# Patient Record
Sex: Male | Born: 1960 | Race: Black or African American | Hispanic: No | Marital: Married | Smoking: Never smoker
Health system: Southern US, Community
[De-identification: ages and names within clinical notes are randomized; demographics above are authoritative.]

## PROBLEM LIST (undated history)

## (undated) DIAGNOSIS — I10 Essential (primary) hypertension: Secondary | ICD-10-CM

## (undated) DIAGNOSIS — G4730 Sleep apnea, unspecified: Secondary | ICD-10-CM

## (undated) DIAGNOSIS — F32A Depression, unspecified: Secondary | ICD-10-CM

## (undated) DIAGNOSIS — K625 Hemorrhage of anus and rectum: Secondary | ICD-10-CM

## (undated) DIAGNOSIS — I639 Cerebral infarction, unspecified: Secondary | ICD-10-CM

## (undated) DIAGNOSIS — K219 Gastro-esophageal reflux disease without esophagitis: Secondary | ICD-10-CM

## (undated) DIAGNOSIS — F419 Anxiety disorder, unspecified: Secondary | ICD-10-CM

## (undated) DIAGNOSIS — C801 Malignant (primary) neoplasm, unspecified: Secondary | ICD-10-CM

## (undated) DIAGNOSIS — K626 Ulcer of anus and rectum: Secondary | ICD-10-CM

## (undated) DIAGNOSIS — F329 Major depressive disorder, single episode, unspecified: Secondary | ICD-10-CM

## (undated) HISTORY — PX: HERNIA REPAIR: SHX51

---

## 2011-08-27 ENCOUNTER — Other Ambulatory Visit: Payer: Self-pay | Admitting: Medical

## 2013-06-09 ENCOUNTER — Ambulatory Visit (HOSPITAL_COMMUNITY)
Admission: RE | Admit: 2013-06-09 | Discharge: 2013-06-09 | Disposition: A | Discharge status: Home / Self Care | Payer: BC Managed Care – PPO | Source: Ambulatory Visit | Attending: Family Medicine | Admitting: Family Medicine

## 2013-06-09 ENCOUNTER — Other Ambulatory Visit (HOSPITAL_COMMUNITY): Payer: Self-pay | Admitting: Family Medicine

## 2013-06-09 DIAGNOSIS — M545 Low back pain: Secondary | ICD-10-CM

## 2013-06-09 DIAGNOSIS — M5137 Other intervertebral disc degeneration, lumbosacral region: Secondary | ICD-10-CM | POA: Diagnosis not present

## 2013-06-09 DIAGNOSIS — M51379 Other intervertebral disc degeneration, lumbosacral region without mention of lumbar back pain or lower extremity pain: Secondary | ICD-10-CM | POA: Insufficient documentation

## 2013-06-09 DIAGNOSIS — M47817 Spondylosis without myelopathy or radiculopathy, lumbosacral region: Secondary | ICD-10-CM | POA: Diagnosis not present

## 2014-08-11 DIAGNOSIS — C801 Malignant (primary) neoplasm, unspecified: Secondary | ICD-10-CM

## 2014-08-11 HISTORY — DX: Malignant (primary) neoplasm, unspecified: C80.1

## 2016-05-07 ENCOUNTER — Other Ambulatory Visit: Payer: Self-pay | Admitting: Urology

## 2016-05-07 ENCOUNTER — Ambulatory Visit (INDEPENDENT_AMBULATORY_CARE_PROVIDER_SITE_OTHER): Payer: No Typology Code available for payment source | Admitting: Urology

## 2016-05-07 DIAGNOSIS — R972 Elevated prostate specific antigen [PSA]: Secondary | ICD-10-CM

## 2016-05-07 DIAGNOSIS — R350 Frequency of micturition: Secondary | ICD-10-CM

## 2016-05-28 ENCOUNTER — Encounter (HOSPITAL_COMMUNITY): Payer: Self-pay

## 2016-05-28 ENCOUNTER — Ambulatory Visit (HOSPITAL_COMMUNITY)
Admission: RE | Admit: 2016-05-28 | Discharge: 2016-05-28 | Disposition: A | Discharge status: Home / Self Care | Payer: No Typology Code available for payment source | Source: Ambulatory Visit | Attending: Urology | Admitting: Urology

## 2016-05-28 DIAGNOSIS — R972 Elevated prostate specific antigen [PSA]: Secondary | ICD-10-CM | POA: Insufficient documentation

## 2016-05-28 DIAGNOSIS — C61 Malignant neoplasm of prostate: Secondary | ICD-10-CM | POA: Diagnosis not present

## 2016-05-28 HISTORY — DX: Depression, unspecified: F32.A

## 2016-05-28 HISTORY — DX: Anxiety disorder, unspecified: F41.9

## 2016-05-28 HISTORY — DX: Major depressive disorder, single episode, unspecified: F32.9

## 2016-05-28 HISTORY — DX: Essential (primary) hypertension: I10

## 2016-05-28 MED ORDER — LIDOCAINE HCL (PF) 2 % IJ SOLN
10.0000 mL | Freq: Once | INTRAMUSCULAR | Status: AC
  Administered 2016-05-28: 10 mL

## 2016-05-28 MED ORDER — LIDOCAINE HCL (PF) 2 % IJ SOLN
INTRAMUSCULAR | Status: AC
  Administered 2016-05-28: 10 mL
  Filled 2016-05-28: qty 10

## 2016-05-28 MED ORDER — GENTAMICIN SULFATE 40 MG/ML IJ SOLN
80.0000 mg | Freq: Once | INTRAMUSCULAR | Status: AC
Start: 2016-05-28 — End: 2016-05-28
  Administered 2016-05-28: 80 mg via INTRAMUSCULAR

## 2016-05-28 MED ORDER — GENTAMICIN SULFATE 40 MG/ML IJ SOLN
INTRAMUSCULAR | Status: AC
  Administered 2016-05-28: 80 mg via INTRAMUSCULAR
  Filled 2016-05-28: qty 2

## 2016-05-28 NOTE — Discharge Instructions (Signed)
Transrectal Ultrasound-Guided Biopsy °A transrectal ultrasound-guided biopsy is a procedure to take samples of tissue from your prostate. Ultrasound images are used to guide the procedure. It is usually done to check the prostate gland for cancer. °BEFORE THE PROCEDURE °· Do not eat or drink after midnight on the night before your procedure. °· Take medicines as your doctor tells you. °· Your doctor may have you stop taking some medicines 5-7 days before the procedure. °· You will be given an enema before your procedure. During an enema, a liquid is put into your butt (rectum) to clear out waste. °· You may have lab tests the day of your procedure. °· Make plans to have someone drive you home. °PROCEDURE °· You will be given medicine to help you relax before the procedure. An IV tube will be put into one of your veins. It will be used to give fluids and medicine. °· You will be given medicine to reduce the risk of infection (antibiotic). °· You will be placed on your side. °· A probe with gel will be put in your butt. This is used to take pictures of your prostate and the area around it. °· A medicine to numb the area is put into your prostate. °· A biopsy needle is then inserted and guided to your prostate. °· Samples of prostate tissue are taken. The needle is removed. °· The samples are sent to a lab to be checked. Results are usually back in 2-3 days. °AFTER THE PROCEDURE °· You will be taken to a room where you will be watched until you are doing okay. °· You may have some pain in the area around your butt. You will be given medicines for this. °· You may be able to go home the same day. Sometimes, an overnight stay in the hospital is needed. °  °This information is not intended to replace advice given to you by your health care provider. Make sure you discuss any questions you have with your health care provider. °  °Document Released: 07/16/2009 Document Revised: 08/02/2013 Document Reviewed:  03/16/2013 °Elsevier Interactive Patient Education ©2016 [employer] ° °

## 2016-06-18 ENCOUNTER — Ambulatory Visit (INDEPENDENT_AMBULATORY_CARE_PROVIDER_SITE_OTHER): Payer: No Typology Code available for payment source | Admitting: Urology

## 2016-06-18 DIAGNOSIS — C61 Malignant neoplasm of prostate: Secondary | ICD-10-CM

## 2016-07-10 ENCOUNTER — Encounter: Payer: Self-pay | Admitting: Radiation Oncology

## 2016-07-16 ENCOUNTER — Ambulatory Visit: Payer: Disability Insurance

## 2016-07-16 ENCOUNTER — Ambulatory Visit: Payer: Disability Insurance | Admitting: Radiation Oncology

## 2016-07-16 ENCOUNTER — Ambulatory Visit: Payer: No Typology Code available for payment source | Admitting: Urology

## 2016-08-19 NOTE — H&P (Signed)
[facility] Surgical History and Physical  Subjective: 56 year old male returns for follow-up and to discuss repair of Right inguinal hernia. Patient reports that the hernia has not been causing much more pain since he first noticed it ~6 months ago, but he's noticed he is more frequently having to reduce his often larger hernia. He has also met with a radiation oncologist since his last visit who advised patient to have his large Right inguinal hernia repaired prior to starting radiation to minimize unecessary radiation to any small intestine contained in his hernia. Patient denies ever not being able to reduce the hernia and denies any significant Left groin pain or bulge. He also denies any N/V or decreased bowel function attributable to his Right inguinal hernia, constipation, straining with BM or urination (despite recent diagnosis of prostate cancer), and denies much heavy lifting. *  Review of Symptoms:  Constitutional:No fevers, chills, or unexplained weight loss Head:Atraumatic; no masses; no abnormalities Eyes:No visual changes or eye pain Cardiovascular: No chest pain or palpitations.  Respiratory:No cough, shortness of breath or wheezing  GastrointestinNo diarrhea, constipation, blood in stools, abdominal pain, vomiting or heartburn Genitourinary:No urinary frequency, hematuria, incontinence, or dysuria Musculoskeletal:No arthalgias, myalgias or joint swelling  Past Medical History:ObtainedReviewed  Past Medical History  Medical Problems:  HTN, HLD, GERD, chronic back and B/L foot pain Psychiatric History:  PTSD ([employer] veteran) Allergies:   NKDA Medications:   aspirin 81 mg daily, lisinopril, amlodipine, atorvastatin, omeprazole, lorasidone, viagra prn, clonazepam prn, prazosin prn   Social History:ObtainedReviewed  Social History  Preferred Language: English Race:  Black or African American Ethnicity: Not Hispanic / Latino Age: 56 year Marital Status:  S Alcohol:   occassional  Smoking Status: Never smoker reviewed on 06/04/2016  Functional Status ------------------------------------------------ Bathing: Normal Cooking: Normal Dressing: Normal Driving: Normal Eating: Normal Managing Meds: Normal Oral Care: Normal Shopping: Normal Toileting: Normal Transferring: Normal Walking: Normal  Cognitive Status ------------------------------------------------ Attention: Normal Decision Making: Normal Language: Normal Memory: Normal Motor: Normal Perception: Normal Problem Solving: Normal Visual and Spatial: Normal   Family History:ObtainedReviewed  Family Health History      Unknown  Vital Signs as of: 11/12/345:  Systolic 425: Diastolic 92: Heart Rate 94: Temp 99.68F (Temporal) Height 55f 0in: Weight 214Lbs 0 Ounces: BMI 29.02 kg/m2  Physical Exam: General:Well appearing, well nourished in no distress. Head:Atraumatic; no masses; no abnormalities Eyes:conjunctiva clear, EOM intact, PERRL Heart:RRR, no murmur Lungs:CTA bilaterally, no wheezes, rhonchi, rales.  Breathing unlabored. Abdomen:Soft, NT/ND, no HSM, no masses, R >> L easiliy reducible inguinal hernias NT Extremities:No deformities, clubbing, cyanosis, or edema  Assessment: [age]year old Male with increasingly symptomatic easily reducible Right >> left inguinal hernias, complicated by recently diagnosed prostate cancer requiring near-future radiotherapy, generalized anxiety disorder with PTSD for whom he sees a psychiatrist, HTN, HLD, and GERD.  Plan:      - all risks, benefits, and alternatives to Right inguinal hernia repair with mesh discussed with patient, who expresses he would like to proceed, and informed consent was obtained      - will plan for open repair of Right inguinal hernia with mesh next week as per OR schedule      - agree with radiotherapy to follow ~4 weeks after hernia repair, but will defer to oncologist(s)      - post-surgical follow-up 2  weeks after planned procedure      - patient advised to call if any questions/concerns  -- JCorene CorneaE. DRosana Hoes MD, [place]  [facility] General Surgery [facility] Office #: [phone]

## 2016-08-19 NOTE — Patient Instructions (Addendum)
TEL CATTON  08/19/2016     @PREFPERIOPPHARMACY @   Your procedure is scheduled on   08/28/2016   Report to [facility] at  730  A.M.  Call this number if you have problems the morning of surgery:  [phone]   Remember:  Do not eat food or drink liquids after midnight.  Take these medicines the morning of surgery with A SIP OF WATER  : Amlodipine, Lisinopril, Metoprolol, Omeprazole and Cymbalta  Do not wear jewelry, make-up or nail polish.  Do not wear lotions, powders, or perfumes, or deoderant.  Do not shave 48 hours prior to surgery.  Men may shave face and neck.  Do not bring valuables to the hospital.  [facility] is not responsible for any belongings or valuables.  Contacts, dentures or bridgework may not be worn into surgery.  Leave your suitcase in the car.  After surgery it may be brought to your room.  For patients admitted to the hospital, discharge time will be determined by your treatment team.  Patients discharged the day of surgery will not be allowed to drive home.   Name and phone number of your driver:   family Special instructions:  none  Please read over the following fact sheets that you were given. Anesthesia Post-op Instructions and Care and Recovery After Surgery       Open Hernia Repair, Adult Open hernia repair is a surgical procedure to fix a hernia. A hernia occurs when an internal organ or tissue pushes out through a weak spot in the abdominal wall muscles. Hernias commonly occur in the groin and around the navel. Most hernias tend to get worse over time. Often, surgery is done to prevent the hernia from becoming bigger, uncomfortable, or an emergency. Emergency surgery may be needed if abdominal contents get stuck in the opening (incarcerated hernia) or the blood supply gets cut off (strangulated hernia). In an open repair, an incision is made in the abdomen to perform the surgery. Tell a health care provider about:  Any  allergies you have.  All medicines you are taking, including vitamins, herbs, eye drops, creams, and over-the-counter medicines.  Any problems you or family members have had with anesthetic medicines.  Any blood or bone disorders you have.  Any surgeries you have had.  Any medical conditions you have, including any recent cold or flu symptoms.  Whether you are pregnant or may be pregnant. What are the risks? Generally, this is a safe procedure. However, problems may occur, including:  Long-lasting (chronic) pain.  Bleeding.  Infection.  Damage to the testicle. This can cause shrinking or swelling.  Damage to the bladder, blood vessels, intestine, or nerves near the hernia.  Trouble passing urine.  Allergic reactions to medicines.  Return of the hernia. What happens before the procedure? Staying hydrated  Follow instructions from your health care provider about hydration, which may include:  Up to 2 hours before the procedure - you may continue to drink clear liquids, such as water, clear fruit juice, black coffee, and plain tea. Eating and drinking restrictions  Follow instructions from your health care provider about eating and drinking, which may include:  8 hours before the procedure - stop eating heavy meals or foods such as meat, fried foods, or fatty foods.  6 hours before the procedure - stop eating light meals or foods, such as toast or cereal.  6 hours before the procedure - stop drinking milk  or drinks that contain milk.  2 hours before the procedure - stop drinking clear liquids. Medicines  Ask your health care provider about:  Changing or stopping your regular medicines. This is especially important if you are taking diabetes medicines or blood thinners.  Taking medicines such as aspirin and ibuprofen. These medicines can thin your blood. Do not take these medicines before your procedure if your health care provider instructs you not to.  You may be  given antibiotic medicine to help prevent infection. General instructions  You may have blood tests or imaging studies.  Ask your health care provider how your surgical site will be marked or identified.  If you smoke, do not smoke for at least 2 weeks before your procedure or for as long as told by your health care provider.  Let your health care provider know if you develop a cold or any infection before your surgery.  Plan to have someone take you home from the hospital or clinic.  If you will be going home right after the procedure, plan to have someone with you for 24 hours. What happens during the procedure?  To reduce your risk of infection:  Your health care team will wash or sanitize their hands.  Your skin will be washed with soap.  Hair may be removed from the surgical area.  An IV tube will be inserted into one of your veins.  You will be given one or more of the following:  A medicine to help you relax (sedative).  A medicine to numb the area (local anesthetic).  A medicine to make you fall asleep (general anesthetic).  Your surgeon will make an incision over the hernia.  The tissues of the hernia will be moved back into place.  The edges of the hernia may be stitched together.  The opening in the abdominal muscles will be closed with stitches (sutures). Or, your surgeon will place a mesh patch made of manmade (synthetic) material over the opening.  The incision will be closed.  A bandage (dressing) may be placed over the incision. The procedure may vary among health care providers and hospitals. What happens after the procedure?  Your blood pressure, heart rate, breathing rate, and blood oxygen level will be monitored until the medicines you were given have worn off.  You may be given medicine for pain.  Do not drive for 24 hours if you received a sedative. This information is not intended to replace advice given to you by your health care provider.  Make sure you discuss any questions you have with your health care provider. Document Released: 01/21/2001 Document Revised: 02/15/2016 Document Reviewed: 01/09/2016 Elsevier Interactive Patient Education  2017 [employer] Repair, Adult, Care After These instructions give you information about caring for yourself after your procedure. Your doctor may also give you more specific instructions. If you have problems or questions, contact your doctor. Follow these instructions at home: Surgical cut (incision) care   Follow instructions from your doctor about how to take care of your surgical cut area. Make sure you:  Wash your hands with soap and water before you change your bandage (dressing). If you cannot use soap and water, use hand sanitizer.  Change your bandage as told by your doctor.  Leave stitches (sutures), skin glue, or skin tape (adhesive) strips in place. They may need to stay in place for 2 weeks or longer. If tape strips get loose and curl up, you may trim the loose  edges. Do not remove tape strips completely unless your doctor says it is okay.  Check your surgical cut every day for signs of infection. Check for:  More redness, swelling, or pain.  More fluid or blood.  Warmth.  Pus or a bad smell. Activity  Do not drive or use heavy machinery while taking prescription pain medicine. Do not drive until your doctor says it is okay.  Until your doctor says it is okay:  Do not lift anything that is heavier than 10 lb (4.5 kg).  Do not play contact sports.  Return to your normal activities as told by your doctor. Ask your doctor what activities are safe. General instructions  To prevent or treat having a hard time pooping (constipation) while you are taking prescription pain medicine, your doctor may recommend that you:  Drink enough fluid to keep your pee (urine) clear or pale yellow.  Take over-the-counter or prescription medicines.  Eat foods that  are high in fiber, such as fresh fruits and vegetables, whole grains, and beans.  Limit foods that are high in fat and processed sugars, such as fried and sweet foods.  Take over-the-counter and prescription medicines only as told by your doctor.  Do not take baths, swim, or use a hot tub until your doctor says it is okay.  Keep all follow-up visits as told by your doctor. This is important. Contact a doctor if:  You develop a rash.  You have more redness, swelling, or pain around your surgical cut.  You have more fluid or blood coming from your surgical cut.  Your surgical cut feels warm to the touch.  You have pus or a bad smell coming from your surgical cut.  You have a fever or chills.  You have blood in your poop (stool).  You have not pooped in 2-3 days.  Medicine does not help your pain. Get help right away if:  You have chest pain or you are short of breath.  You feel light-headed.  You feel weak and dizzy (feel faint).  You have very bad pain.  You throw up (vomit) and your pain is worse. This information is not intended to replace advice given to you by your health care provider. Make sure you discuss any questions you have with your health care provider. Document Released: 08/18/2014 Document Revised: 02/15/2016 Document Reviewed: 01/09/2016 Elsevier Interactive Patient Education  2017 [employer] PATIENT INSTRUCTIONS POST-ANESTHESIA  IMMEDIATELY FOLLOWING SURGERY:  Do not drive or operate machinery for the first twenty four hours after surgery.  Do not make any important decisions for twenty four hours after surgery or while taking narcotic pain medications or sedatives.  If you develop intractable nausea and vomiting or a severe headache please notify your doctor immediately.  FOLLOW-UP:  Please make an appointment with your surgeon as instructed. You do not need to follow up with anesthesia unless specifically instructed to do so.  WOUND CARE  INSTRUCTIONS (if applicable):  Keep a dry clean dressing on the anesthesia/puncture wound site if there is drainage.  Once the wound has quit draining you may leave it open to air.  Generally you should leave the bandage intact for twenty four hours unless there is drainage.  If the epidural site drains for more than 36-48 hours please call the anesthesia department.  QUESTIONS?:  Please feel free to call your physician or the hospital operator if you have any questions, and they will be happy to assist you.

## 2016-08-22 ENCOUNTER — Encounter (HOSPITAL_COMMUNITY): Payer: Self-pay

## 2016-08-22 ENCOUNTER — Encounter (HOSPITAL_COMMUNITY)
Admission: RE | Admit: 2016-08-22 | Discharge: 2016-08-22 | Disposition: A | Discharge status: Home / Self Care | Payer: No Typology Code available for payment source | Source: Ambulatory Visit | Attending: Surgery | Admitting: Surgery

## 2016-08-22 DIAGNOSIS — Z8673 Personal history of transient ischemic attack (TIA), and cerebral infarction without residual deficits: Secondary | ICD-10-CM | POA: Diagnosis not present

## 2016-08-22 DIAGNOSIS — F419 Anxiety disorder, unspecified: Secondary | ICD-10-CM | POA: Diagnosis not present

## 2016-08-22 DIAGNOSIS — Z01810 Encounter for preprocedural cardiovascular examination: Secondary | ICD-10-CM | POA: Diagnosis not present

## 2016-08-22 DIAGNOSIS — Z01812 Encounter for preprocedural laboratory examination: Secondary | ICD-10-CM | POA: Diagnosis not present

## 2016-08-22 DIAGNOSIS — G4730 Sleep apnea, unspecified: Secondary | ICD-10-CM | POA: Diagnosis not present

## 2016-08-22 DIAGNOSIS — F329 Major depressive disorder, single episode, unspecified: Secondary | ICD-10-CM | POA: Diagnosis not present

## 2016-08-22 DIAGNOSIS — I10 Essential (primary) hypertension: Secondary | ICD-10-CM | POA: Insufficient documentation

## 2016-08-22 HISTORY — DX: Cerebral infarction, unspecified: I63.9

## 2016-08-22 HISTORY — DX: Sleep apnea, unspecified: G47.30

## 2016-08-22 LAB — BASIC METABOLIC PANEL
Anion gap: 9 (ref 5–15)
BUN: 13 mg/dL (ref 6–20)
CALCIUM: 9.4 mg/dL (ref 8.9–10.3)
CO2: 22 mmol/L (ref 22–32)
CREATININE: 1.21 mg/dL (ref 0.61–1.24)
Chloride: 106 mmol/L (ref 101–111)
GFR calc Af Amer: >60 mL/min (ref >60)
GLUCOSE: 103 mg/dL — AB (ref 65–99)
Potassium: 3.5 mmol/L (ref 3.5–5.1)
Sodium: 137 mmol/L (ref 135–145)

## 2016-08-22 LAB — CBC WITH DIFFERENTIAL/PLATELET
BASOS PCT: 0 %
Basophils Absolute: 0 10*3/uL (ref 0.0–0.1)
EOS ABS: 0.2 10*3/uL (ref 0.0–0.7)
EOS PCT: 2 %
HEMATOCRIT: 42.7 % (ref 39.0–52.0)
Hemoglobin: 14.8 g/dL (ref 13.0–17.0)
Lymphocytes Relative: 28 %
Lymphs Abs: 1.9 10*3/uL (ref 0.7–4.0)
MCH: 34.8 pg — ABNORMAL HIGH (ref 26.0–34.0)
MCHC: 34.7 g/dL (ref 30.0–36.0)
MCV: 100.5 fL — ABNORMAL HIGH (ref 78.0–100.0)
MONO ABS: 0.7 10*3/uL (ref 0.1–1.0)
MONOS PCT: 10 %
Neutro Abs: 3.9 10*3/uL (ref 1.7–7.7)
Neutrophils Relative %: 60 %
PLATELETS: 186 10*3/uL (ref 150–400)
RBC: 4.25 MIL/uL (ref 4.22–5.81)
RDW: 12.6 % (ref 11.5–15.5)
WBC: 6.6 10*3/uL (ref 4.0–10.5)

## 2016-08-25 ENCOUNTER — Encounter (HOSPITAL_COMMUNITY): Payer: Self-pay

## 2016-08-28 ENCOUNTER — Ambulatory Visit (HOSPITAL_COMMUNITY)
Admission: RE | Admit: 2016-08-28 | Payer: No Typology Code available for payment source | Source: Ambulatory Visit | Admitting: Surgery

## 2016-08-28 ENCOUNTER — Encounter (HOSPITAL_COMMUNITY): Admission: RE | Payer: Self-pay | Source: Ambulatory Visit

## 2016-08-28 SURGERY — REPAIR, HERNIA, INGUINAL, ADULT
Anesthesia: General | Laterality: Right

## 2016-11-19 ENCOUNTER — Other Ambulatory Visit: Payer: Self-pay | Admitting: Urology

## 2016-11-19 DIAGNOSIS — C61 Malignant neoplasm of prostate: Secondary | ICD-10-CM

## 2016-12-24 ENCOUNTER — Ambulatory Visit (HOSPITAL_COMMUNITY)
Admission: RE | Admit: 2016-12-24 | Discharge: 2016-12-24 | Disposition: A | Discharge status: Home / Self Care | Payer: No Typology Code available for payment source | Source: Ambulatory Visit | Attending: Urology | Admitting: Urology

## 2016-12-24 DIAGNOSIS — C61 Malignant neoplasm of prostate: Secondary | ICD-10-CM | POA: Diagnosis not present

## 2016-12-24 MED ORDER — LIDOCAINE HCL (PF) 2 % IJ SOLN
10.0000 mL | Freq: Once | INTRAMUSCULAR | Status: AC
  Administered 2016-12-24: 10 mL

## 2016-12-24 MED ORDER — GENTAMICIN SULFATE 40 MG/ML IJ SOLN
INTRAMUSCULAR | Status: AC
  Administered 2016-12-24: 80 mg via INTRAMUSCULAR
  Filled 2016-12-24: qty 2

## 2016-12-24 MED ORDER — GENTAMICIN SULFATE 40 MG/ML IJ SOLN
80.0000 mg | Freq: Once | INTRAMUSCULAR | Status: AC
Start: 2016-12-24 — End: 2016-12-24
  Administered 2016-12-24: 80 mg via INTRAMUSCULAR

## 2016-12-24 MED ORDER — LIDOCAINE HCL (PF) 2 % IJ SOLN
INTRAMUSCULAR | Status: AC
  Administered 2016-12-24: 10 mL
  Filled 2016-12-24: qty 10

## 2017-06-17 ENCOUNTER — Ambulatory Visit (INDEPENDENT_AMBULATORY_CARE_PROVIDER_SITE_OTHER): Payer: Non-veteran care | Admitting: Urology

## 2017-06-17 DIAGNOSIS — R351 Nocturia: Secondary | ICD-10-CM | POA: Diagnosis not present

## 2017-06-17 DIAGNOSIS — C61 Malignant neoplasm of prostate: Secondary | ICD-10-CM | POA: Diagnosis not present

## 2017-09-23 ENCOUNTER — Ambulatory Visit: Payer: Non-veteran care | Admitting: Urology

## 2018-07-07 ENCOUNTER — Encounter (HOSPITAL_COMMUNITY): Payer: Self-pay | Admitting: General Practice

## 2018-07-07 ENCOUNTER — Inpatient Hospital Stay (HOSPITAL_COMMUNITY)
Admission: AD | Admit: 2018-07-07 | Discharge: 2018-07-11 | DRG: 378 | Disposition: A | Discharge status: Home / Self Care | Payer: No Typology Code available for payment source | Source: Other Acute Inpatient Hospital | Attending: Internal Medicine | Admitting: Internal Medicine

## 2018-07-07 ENCOUNTER — Observation Stay (HOSPITAL_COMMUNITY): Payer: No Typology Code available for payment source

## 2018-07-07 ENCOUNTER — Other Ambulatory Visit: Payer: Self-pay

## 2018-07-07 DIAGNOSIS — D500 Iron deficiency anemia secondary to blood loss (chronic): Secondary | ICD-10-CM | POA: Diagnosis not present

## 2018-07-07 DIAGNOSIS — K627 Radiation proctitis: Secondary | ICD-10-CM | POA: Diagnosis not present

## 2018-07-07 DIAGNOSIS — F419 Anxiety disorder, unspecified: Secondary | ICD-10-CM | POA: Diagnosis present

## 2018-07-07 DIAGNOSIS — Z923 Personal history of irradiation: Secondary | ICD-10-CM

## 2018-07-07 DIAGNOSIS — K625 Hemorrhage of anus and rectum: Principal | ICD-10-CM | POA: Diagnosis present

## 2018-07-07 DIAGNOSIS — Z8546 Personal history of malignant neoplasm of prostate: Secondary | ICD-10-CM | POA: Diagnosis not present

## 2018-07-07 DIAGNOSIS — R000 Tachycardia, unspecified: Secondary | ICD-10-CM | POA: Diagnosis not present

## 2018-07-07 DIAGNOSIS — Z7982 Long term (current) use of aspirin: Secondary | ICD-10-CM

## 2018-07-07 DIAGNOSIS — R066 Hiccough: Secondary | ICD-10-CM | POA: Diagnosis present

## 2018-07-07 DIAGNOSIS — R509 Fever, unspecified: Secondary | ICD-10-CM | POA: Diagnosis present

## 2018-07-07 DIAGNOSIS — E876 Hypokalemia: Secondary | ICD-10-CM | POA: Diagnosis present

## 2018-07-07 DIAGNOSIS — I10 Essential (primary) hypertension: Secondary | ICD-10-CM | POA: Diagnosis present

## 2018-07-07 DIAGNOSIS — Z8673 Personal history of transient ischemic attack (TIA), and cerebral infarction without residual deficits: Secondary | ICD-10-CM

## 2018-07-07 DIAGNOSIS — C61 Malignant neoplasm of prostate: Secondary | ICD-10-CM | POA: Diagnosis not present

## 2018-07-07 DIAGNOSIS — G4730 Sleep apnea, unspecified: Secondary | ICD-10-CM | POA: Diagnosis present

## 2018-07-07 DIAGNOSIS — K922 Gastrointestinal hemorrhage, unspecified: Secondary | ICD-10-CM | POA: Diagnosis not present

## 2018-07-07 DIAGNOSIS — Z79899 Other long term (current) drug therapy: Secondary | ICD-10-CM

## 2018-07-07 DIAGNOSIS — K921 Melena: Secondary | ICD-10-CM | POA: Diagnosis present

## 2018-07-07 DIAGNOSIS — K626 Ulcer of anus and rectum: Secondary | ICD-10-CM | POA: Diagnosis not present

## 2018-07-07 DIAGNOSIS — F329 Major depressive disorder, single episode, unspecified: Secondary | ICD-10-CM | POA: Diagnosis not present

## 2018-07-07 DIAGNOSIS — K219 Gastro-esophageal reflux disease without esophagitis: Secondary | ICD-10-CM | POA: Diagnosis present

## 2018-07-07 DIAGNOSIS — E785 Hyperlipidemia, unspecified: Secondary | ICD-10-CM | POA: Diagnosis not present

## 2018-07-07 DIAGNOSIS — K6289 Other specified diseases of anus and rectum: Secondary | ICD-10-CM

## 2018-07-07 HISTORY — DX: Hemorrhage of anus and rectum: K62.5

## 2018-07-07 HISTORY — DX: Gastro-esophageal reflux disease without esophagitis: K21.9

## 2018-07-07 LAB — COMPREHENSIVE METABOLIC PANEL
ALT: 15 U/L (ref 0–44)
ANION GAP: 7 (ref 5–15)
AST: 21 U/L (ref 15–41)
Albumin: 2.8 g/dL — ABNORMAL LOW (ref 3.5–5.0)
Alkaline Phosphatase: 89 U/L (ref 38–126)
BILIRUBIN TOTAL: 0.9 mg/dL (ref 0.3–1.2)
BUN: 5 mg/dL — ABNORMAL LOW (ref 6–20)
CHLORIDE: 109 mmol/L (ref 98–111)
CO2: 20 mmol/L — ABNORMAL LOW (ref 22–32)
Calcium: 8.7 mg/dL — ABNORMAL LOW (ref 8.9–10.3)
Creatinine, Ser: 1.04 mg/dL (ref 0.61–1.24)
Glucose, Bld: 116 mg/dL — ABNORMAL HIGH (ref 70–99)
POTASSIUM: 3.6 mmol/L (ref 3.5–5.1)
Sodium: 136 mmol/L (ref 135–145)
TOTAL PROTEIN: 6.7 g/dL (ref 6.5–8.1)

## 2018-07-07 LAB — CBC
HEMATOCRIT: 33.7 % — AB (ref 39.0–52.0)
Hemoglobin: 11 g/dL — ABNORMAL LOW (ref 13.0–17.0)
MCH: 35.6 pg — AB (ref 26.0–34.0)
MCHC: 32.6 g/dL (ref 30.0–36.0)
MCV: 109.1 fL — AB (ref 80.0–100.0)
NRBC: 0 % (ref 0.0–0.2)
Platelets: 231 10*3/uL (ref 150–400)
RBC: 3.09 MIL/uL — ABNORMAL LOW (ref 4.22–5.81)
RDW: 12.1 % (ref 11.5–15.5)
WBC: 14.1 10*3/uL — ABNORMAL HIGH (ref 4.0–10.5)

## 2018-07-07 MED ORDER — OXYCODONE HCL 5 MG PO TABS
5.0000 mg | ORAL_TABLET | ORAL | Status: DC | PRN
  Administered 2018-07-07 – 2018-07-11 (×9): 5 mg via ORAL
  Filled 2018-07-07 (×10): qty 1

## 2018-07-07 MED ORDER — CIPROFLOXACIN IN D5W 400 MG/200ML IV SOLN
400.0000 mg | Freq: Two times a day (BID) | INTRAVENOUS | Status: DC
  Administered 2018-07-07 – 2018-07-11 (×8): 400 mg via INTRAVENOUS
  Filled 2018-07-07 (×8): qty 200

## 2018-07-07 MED ORDER — ENSURE ENLIVE PO LIQD
237.0000 mL | Freq: Two times a day (BID) | ORAL | Status: DC
  Administered 2018-07-07 – 2018-07-08 (×2): 237 mL via ORAL

## 2018-07-07 MED ORDER — ACETAMINOPHEN 325 MG PO TABS: 650.0000 mg | ORAL_TABLET | Freq: Four times a day (QID) | ORAL | Status: DC | PRN

## 2018-07-07 MED ORDER — METRONIDAZOLE IN NACL 5-0.79 MG/ML-% IV SOLN
500.0000 mg | Freq: Three times a day (TID) | INTRAVENOUS | Status: DC
  Administered 2018-07-07 – 2018-07-11 (×11): 500 mg via INTRAVENOUS
  Filled 2018-07-07 (×12): qty 100

## 2018-07-07 NOTE — Progress Notes (Signed)
Pt admitted to the the [place] room 9. Pt  oriented to the room, pt in bed at this time call bell place within reach. skin assessment completed.

## 2018-07-07 NOTE — H&P (Addendum)
History and Physical    Phillip Blackwell XVQ:[id number] DOB: 1960-08-28 DOA: 07/07/2018  I have briefly reviewed the patient's prior medical records in [place]  PCP: System, Pcp Not In  Patient coming from: [facility]   Chief Complaint: blood in stool  HPI: Phillip Blackwell is a 57 y.o. male with medical history significant of prostate cancer status post radiation therapy completed in August 2018, hypertension, hyperlipidemia, who presents to the hospital with chief complaint of bright red blood per rectum.  Patient tells me that he has had a history of seeing blood in his stool since February 2019.  He discussed this with his oncologist and he was told that he may have radiation injury and eventually will subside.  It did not subside and in fact it progressed throughout 2019, and for the past couple of days he has experienced large amounts of blood every time he goes to the bathroom.  Most recent bleeding episode yesterday was accompanied by significant lightheadedness and dizziness and that made him seek emergency care.  He initially went to [facility], and he was transferred here for gastroenterology consultation.  He underwent a CT scan of the abdomen and pelvis which did not show any significant lymphadenopathy, it did show mild prostatic enlargement but no significant other abnormalities.  Currently patient has no abdominal pain, no nausea or vomiting, he denies any chest pain or shortness of breath.  Denies any fever or chills.  Denies any lightheadedness or dizziness.  He also tells me that he has had slight weight loss but he is not sure how much over the last couple of months.  Flexible sigmoidoscopy performed last week, findings: "Rectal exam revealed for masslike tissue on palpation.  Upon entry, the into the rectum was found to be necrotic, deep and ulcerated extending from the dentate line to 7 cm from the insertion point.  Query radiation changes versus recurrence of  prostate cancer versus rectal primary.  This was not biopsied given the extensive ulceration and concern for possible perforation.  Conclusion of the report was that patient has a large necrotic ulcerated lesion in the distal rectum concern for malignancy versus radiation proctitis.  Review of Systems: As per HPI otherwise 10 point review of systems negative.   Past Medical History:  Diagnosis Date  . Anxiety   . Depression   . GERD (gastroesophageal reflux disease)   . Hypertension   . Rectal bleeding 07/07/2018  . Sleep apnea   . Stroke [facility])    2014    Past Surgical History:  Procedure Laterality Date  . HERNIA REPAIR       reports that he has never smoked. He has never used smokeless tobacco. He reports that he does not drink alcohol or use drugs.  No Known Allergies  History reviewed. No pertinent family history.  Prior to Admission medications   Medication Sig Start Date End Date Taking? Authorizing Provider  amLODipine (NORVASC) 10 MG tablet Take 10 mg by mouth daily.    [provider]  aspirin 81 MG chewable tablet Chew 81 mg by mouth daily.    [provider]  atorvastatin (LIPITOR) 40 MG tablet Take 40 mg by mouth daily.    [provider]  Cholecalciferol (VITAMIN D3) 2000 units TABS Take 1 tablet by mouth daily.    [provider]  clonazePAM (KLONOPIN) 1 MG tablet Take 1 mg by mouth every 8 (eight) hours as needed for anxiety.    [provider]  DULoxetine (CYMBALTA) 60 MG capsule Take 60 mg by mouth 2 (two) times daily.    [provider]  lisinopril (PRINIVIL,ZESTRIL) 40 MG tablet Take 40 mg by mouth daily.    [provider]  lurasidone (LATUDA) 40 MG TABS tablet Take 40 mg by mouth daily with breakfast.    [provider]  metoprolol (LOPRESSOR) 50 MG tablet Take 50 mg by mouth 2 (two) times daily.    [provider]  Multiple Vitamins-Minerals (MULTIVITAMIN WITH MINERALS) tablet  Take 1 tablet by mouth daily.    [provider]  omeprazole (PRILOSEC) 20 MG capsule Take 20 mg by mouth daily.    [provider]  prazosin (MINIPRESS) 1 MG capsule Take 3 mg by mouth at bedtime.    [provider]  sildenafil (VIAGRA) 100 MG tablet Take 100 mg by mouth daily as needed for erectile dysfunction.    [provider]    Physical Exam: Vitals:   07/07/18 1719  BP: (!) 149/100  Pulse: (!) 118  Resp: 20  Temp: (!) 101.6 F (38.7 C)  TempSrc: Oral  SpO2: 97%    Constitutional: no apparent disctress Eyes: PERRL, lids and conjunctivae normal ENMT: Mucous membranes are moist. Posterior pharynx clear of any exudate or lesions. Neck: normal, supple Respiratory: clear to auscultation bilaterally, no wheezing, no crackles. Normal respiratory effort. No accessory muscle use.  Cardiovascular: Regular rate and rhythm, no murmurs / rubs / gallops. No extremity edema. 2+ pedal pulses. Tachycardic  Abdomen: no tenderness, no masses palpated. Bowel sounds positive.  Musculoskeletal: no clubbing / cyanosis Skin: no rashes, lesions, ulcers. No induration Neurologic: CN 2-12 grossly intact. Strength 5/5 in all 4.  Psychiatric: Normal judgment and insight. Alert and oriented x 3. Normal mood.   Labs on Admission: I have personally reviewed following labs and imaging studies  CBC: No results for input(s): WBC, NEUTROABS, HGB, HCT, MCV, PLT in the last 168 hours. Basic Metabolic Panel: No results for input(s): NA, K, CL, CO2, GLUCOSE, BUN, CREATININE, CALCIUM, MG, PHOS in the last 168 hours. GFR: CrCl cannot be calculated (Patient's most recent lab result is older than the maximum 21 days allowed.). Liver Function Tests: No results for input(s): AST, ALT, ALKPHOS, BILITOT, PROT, ALBUMIN in the last 168 hours. No results for input(s): LIPASE, AMYLASE in the last 168 hours. No results for input(s): AMMONIA in the last 168 hours. Coagulation  Profile: No results for input(s): INR, PROTIME in the last 168 hours. Cardiac Enzymes: No results for input(s): CKTOTAL, CKMB, CKMBINDEX, TROPONINI in the last 168 hours. BNP (last 3 results) No results for input(s): PROBNP in the last 8760 hours. HbA1C: No results for input(s): HGBA1C in the last 72 hours. CBG: No results for input(s): GLUCAP in the last 168 hours. Lipid Profile: No results for input(s): CHOL, HDL, LDLCALC, TRIG, CHOLHDL, LDLDIRECT in the last 72 hours. Thyroid Function Tests: No results for input(s): TSH, T4TOTAL, FREET4, T3FREE, THYROIDAB in the last 72 hours. Anemia Panel: No results for input(s): VITAMINB12, FOLATE, FERRITIN, TIBC, IRON, RETICCTPCT in the last 72 hours. Urine analysis: No results found for: COLORURINE, APPEARANCEUR, LABSPEC, PHURINE, GLUCOSEU, HGBUR, BILIRUBINUR, KETONESUR, PROTEINUR, UROBILINOGEN, NITRITE, LEUKOCYTESUR   Radiological Exams on Admission: No results found.   Assessment/Plan Active Problems:   Lower GI bleed   HTN (hypertension)   HLD (hyperlipidemia)   Prostate cancer (HCC)   Lower GI bleed -Hemoglobin prior to transfer around 11 from a baseline of apparently ~  14, will repeat CBC this evening and repeat again tomorrow morning.  Given lightheadedness after his most recent bleed I will hold his antihypertensives -GI consulted, discussed with Dr. Cristina Gong, may need a repeat flexible sigmoidoscopy probably on Friday.   - Flex sig last week, as above, with findings of a large necrotic ulcerated lesion in the distal rectum concern for malignancy versus radiation proctitis.  Fever  -concern for proctitis, obtain blood cultures and start empiric antibiotics  Hypertension -hold home antihypertensives for tonight given GI bleed, fever and tachycardia   Hyperlipidemia -continue home statin  Prostate cancer -s/p XRT last year, outpatient follow up     DVT prophylaxis: SCDs  Code Status: Full code  Family Communication:  wife at bedside Disposition Plan: home when ready  Consults called: GI, Dr. Celso Sickle, MD, PhD [facility] Pager [phone]  If 7PM-7AM, please contact night-coverage [email or web address] Password [facility]  07/07/2018, 5:44 PM

## 2018-07-07 NOTE — Consult Note (Signed)
Referring Provider:  Dr. Wardell Heath Primary Care Physician:  System, Pcp Not In Primary Gastroenterologist:  [facility]  Reason for Consultation:  Rectal bleeding   HPI: Phillip Blackwell is a 58 y.o. male admitted through the emergency room this evening, in transfer from [facility] in [place], [place], because of accelerated rectal bleeding.  The patient completed external beam radiation therapy for prostate cancer in [place], [place] in August 2018.  He did fine for the first 6 months or so, but beginning in February of this year, he began to see periodic blood with his bowel movements, initially just streaking of blood but over time, increasing amounts of blood.  Both the frequency and the amount of blood have progressively increased, especially over the past several weeks.  He is now passing some dark red clots, and is sometimes passing blood even in the absence of a bowel movement.  He estimates he is passing blood about 6 or 8 times a day.  Along with this, he has had symptoms of rectal pain, and a sensation of pressure, and irregularity of bowel habit with alternating constipation and diarrhea, sometimes requiring a laxative.  6 days ago, he had sigmoidoscopy performed at the [facility] in [place], [place], which showed an anterior mass-effect in the distal rectum on digital exam, and a deeply ulcerated area with prominent margins.  This was not biopsied for fear of causing complications.  The exam was otherwise normal up to 30 cm.  I have examined the endoscopic photographs which the patient had at the bedside, and this looks like a very necrotic ulceration, corresponding in location to what would be expected for radiation proctitis.  The patient had a CT scan at [facility], which showed a "slightly prominent" prostate gland, no pelvic lymphadenopathy or bladder issues.  Hemoglobin was 10.9.   Past Medical History:  Diagnosis Date  . Anxiety   .  Depression   . GERD (gastroesophageal reflux disease)   . Hypertension   . Rectal bleeding 07/07/2018  . Sleep apnea   . Stroke [facility])    2014    Past Surgical History:  Procedure Laterality Date  . HERNIA REPAIR      Prior to Admission medications   Medication Sig Start Date End Date Taking? Authorizing Provider  amLODipine (NORVASC) 10 MG tablet Take 10 mg by mouth daily.    [provider]  aspirin 81 MG chewable tablet Chew 81 mg by mouth daily.    [provider]  atorvastatin (LIPITOR) 40 MG tablet Take 40 mg by mouth daily.    [provider]  Cholecalciferol (VITAMIN D3) 2000 units TABS Take 1 tablet by mouth daily.    [provider]  clonazePAM (KLONOPIN) 1 MG tablet Take 1 mg by mouth every 8 (eight) hours as needed for anxiety.    [provider]  DULoxetine (CYMBALTA) 60 MG capsule Take 60 mg by mouth 2 (two) times daily.    [provider]  lisinopril (PRINIVIL,ZESTRIL) 40 MG tablet Take 40 mg by mouth daily.    [provider]  lurasidone (LATUDA) 40 MG TABS tablet Take 40 mg by mouth daily with breakfast.    [provider]  metoprolol (LOPRESSOR) 50 MG tablet Take 50 mg by mouth 2 (two) times daily.    [provider]  Multiple Vitamins-Minerals (MULTIVITAMIN WITH MINERALS) tablet Take 1 tablet by mouth daily.    [provider]  omeprazole (PRILOSEC) 20 MG  capsule Take 20 mg by mouth daily.    [provider]  prazosin (MINIPRESS) 1 MG capsule Take 3 mg by mouth at bedtime.    [provider]  sildenafil (VIAGRA) 100 MG tablet Take 100 mg by mouth daily as needed for erectile dysfunction.    [provider]    Current Facility-Administered Medications  Medication Dose Route Frequency Provider Last Rate Last Dose  . acetaminophen (TYLENOL) tablet 650 mg  650 mg Oral Q6H PRN Caren Griffins, MD      . ciprofloxacin (CIPRO) IVPB 400 mg  400 mg  Intravenous Q12H Caren Griffins, MD 200 mL/hr at 07/07/18 1823 400 mg at 07/07/18 1823  . feeding supplement (ENSURE ENLIVE) (ENSURE ENLIVE) liquid 237 mL  237 mL Oral BID BM Caren Griffins, MD   237 mL at 07/07/18 1742  . metroNIDAZOLE (FLAGYL) IVPB 500 mg  500 mg Intravenous Q8H Caren Griffins, MD 100 mL/hr at 07/07/18 2037 500 mg at 07/07/18 2037  . oxyCODONE (Oxy IR/ROXICODONE) immediate release tablet 5 mg  5 mg Oral Q4H PRN Caren Griffins, MD   5 mg at 07/07/18 1815    Allergies as of 07/07/2018  . (No Known Allergies)    History reviewed. No pertinent family history.  Social History   Socioeconomic History  . Marital status: Married    Spouse name: Not on file  . Number of children: Not on file  . Years of education: Not on file  . Highest education level: Not on file  Occupational History  . Not on file  Social Needs  . Financial resource strain: Not on file  . Food insecurity:    Worry: Not on file    Inability: Not on file  . Transportation needs:    Medical: Not on file    Non-medical: Not on file  Tobacco Use  . Smoking status: Never Smoker  . Smokeless tobacco: Never Used  Substance and Sexual Activity  . Alcohol use: No  . Drug use: No  . Sexual activity: Yes  Lifestyle  . Physical activity:    Days per week: Not on file    Minutes per session: Not on file  . Stress: Not on file  Relationships  . Social connections:    Talks on phone: Not on file    Gets together: Not on file    Attends religious service: Not on file    Active member of club or organization: Not on file    Attends meetings of clubs or organizations: Not on file    Relationship status: Not on file  . Intimate partner violence:    Fear of current or ex partner: Not on file    Emotionally abused: Not on file    Physically abused: Not on file    Forced sexual activity: Not on file  Other Topics Concern  . Not on file  Social History Narrative  . Not on file    Review  of Systems: See history of present illness  Physical Exam: Vital signs in last 24 hours: Temp:  [101.6 F (38.7 C)] 101.6 F (38.7 C) (11/27 1719) Pulse Rate:  [118] 118 (11/27 1719) Resp:  [20] 20 (11/27 1719) BP: (149)/(100) 149/100 (11/27 1719) SpO2:  [97 %] 97 % (11/27 1719) Last BM Date: 07/06/18  This is a very pleasant, well-nourished, healthy-appearing African-American male in absolutely no distress.  Wife and daughter are at the bedside.  He is anicteric and without  significant conjunctival pallor.  Chest clear, heart normal, abdomen slightly adipose but without any mass or tenderness.  No scleral icterus, fully ambulatory, cognitively intact, no evident focal neurologic deficits.  Rectal exam shows no evident perianal disease, normal sphincter tone, and a firm nodular irregular mass-like fullness in the region of the prostate gland.  No stool is present.  There is no fecal impaction.  The examining finger has a scant amount of burgundy serosanguineous fluid on it, but no frank blood.  Intake/Output from previous day: No intake/output data recorded. Intake/Output this shift: No intake/output data recorded.  Lab Results: Recent Labs    07/07/18 1912  WBC 14.1*  HGB 11.0*  HCT 33.7*  PLT 231   BMET Recent Labs    07/07/18 1912  NA 136  K 3.6  CL 109  CO2 20*  GLUCOSE 116*  BUN 5*  CREATININE 1.04  CALCIUM 8.7*   LFT Recent Labs    07/07/18 1912  PROT 6.7  ALBUMIN 2.8*  AST 21  ALT 15  ALKPHOS 89  BILITOT 0.9   PT/INR No results for input(s): LABPROT, INR in the last 72 hours.  Studies/Results: Dg Chest Port 1 View  Result Date: 07/07/2018 CLINICAL DATA:  57 y/o  M; fever. EXAM: PORTABLE CHEST 1 VIEW COMPARISON:  None. FINDINGS: The heart size and mediastinal contours are within normal limits. Both lungs are clear. The visualized skeletal structures are unremarkable. IMPRESSION: No active disease. Electronically Signed   By: Kristine Garbe  M.D.   On: 07/07/2018 19:19    Impression: This is clearly an ulcerated, necrotic lesion accounting for the patient's rectal bleeding.  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  Plan: 1.  I will check an updated PSA level.  If it is sky high, that might support the idea of this being a necrotic extension of prostate cancer. 2.  I would recommend general surgical consultation for definitive management of this problem 3.  Diagnostically, I do not feel that there is any likely benefit to repeating the patient's sigmoidoscopic exam, since he had a recent sigmoidoscopy and he has the report in hand, with clear description and excellent endoscopic photographs. 4.  Therapeutically, I do not think that any endoscopic intervention would be of likely benefit, given the very necrotic nature of the patient's lesion.  I suppose if he had profuse emergent bleeding, we could use hemo-spray to temporarily stop the bleeding. 5.  I will sign off, because I do not think there is any further role for [place] with this patient, but feel free to call us back if further input from [place] would be helpful.   LOS: 1 day   Phillip Blackwell  07/07/2018, 9:41 PM   Pager [phone] If no answer or after 5 PM call [phone]

## 2018-07-07 NOTE — H&P (View-Only) (Signed)
Referring Provider:  Dr. Wardell Heath Primary Care Physician:  System, Pcp Not In Primary Gastroenterologist:  [facility]  Reason for Consultation:  Rectal bleeding   HPI: Phillip Blackwell is a 57 y.o. male admitted through the emergency room this evening, in transfer from [facility] in [place], [place], because of accelerated rectal bleeding.  The patient completed external beam radiation therapy for prostate cancer in [place], [place] in August 2018.  He did fine for the first 6 months or so, but beginning in February of this year, he began to see periodic blood with his bowel movements, initially just streaking of blood but over time, increasing amounts of blood.  Both the frequency and the amount of blood have progressively increased, especially over the past several weeks.  He is now passing some dark red clots, and is sometimes passing blood even in the absence of a bowel movement.  He estimates he is passing blood about 6 or 8 times a day.  Along with this, he has had symptoms of rectal pain, and a sensation of pressure, and irregularity of bowel habit with alternating constipation and diarrhea, sometimes requiring a laxative.  6 days ago, he had sigmoidoscopy performed at the [facility] in [place], [place], which showed an anterior mass-effect in the distal rectum on digital exam, and a deeply ulcerated area with prominent margins.  This was not biopsied for fear of causing complications.  The exam was otherwise normal up to 30 cm.  I have examined the endoscopic photographs which the patient had at the bedside, and this looks like a very necrotic ulceration, corresponding in location to what would be expected for radiation proctitis.  The patient had a CT scan at [facility], which showed a "slightly prominent" prostate gland, no pelvic lymphadenopathy or bladder issues.  Hemoglobin was 10.9.   Past Medical History:  Diagnosis Date  . Anxiety   .  Depression   . GERD (gastroesophageal reflux disease)   . Hypertension   . Rectal bleeding 07/07/2018  . Sleep apnea   . Stroke [facility])    2014    Past Surgical History:  Procedure Laterality Date  . HERNIA REPAIR      Prior to Admission medications   Medication Sig Start Date End Date Taking? Authorizing Provider  amLODipine (NORVASC) 10 MG tablet Take 10 mg by mouth daily.    [provider]  aspirin 81 MG chewable tablet Chew 81 mg by mouth daily.    [provider]  atorvastatin (LIPITOR) 40 MG tablet Take 40 mg by mouth daily.    [provider]  Cholecalciferol (VITAMIN D3) 2000 units TABS Take 1 tablet by mouth daily.    [provider]  clonazePAM (KLONOPIN) 1 MG tablet Take 1 mg by mouth every 8 (eight) hours as needed for anxiety.    [provider]  DULoxetine (CYMBALTA) 60 MG capsule Take 60 mg by mouth 2 (two) times daily.    [provider]  lisinopril (PRINIVIL,ZESTRIL) 40 MG tablet Take 40 mg by mouth daily.    [provider]  lurasidone (LATUDA) 40 MG TABS tablet Take 40 mg by mouth daily with breakfast.    [provider]  metoprolol (LOPRESSOR) 50 MG tablet Take 50 mg by mouth 2 (two) times daily.    [provider]  Multiple Vitamins-Minerals (MULTIVITAMIN WITH MINERALS) tablet Take 1 tablet by mouth daily.    [provider]  omeprazole (PRILOSEC) 20 MG  capsule Take 20 mg by mouth daily.    [provider]  prazosin (MINIPRESS) 1 MG capsule Take 3 mg by mouth at bedtime.    [provider]  sildenafil (VIAGRA) 100 MG tablet Take 100 mg by mouth daily as needed for erectile dysfunction.    [provider]    Current Facility-Administered Medications  Medication Dose Route Frequency Provider Last Rate Last Dose  . acetaminophen (TYLENOL) tablet 650 mg  650 mg Oral Q6H PRN Caren Griffins, MD      . ciprofloxacin (CIPRO) IVPB 400 mg  400 mg  Intravenous Q12H Caren Griffins, MD 200 mL/hr at 07/07/18 1823 400 mg at 07/07/18 1823  . feeding supplement (ENSURE ENLIVE) (ENSURE ENLIVE) liquid 237 mL  237 mL Oral BID BM Caren Griffins, MD   237 mL at 07/07/18 1742  . metroNIDAZOLE (FLAGYL) IVPB 500 mg  500 mg Intravenous Q8H Caren Griffins, MD 100 mL/hr at 07/07/18 2037 500 mg at 07/07/18 2037  . oxyCODONE (Oxy IR/ROXICODONE) immediate release tablet 5 mg  5 mg Oral Q4H PRN Caren Griffins, MD   5 mg at 07/07/18 1815    Allergies as of 07/07/2018  . (No Known Allergies)    History reviewed. No pertinent family history.  Social History   Socioeconomic History  . Marital status: Married    Spouse name: Not on file  . Number of children: Not on file  . Years of education: Not on file  . Highest education level: Not on file  Occupational History  . Not on file  Social Needs  . Financial resource strain: Not on file  . Food insecurity:    Worry: Not on file    Inability: Not on file  . Transportation needs:    Medical: Not on file    Non-medical: Not on file  Tobacco Use  . Smoking status: Never Smoker  . Smokeless tobacco: Never Used  Substance and Sexual Activity  . Alcohol use: No  . Drug use: No  . Sexual activity: Yes  Lifestyle  . Physical activity:    Days per week: Not on file    Minutes per session: Not on file  . Stress: Not on file  Relationships  . Social connections:    Talks on phone: Not on file    Gets together: Not on file    Attends religious service: Not on file    Active member of club or organization: Not on file    Attends meetings of clubs or organizations: Not on file    Relationship status: Not on file  . Intimate partner violence:    Fear of current or ex partner: Not on file    Emotionally abused: Not on file    Physically abused: Not on file    Forced sexual activity: Not on file  Other Topics Concern  . Not on file  Social History Narrative  . Not on file    Review  of Systems: See history of present illness  Physical Exam: Vital signs in last 24 hours: Temp:  [101.6 F (38.7 C)] 101.6 F (38.7 C) (11/27 1719) Pulse Rate:  [118] 118 (11/27 1719) Resp:  [20] 20 (11/27 1719) BP: (149)/(100) 149/100 (11/27 1719) SpO2:  [97 %] 97 % (11/27 1719) Last BM Date: 07/06/18  This is a very pleasant, well-nourished, healthy-appearing African-American male in absolutely no distress.  Wife and daughter are at the bedside.  He is anicteric and without  significant conjunctival pallor.  Chest clear, heart normal, abdomen slightly adipose but without any mass or tenderness.  No scleral icterus, fully ambulatory, cognitively intact, no evident focal neurologic deficits.  Rectal exam shows no evident perianal disease, normal sphincter tone, and a firm nodular irregular mass-like fullness in the region of the prostate gland.  No stool is present.  There is no fecal impaction.  The examining finger has a scant amount of burgundy serosanguineous fluid on it, but no frank blood.  Intake/Output from previous day: No intake/output data recorded. Intake/Output this shift: No intake/output data recorded.  Lab Results: Recent Labs    07/07/18 1912  WBC 14.1*  HGB 11.0*  HCT 33.7*  PLT 231   BMET Recent Labs    07/07/18 1912  NA 136  K 3.6  CL 109  CO2 20*  GLUCOSE 116*  BUN 5*  CREATININE 1.04  CALCIUM 8.7*   LFT Recent Labs    07/07/18 1912  PROT 6.7  ALBUMIN 2.8*  AST 21  ALT 15  ALKPHOS 89  BILITOT 0.9   PT/INR No results for input(s): LABPROT, INR in the last 72 hours.  Studies/Results: Dg Chest Port 1 View  Result Date: 07/07/2018 CLINICAL DATA:  57 y/o  M; fever. EXAM: PORTABLE CHEST 1 VIEW COMPARISON:  None. FINDINGS: The heart size and mediastinal contours are within normal limits. Both lungs are clear. The visualized skeletal structures are unremarkable. IMPRESSION: No active disease. Electronically Signed   By: Kristine Garbe  M.D.   On: 07/07/2018 19:19    Impression: This is clearly an ulcerated, necrotic lesion accounting for the patient's rectal bleeding.  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  Plan: 1.  I will check an updated PSA level.  If it is sky high, that might support the idea of this being a necrotic extension of prostate cancer. 2.  I would recommend general surgical consultation for definitive management of this problem 3.  Diagnostically, I do not feel that there is any likely benefit to repeating the patient's sigmoidoscopic exam, since he had a recent sigmoidoscopy and he has the report in hand, with clear description and excellent endoscopic photographs. 4.  Therapeutically, I do not think that any endoscopic intervention would be of likely benefit, given the very necrotic nature of the patient's lesion.  I suppose if he had profuse emergent bleeding, we could use hemo-spray to temporarily stop the bleeding. 5.  I will sign off, because I do not think there is any further role for [place] with this patient, but feel free to call us back if further input from [place] would be helpful.   LOS: 1 day   Youlanda Mighty Kaedyn Belardo  07/07/2018, 9:41 PM   Pager [phone] If no answer or after 5 PM call [phone]

## 2018-07-08 DIAGNOSIS — Z79899 Other long term (current) drug therapy: Secondary | ICD-10-CM | POA: Diagnosis not present

## 2018-07-08 DIAGNOSIS — E785 Hyperlipidemia, unspecified: Secondary | ICD-10-CM | POA: Diagnosis present

## 2018-07-08 DIAGNOSIS — K921 Melena: Secondary | ICD-10-CM | POA: Diagnosis present

## 2018-07-08 DIAGNOSIS — K922 Gastrointestinal hemorrhage, unspecified: Secondary | ICD-10-CM | POA: Diagnosis not present

## 2018-07-08 DIAGNOSIS — K626 Ulcer of anus and rectum: Secondary | ICD-10-CM | POA: Diagnosis present

## 2018-07-08 DIAGNOSIS — R066 Hiccough: Secondary | ICD-10-CM | POA: Diagnosis present

## 2018-07-08 DIAGNOSIS — F419 Anxiety disorder, unspecified: Secondary | ICD-10-CM | POA: Diagnosis present

## 2018-07-08 DIAGNOSIS — K625 Hemorrhage of anus and rectum: Secondary | ICD-10-CM | POA: Diagnosis present

## 2018-07-08 DIAGNOSIS — G4730 Sleep apnea, unspecified: Secondary | ICD-10-CM | POA: Diagnosis present

## 2018-07-08 DIAGNOSIS — Z8546 Personal history of malignant neoplasm of prostate: Secondary | ICD-10-CM | POA: Diagnosis not present

## 2018-07-08 DIAGNOSIS — K219 Gastro-esophageal reflux disease without esophagitis: Secondary | ICD-10-CM | POA: Diagnosis present

## 2018-07-08 DIAGNOSIS — D500 Iron deficiency anemia secondary to blood loss (chronic): Secondary | ICD-10-CM | POA: Diagnosis present

## 2018-07-08 DIAGNOSIS — Z7982 Long term (current) use of aspirin: Secondary | ICD-10-CM | POA: Diagnosis not present

## 2018-07-08 DIAGNOSIS — F329 Major depressive disorder, single episode, unspecified: Secondary | ICD-10-CM | POA: Diagnosis present

## 2018-07-08 DIAGNOSIS — E876 Hypokalemia: Secondary | ICD-10-CM | POA: Diagnosis present

## 2018-07-08 DIAGNOSIS — Z923 Personal history of irradiation: Secondary | ICD-10-CM | POA: Diagnosis not present

## 2018-07-08 DIAGNOSIS — K627 Radiation proctitis: Secondary | ICD-10-CM | POA: Diagnosis present

## 2018-07-08 DIAGNOSIS — I10 Essential (primary) hypertension: Secondary | ICD-10-CM | POA: Diagnosis present

## 2018-07-08 DIAGNOSIS — Z8673 Personal history of transient ischemic attack (TIA), and cerebral infarction without residual deficits: Secondary | ICD-10-CM | POA: Diagnosis not present

## 2018-07-08 DIAGNOSIS — C61 Malignant neoplasm of prostate: Secondary | ICD-10-CM | POA: Diagnosis not present

## 2018-07-08 DIAGNOSIS — R000 Tachycardia, unspecified: Secondary | ICD-10-CM | POA: Diagnosis present

## 2018-07-08 DIAGNOSIS — R509 Fever, unspecified: Secondary | ICD-10-CM | POA: Diagnosis present

## 2018-07-08 LAB — COMPREHENSIVE METABOLIC PANEL
ALK PHOS: 76 U/L (ref 38–126)
ALT: 14 U/L (ref 0–44)
ANION GAP: 6 (ref 5–15)
AST: 20 U/L (ref 15–41)
Albumin: 2.6 g/dL — ABNORMAL LOW (ref 3.5–5.0)
BILIRUBIN TOTAL: 1 mg/dL (ref 0.3–1.2)
BUN: 7 mg/dL (ref 6–20)
CALCIUM: 8.6 mg/dL — AB (ref 8.9–10.3)
CO2: 25 mmol/L (ref 22–32)
Chloride: 107 mmol/L (ref 98–111)
Creatinine, Ser: 1.32 mg/dL — ABNORMAL HIGH (ref 0.61–1.24)
GFR calc non Af Amer: 59 mL/min — ABNORMAL LOW (ref >60)
Glucose, Bld: 109 mg/dL — ABNORMAL HIGH (ref 70–99)
Potassium: 4 mmol/L (ref 3.5–5.1)
Sodium: 138 mmol/L (ref 135–145)
TOTAL PROTEIN: 6.3 g/dL — AB (ref 6.5–8.1)

## 2018-07-08 LAB — URINALYSIS, ROUTINE W REFLEX MICROSCOPIC
BILIRUBIN URINE: NEGATIVE
Bacteria, UA: NONE SEEN
Glucose, UA: NEGATIVE mg/dL
Ketones, ur: NEGATIVE mg/dL
Nitrite: NEGATIVE
PH: 6 (ref 5.0–8.0)
Protein, ur: NEGATIVE mg/dL
SPECIFIC GRAVITY, URINE: 1.004 — AB (ref 1.005–1.030)

## 2018-07-08 LAB — CBC
HCT: 31.7 % — ABNORMAL LOW (ref 39.0–52.0)
Hemoglobin: 9.9 g/dL — ABNORMAL LOW (ref 13.0–17.0)
MCH: 34.6 pg — AB (ref 26.0–34.0)
MCHC: 31.2 g/dL (ref 30.0–36.0)
MCV: 110.8 fL — ABNORMAL HIGH (ref 80.0–100.0)
NRBC: 0 % (ref 0.0–0.2)
PLATELETS: 220 10*3/uL (ref 150–400)
RBC: 2.86 MIL/uL — ABNORMAL LOW (ref 4.22–5.81)
RDW: 11.9 % (ref 11.5–15.5)
WBC: 13 10*3/uL — ABNORMAL HIGH (ref 4.0–10.5)

## 2018-07-08 LAB — TYPE AND SCREEN
ABO/RH(D): O POS
Antibody Screen: NEGATIVE

## 2018-07-08 LAB — PROTIME-INR
INR: 1.14
Prothrombin Time: 14.5 seconds (ref 11.4–15.2)

## 2018-07-08 LAB — PSA: Prostatic Specific Antigen: 2.63 ng/mL (ref 0.00–4.00)

## 2018-07-08 LAB — ABO/RH: ABO/RH(D): O POS

## 2018-07-08 MED ORDER — CLONAZEPAM 1 MG PO TABS
1.0000 mg | ORAL_TABLET | Freq: Every day | ORAL | Status: DC
  Administered 2018-07-08 – 2018-07-10 (×3): 1 mg via ORAL
  Filled 2018-07-08 (×2): qty 2
  Filled 2018-07-08: qty 1

## 2018-07-08 MED ORDER — TAMSULOSIN HCL 0.4 MG PO CAPS
0.4000 mg | ORAL_CAPSULE | Freq: Every day | ORAL | Status: DC
  Administered 2018-07-08 – 2018-07-10 (×3): 0.4 mg via ORAL
  Filled 2018-07-08 (×2): qty 1

## 2018-07-08 MED ORDER — SODIUM CHLORIDE 0.9 % IV SOLN
25.0000 mg | Freq: Four times a day (QID) | INTRAVENOUS | Status: DC | PRN
  Administered 2018-07-08 – 2018-07-10 (×2): 25 mg via INTRAVENOUS
  Filled 2018-07-08 (×3): qty 1

## 2018-07-08 MED ORDER — LACTATED RINGERS IV SOLN
INTRAVENOUS | Status: DC
  Administered 2018-07-08 – 2018-07-09 (×3): via INTRAVENOUS

## 2018-07-08 MED ORDER — ATORVASTATIN CALCIUM 40 MG PO TABS
40.0000 mg | ORAL_TABLET | Freq: Every day | ORAL | Status: DC
  Administered 2018-07-08 – 2018-07-11 (×3): 40 mg via ORAL
  Filled 2018-07-08 (×3): qty 1

## 2018-07-08 MED ORDER — LURASIDONE HCL 40 MG PO TABS: 40.0000 mg | ORAL_TABLET | Freq: Every day | ORAL | Status: DC

## 2018-07-08 MED ORDER — PRAZOSIN HCL 2 MG PO CAPS
2.0000 mg | ORAL_CAPSULE | Freq: Every day | ORAL | Status: DC
  Administered 2018-07-08 – 2018-07-10 (×3): 2 mg via ORAL
  Filled 2018-07-08 (×4): qty 1

## 2018-07-08 MED ORDER — DULOXETINE HCL 60 MG PO CPEP
60.0000 mg | ORAL_CAPSULE | Freq: Every evening | ORAL | Status: DC
  Administered 2018-07-08 – 2018-07-10 (×3): 60 mg via ORAL
  Filled 2018-07-08 (×3): qty 1

## 2018-07-08 NOTE — Progress Notes (Signed)
PROGRESS NOTE                                                                                                                                                                                                             Patient Demographics:    Phillip Blackwell, is a 57 y.o. male, DOB - Apr 21, 1961, XNT:[id number]  Admit date - 07/07/2018   Admitting Physician Ivor Costa, MD  Outpatient Primary MD for the patient is System, Pcp Not In  LOS - 1  CC - BPR     Brief Narrative  Phillip Blackwell is a 57 y.o. male with medical history significant of prostate cancer status post radiation therapy completed in August 2018, hypertension, hyperlipidemia, who presents to the hospital with chief complaint of bright red blood per rectum since Feb 2019. He had a Flexible sigmoidoscopy performed last week at a [facility], findings: "Rectal exam revealed for masslike tissue on palpation.  Upon entry, the into the rectum was found to be necrotic, deep and ulcerated extending from the dentate line to 7 cm from the insertion point.  Query radiation changes versus recurrence of prostate cancer versus rectal primary.   Subjective:    Phillip Blackwell today has, No headache, No chest pain, No abdominal pain - No Nausea, No new weakness tingling or numbness, No Cough - SOB.     Assessment  & Plan :     1.  Red blood punctum ongoing for several months causing subacute lower GI bleeding related anemia.  This most likely is due to rectal erosion either by prostate cancer or by radiation proctitis.  Case discussed with GI physician Dr. Cristina Gong who saw the patient on 07/07/2018, for now conservative management with clear liquid diet, monitoring H&H, type and screen.  He recommends that we involve general surgery as patient eventually might require surgical intervention.  Surgery consulted.  We will continue to monitor.  2.  Prostate cancer.  Status post radiation treatments, PSA stable at 2.6.  Outpatient follow-up  with urology if needed.  3.  Dyslipidemia.  On statin.  4.  Intermittent history of hiccups.  PRN Thorazine.   We will get baseline EKG.    Family Communication  :  Daughter sleeping bedside  Code Status :  Full  Disposition Plan  :  TBD  Consults  :  CCS and GI  Procedures  :  None  DVT Prophylaxis  :  SCDs    Lab Results  Component Value Date  PLT 220 07/08/2018    Diet :  Diet Order            Diet full liquid Room service appropriate? Yes; Fluid consistency: Thin  Diet effective now               Inpatient Medications Scheduled Meds: . atorvastatin  40 mg Oral Daily  . DULoxetine  60 mg Oral BID  . feeding supplement (ENSURE ENLIVE)  237 mL Oral BID BM  . lurasidone  40 mg Oral Q breakfast  . prazosin  3 mg Oral QHS   Continuous Infusions: . chlorproMAZINE (THORAZINE) IV 25 mg (07/08/18 0952)  . ciprofloxacin 400 mg (07/08/18 0539)  . lactated ringers    . metronidazole 500 mg (07/08/18 0234)   PRN Meds:.acetaminophen, chlorproMAZINE (THORAZINE) IV, clonazePAM, oxyCODONE  Antibiotics  :   Anti-infectives (From admission, onward)   Start     Dose/Rate Route Frequency Ordered Stop   07/07/18 1900  metroNIDAZOLE (FLAGYL) IVPB 500 mg     500 mg 100 mL/hr over 60 Minutes Intravenous Every 8 hours 07/07/18 1739     07/07/18 1830  ciprofloxacin (CIPRO) IVPB 400 mg     400 mg 200 mL/hr over 60 Minutes Intravenous Every 12 hours 07/07/18 1739         Objective:   Vitals:   07/07/18 1719 07/07/18 2141 07/08/18 0600  BP: (!) 149/100 (!) 160/79 127/89  Pulse: (!) 118 (!) 120 (!) 104  Resp: 20 20 16   Temp: (!) 101.6 F (38.7 C) 99.6 F (37.6 C) 98.3 F (36.8 C)  TempSrc: Oral Oral Oral  SpO2: 97% 99% 97%    Wt Readings from Last 3 Encounters:  08/22/16 97.1 kg     Intake/Output Summary (Last 24 hours) at 07/08/2018 0955 Last data filed at 07/08/2018 0542 Gross per 24 hour  Intake 396.16 ml  Output 550 ml  Net -153.84 ml      Physical Exam  Awake Alert, Oriented X 3, No new F.N deficits, Normal affect [place].AT,PERRAL Supple Neck,No JVD, No cervical lymphadenopathy appriciated.  Symmetrical Chest wall movement, Good air movement bilaterally, CTAB RRR,No Gallops,Rubs or new Murmurs, No Parasternal Heave +ve B.Sounds, Abd Soft, No tenderness, No organomegaly appriciated, No rebound - guarding or rigidity. No Cyanosis, Clubbing or edema, No new Rash or bruise       Data Review:    CBC Recent Labs  Lab 07/07/18 1912 07/08/18 0528  WBC 14.1* 13.0*  HGB 11.0* 9.9*  HCT 33.7* 31.7*  PLT 231 220  MCV 109.1* 110.8*  MCH 35.6* 34.6*  MCHC 32.6 31.2  RDW 12.1 11.9    Chemistries  Recent Labs  Lab 07/07/18 1912 07/08/18 0528  NA 136 138  K 3.6 4.0  CL 109 107  CO2 20* 25  GLUCOSE 116* 109*  BUN 5* 7  CREATININE 1.04 1.32*  CALCIUM 8.7* 8.6*  AST 21 20  ALT 15 14  ALKPHOS 89 76  BILITOT 0.9 1.0   ------------------------------------------------------------------------------------------------------------------ No results for input(s): CHOL, HDL, LDLCALC, TRIG, CHOLHDL, LDLDIRECT in the last 72 hours.  No results found for: HGBA1C ------------------------------------------------------------------------------------------------------------------ No results for input(s): TSH, T4TOTAL, T3FREE, THYROIDAB in the last 72 hours.  Invalid input(s): FREET3 ------------------------------------------------------------------------------------------------------------------ No results for input(s): VITAMINB12, FOLATE, FERRITIN, TIBC, IRON, RETICCTPCT in the last 72 hours.  Coagulation profile Recent Labs  Lab 07/08/18 0528  INR 1.14    No results for input(s): DDIMER in the last 72 hours.  Cardiac Enzymes No results  for input(s): CKMB, TROPONINI, MYOGLOBIN in the last 168 hours.  Invalid input(s):  CK ------------------------------------------------------------------------------------------------------------------ No results found for: BNP  Micro Results No results found for this or any previous visit (from the past 240 hour(s)).  Radiology Reports Dg Chest Port 1 View  Result Date: 07/07/2018 CLINICAL DATA:  57 y/o  M; fever. EXAM: PORTABLE CHEST 1 VIEW COMPARISON:  None. FINDINGS: The heart size and mediastinal contours are within normal limits. Both lungs are clear. The visualized skeletal structures are unremarkable. IMPRESSION: No active disease. Electronically Signed   By: Kristine Garbe M.D.   On: 07/07/2018 19:19    Time Spent in minutes  30   Lala Lund M.D on 07/08/2018 at 9:55 AM  To page go to [email or web address] - password [email or web address]

## 2018-07-08 NOTE — Consult Note (Signed)
[facility] Surgery Consult/Admission Note  Phillip Blackwell 11/26/1960  [id number].    Requesting MD: Dr. Candiss Norse Chief Complaint/Reason for Consult: rectal bleeding  HPI:  Pt is a 57 y.o.malewith a hx of ofprostate cancer S/P radiation therapy completed in August 2018, HTN, HLD, who presented to the hospital with chief complaint of bright red blood per rectum since Feb 2019. Blood per rectum has progressively worsened since February. Pt has constant mild, achy, non radiating rectal pain that is worse with bowel movements. Pt tries to prevent constipation because this makes the pain and bleeding worse. At times the pt feels the need to have a BM and he has flatus and passes blood clots but no stool. Pt denies abdominal pain, nausea, vomiting, blood in urine. Pt was febrile overnight. Pt has a hx of R inguinal hernia repair. He is not on anticoagulation.  Flexible sigmoidoscopy performed last week at a [place] hospital,findings:"Rectal exam revealed for masslike tissue on palpation. Upon entry, the into the rectum was found to be necrotic, deep and ulcerated extending from the dentate line to 7 cm from the insertion point. Query radiation changes versus recurrence of prostate cancer versus rectal primary  Labs: Hgb 9.9, WBC 13, Creatinine 1.31  ROS:  Review of Systems  Constitutional: Positive for fever. Negative for chills and diaphoresis.  HENT: Negative for sore throat.   Respiratory: Negative for cough and shortness of breath.   Cardiovascular: Negative for chest pain.  Gastrointestinal: Positive for blood in stool. Negative for abdominal pain, constipation, diarrhea, nausea and vomiting.  Genitourinary: Negative for dysuria and hematuria.  Skin: Negative for rash.  Neurological: Negative for dizziness and loss of consciousness.  All other systems reviewed and are negative.    History reviewed. No pertinent family history.  Past Medical History:  Diagnosis Date  . Anxiety    . Depression   . GERD (gastroesophageal reflux disease)   . Hypertension   . Rectal bleeding 07/07/2018  . Sleep apnea   . Stroke [facility])    2014    Past Surgical History:  Procedure Laterality Date  . HERNIA REPAIR      Social History:  reports that he has never smoked. He has never used smokeless tobacco. He reports that he does not drink alcohol or use drugs.  Allergies: No Known Allergies  Medications Prior to Admission  Medication Sig Dispense Refill  . amLODipine (NORVASC) 10 MG tablet Take 10 mg by mouth daily.    Marland Kitchen aspirin 81 MG chewable tablet Chew 81 mg by mouth daily.    Marland Kitchen atorvastatin (LIPITOR) 40 MG tablet Take 40 mg by mouth daily.    . Cholecalciferol (VITAMIN D3) 2000 units TABS Take 1 tablet by mouth daily.    . clonazePAM (KLONOPIN) 1 MG tablet Take 1 mg by mouth every 8 (eight) hours as needed for anxiety.    . DULoxetine (CYMBALTA) 60 MG capsule Take 60 mg by mouth 2 (two) times daily.    Marland Kitchen lisinopril (PRINIVIL,ZESTRIL) 40 MG tablet Take 40 mg by mouth daily.    Marland Kitchen lurasidone (LATUDA) 40 MG TABS tablet Take 40 mg by mouth daily with breakfast.    . metoprolol (LOPRESSOR) 50 MG tablet Take 50 mg by mouth 2 (two) times daily.    . Multiple Vitamins-Minerals (MULTIVITAMIN WITH MINERALS) tablet Take 1 tablet by mouth daily.    Marland Kitchen omeprazole (PRILOSEC) 20 MG capsule Take 20 mg by mouth daily.    . prazosin (MINIPRESS) 1 MG capsule Take  3 mg by mouth at bedtime.      Blood pressure 127/89, pulse (!) 104, temperature 98.3 F (36.8 C), temperature source Oral, resp. rate 16, SpO2 97 %.  Physical Exam  Constitutional: He is oriented to person, place, and time. He appears well-developed and well-nourished. No distress.  HENT:  Head: Normocephalic and atraumatic.  Nose: Nose normal.  Mouth/Throat: Oropharynx is clear and moist and mucous membranes are normal. No oropharyngeal exudate.  Eyes: Pupils are equal, round, and reactive to light. Conjunctivae are normal.  Right eye exhibits no discharge. Left eye exhibits no discharge. No scleral icterus.  Neck: Normal range of motion. Neck supple. No thyromegaly present.  Cardiovascular: Normal rate, regular rhythm, normal heart sounds and intact distal pulses.  No murmur heard. Pulses:      Radial pulses are 2+ on the right side, and 2+ on the left side.       Dorsalis pedis pulses are 2+ on the right side, and 2+ on the left side.  Pulmonary/Chest: Effort normal and breath sounds normal. No respiratory distress. He has no wheezes. He has no rhonchi. He has no rales.  Abdominal: Soft. Normal appearance and bowel sounds are normal. He exhibits no distension. There is no hepatosplenomegaly. There is no tenderness. There is no rigidity and no guarding.  Musculoskeletal: Normal range of motion. He exhibits no edema, tenderness or deformity.  Lymphadenopathy:    He has no cervical adenopathy.  Neurological: He is alert and oriented to person, place, and time.  Skin: Skin is warm and dry. No rash noted. He is not diaphoretic.  Psychiatric: He has a normal mood and affect.  Nursing note and vitals reviewed.   Results for orders placed or performed during the hospital encounter of 07/07/18 (from the past 48 hour(s))  CBC     Status: Abnormal   Collection Time: 07/07/18  7:12 PM  Result Value Ref Range   WBC 14.1 (H) 4.0 - 10.5 K/uL   RBC 3.09 (L) 4.22 - 5.81 MIL/uL   Hemoglobin 11.0 (L) 13.0 - 17.0 g/dL   HCT 33.7 (L) 39.0 - 52.0 %   MCV 109.1 (H) 80.0 - 100.0 fL   MCH 35.6 (H) 26.0 - 34.0 pg   MCHC 32.6 30.0 - 36.0 g/dL   RDW 12.1 11.5 - 15.5 %   Platelets 231 150 - 400 K/uL   nRBC 0.0 0.0 - 0.2 %    Comment: Performed at [facility], [place] [address] [place], [place] [address]  Comprehensive metabolic panel     Status: Abnormal   Collection Time: 07/07/18  7:12 PM  Result Value Ref Range   Sodium 136 135 - 145 mmol/L   Potassium 3.6 3.5 - 5.1 mmol/L   Chloride 109 98 - 111 mmol/L   CO2 20 (L)  22 - 32 mmol/L   Glucose, Bld 116 (H) 70 - 99 mg/dL   BUN 5 (L) 6 - 20 mg/dL   Creatinine, Ser 1.04 0.61 - 1.24 mg/dL   Calcium 8.7 (L) 8.9 - 10.3 mg/dL   Total Protein 6.7 6.5 - 8.1 g/dL   Albumin 2.8 (L) 3.5 - 5.0 g/dL   AST 21 15 - 41 U/L   ALT 15 0 - 44 U/L   Alkaline Phosphatase 89 38 - 126 U/L   Total Bilirubin 0.9 0.3 - 1.2 mg/dL   GFR calc non Af Amer >60 >60 mL/min   GFR calc Af Amer >60 >60 mL/min   Anion  gap 7 5 - 15    Comment: Performed at [facility], [place] [address] [place], [place] [address]  Culture, blood (Routine X 2) w Reflex to ID Panel     Status: None (Preliminary result)   Collection Time: 07/07/18  7:12 PM  Result Value Ref Range   Specimen Description BLOOD LEFT ANTECUBITAL    Special Requests      BOTTLES DRAWN AEROBIC AND ANAEROBIC Blood Culture adequate volume   Culture      NO GROWTH < 24 HOURS Performed at [facility], [place] [address] [place], [place] [address]    Report Status PENDING   Culture, blood (Routine X 2) w Reflex to ID Panel     Status: None (Preliminary result)   Collection Time: 07/07/18  7:12 PM  Result Value Ref Range   Specimen Description BLOOD RIGHT ANTECUBITAL    Special Requests      BOTTLES DRAWN AEROBIC AND ANAEROBIC Blood Culture adequate volume   Culture      NO GROWTH < 24 HOURS Performed at [facility], [place] [address], [place], [place] [address]    Report Status PENDING   Protime-INR     Status: None   Collection Time: 07/08/18  5:28 AM  Result Value Ref Range   Prothrombin Time 14.5 11.4 - 15.2 seconds   INR 1.14     Comment: Performed at [place] [address] [place], [place] [address]  Comprehensive metabolic panel     Status: Abnormal   Collection Time: 07/08/18  5:28 AM  Result Value Ref Range   Sodium 138 135 - 145 mmol/L   Potassium 4.0 3.5 - 5.1 mmol/L   Chloride 107 98 - 111 mmol/L   CO2 25 22 - 32 mmol/L   Glucose, Bld 109 (H) 70 - 99 mg/dL   BUN 7 6 - 20 mg/dL    Creatinine, Ser 1.32 (H) 0.61 - 1.24 mg/dL   Calcium 8.6 (L) 8.9 - 10.3 mg/dL   Total Protein 6.3 (L) 6.5 - 8.1 g/dL   Albumin 2.6 (L) 3.5 - 5.0 g/dL   AST 20 15 - 41 U/L   ALT 14 0 - 44 U/L   Alkaline Phosphatase 76 38 - 126 U/L   Total Bilirubin 1.0 0.3 - 1.2 mg/dL   GFR calc non Af Amer 59 (L) >60 mL/min   GFR calc Af Amer >60 >60 mL/min   Anion gap 6 5 - 15    Comment: Performed at [facility], [place] [address], [place],  [address]  CBC     Status: Abnormal   Collection Time: 07/08/18  5:28 AM  Result Value Ref Range   WBC 13.0 (H) 4.0 - 10.5 K/uL   RBC 2.86 (L) 4.22 - 5.81 MIL/uL   Hemoglobin 9.9 (L) 13.0 - 17.0 g/dL   HCT 31.7 (L) 39.0 - 52.0 %   MCV 110.8 (H) 80.0 - 100.0 fL   MCH 34.6 (H) 26.0 - 34.0 pg   MCHC 31.2 30.0 - 36.0 g/dL   RDW 11.9 11.5 - 15.5 %   Platelets 220 150 - 400 K/uL   nRBC 0.0 0.0 - 0.2 %    Comment: Performed at [facility], [place] [address] [place],  [address]  PSA     Status: None   Collection Time: 07/08/18  5:28 AM  Result Value Ref Range   Prostatic Specific Antigen 2.63 0.00 -  4.00 ng/mL    Comment: (NOTE) While PSA levels of <=4.0 ng/ml are reported as reference range, some men with levels below 4.0 ng/ml can have prostate cancer and many men with PSA above 4.0 ng/ml do not have prostate cancer.  Other tests such as free PSA, age specific reference ranges, PSA velocity and PSA doubling time may be helpful especially in men less than 80 years old. Performed at [facility], [place] [address] [place], [place] [address]    Dg Chest Port 1 View  Result Date: 07/07/2018 CLINICAL DATA:  57 y/o  M; fever. EXAM: PORTABLE CHEST 1 VIEW COMPARISON:  None. FINDINGS: The heart size and mediastinal contours are within normal limits. Both lungs are clear. The visualized skeletal structures are unremarkable. IMPRESSION: No active disease. Electronically Signed   By: Kristine Garbe M.D.   On: 07/07/2018 19:19       Assessment/Plan Active Problems:   Lower GI bleed   HTN (hypertension)   HLD (hyperlipidemia)   Prostate cancer (HCC)  Rectal bleeding - large ulcerated, bleeding lesion seen on sigmoidoscopic exam at outside hospital, no biopsy taken - GI recommends checking PSA and has signed off - Hgb is stable  FEN: FLD VTE: SCD's ID: Flagyl 11/27>> Foley: none Follow up: TBD  Plan: recommend bowel regiment to keep bowels soft. Regular diet.  No emergent surgical indication at this time if H&H remain stable. Etiology could be from radiation vs malignancy. Recommend pt follow up with one of our colorectal specialist to discuss outpt management.  Thank you for the consult.    Kalman Drape, [facility] Surgery 07/08/2018, 10:48 AM Pager: [phone] Consults: [phone] Mon-Fri 7:00 am-4:30 pm Sat-Sun 7:00 am-11:30 am

## 2018-07-09 LAB — CBC
HCT: 30 % — ABNORMAL LOW (ref 39.0–52.0)
Hemoglobin: 9.6 g/dL — ABNORMAL LOW (ref 13.0–17.0)
MCH: 34.7 pg — AB (ref 26.0–34.0)
MCHC: 32 g/dL (ref 30.0–36.0)
MCV: 108.3 fL — ABNORMAL HIGH (ref 80.0–100.0)
Platelets: 203 10*3/uL (ref 150–400)
RBC: 2.77 MIL/uL — ABNORMAL LOW (ref 4.22–5.81)
RDW: 11.7 % (ref 11.5–15.5)
WBC: 11.8 10*3/uL — AB (ref 4.0–10.5)
nRBC: 0 % (ref 0.0–0.2)

## 2018-07-09 LAB — BASIC METABOLIC PANEL
Anion gap: 6 (ref 5–15)
BUN: 5 mg/dL — AB (ref 6–20)
CO2: 22 mmol/L (ref 22–32)
CREATININE: 1.18 mg/dL (ref 0.61–1.24)
Calcium: 8.4 mg/dL — ABNORMAL LOW (ref 8.9–10.3)
Chloride: 107 mmol/L (ref 98–111)
Glucose, Bld: 120 mg/dL — ABNORMAL HIGH (ref 70–99)
Potassium: 3.3 mmol/L — ABNORMAL LOW (ref 3.5–5.1)
SODIUM: 135 mmol/L (ref 135–145)

## 2018-07-09 LAB — IRON AND TIBC
Iron: 19 ug/dL — ABNORMAL LOW (ref 45–182)
SATURATION RATIOS: 6 % — AB (ref 17.9–39.5)
TIBC: 340 ug/dL (ref 250–450)
UIBC: 321 ug/dL

## 2018-07-09 LAB — RETICULOCYTES
Immature Retic Fract: 23.7 % — ABNORMAL HIGH (ref 2.3–15.9)
RBC.: 3.05 MIL/uL — AB (ref 4.22–5.81)
Retic Count, Absolute: 120.8 10*3/uL (ref 19.0–186.0)
Retic Ct Pct: 4 % — ABNORMAL HIGH (ref 0.4–3.1)

## 2018-07-09 LAB — MAGNESIUM: MAGNESIUM: 1.9 mg/dL (ref 1.7–2.4)

## 2018-07-09 LAB — FERRITIN: Ferritin: 155 ng/mL (ref 24–336)

## 2018-07-09 LAB — HIV ANTIBODY (ROUTINE TESTING W REFLEX): HIV Screen 4th Generation wRfx: NONREACTIVE

## 2018-07-09 LAB — FOLATE: Folate: 16 ng/mL (ref >5.9)

## 2018-07-09 LAB — VITAMIN B12: Vitamin B-12: 752 pg/mL (ref 180–914)

## 2018-07-09 MED ORDER — ADULT MULTIVITAMIN W/MINERALS CH
1.0000 | ORAL_TABLET | Freq: Every day | ORAL | Status: DC
  Administered 2018-07-09 – 2018-07-11 (×2): 1 via ORAL
  Filled 2018-07-09 (×2): qty 1

## 2018-07-09 MED ORDER — BOOST / RESOURCE BREEZE PO LIQD CUSTOM
1.0000 | ORAL | Status: DC
  Administered 2018-07-09 – 2018-07-10 (×2): 1 via ORAL

## 2018-07-09 MED ORDER — POLYETHYLENE GLYCOL 3350 17 G PO PACK
17.0000 g | PACK | Freq: Every day | ORAL | Status: DC
  Administered 2018-07-09: 17 g via ORAL
  Filled 2018-07-09 (×2): qty 1

## 2018-07-09 MED ORDER — LORAZEPAM 2 MG/ML IJ SOLN: 1.0000 mg | Freq: Once | INTRAMUSCULAR | Status: DC | PRN

## 2018-07-09 MED ORDER — ENSURE ENLIVE PO LIQD
237.0000 mL | Freq: Two times a day (BID) | ORAL | Status: DC
  Administered 2018-07-10: 237 mL via ORAL

## 2018-07-09 MED ORDER — POTASSIUM CHLORIDE CRYS ER 20 MEQ PO TBCR
40.0000 meq | EXTENDED_RELEASE_TABLET | Freq: Once | ORAL | Status: AC
  Administered 2018-07-09: 40 meq via ORAL
  Filled 2018-07-09: qty 2

## 2018-07-09 NOTE — Progress Notes (Signed)
Per d/w Dr. Annye English of surgery, we will do flex sig w/ bx's tomorrow.  Will plan to use small scope, sedation on standby, LIMITED bx's from the healthy-appearing tissue on the margin of the ulcer rather than from the ulcer itself.  Discussed procedure w/ pt and wife.  He understands rationale and slight risk of perf or bleeding.  Note that PSA is nl.  Would also consider pelvic MRI and Urol Consult as suggested by Dr. Dema Severin.  Phillip Blackwell, M.D. Pager [phone] If no answer or after 5 PM call [phone]

## 2018-07-09 NOTE — Consult Note (Signed)
Urology Consult  Consulting MD: Lala Lund, MD  CC: History of prostate cancer with persistent hematochezia  HPI: This is a Phillip Blackwell with a history of adenocarcinoma the prostate.  This was diagnosed by Dr. Alyson Ingles at our [place] office in November, 2017.  At that time, PSA was 5.5.  5/12 cores taken were positive for adenocarcinoma with GS 3+3 pattern.  He was subsequently referred for external beam radiotherapy which was completed in [place], [place].  7920 cGy in 44 fractions from 01/13/17 - 03/17/17.  His last PSA in our office in November, 2018 was 1.1.  He states that his PSA when last checked at the [place] in [place] was approximately 2.  The patient has had intermittent gross blood per rectum since February, 2019.  This has become worse more recently, and he eventually had a flexible sigmoidoscopy at the [place] in [place].  This showed a deeply ulcerated area with raised margins.  No biopsy was performed.  He was admitted here, having been transferred from [place] for further GI evaluation.  He has been seen by Dr. Nadeen Landau from general surgery as well as Dr. Ronald Lobo from gastroenterology.  Flexible sigmoidoscopy and possible biopsy is scheduled for tomorrow.  It was recommended that the patient have an MRI as well.  He has not had significant issues with voiding.  He has not had blood in his urine.  He is on tamsulosin. PMH: Past Medical History:  Diagnosis Date  . Anxiety   . Depression   . GERD (gastroesophageal reflux disease)   . Hypertension   . Rectal bleeding 07/07/2018  . Sleep apnea   . Stroke [facility])    2014    PSH: Past Surgical History:  Procedure Laterality Date  . HERNIA REPAIR      Allergies: No Known Allergies  Medications: Medications Prior to Admission  Medication Sig Dispense Refill Last Dose  . amLODipine (NORVASC) 10 MG tablet Take 10 mg by mouth daily.   07/06/2018 at am  . aspirin 81 MG chewable tablet  Chew 81 mg by mouth daily.   07/06/2018 at am  . atorvastatin (LIPITOR) 40 MG tablet Take 40 mg by mouth daily with supper.    07/05/2018 at pm  . chlorproMAZINE (THORAZINE) 25 MG tablet Take 25 mg by mouth at bedtime.   07/05/2018 at pm  . Cholecalciferol (VITAMIN D3) 2000 units TABS Take 2,000 Units by mouth daily.    07/06/2018 at am  . clonazePAM (KLONOPIN) 1 MG tablet Take 1 mg by mouth at bedtime.    07/05/2018 at pm  . diclofenac (VOLTAREN) 75 MG EC tablet Take 75 mg by mouth daily as needed (back pain).   month ago  . DULoxetine (CYMBALTA) 60 MG capsule Take 60 mg by mouth daily with supper.    07/05/2018 at pm  . LACTOBACILLUS PO Take 1 tablet by mouth daily.   07/06/2018 at am  . lisinopril (PRINIVIL,ZESTRIL) 40 MG tablet Take 40 mg by mouth daily.   07/06/2018 at am  . metoprolol tartrate (LOPRESSOR) 100 MG tablet Take 100 mg by mouth 2 (two) times daily.    07/06/2018 at 800  . Multiple Vitamins-Minerals (MULTIVITAMIN WITH MINERALS) tablet Take 1 tablet by mouth daily.   07/06/2018 at am  . omeprazole (PRILOSEC) 40 MG capsule Take 40 mg by mouth daily.    07/06/2018 at am  . prazosin (MINIPRESS) 1 MG capsule Take 2 mg by mouth at bedtime.    07/05/2018  at pm  . tamsulosin (FLOMAX) 0.4 MG CAPS capsule Take 0.4 mg by mouth daily as needed (frequent urination).   week ago     Social History: Social History   Socioeconomic History  . Marital status: Married    Spouse name: Not on file  . Number of children: Not on file  . Years of education: Not on file  . Highest education level: Not on file  Occupational History  . Not on file  Social Needs  . Financial resource strain: Not on file  . Food insecurity:    Worry: Not on file    Inability: Not on file  . Transportation needs:    Medical: Not on file    Non-medical: Not on file  Tobacco Use  . Smoking status: Never Smoker  . Smokeless tobacco: Never Used  Substance and Sexual Activity  . Alcohol use: No  . Drug use: No   . Sexual activity: Yes  Lifestyle  . Physical activity:    Days per week: Not on file    Minutes per session: Not on file  . Stress: Not on file  Relationships  . Social connections:    Talks on phone: Not on file    Gets together: Not on file    Attends religious service: Not on file    Active member of club or organization: Not on file    Attends meetings of clubs or organizations: Not on file    Relationship status: Not on file  . Intimate partner violence:    Fear of current or ex partner: Not on file    Emotionally abused: Not on file    Physically abused: Not on file    Forced sexual activity: Not on file  Other Topics Concern  . Not on file  Social History Narrative  . Not on file    Family History: History reviewed. No pertinent family history.  Review of Systems: Positive: Rectal bleeding, dizziness, lightheaded feeling. Negative:   A further 10 point review of systems was negative except what is listed in the HPI.  Physical Exam: @VITALS2 @ General: No acute distress.  Awake. Head:  Normocephalic.  Atraumatic. ENT:  EOMI.  Mucous membranes moist Neck:  Supple.  No lymphadenopathy. CV:  Regular rate. Pulmonary: Equal effort bilaterally.   Abdomen: Soft.  Non-tender to palpation. Skin:  Normal turgor.  No visible rash. Extremity: No gross deformity of upper extremities.  No gross deformity of lower extremities. Neurologic: Alert. Appropriate mood.  Rectal:            Normal anal sphincter tone.  Anterior wall of rectum with a raised rim in a circular nature.  No discrete prostate firmness.  Studies:  Recent Labs    07/08/18 0528 07/09/18 0407  HGB 9.9* 9.6*  WBC 13.0* 11.8*  PLT 220 203    Recent Labs    07/08/18 0528 07/09/18 0407  NA 138 135  K 4.0 3.3*  CL 107 107  CO2 25 22  BUN 7 5*  CREATININE 1.32* 1.18  CALCIUM 8.6* 8.4*  GFRNONAA 59* >60  GFRAA >60 >60     Recent Labs    07/08/18 0528  INR 1.14     Invalid input(s):  ABG  I reviewed the patient's sigmoidoscopy results.  Assessment:   Adenocarcinoma the prostate, diagnosed in late 2017, completing treatment, 44 fractions of external beam radiotherapy in August, 2018.  He had a low risk prostate cancer, with his nadir PSA from what  I can see being 1.1 (initially 5.5) in November, 2018.  Currently, it is 2.8, obviously a jump.  He may well have recurrent prostate cancer.  However, with a PSA of only 2.8, it is unlikely that he has large volume recurrence, however.  This elevation of the PSA may be directly related to recurrence of his cancer it also can be related to adjacent inflammatory process not of prostate etiology.  However, he does have an inflammatory process palpable on the anterior rectal wall which is also producing his hematochezia.  Plan: I agree with sigmoidoscopy and careful biopsy, realizing that this does somewhat increase the risk of rectourethral fistula.  Biopsying the margins of this would lessen the risk of that.  I also agree with MRI of the pelvis.  If the above 2 studies do not help elucidate the underlying process, transrectal ultrasound and biopsy of the prostate would be an option.  I have discussed this process with the patient and his wife at length today (45 minutes spent  with the patient and his wife, more than 50% of this time in face-to-face consultation).  We will follow with you during the remainder of his hospitalization and provide outpatient follow-up as well.     Pager:[phone]

## 2018-07-09 NOTE — Progress Notes (Addendum)
[facility]/Trauma Progress Note      Assessment/Plan Active Problems:   Lower GI bleed   HTN (hypertension)   HLD (hyperlipidemia)   Prostate cancer (HCC)  Rectal bleeding - large ulcerated, bleeding lesion seen on sigmoidoscopic exam at outside hospital, no biopsy taken - GI recommends checking PSA and has signed off - Hgb is stable  FEN: FLD VTE: SCD's ID: Flagyl 11/27>> Foley: none Follow up: TBD  Plan: recommend bowel regiment to keep bowels soft. Regular diet.  No emergent surgical indication at this time if H&H remain stable. Recommend GI consider APC if bleeding continues. Dr. Dema Severin to see.    LOS: 2 days    Subjective: CC: rectal pain  Pt states pain and bleeding has improved and he wants to go home. No issues overnight.   Objective: Vital signs in last 24 hours: Temp:  [99.3 F (37.4 C)-99.6 F (37.6 C)] 99.3 F (37.4 C) (11/29 0507) Pulse Rate:  [76-111] 76 (11/29 0507) Resp:  [16-18] 18 (11/29 0507) BP: (117-143)/(61-86) 143/71 (11/29 0507) SpO2:  [100 %] 100 % (11/29 0507) Last BM Date: 07/06/18  Intake/Output from previous day: 11/28 0701 - 11/29 0700 In: -  Out: 250 [Urine:250] Intake/Output this shift: No intake/output data recorded.  PE: Gen:  Alert, NAD, pleasant, cooperative Pulm:  Rate and effort normal Skin: no rashes noted, warm and dry   Anti-infectives: Anti-infectives (From admission, onward)   Start     Dose/Rate Route Frequency Ordered Stop   07/07/18 1900  metroNIDAZOLE (FLAGYL) IVPB 500 mg     500 mg 100 mL/hr over 60 Minutes Intravenous Every 8 hours 07/07/18 1739     07/07/18 1830  ciprofloxacin (CIPRO) IVPB 400 mg     400 mg 200 mL/hr over 60 Minutes Intravenous Every 12 hours 07/07/18 1739        Lab Results:  Recent Labs    07/08/18 0528 07/09/18 0407  WBC 13.0* 11.8*  HGB 9.9* 9.6*  HCT 31.7* 30.0*  PLT 220 203   BMET Recent Labs    07/08/18 0528 07/09/18 0407  NA 138 135  K 4.0  3.3*  CL 107 107  CO2 25 22  GLUCOSE 109* 120*  BUN 7 5*  CREATININE 1.32* 1.18  CALCIUM 8.6* 8.4*   PT/INR Recent Labs    07/08/18 0528  LABPROT 14.5  INR 1.14   CMP     Component Value Date/Time   NA 135 07/09/2018 0407   K 3.3 (L) 07/09/2018 0407   CL 107 07/09/2018 0407   CO2 22 07/09/2018 0407   GLUCOSE 120 (H) 07/09/2018 0407   BUN 5 (L) 07/09/2018 0407   CREATININE 1.18 07/09/2018 0407   CALCIUM 8.4 (L) 07/09/2018 0407   PROT 6.3 (L) 07/08/2018 0528   ALBUMIN 2.6 (L) 07/08/2018 0528   AST 20 07/08/2018 0528   ALT 14 07/08/2018 0528   ALKPHOS 76 07/08/2018 0528   BILITOT 1.0 07/08/2018 0528   GFRNONAA >60 07/09/2018 0407   GFRAA >60 07/09/2018 0407   Lipase  No results found for: LIPASE  Studies/Results: Dg Chest Port 1 View  Result Date: 07/07/2018 CLINICAL DATA:  57 y/o  M; fever. EXAM: PORTABLE CHEST 1 VIEW COMPARISON:  None. FINDINGS: The heart size and mediastinal contours are within normal limits. Both lungs are clear. The visualized skeletal structures are unremarkable. IMPRESSION: No active disease. Electronically Signed   By: Kristine Garbe M.D.   On: 07/07/2018 19:19  Kalman Drape , [facility] Surgery 07/09/2018, 8:09 AM  Pager: [phone] Mon-Wed, Friday 7:00am-4:30pm Thurs 7am-11:30am  Consults: [phone]

## 2018-07-09 NOTE — Progress Notes (Signed)
PROGRESS NOTE                                                                                                                                                                                                             Patient Demographics:    Phillip Blackwell, is a 57 y.o. male, DOB - 10-Sep-1960, [id number]  Admit date - 07/07/2018   Admitting Physician Ivor Costa, MD  Outpatient Primary MD for the patient is System, Pcp Not In  LOS - 2  CC - BPR     Brief Narrative  Phillip Blackwell is a 57 y.o. male with medical history significant of prostate cancer status post radiation therapy completed in August 2018, hypertension, hyperlipidemia, who presents to the hospital with chief complaint of bright red blood per rectum since Feb 2019. He had a Flexible sigmoidoscopy performed last week at a [facility], findings: "Rectal exam revealed for masslike tissue on palpation.  Upon entry, the into the rectum was found to be necrotic, deep and ulcerated extending from the dentate line to 7 cm from the insertion point.  Query radiation changes versus recurrence of prostate cancer versus rectal primary.   Subjective:   Patient in bed, appears comfortable, denies any headache, no fever, no chest pain or pressure, no shortness of breath , no abdominal pain. No focal weakness.   Assessment  & Plan :     1.  Red blood punctum ongoing for several months causing subacute lower GI bleeding related anemia.  This most likely is due to rectal erosion either by prostate cancer or by radiation proctitis.  Case discussed with GI physician Dr. Cristina Gong who saw the patient on 07/07/2018, he will be placed on soft diet, stool softeners, continue monitoring H&H, type screen has been done, hemoglobin is slightly down trending suggesting minute amounts of ongoing bleeding, check anemia panel, colorectal surgeon to evaluate.  2.  Prostate cancer.  Status post radiation treatments, PSA stable at 2.6.  Outpatient  follow-up with urology if needed.  3.  Dyslipidemia.  On statin.  4.  Intermittent history of hiccups.  PRN Thorazine.    Family Communication  :  Daughter sleeping bedside  Code Status :  Full  Disposition Plan  :  TBD  Consults  :  CCS and GI  Procedures  :  None  DVT Prophylaxis  :  SCDs    Lab Results  Component Value Date   PLT 203 07/09/2018    Diet :  Diet  Order            Diet full liquid Room service appropriate? Yes; Fluid consistency: Thin  Diet effective now               Inpatient Medications Scheduled Meds: . atorvastatin  40 mg Oral Daily  . clonazePAM  1 mg Oral QHS  . DULoxetine  60 mg Oral QPM  . feeding supplement (ENSURE ENLIVE)  237 mL Oral BID BM  . prazosin  2 mg Oral QHS  . tamsulosin  0.4 mg Oral Daily   Continuous Infusions: . chlorproMAZINE (THORAZINE) IV 25 mg (07/08/18 0952)  . ciprofloxacin 400 mg (07/09/18 0559)  . lactated ringers 75 mL/hr at 07/09/18 0558  . metronidazole 500 mg (07/09/18 0331)   PRN Meds:.acetaminophen, chlorproMAZINE (THORAZINE) IV, oxyCODONE  Antibiotics  :   Anti-infectives (From admission, onward)   Start     Dose/Rate Route Frequency Ordered Stop   07/07/18 1900  metroNIDAZOLE (FLAGYL) IVPB 500 mg     500 mg 100 mL/hr over 60 Minutes Intravenous Every 8 hours 07/07/18 1739     07/07/18 1830  ciprofloxacin (CIPRO) IVPB 400 mg     400 mg 200 mL/hr over 60 Minutes Intravenous Every 12 hours 07/07/18 1739         Objective:   Vitals:   07/08/18 0600 07/08/18 1411 07/08/18 2134 07/09/18 0507  BP: 127/89 117/61 132/86 (!) 143/71  Pulse: (!) 104 92 (!) 111 76  Resp: 16 16 18 18   Temp: 98.3 F (36.8 C) 99.6 F (37.6 C) 99.5 F (37.5 C) 99.3 F (37.4 C)  TempSrc: Oral Oral Oral Oral  SpO2: 97% 100% 100% 100%    Wt Readings from Last 3 Encounters:  08/22/16 97.1 kg     Intake/Output Summary (Last 24 hours) at 07/09/2018 0958 Last data filed at 07/09/2018 0900 Gross per 24 hour  Intake  520 ml  Output 250 ml  Net 270 ml     Physical Exam  Awake Alert, Oriented X 3, No new F.N deficits, Normal affect [place].AT,PERRAL Supple Neck,No JVD, No cervical lymphadenopathy appriciated.  Symmetrical Chest wall movement, Good air movement bilaterally, CTAB RRR,No Gallops, Rubs or new Murmurs, No Parasternal Heave +ve B.Sounds, Abd Soft, No tenderness, No organomegaly appriciated, No rebound - guarding or rigidity. No Cyanosis, Clubbing or edema, No new Rash or bruise     Data Review:    CBC Recent Labs  Lab 07/07/18 1912 07/08/18 0528 07/09/18 0407  WBC 14.1* 13.0* 11.8*  HGB 11.0* 9.9* 9.6*  HCT 33.7* 31.7* 30.0*  PLT 231 220 203  MCV 109.1* 110.8* 108.3*  MCH 35.6* 34.6* 34.7*  MCHC 32.6 31.2 32.0  RDW 12.1 11.9 11.7    Chemistries  Recent Labs  Lab 07/07/18 1912 07/08/18 0528 07/09/18 0407  NA 136 138 135  K 3.6 4.0 3.3*  CL 109 107 107  CO2 20* 25 22  GLUCOSE 116* 109* 120*  BUN 5* 7 5*  CREATININE 1.04 1.32* 1.18  CALCIUM 8.7* 8.6* 8.4*  MG  --   --  1.9  AST 21 20  --   ALT 15 14  --   ALKPHOS 89 76  --   BILITOT 0.9 1.0  --    ------------------------------------------------------------------------------------------------------------------ No results for input(s): CHOL, HDL, LDLCALC, TRIG, CHOLHDL, LDLDIRECT in the last 72 hours.  No results found for: HGBA1C ------------------------------------------------------------------------------------------------------------------ No results for input(s): TSH, T4TOTAL, T3FREE, THYROIDAB in the last 72 hours.  Invalid input(s):  FREET3 ------------------------------------------------------------------------------------------------------------------ No results for input(s): VITAMINB12, FOLATE, FERRITIN, TIBC, IRON, RETICCTPCT in the last 72 hours.  Coagulation profile Recent Labs  Lab 07/08/18 0528  INR 1.14    No results for input(s): DDIMER in the last 72 hours.  Cardiac Enzymes No results  for input(s): CKMB, TROPONINI, MYOGLOBIN in the last 168 hours.  Invalid input(s): CK ------------------------------------------------------------------------------------------------------------------ No results found for: BNP  Micro Results Recent Results (from the past 240 hour(s))  Culture, blood (Routine X 2) w Reflex to ID Panel     Status: None (Preliminary result)   Collection Time: 07/07/18  7:12 PM  Result Value Ref Range Status   Specimen Description BLOOD LEFT ANTECUBITAL  Final   Special Requests   Final    BOTTLES DRAWN AEROBIC AND ANAEROBIC Blood Culture adequate volume   Culture   Final    NO GROWTH 2 DAYS Performed at [facility], [address] [place], [place] [address]    Report Status PENDING  Incomplete  Culture, blood (Routine X 2) w Reflex to ID Panel     Status: None (Preliminary result)   Collection Time: 07/07/18  7:12 PM  Result Value Ref Range Status   Specimen Description BLOOD RIGHT ANTECUBITAL  Final   Special Requests   Final    BOTTLES DRAWN AEROBIC AND ANAEROBIC Blood Culture adequate volume   Culture   Final    NO GROWTH 2 DAYS Performed at [facility], [place] [address], [place], [place] [address]    Report Status PENDING  Incomplete    Radiology Reports Dg Chest Port 1 View  Result Date: 07/07/2018 CLINICAL DATA:  57 y/o  M; fever. EXAM: PORTABLE CHEST 1 VIEW COMPARISON:  None. FINDINGS: The heart size and mediastinal contours are within normal limits. Both lungs are clear. The visualized skeletal structures are unremarkable. IMPRESSION: No active disease. Electronically Signed   By: Kristine Garbe M.D.   On: 07/07/2018 19:19    Time Spent in minutes  30   Lala Lund M.D on 07/09/2018 at 9:58 AM  To page go to [email or web address] - password [email or web address]

## 2018-07-09 NOTE — Progress Notes (Signed)
Initial Nutrition Assessment  DOCUMENTATION CODES:   Non-severe (moderate) malnutrition in context of chronic illness  INTERVENTION:  - Will advance to Regular diet, per discussion with Dr. Candiss Norse. - Continue Ensure Enlive BID, each supplement provides 350 kcal and 20 grams of protein. - Will trial Boost Breeze once/day, this supplement provides 250 kcal and 9 grams of protein. - Will order daily multivitamin with minerals. - Continue to encourage PO intakes.   NUTRITION DIAGNOSIS:   Moderate Malnutrition related to chronic illness(chronic BRBPR, hx prostate cancer s/p radiation) as evidenced by mild fat depletion, mild muscle depletion.  GOAL:   Patient will meet greater than or equal to 90% of their needs  MONITOR:   PO intake, Diet advancement, Supplement acceptance, Weight trends, Labs, I & O's  REASON FOR ASSESSMENT:   Malnutrition Screening Tool  ASSESSMENT:   57 y.o. male with medical history significant of prostate cancer s/p radiation completed in 8/ 2018, HTN, hyperlipidemia. He presented to the ED with complaints of BRBPR since 09/2017. He had flexible sigmoidoscopy performed last week at a [facility], findings: "Rectal exam revealed for mass-like tissue on palpation. Upon entry, the into the rectum was found to be necrotic, deep and ulcerated. Query radiation changes versus recurrence of prostate cancer versus rectal primary."  Patient on FLD with no intakes documented. Spoke with Surgery PA and Dr. Candiss Norse; Dr. Candiss Norse states okay to advance to Regular diet. Patient had received lunch during RD visit and was asking about when he would be able to have solid food. Order for Ensure Jeanne Ivan was placed on 11/27 and patient accepted 2 of 4 bottles offered since that time.   Patient denies any abdominal pain or nausea now or PTA. He states that bleeding has slowed/decreased. He reports that PO intakes have decreased in the past few months and does admit that this is d/t bleeding  and having fear/being nervous about eating leading to further issues with bleeding or with other issues with BMs.   He states that weight a few weeks ago was 195 lb (88.64 kg). He indicates that each time he goes to the doctor's office his weight has dropped 2-3 lb, but he is unable to recall a starting weight or when he began to notice this trend.    Medications reviewed; 40 mEq K-Dur x1 dose today.  Labs reviewed; K: 3.3 mmol/L, BUN: 5 mg/dL, Ca: 8.4 mg/dL.     NUTRITION - FOCUSED PHYSICAL EXAM:    Most Recent Value  Orbital Region  No depletion  Upper Arm Region  Mild depletion  Thoracic and Lumbar Region  Mild depletion  Buccal Region  Mild depletion  Temple Region  No depletion  Clavicle Bone Region  Mild depletion  Clavicle and Acromion Bone Region  Mild depletion  Scapular Bone Region  No depletion  Dorsal Hand  No depletion  Patellar Region  No depletion  Anterior Thigh Region  Unable to assess  Posterior Calf Region  Mild depletion  Edema (RD Assessment)  None  Hair  Reviewed  Eyes  Reviewed  Mouth  Reviewed  Skin  Reviewed  Nails  Reviewed       Diet Order:   Diet Order            Diet full liquid Room service appropriate? Yes; Fluid consistency: Thin  Diet effective now              EDUCATION NEEDS:   No education needs have been identified at this time  Skin:  Skin Assessment: Reviewed RN Assessment  Last BM:  11/26 (the day PTA)  Height:   Ht Readings from Last 1 Encounters:  08/22/16 6' (1.829 m)    Weight:   Wt Readings from Last 1 Encounters:  08/22/16 97.1 kg    Ideal Body Weight:  80.91 kg  BMI:  There is no height or weight on file to calculate BMI.  Estimated Nutritional Needs:   Kcal:  2040-2215 kcal  Protein:  90-105 grams  Fluid:  >/= 2 L/day     Jarome Matin, MS, RD, LDN, [facility] Inpatient Clinical Dietitian Pager # [phone] After hours/weekend pager # [phone]

## 2018-07-10 ENCOUNTER — Inpatient Hospital Stay (HOSPITAL_COMMUNITY): Payer: No Typology Code available for payment source

## 2018-07-10 ENCOUNTER — Encounter (HOSPITAL_COMMUNITY)
Admission: AD | Disposition: A | Discharge status: Home / Self Care | Payer: Self-pay | Source: Other Acute Inpatient Hospital | Attending: Internal Medicine

## 2018-07-10 ENCOUNTER — Encounter (HOSPITAL_COMMUNITY): Payer: Self-pay | Admitting: *Deleted

## 2018-07-10 HISTORY — PX: BIOPSY: SHX5522

## 2018-07-10 HISTORY — PX: FLEXIBLE SIGMOIDOSCOPY: SHX5431

## 2018-07-10 LAB — BASIC METABOLIC PANEL
Anion gap: 11 (ref 5–15)
BUN: 5 mg/dL — ABNORMAL LOW (ref 6–20)
CO2: 22 mmol/L (ref 22–32)
Calcium: 8.8 mg/dL — ABNORMAL LOW (ref 8.9–10.3)
Chloride: 102 mmol/L (ref 98–111)
Creatinine, Ser: 1.31 mg/dL — ABNORMAL HIGH (ref 0.61–1.24)
GFR, EST NON AFRICAN AMERICAN: 59 mL/min — AB (ref >60)
Glucose, Bld: 100 mg/dL — ABNORMAL HIGH (ref 70–99)
POTASSIUM: 3.6 mmol/L (ref 3.5–5.1)
Sodium: 135 mmol/L (ref 135–145)

## 2018-07-10 LAB — CBC
HCT: 32.2 % — ABNORMAL LOW (ref 39.0–52.0)
HEMOGLOBIN: 10 g/dL — AB (ref 13.0–17.0)
MCH: 34 pg (ref 26.0–34.0)
MCHC: 31.1 g/dL (ref 30.0–36.0)
MCV: 109.5 fL — ABNORMAL HIGH (ref 80.0–100.0)
Platelets: 247 10*3/uL (ref 150–400)
RBC: 2.94 MIL/uL — ABNORMAL LOW (ref 4.22–5.81)
RDW: 11.7 % (ref 11.5–15.5)
WBC: 10.7 10*3/uL — ABNORMAL HIGH (ref 4.0–10.5)
nRBC: 0 % (ref 0.0–0.2)

## 2018-07-10 LAB — URINE CULTURE: CULTURE: NO GROWTH

## 2018-07-10 SURGERY — SIGMOIDOSCOPY, FLEXIBLE
Anesthesia: Moderate Sedation

## 2018-07-10 MED ORDER — TAMSULOSIN HCL 0.4 MG PO CAPS
0.4000 mg | ORAL_CAPSULE | Freq: Every day | ORAL | Status: DC
  Filled 2018-07-10: qty 1

## 2018-07-10 MED ORDER — FERROUS SULFATE 325 (65 FE) MG PO TABS
325.0000 mg | ORAL_TABLET | Freq: Two times a day (BID) | ORAL | Status: DC
  Administered 2018-07-10 – 2018-07-11 (×2): 325 mg via ORAL
  Filled 2018-07-10 (×2): qty 1

## 2018-07-10 MED ORDER — HYDRALAZINE HCL 20 MG/ML IJ SOLN
5.0000 mg | Freq: Once | INTRAMUSCULAR | Status: AC
  Administered 2018-07-10: 5 mg via INTRAVENOUS
  Filled 2018-07-10: qty 1

## 2018-07-10 MED ORDER — GADOBUTROL 1 MMOL/ML IV SOLN
9.0000 mL | Freq: Once | INTRAVENOUS | Status: AC | PRN
  Administered 2018-07-10: 9 mL via INTRAVENOUS

## 2018-07-10 MED ORDER — SODIUM CHLORIDE 0.9 % IV SOLN: INTRAVENOUS | Status: DC

## 2018-07-10 NOTE — Op Note (Addendum)
[facility] Patient Name: Phillip Blackwell Procedure Date : 07/10/2018 MRN: [id number] Attending MD: Otis Brace , MD Date of Birth: 18-Jan-1961 CSN: [id number] Age: 57 Admit Type: Inpatient Procedure:                Flexible Sigmoidoscopy Indications:              Rectal hemorrhage, Rectal Ulcer Providers:                Otis Brace, MD, Elmer Ramp. Tilden Dome, RN, Laverda Sorenson, Technician Referring MD:             Dr. Dema Severin Medicines:                None Complications:            No immediate complications. Estimated Blood Loss:     Estimated blood loss was minimal. Procedure:                Pre-Anesthesia Assessment:                           - N/A                           After obtaining informed consent, the scope was                            passed under direct vision. The GIF-H190 ([id number])                            Olympus adult EGD was introduced through the anus                            and advanced to the 30 cm from the anal verge. The                            flexible sigmoidoscopy was accomplished without                            difficulty. The patient tolerated the procedure                            well. The quality of the bowel preparation was poor. Scope In: Scope Out: Findings:      The perianal exam findings include indurated area at distal rectum.      A single (solitary) deep ulcer was found in the distal rectum. Stigmata       of recent bleeding were present. Biopsies with peds biopsy forcep were       taken with a cold forceps for histology.      - Evidence of recent bleeding was noted. Exam was otherwise normal up to       30 cm except for poor prep Impression:               - Preparation of the colon was poor.                           -  Indurated area at distal rectum found on perianal                            exam.                           - A single (solitary) ulcer in the distal rectum.                       Biopsied. Recommendation:           - Return patient to hospital ward for ongoing care.                           - Resume previous diet.                           - Await pathology results. Procedure Code(s):        --- Professional ---                           [phone], Sigmoidoscopy, flexible; with biopsy, single                            or multiple Diagnosis Code(s):        --- Professional ---                           K62.6, Ulcer of anus and rectum                           K62.5, Hemorrhage of anus and rectum CPT copyright 2018 American Medical Association. All rights reserved. The codes documented in this report are preliminary and upon coder review may  be revised to meet current compliance requirements. Otis Brace, MD Otis Brace, MD 07/10/2018 9:54:13 AM Number of Addenda: 0

## 2018-07-10 NOTE — Progress Notes (Signed)
Pt. abdomen taut and complained of gas after flex sigmoidoscopy and pelvic MRI. Physician notified. Physician indicated patient should walk around to relieve gas. Pt. Informed of need to walk around.

## 2018-07-10 NOTE — Progress Notes (Signed)
PROGRESS NOTE                                                                                                                                                                                                             Patient Demographics:    Phillip Blackwell, is a 57 y.o. male, DOB - 07-28-1961, ZOX:[id number]  Admit date - 07/07/2018   Admitting Physician Ivor Costa, MD  Outpatient Primary MD for the patient is System, Pcp Not In  LOS - 3  CC - BPR     Brief Narrative  Phillip Blackwell is a 57 y.o. male with medical history significant of prostate cancer status post radiation therapy completed in August 2018, hypertension, hyperlipidemia, who presents to the hospital with chief complaint of bright red blood per rectum since Feb 2019. He had a Flexible sigmoidoscopy performed last week at a [facility], findings: "Rectal exam revealed for masslike tissue on palpation.  Upon entry, the into the rectum was found to be necrotic, deep and ulcerated extending from the dentate line to 7 cm from the insertion point.  Query radiation changes versus recurrence of prostate cancer versus rectal primary.   Subjective:   Patient in bed, appears comfortable, denies any headache, no fever, no chest pain or pressure, no shortness of breath , no abdominal pain. No focal weakness.    Assessment  & Plan :     1.  Red blood punctum ongoing for several months causing subacute lower GI bleeding related anemia.  This most likely is due to rectal erosion either by prostate cancer or by radiation proctitis.  Currently he is being followed by GI, urology and CCS.  He underwent flexible sigmoidoscopy on 07/10/2018 showing a large rectal, biopsies were obtained and results are pending.  For now his H&H is stable, placed on oral iron, anemia panel rather satisfactory except for saturation ratios being low, MRI pelvis pending as suggested by urology and GI.  Continue to monitor.  Defer further care to urology  and general surgery along with GI.   2.  Prostate cancer.  Status post radiation treatments, PSA stable at 2.6.  Urology following.  3.  Dyslipidemia.  On statin.  4.  Intermittent history of hiccups.  PRN Thorazine.    Family Communication  :  Daughter sleeping bedside  Code Status :  Full  Disposition Plan  :  TBD  Consults  :  Urology, CCS and GI  Procedures  :    MRI Pelvis -  Flex sigmoidoscopy done on 07/10/2018.  Poor prep limiting visualization.  Scope was advanced to 30 cm. Large deep rectal ulcer was noted in the distal rectum.  Biopsies with pediatric biopsy forceps  taken from ulcer edge.   DVT Prophylaxis  :  SCDs    Lab Results  Component Value Date   PLT 247 07/10/2018    Diet :  Diet Order            Diet NPO time specified  Diet effective now               Inpatient Medications Scheduled Meds: . atorvastatin  40 mg Oral Daily  . clonazePAM  1 mg Oral QHS  . DULoxetine  60 mg Oral QPM  . feeding supplement  1 Container Oral Q24H  . feeding supplement (ENSURE ENLIVE)  237 mL Oral BID BM  . multivitamin with minerals  1 tablet Oral Daily  . polyethylene glycol  17 g Oral Daily  . prazosin  2 mg Oral QHS  . tamsulosin  0.4 mg Oral Daily   Continuous Infusions: . chlorproMAZINE (THORAZINE) IV Stopped (07/08/18 1022)  . ciprofloxacin 400 mg (07/10/18 0513)  . metronidazole Stopped (07/10/18 0340)   PRN Meds:.acetaminophen, chlorproMAZINE (THORAZINE) IV, LORazepam, oxyCODONE  Antibiotics  :   Anti-infectives (From admission, onward)   Start     Dose/Rate Route Frequency Ordered Stop   07/07/18 1900  metroNIDAZOLE (FLAGYL) IVPB 500 mg     500 mg 100 mL/hr over 60 Minutes Intravenous Every 8 hours 07/07/18 1739     07/07/18 1830  ciprofloxacin (CIPRO) IVPB 400 mg     400 mg 200 mL/hr over 60 Minutes Intravenous Every 12 hours 07/07/18 1739         Objective:   Vitals:   07/10/18 0930 07/10/18 0935 07/10/18 0940 07/10/18 0945  BP:   (!) 148/69 (!) 165/74 (!) 171/64  Pulse: 76 85 83 75  Resp: 17 16 (!) 25 (!) 28  Temp:      TempSrc:      SpO2: 99% 97% 99% 99%  Weight:      Height:        Wt Readings from Last 3 Encounters:  07/10/18 97 kg  08/22/16 97.1 kg     Intake/Output Summary (Last 24 hours) at 07/10/2018 1110 Last data filed at 07/10/2018 0854 Gross per 24 hour  Intake 3841.33 ml  Output -  Net 3841.33 ml     Physical Exam  Awake Alert, Oriented X 3, No new F.N deficits, Normal affect [place].AT,PERRAL Supple Neck,No JVD, No cervical lymphadenopathy appriciated.  Symmetrical Chest wall movement, Good air movement bilaterally, CTAB RRR,No Gallops, Rubs or new Murmurs, No Parasternal Heave +ve B.Sounds, Abd Soft, No tenderness, No organomegaly appriciated, No rebound - guarding or rigidity. No Cyanosis, Clubbing or edema, No new Rash or bruise    Data Review:    CBC Recent Labs  Lab 07/07/18 1912 07/08/18 0528 07/09/18 0407 07/10/18 0355  WBC 14.1* 13.0* 11.8* 10.7*  HGB 11.0* 9.9* 9.6* 10.0*  HCT 33.7* 31.7* 30.0* 32.2*  PLT 231 220 203 247  MCV 109.1* 110.8* 108.3* 109.5*  MCH 35.6* 34.6* 34.7* 34.0  MCHC 32.6 31.2 32.0 31.1  RDW 12.1 11.9 11.7 11.7    Chemistries  Recent Labs  Lab 07/07/18 1912 07/08/18 0528 07/09/18 0407 07/10/18 0355  NA 136 138 135 135  K 3.6 4.0 3.3* 3.6  CL 109 107 107 102  CO2 20* 25 22  22  GLUCOSE 116* 109* 120* 100*  BUN 5* 7 5* 5*  CREATININE 1.04 1.32* 1.18 1.31*  CALCIUM 8.7* 8.6* 8.4* 8.8*  MG  --   --  1.9  --   AST 21 20  --   --   ALT 15 14  --   --   ALKPHOS 89 76  --   --   BILITOT 0.9 1.0  --   --    ------------------------------------------------------------------------------------------------------------------ No results for input(s): CHOL, HDL, LDLCALC, TRIG, CHOLHDL, LDLDIRECT in the last 72 hours.  No results found for:  HGBA1C ------------------------------------------------------------------------------------------------------------------ No results for input(s): TSH, T4TOTAL, T3FREE, THYROIDAB in the last 72 hours.  Invalid input(s): FREET3 ------------------------------------------------------------------------------------------------------------------ Recent Labs    07/09/18 1019  VITAMINB12 752  FOLATE 16.0  FERRITIN 155  TIBC 340  IRON 19*  RETICCTPCT 4.0*    Coagulation profile Recent Labs  Lab 07/08/18 0528  INR 1.14    No results for input(s): DDIMER in the last 72 hours.  Cardiac Enzymes No results for input(s): CKMB, TROPONINI, MYOGLOBIN in the last 168 hours.  Invalid input(s): CK ------------------------------------------------------------------------------------------------------------------ No results found for: BNP  Micro Results Recent Results (from the past 240 hour(s))  Culture, blood (Routine X 2) w Reflex to ID Panel     Status: None (Preliminary result)   Collection Time: 07/07/18  7:12 PM  Result Value Ref Range Status   Specimen Description BLOOD LEFT ANTECUBITAL  Final   Special Requests   Final    BOTTLES DRAWN AEROBIC AND ANAEROBIC Blood Culture adequate volume   Culture   Final    NO GROWTH 3 DAYS Performed at [facility], [address], [place], [place] [address]    Report Status PENDING  Incomplete  Culture, blood (Routine X 2) w Reflex to ID Panel     Status: None (Preliminary result)   Collection Time: 07/07/18  7:12 PM  Result Value Ref Range Status   Specimen Description BLOOD RIGHT ANTECUBITAL  Final   Special Requests   Final    BOTTLES DRAWN AEROBIC AND ANAEROBIC Blood Culture adequate volume   Culture   Final    NO GROWTH 3 DAYS Performed at [facility], [place] [address] [place], [place] [address]    Report Status PENDING  Incomplete  Culture, Urine     Status: None   Collection Time: 07/08/18 10:40 PM  Result Value Ref  Range Status   Specimen Description URINE, RANDOM  Final   Special Requests NONE  Final   Culture   Final    NO GROWTH Performed at [facility], [place] [address], [place], [place] [address]    Report Status 07/10/2018 FINAL  Final    Radiology Reports Dg Chest Port 1 View  Result Date: 07/07/2018 CLINICAL DATA:  57 y/o  M; fever. EXAM: PORTABLE CHEST 1 VIEW COMPARISON:  None. FINDINGS: The heart size and mediastinal contours are within normal limits. Both lungs are clear. The visualized skeletal structures are unremarkable. IMPRESSION: No active disease. Electronically Signed   By: Kristine Garbe M.D.   On: 07/07/2018 19:19    Time Spent in minutes  30   Lala Lund M.D on 07/10/2018 at 11:10 AM  To page go to [email or web address] - password [email or web address]

## 2018-07-10 NOTE — Interval H&P Note (Signed)
History and Physical Interval Note:  07/10/2018 9:32 AM  [name]  has presented today for surgery, with the diagnosis of rectal bleeding  The various methods of treatment have been discussed with the patient and family. After consideration of risks, benefits and other options for treatment, the patient has consented to  Procedure(s) with comments: FLEXIBLE SIGMOIDOSCOPY (N/A) - Adult EGD Scope. Peds biopsy forceps. as a surgical intervention .  The patient's history has been reviewed, patient examined, no change in status, stable for surgery.  I have reviewed the patient's chart and labs.  Questions were answered to the patient's satisfaction.    Patient seen and examined in the endoscopy unit.  Procedure discussed in detail.  Risk of biopsies such as bleeding as well as future risk of fistula formation also discussed.  Patient verbalized understanding.  Risks (bleeding, infection, bowel perforation that could require surgery, sedation-related changes in cardiopulmonary systems), benefits (identification and possible treatment of source of symptoms, exclusion of certain causes of symptoms), and alternatives (watchful waiting, radiographic imaging studies, empiric medical treatment)  were explained to patient in detail and patient wishes to proceed. Jerl Munyan

## 2018-07-10 NOTE — Brief Op Note (Signed)
07/07/2018 - 07/10/2018  9:54 AM  PATIENT:  [name]  57 y.o. male  PRE-OPERATIVE DIAGNOSIS:  rectal bleeding  POST-OPERATIVE DIAGNOSIS:  rectal ulcer biopsy  PROCEDURE:  Procedure(s) with comments: FLEXIBLE SIGMOIDOSCOPY (N/A) - Adult EGD Scope. Peds biopsy forceps.  SURGEON:  Surgeon(s) and Role:    * Lidia Clavijo, MD - Primary  Findings ---------- -Poor prep limiting visualization.  Scope was advanced to 30 cm. -Large deep rectal ulcer was noted in the distal rectum.  Biopsies with pediatric biopsy forceps  taken from ulcer edge.   Recommendations ------------------------- -Advance diet from GI standpoint   May need to hold diet for MRI -Follow biopsy results. -GI will sign off.  Follow-up with primary GI after discharge.  Otis Brace MD, [place] 07/10/2018, 9:56 AM  Contact #  [phone]

## 2018-07-11 ENCOUNTER — Encounter (HOSPITAL_COMMUNITY): Payer: Self-pay | Admitting: Gastroenterology

## 2018-07-11 LAB — CBC
HCT: 30.4 % — ABNORMAL LOW (ref 39.0–52.0)
Hemoglobin: 9.8 g/dL — ABNORMAL LOW (ref 13.0–17.0)
MCH: 34.8 pg — ABNORMAL HIGH (ref 26.0–34.0)
MCHC: 32.2 g/dL (ref 30.0–36.0)
MCV: 107.8 fL — ABNORMAL HIGH (ref 80.0–100.0)
PLATELETS: 267 10*3/uL (ref 150–400)
RBC: 2.82 MIL/uL — ABNORMAL LOW (ref 4.22–5.81)
RDW: 11.8 % (ref 11.5–15.5)
WBC: 8.8 10*3/uL (ref 4.0–10.5)
nRBC: 0 % (ref 0.0–0.2)

## 2018-07-11 LAB — BASIC METABOLIC PANEL
Anion gap: 13 (ref 5–15)
BUN: 6 mg/dL (ref 6–20)
CO2: 23 mmol/L (ref 22–32)
Calcium: 8.6 mg/dL — ABNORMAL LOW (ref 8.9–10.3)
Chloride: 102 mmol/L (ref 98–111)
Creatinine, Ser: 1.05 mg/dL (ref 0.61–1.24)
GFR calc Af Amer: >60 mL/min (ref >60)
Glucose, Bld: 99 mg/dL (ref 70–99)
Potassium: 3.2 mmol/L — ABNORMAL LOW (ref 3.5–5.1)
Sodium: 138 mmol/L (ref 135–145)

## 2018-07-11 MED ORDER — DOCUSATE SODIUM 100 MG PO CAPS: 100.0000 mg | ORAL_CAPSULE | Freq: Every day | ORAL | 0 refills | Status: AC

## 2018-07-11 MED ORDER — FERROUS SULFATE 325 (65 FE) MG PO TABS: 325.0000 mg | ORAL_TABLET | Freq: Every day | ORAL | 0 refills | Status: DC

## 2018-07-11 NOTE — Progress Notes (Signed)
[facility] Progress Note  1 Day Post-Op  Subjective: CC:  No complaints, reports some confusion about surgical plan. tolerating PO. Having bowel movement, last BM was non-bloody   Objective: Vital signs in last 24 hours: Temp:  [99.2 F (37.3 C)-99.8 F (37.7 C)] 99.2 F (37.3 C) (12/01 0609) Pulse Rate:  [82-104] 82 (12/01 0609) Resp:  [16-22] 16 (12/01 0609) BP: (118-155)/(56-92) 118/56 (12/01 0609) SpO2:  [98 %-99 %] 98 % (12/01 0609) Last BM Date: 07/07/18  Intake/Output from previous day: No intake/output data recorded. Intake/Output this shift: No intake/output data recorded.  PE: Gen:  Alert, NAD, pleasant Card:  Regular rate and rhythm, pedal pulses 2+ BL Pulm:  Normal effort, clear to auscultation bilaterally Abd: Soft, non-tender, non-distended, bowel sounds present  Skin: warm and dry, no rashes  Psych: A&Ox3   Lab Results:  Recent Labs    07/10/18 0355 07/11/18 0508  WBC 10.7* 8.8  HGB 10.0* 9.8*  HCT 32.2* 30.4*  PLT 247 267   BMET Recent Labs    07/10/18 0355 07/11/18 0508  NA 135 138  K 3.6 3.2*  CL 102 102  CO2 22 23  GLUCOSE 100* 99  BUN 5* 6  CREATININE 1.31* 1.05  CALCIUM 8.8* 8.6*   PT/INR No results for input(s): LABPROT, INR in the last 72 hours. CMP     Component Value Date/Time   NA 138 07/11/2018 0508   K 3.2 (L) 07/11/2018 0508   CL 102 07/11/2018 0508   CO2 23 07/11/2018 0508   GLUCOSE 99 07/11/2018 0508   BUN 6 07/11/2018 0508   CREATININE 1.05 07/11/2018 0508   CALCIUM 8.6 (L) 07/11/2018 0508   PROT 6.3 (L) 07/08/2018 0528   ALBUMIN 2.6 (L) 07/08/2018 0528   AST 20 07/08/2018 0528   ALT 14 07/08/2018 0528   ALKPHOS 76 07/08/2018 0528   BILITOT 1.0 07/08/2018 0528   GFRNONAA >60 07/11/2018 0508   GFRAA >60 07/11/2018 0508   Lipase  No results found for: LIPASE     Studies/Results: Mr Pelvis W Wo Contrast  Result Date: 07/10/2018 CLINICAL DATA:  History of prostate cancer.  Rectal  bleeding. EXAM: MRI PELVIS WITHOUT AND WITH CONTRAST TECHNIQUE: Multiplanar multisequence MR imaging of the pelvis was performed both before and after administration of intravenous contrast. CONTRAST:  9 cc of Gadavist. COMPARISON:  None. FINDINGS: Urinary Tract:  No abnormality visualized. Bowel: No abnormal dilatation of the distal rectum. Along the anterior wall of the rectum posterior to the prostate gland there is large ulceration measuring 2.5 cm with associated ulcer cavity. The ulcer cavity measures 4.3 cm transverse, 3.5 cm cranial caudal and 1.4 cm AP. Marked surrounding inflammation with increased T2 signal and enhancement which extends ventrally to involve the prostate gland and seminal vesicles and laterally to involve the obturator internus and externus muscles bilaterally compatible with myositis. Sinus tracts extend bilaterally to involve bilateral operator internists muscles, image 19/9. Vascular/Lymphatic: No pathologically enlarged lymph nodes. No significant vascular abnormality seen. Reproductive: There is diffuse increased T1 signal and enhancement throughout the prostate gland and seminal vesicles compatible with inflammation. Small bilateral hydroceles are identified. Other: No free fluid identified within the pelvis. There is a left inguinal hernia which contains fat only, image 30/2. Small Musculoskeletal: No suspicious bone lesions identified. IMPRESSION: 1. There is a large defect involving the ventral wall of the distal rectum posterior to the prostate gland. A large ulcer cavity containing gas, fluid and debris is identified between  the ventral wall of the rectum and posterior wall of the prostate gland. Extensive surrounding inflammatory change is identified with myositis of bilateral obturator internus and externus muscles bilaterally. Small sinus tracks extend laterally from this fluid collection to involve bilateral pelvic floor musculature including the operator and peer foremost  muscles. There is associated inflammation of the prostate gland and bilateral seminal vesicles. Electronically Signed   By: Kerby Moors M.D.   On: 07/10/2018 14:22    Anti-infectives: Anti-infectives (From admission, onward)   Start     Dose/Rate Route Frequency Ordered Stop   07/07/18 1900  metroNIDAZOLE (FLAGYL) IVPB 500 mg     500 mg 100 mL/hr over 60 Minutes Intravenous Every 8 hours 07/07/18 1739     07/07/18 1830  ciprofloxacin (CIPRO) IVPB 400 mg     400 mg 200 mL/hr over 60 Minutes Intravenous Every 12 hours 07/07/18 1739       Assessment/Plan   Lower GI bleed   HTN (hypertension)   HLD (hyperlipidemia)   Prostate cancer (HCC) - s/p radiation   Rectal bleeding from a rectal ulcer - flex sig negative for active bleeding. Biopsies taken. Denies rectal bleeding past 24h.  - follow biopsy results  - Patient will likely benefit from non-urgent permanent diverting colostomy.  - Will discuss surgical plan and necessary outpatient follow up with Dr. Dema Severin    LOS: 4 days    Obie Dredge, [facility] Pager: [phone]

## 2018-07-11 NOTE — Discharge Summary (Signed)
Phillip Blackwell KXF:[id number] DOB: 1960-11-04 DOA: 07/07/2018  PCP: System, Pcp Not In  Admit date: 07/07/2018  Discharge date: 07/11/2018  Admitted From: Home   Disposition:  Home   Recommendations for Outpatient Follow-up:   Follow up with PCP in 1-2 weeks  PCP Please obtain BMP/CBC, 2 view CXR in 1week,  (see Discharge instructions)   PCP Please follow up on the following pending results:    Home Health: None   Equipment/Devices: None  Consultations: CCS, GI, Urology Discharge Condition: Fair   CODE STATUS: Full   Diet Recommendation: Heart Healthy   CC -  BRPR  Brief history of present illness from the day of admission and additional interim summary     Phillip Blackwell a 57 y.o.malewith medical history significant ofprostate cancer status post radiation therapy completed in August 2018, hypertension, hyperlipidemia, who presents to the hospital with chief complaint of bright red blood per rectum since Feb 2019. He had a Flexible sigmoidoscopy performed last week at a [place] hospital,findings:"Rectal exam revealed for masslike tissue on palpation. Upon entry, the into the rectum was found to be necrotic, deep and ulcerated extending from the dentate line to 7 cm from the insertion point. Query radiation changes versus recurrence of prostate cancer versus rectal primary.                                                                 Hospital Course     1.  Red blood punctum ongoing for several months causing subacute lower GI bleeding related anemia.  This most likely is due to rectal erosion either by prostate cancer or by radiation proctitis.  Currently he is being followed by GI, urology and CCS.  He underwent flexible sigmoidoscopy on 07/10/2018 showing a large rectal, biopsies were obtained and  results are pending.  For now his H&H is stable, placed on oral iron, anemia panel rather satisfactory except for saturation ratios being low, MRI pelvis is also reviewed and discussed by me with general surgeon Dr. Dema Severin and urologist Dr. Jeffie Pollock, per Dr. Dema Severin no surgery needed for now, patient to follow-up with him in 1 to [redacted] weeks along with GI.  Request PCP to monitor H&H and electively transfuse outpatient if needed.   2.  Prostate cancer.  Status post radiation treatments, PSA stable at 2.6.  Urology saw the patient here no intervention per urology.  3.  Dyslipidemia.  On statin.  4.  Intermittent history of hiccups.  PRN Thorazine to be continued per home regimen.  5.  Hypokalemia.  Replaced.    Discharge diagnosis     Active Problems:   Lower GI bleed   HTN (hypertension)   HLD (hyperlipidemia)   Prostate cancer [facility])    Discharge instructions    Discharge Instructions  Diet - low sodium heart healthy   Complete by:  As directed    Discharge instructions   Complete by:  As directed    Follow with Primary MD System, Pcp Not In in 7 days   Get CBC, CMP, 2 view Chest X ray -  checked  by Primary MD  in 5-7 days    Activity: As tolerated with Full fall precautions use walker/cane & assistance as needed  Disposition Home   Diet: Heart Healthy    Special Instructions: If you have smoked or chewed Tobacco  in the last 2 yrs please stop smoking, stop any regular Alcohol  and or any Recreational drug use.  On your next visit with your primary care physician please Get Medicines reviewed and adjusted.  Please request your Prim.MD to go over all Hospital Tests and Procedure/Radiological results at the follow up, please get all Hospital records sent to your Prim MD by signing hospital release before you go home.  If you experience worsening of your admission symptoms, develop shortness of breath, life threatening emergency, suicidal or homicidal thoughts you must seek  medical attention immediately by calling 911 or calling your MD immediately  if symptoms less severe.  You Must read complete instructions/literature along with all the possible adverse reactions/side effects for all the Medicines you take and that have been prescribed to you. Take any new Medicines after you have completely understood and accpet all the possible adverse reactions/side effects.   Increase activity slowly   Complete by:  As directed       Discharge Medications   Allergies as of 07/11/2018   No Known Allergies     Medication List    STOP taking these medications   diclofenac 75 MG EC tablet Commonly known as:  VOLTAREN     TAKE these medications   amLODipine 10 MG tablet Commonly known as:  NORVASC Take 10 mg by mouth daily.   aspirin 81 MG chewable tablet Chew 81 mg by mouth daily.   atorvastatin 40 MG tablet Commonly known as:  LIPITOR Take 40 mg by mouth daily with supper.   chlorproMAZINE 25 MG tablet Commonly known as:  THORAZINE Take 25 mg by mouth at bedtime.   clonazePAM 1 MG tablet Commonly known as:  KLONOPIN Take 1 mg by mouth at bedtime.   docusate sodium 100 MG capsule Commonly known as:  COLACE Take 1 capsule (100 mg total) by mouth daily.   DULoxetine 60 MG capsule Commonly known as:  CYMBALTA Take 60 mg by mouth daily with supper.   ferrous sulfate 325 (65 FE) MG tablet Take 1 tablet (325 mg total) by mouth daily with breakfast.   LACTOBACILLUS PO Take 1 tablet by mouth daily.   lisinopril 40 MG tablet Commonly known as:  PRINIVIL,ZESTRIL Take 40 mg by mouth daily.   metoprolol tartrate 100 MG tablet Commonly known as:  LOPRESSOR Take 100 mg by mouth 2 (two) times daily.   multivitamin with minerals tablet Take 1 tablet by mouth daily.   omeprazole 40 MG capsule Commonly known as:  PRILOSEC Take 40 mg by mouth daily.   prazosin 1 MG capsule Commonly known as:  MINIPRESS Take 2 mg by mouth at bedtime.   tamsulosin  0.4 MG Caps capsule Commonly known as:  FLOMAX Take 0.4 mg by mouth daily as needed (frequent urination).   Vitamin D3 50 MCG (2000 UT) Tabs Take 2,000 Units by mouth daily.       Follow-up  [facility], [place]. Schedule an appointment as soon as possible for a visit in 1 week(s).   Specialty:  General Surgery Why:  Rectal Ulcer - Dr Genice Rouge information: [address] [place] [phone]       Your PCP. Schedule an appointment as soon as possible for a visit in 1 week(s).        Otis Brace, MD. Schedule an appointment as soon as possible for a visit in 1 week(s).   Specialty:  Gastroenterology Contact information: [place] [address] [phone]           Major procedures and Radiology Reports - PLEASE review detailed and final reports thoroughly  -        Mr Pelvis W Wo Contrast  Result Date: 07/10/2018 CLINICAL DATA:  History of prostate cancer.  Rectal bleeding. EXAM: MRI PELVIS WITHOUT AND WITH CONTRAST TECHNIQUE: Multiplanar multisequence MR imaging of the pelvis was performed both before and after administration of intravenous contrast. CONTRAST:  9 cc of Gadavist. COMPARISON:  None. FINDINGS: Urinary Tract:  No abnormality visualized. Bowel: No abnormal dilatation of the distal rectum. Along the anterior wall of the rectum posterior to the prostate gland there is large ulceration measuring 2.5 cm with associated ulcer cavity. The ulcer cavity measures 4.3 cm transverse, 3.5 cm cranial caudal and 1.4 cm AP. Marked surrounding inflammation with increased T2 signal and enhancement which extends ventrally to involve the prostate gland and seminal vesicles and laterally to involve the obturator internus and externus muscles bilaterally compatible with myositis. Sinus tracts extend bilaterally to involve bilateral operator internists muscles, image 19/9.  Vascular/Lymphatic: No pathologically enlarged lymph nodes. No significant vascular abnormality seen. Reproductive: There is diffuse increased T1 signal and enhancement throughout the prostate gland and seminal vesicles compatible with inflammation. Small bilateral hydroceles are identified. Other: No free fluid identified within the pelvis. There is a left inguinal hernia which contains fat only, image 30/2. Small Musculoskeletal: No suspicious bone lesions identified. IMPRESSION: 1. There is a large defect involving the ventral wall of the distal rectum posterior to the prostate gland. A large ulcer cavity containing gas, fluid and debris is identified between the ventral wall of the rectum and posterior wall of the prostate gland. Extensive surrounding inflammatory change is identified with myositis of bilateral obturator internus and externus muscles bilaterally. Small sinus tracks extend laterally from this fluid collection to involve bilateral pelvic floor musculature including the operator and peer foremost muscles. There is associated inflammation of the prostate gland and bilateral seminal vesicles. Electronically Signed   By: Kerby Moors M.D.   On: 07/10/2018 14:22   Dg Chest Port 1 View  Result Date: 07/07/2018 CLINICAL DATA:  57 y/o  M; fever. EXAM: PORTABLE CHEST 1 VIEW COMPARISON:  None. FINDINGS: The heart size and mediastinal contours are within normal limits. Both lungs are clear. The visualized skeletal structures are unremarkable. IMPRESSION: No active disease. Electronically Signed   By: Kristine Garbe M.D.   On: 07/07/2018 19:19    Micro Results    Recent Results (from the past 240 hour(s))  Culture, blood (Routine X 2) w Reflex to ID Panel     Status: None (Preliminary result)   Collection Time: 07/07/18  7:12 PM  Result Value Ref Range Status   Specimen Description BLOOD LEFT ANTECUBITAL  Final   Special Requests   Final    BOTTLES DRAWN AEROBIC  AND ANAEROBIC  Blood Culture adequate volume   Culture   Final    NO GROWTH 4 DAYS Performed at [facility] [address], [place], [place] [address]    Report Status PENDING  Incomplete  Culture, blood (Routine X 2) w Reflex to ID Panel     Status: None (Preliminary result)   Collection Time: 07/07/18  7:12 PM  Result Value Ref Range Status   Specimen Description BLOOD RIGHT ANTECUBITAL  Final   Special Requests   Final    BOTTLES DRAWN AEROBIC AND ANAEROBIC Blood Culture adequate volume   Culture   Final    NO GROWTH 4 DAYS Performed at [facility], [place] [address] [place], [place] [address]    Report Status PENDING  Incomplete  Culture, Urine     Status: None   Collection Time: 07/08/18 10:40 PM  Result Value Ref Range Status   Specimen Description URINE, RANDOM  Final   Special Requests NONE  Final   Culture   Final    NO GROWTH Performed at [facility], [place] [address] [place], [place] [address]    Report Status 07/10/2018 FINAL  Final    Today   Subjective    Phillip Blackwell today has no headache,no chest abdominal pain,no new weakness tingling or numbness, feels much better wants to go home today.    Objective   Blood pressure (!) 118/56, pulse 82, temperature 99.2 F (37.3 C), resp. rate 16, height 6' (1.829 m), weight 97 kg, SpO2 98 %.  No intake or output data in the 24 hours ending 07/11/18 1139  Exam Awake Alert, Oriented x 3, No new F.N deficits, Normal affect [place].AT,PERRAL Supple Neck,No JVD, No cervical lymphadenopathy appriciated.  Symmetrical Chest wall movement, Good air movement bilaterally, CTAB RRR,No Gallops,Rubs or new Murmurs, No Parasternal Heave +ve B.Sounds, Abd Soft, Non tender, No organomegaly appriciated, No rebound -guarding or rigidity. No Cyanosis, Clubbing or edema, No new Rash or bruise   Data Review   CBC w Diff:  Lab Results  Component Value Date   WBC 8.8 07/11/2018   HGB 9.8 (L) 07/11/2018   HCT 30.4 (L) 07/11/2018    PLT 267 07/11/2018   LYMPHOPCT 28 08/22/2016   MONOPCT 10 08/22/2016   EOSPCT 2 08/22/2016   BASOPCT 0 08/22/2016    CMP:  Lab Results  Component Value Date   NA 138 07/11/2018   K 3.2 (L) 07/11/2018   CL 102 07/11/2018   CO2 23 07/11/2018   BUN 6 07/11/2018   CREATININE 1.05 07/11/2018   PROT 6.3 (L) 07/08/2018   ALBUMIN 2.6 (L) 07/08/2018   BILITOT 1.0 07/08/2018   ALKPHOS 76 07/08/2018   AST 20 07/08/2018   ALT 14 07/08/2018  .   Total Time in preparing paper work, data evaluation and todays exam - 61 minutes  Lala Lund M.D on 07/11/2018 at 11:39 AM  [facility]   Office  [phone]

## 2018-07-11 NOTE — Progress Notes (Signed)
[name] to be D/C'd Home per MD order.  Discussed with the patient and all questions fully answered.  VSS, Skin clean, dry and intact without evidence of skin break down, no evidence of skin tears noted. IV catheter discontinued intact. Site without signs and symptoms of complications. Dressing and pressure applied.  An After Visit Summary was printed and given to the patient. Patient received prescription.  D/c education completed with patient/family including follow up instructions, medication list, d/c activities limitations if indicated, with other d/c instructions as indicated by MD - patient able to verbalize understanding, all questions fully answered.   Patient instructed to return to ED, call 911, or call MD for any changes in condition.   Patient escorted by staff and D/C home via private auto with wife.  Howard Pouch 07/11/2018 2:35 PM

## 2018-07-11 NOTE — Progress Notes (Signed)
1 Day Post-Op  Subjective: Mr. Fessel was found to have a large rectal ulcer on sigmoidoscopy and the biopsies are pending.   MRI of the pelvis showed a large ulcer cavity over the posterior prostate with inflammation of the prostate and SV's as well as the pelvic floor musculature.   He is remarkably without significant complaints.     ROS:  Review of Systems  Genitourinary:       He has  Minimal LUTS on tamsulosin.   He has no perineal or penile pain.   All other systems reviewed and are negative.   Anti-infectives: Anti-infectives (From admission, onward)   Start     Dose/Rate Route Frequency Ordered Stop   07/07/18 1900  metroNIDAZOLE (FLAGYL) IVPB 500 mg     500 mg 100 mL/hr over 60 Minutes Intravenous Every 8 hours 07/07/18 1739     07/07/18 1830  ciprofloxacin (CIPRO) IVPB 400 mg     400 mg 200 mL/hr over 60 Minutes Intravenous Every 12 hours 07/07/18 1739        Current Facility-Administered Medications  Medication Dose Route Frequency Provider Last Rate Last Dose  . acetaminophen (TYLENOL) tablet 650 mg  650 mg Oral Q6H PRN Caren Griffins, MD      . atorvastatin (LIPITOR) tablet 40 mg  40 mg Oral Daily Caren Griffins, MD   40 mg at 07/09/18 0851  . chlorproMAZINE (THORAZINE) 25 mg in sodium chloride 0.9 % 25 mL IVPB  25 mg Intravenous Q6H PRN Thurnell Lose, MD 50 mL/hr at 07/10/18 2250 25 mg at 07/10/18 2250  . ciprofloxacin (CIPRO) IVPB 400 mg  400 mg Intravenous Q12H Caren Griffins, MD 200 mL/hr at 07/11/18 0551 400 mg at 07/11/18 0551  . clonazePAM (KLONOPIN) tablet 1 mg  1 mg Oral QHS Caren Griffins, MD   1 mg at 07/10/18 2119  . DULoxetine (CYMBALTA) DR capsule 60 mg  60 mg Oral QPM Caren Griffins, MD   60 mg at 07/10/18 1848  . feeding supplement (BOOST / RESOURCE BREEZE) liquid 1 Container  1 Container Oral Q24H Thurnell Lose, MD   1 Container at 07/10/18 1516  . feeding supplement (ENSURE ENLIVE) (ENSURE ENLIVE) liquid 237 mL  237 mL Oral BID BM  Thurnell Lose, MD   237 mL at 07/10/18 2251  . ferrous sulfate tablet 325 mg  325 mg Oral BID WC Thurnell Lose, MD   325 mg at 07/10/18 1848  . LORazepam (ATIVAN) injection 1 mg  1 mg Intravenous Once PRN Thurnell Lose, MD      . metroNIDAZOLE (FLAGYL) IVPB 500 mg  500 mg Intravenous Q8H Caren Griffins, MD 100 mL/hr at 07/11/18 0355 500 mg at 07/11/18 0355  . multivitamin with minerals tablet 1 tablet  1 tablet Oral Daily Thurnell Lose, MD   1 tablet at 07/09/18 1847  . oxyCODONE (Oxy IR/ROXICODONE) immediate release tablet 5 mg  5 mg Oral Q4H PRN Caren Griffins, MD   5 mg at 07/10/18 1517  . polyethylene glycol (MIRALAX / GLYCOLAX) packet 17 g  17 g Oral Daily Thurnell Lose, MD   17 g at 07/09/18 1847  . prazosin (MINIPRESS) capsule 2 mg  2 mg Oral QHS Caren Griffins, MD   2 mg at 07/10/18 2120  . tamsulosin (FLOMAX) capsule 0.4 mg  0.4 mg Oral QHS Thurnell Lose, MD         Objective: Vital  signs in last 24 hours: Temp:  [99.2 F (37.3 C)-99.8 F (37.7 C)] 99.2 F (37.3 C) (12/01 0609) Pulse Rate:  [82-104] 82 (12/01 0609) Resp:  [16-22] 16 (12/01 0609) BP: (118-155)/(56-92) 118/56 (12/01 0609) SpO2:  [98 %-99 %] 98 % (12/01 0609)  Intake/Output from previous day: No intake/output data recorded. Intake/Output this shift: No intake/output data recorded.   Physical Exam  Constitutional: He appears well-developed and well-nourished.  Vitals reviewed.   Lab Results:  Recent Labs    07/10/18 0355 07/11/18 0508  WBC 10.7* 8.8  HGB 10.0* 9.8*  HCT 32.2* 30.4*  PLT 247 267   BMET Recent Labs    07/10/18 0355 07/11/18 0508  NA 135 138  K 3.6 3.2*  CL 102 102  CO2 22 23  GLUCOSE 100* 99  BUN 5* 6  CREATININE 1.31* 1.05  CALCIUM 8.8* 8.6*   PT/INR No results for input(s): LABPROT, INR in the last 72 hours. ABG No results for input(s): PHART, HCO3 in the last 72 hours.  Invalid input(s): PCO2, PO2  Studies/Results: Mr Pelvis W Wo  Contrast  Result Date: 07/10/2018 CLINICAL DATA:  History of prostate cancer.  Rectal bleeding. EXAM: MRI PELVIS WITHOUT AND WITH CONTRAST TECHNIQUE: Multiplanar multisequence MR imaging of the pelvis was performed both before and after administration of intravenous contrast. CONTRAST:  9 cc of Gadavist. COMPARISON:  None. FINDINGS: Urinary Tract:  No abnormality visualized. Bowel: No abnormal dilatation of the distal rectum. Along the anterior wall of the rectum posterior to the prostate gland there is large ulceration measuring 2.5 cm with associated ulcer cavity. The ulcer cavity measures 4.3 cm transverse, 3.5 cm cranial caudal and 1.4 cm AP. Marked surrounding inflammation with increased T2 signal and enhancement which extends ventrally to involve the prostate gland and seminal vesicles and laterally to involve the obturator internus and externus muscles bilaterally compatible with myositis. Sinus tracts extend bilaterally to involve bilateral operator internists muscles, image 19/9. Vascular/Lymphatic: No pathologically enlarged lymph nodes. No significant vascular abnormality seen. Reproductive: There is diffuse increased T1 signal and enhancement throughout the prostate gland and seminal vesicles compatible with inflammation. Small bilateral hydroceles are identified. Other: No free fluid identified within the pelvis. There is a left inguinal hernia which contains fat only, image 30/2. Small Musculoskeletal: No suspicious bone lesions identified. IMPRESSION: 1. There is a large defect involving the ventral wall of the distal rectum posterior to the prostate gland. A large ulcer cavity containing gas, fluid and debris is identified between the ventral wall of the rectum and posterior wall of the prostate gland. Extensive surrounding inflammatory change is identified with myositis of bilateral obturator internus and externus muscles bilaterally. Small sinus tracks extend laterally from this fluid collection  to involve bilateral pelvic floor musculature including the operator and peer foremost muscles. There is associated inflammation of the prostate gland and bilateral seminal vesicles. Electronically Signed   By: Kerby Moors M.D.   On: 07/10/2018 14:22  MRI films and report reviewed.   Labs reviewed.  Images from sigmoidoscopy reviewed.  Path is pending.    Assessment and Plan: Rectal ulcer with associated inflammatory changes of the prostate and seminal vesicles as well as the pelvic floor musculature.   The biopsy is pending but the most likely cause of the ulcer is radiation and not neoplasm.  At this point he doesn't appear to have a rectourethral fistula but with involvement of the prostate in the inflammatory process I would be very concerned that  he could progress to a fistula.  I my experience, this process is unlikely to heal without surgical  intervention.  Awaiting Dr. Orest Dikes recommendation.       LOS: 4 days    Irine Seal 07/11/2018 [phone] ID: [name], male   DOB: 1961/04/16, 57 y.o.   MRN: [id number]

## 2018-07-11 NOTE — Discharge Instructions (Signed)
Follow with Primary MD System, Pcp Not In in 7 days   Get CBC, CMP, 2 view Chest X ray -  checked  by Primary MD  in 5-7 days    Activity: As tolerated with Full fall precautions use walker/cane & assistance as needed  Disposition Home   Diet: Heart Healthy    Special Instructions: If you have smoked or chewed Tobacco  in the last 2 yrs please stop smoking, stop any regular Alcohol  and or any Recreational drug use.  On your next visit with your primary care physician please Get Medicines reviewed and adjusted.  Please request your Prim.MD to go over all Hospital Tests and Procedure/Radiological results at the follow up, please get all Hospital records sent to your Prim MD by signing hospital release before you go home.  If you experience worsening of your admission symptoms, develop shortness of breath, life threatening emergency, suicidal or homicidal thoughts you must seek medical attention immediately by calling 911 or calling your MD immediately  if symptoms less severe.  You Must read complete instructions/literature along with all the possible adverse reactions/side effects for all the Medicines you take and that have been prescribed to you. Take any new Medicines after you have completely understood and accpet all the possible adverse reactions/side effects.

## 2018-07-12 LAB — CULTURE, BLOOD (ROUTINE X 2)
Culture: NO GROWTH
Culture: NO GROWTH
SPECIAL REQUESTS: ADEQUATE
Special Requests: ADEQUATE

## 2018-08-13 ENCOUNTER — Encounter (HOSPITAL_COMMUNITY): Payer: Self-pay | Admitting: Emergency Medicine

## 2018-08-13 ENCOUNTER — Emergency Department (HOSPITAL_COMMUNITY): Payer: No Typology Code available for payment source

## 2018-08-13 ENCOUNTER — Inpatient Hospital Stay (HOSPITAL_COMMUNITY)
Admission: EM | Admit: 2018-08-13 | Discharge: 2018-09-03 | DRG: 853 | Disposition: A | Discharge status: Home Health | Payer: No Typology Code available for payment source | Attending: Internal Medicine | Admitting: Internal Medicine

## 2018-08-13 ENCOUNTER — Other Ambulatory Visit: Payer: Self-pay

## 2018-08-13 DIAGNOSIS — F419 Anxiety disorder, unspecified: Secondary | ICD-10-CM | POA: Diagnosis present

## 2018-08-13 DIAGNOSIS — A419 Sepsis, unspecified organism: Secondary | ICD-10-CM | POA: Diagnosis not present

## 2018-08-13 DIAGNOSIS — R17 Unspecified jaundice: Secondary | ICD-10-CM

## 2018-08-13 DIAGNOSIS — G4733 Obstructive sleep apnea (adult) (pediatric): Secondary | ICD-10-CM | POA: Diagnosis present

## 2018-08-13 DIAGNOSIS — D62 Acute posthemorrhagic anemia: Secondary | ICD-10-CM | POA: Diagnosis present

## 2018-08-13 DIAGNOSIS — Z7982 Long term (current) use of aspirin: Secondary | ICD-10-CM

## 2018-08-13 DIAGNOSIS — F329 Major depressive disorder, single episode, unspecified: Secondary | ICD-10-CM | POA: Diagnosis present

## 2018-08-13 DIAGNOSIS — K529 Noninfective gastroenteritis and colitis, unspecified: Secondary | ICD-10-CM | POA: Diagnosis present

## 2018-08-13 DIAGNOSIS — K626 Ulcer of anus and rectum: Secondary | ICD-10-CM | POA: Diagnosis present

## 2018-08-13 DIAGNOSIS — E785 Hyperlipidemia, unspecified: Secondary | ICD-10-CM | POA: Diagnosis present

## 2018-08-13 DIAGNOSIS — Z8673 Personal history of transient ischemic attack (TIA), and cerebral infarction without residual deficits: Secondary | ICD-10-CM

## 2018-08-13 DIAGNOSIS — E871 Hypo-osmolality and hyponatremia: Secondary | ICD-10-CM | POA: Diagnosis present

## 2018-08-13 DIAGNOSIS — K921 Melena: Secondary | ICD-10-CM | POA: Diagnosis present

## 2018-08-13 DIAGNOSIS — R5381 Other malaise: Secondary | ICD-10-CM | POA: Diagnosis present

## 2018-08-13 DIAGNOSIS — R6520 Severe sepsis without septic shock: Secondary | ICD-10-CM

## 2018-08-13 DIAGNOSIS — K219 Gastro-esophageal reflux disease without esophagitis: Secondary | ICD-10-CM | POA: Diagnosis present

## 2018-08-13 DIAGNOSIS — N179 Acute kidney failure, unspecified: Secondary | ICD-10-CM

## 2018-08-13 DIAGNOSIS — N419 Inflammatory disease of prostate, unspecified: Secondary | ICD-10-CM | POA: Diagnosis present

## 2018-08-13 DIAGNOSIS — E440 Moderate protein-calorie malnutrition: Secondary | ICD-10-CM | POA: Diagnosis present

## 2018-08-13 DIAGNOSIS — N451 Epididymitis: Secondary | ICD-10-CM

## 2018-08-13 DIAGNOSIS — C61 Malignant neoplasm of prostate: Secondary | ICD-10-CM | POA: Diagnosis present

## 2018-08-13 DIAGNOSIS — E860 Dehydration: Secondary | ICD-10-CM | POA: Diagnosis present

## 2018-08-13 DIAGNOSIS — Z6825 Body mass index (BMI) 25.0-25.9, adult: Secondary | ICD-10-CM

## 2018-08-13 DIAGNOSIS — N5089 Other specified disorders of the male genital organs: Secondary | ICD-10-CM | POA: Diagnosis present

## 2018-08-13 DIAGNOSIS — K631 Perforation of intestine (nontraumatic): Secondary | ICD-10-CM | POA: Diagnosis present

## 2018-08-13 DIAGNOSIS — R6510 Systemic inflammatory response syndrome (SIRS) of non-infectious origin without acute organ dysfunction: Secondary | ICD-10-CM | POA: Diagnosis present

## 2018-08-13 DIAGNOSIS — D638 Anemia in other chronic diseases classified elsewhere: Secondary | ICD-10-CM | POA: Diagnosis present

## 2018-08-13 DIAGNOSIS — N170 Acute kidney failure with tubular necrosis: Secondary | ICD-10-CM | POA: Diagnosis present

## 2018-08-13 DIAGNOSIS — I10 Essential (primary) hypertension: Secondary | ICD-10-CM | POA: Diagnosis present

## 2018-08-13 DIAGNOSIS — E872 Acidosis, unspecified: Secondary | ICD-10-CM

## 2018-08-13 DIAGNOSIS — R066 Hiccough: Secondary | ICD-10-CM | POA: Diagnosis present

## 2018-08-13 DIAGNOSIS — Z79899 Other long term (current) drug therapy: Secondary | ICD-10-CM

## 2018-08-13 DIAGNOSIS — Z923 Personal history of irradiation: Secondary | ICD-10-CM

## 2018-08-13 DIAGNOSIS — R1030 Lower abdominal pain, unspecified: Secondary | ICD-10-CM | POA: Diagnosis not present

## 2018-08-13 DIAGNOSIS — N400 Enlarged prostate without lower urinary tract symptoms: Secondary | ICD-10-CM | POA: Diagnosis present

## 2018-08-13 DIAGNOSIS — R6521 Severe sepsis with septic shock: Secondary | ICD-10-CM | POA: Diagnosis present

## 2018-08-13 LAB — CBC WITH DIFFERENTIAL/PLATELET
Abs Immature Granulocytes: 0.9 10*3/uL — ABNORMAL HIGH (ref 0.00–0.07)
Basophils Absolute: 0.1 10*3/uL (ref 0.0–0.1)
Basophils Relative: 0 %
Eosinophils Absolute: 0.1 10*3/uL (ref 0.0–0.5)
Eosinophils Relative: 0 %
HCT: 26.9 % — ABNORMAL LOW (ref 39.0–52.0)
Hemoglobin: 8.4 g/dL — ABNORMAL LOW (ref 13.0–17.0)
Immature Granulocytes: 2 %
Lymphocytes Relative: 3 %
Lymphs Abs: 1.2 10*3/uL (ref 0.7–4.0)
MCH: 29.2 pg (ref 26.0–34.0)
MCHC: 31.2 g/dL (ref 30.0–36.0)
MCV: 93.4 fL (ref 80.0–100.0)
MONO ABS: 2.1 10*3/uL — AB (ref 0.1–1.0)
Monocytes Relative: 5 %
Neutro Abs: 37.4 10*3/uL — ABNORMAL HIGH (ref 1.7–7.7)
Neutrophils Relative %: 90 %
Platelets: 284 10*3/uL (ref 150–400)
RBC: 2.88 MIL/uL — AB (ref 4.22–5.81)
RDW: 16 % — AB (ref 11.5–15.5)
WBC: 41.7 10*3/uL — ABNORMAL HIGH (ref 4.0–10.5)
nRBC: 0 % (ref 0.0–0.2)

## 2018-08-13 LAB — COMPREHENSIVE METABOLIC PANEL
ALK PHOS: 118 U/L (ref 38–126)
ALT: 41 U/L (ref 0–44)
ANION GAP: 15 (ref 5–15)
AST: 50 U/L — ABNORMAL HIGH (ref 15–41)
Albumin: 2 g/dL — ABNORMAL LOW (ref 3.5–5.0)
BUN: 92 mg/dL — ABNORMAL HIGH (ref 6–20)
CALCIUM: 9 mg/dL (ref 8.9–10.3)
CO2: 17 mmol/L — ABNORMAL LOW (ref 22–32)
Chloride: 98 mmol/L (ref 98–111)
Creatinine, Ser: 7.23 mg/dL — ABNORMAL HIGH (ref 0.61–1.24)
GFR calc Af Amer: 9 mL/min — ABNORMAL LOW (ref >60)
GFR calc non Af Amer: 8 mL/min — ABNORMAL LOW (ref >60)
Glucose, Bld: 157 mg/dL — ABNORMAL HIGH (ref 70–99)
Potassium: 5 mmol/L (ref 3.5–5.1)
Sodium: 130 mmol/L — ABNORMAL LOW (ref 135–145)
Total Bilirubin: 6.4 mg/dL — ABNORMAL HIGH (ref 0.3–1.2)
Total Protein: 7.8 g/dL (ref 6.5–8.1)

## 2018-08-13 LAB — POC OCCULT BLOOD, ED: Fecal Occult Bld: POSITIVE — AB

## 2018-08-13 LAB — URINALYSIS, ROUTINE W REFLEX MICROSCOPIC
Bilirubin Urine: NEGATIVE
Glucose, UA: NEGATIVE mg/dL
Hgb urine dipstick: NEGATIVE
Ketones, ur: NEGATIVE mg/dL
Leukocytes, UA: NEGATIVE
Nitrite: NEGATIVE
Protein, ur: 30 mg/dL — AB
Specific Gravity, Urine: 1.014 (ref 1.005–1.030)
pH: 5 (ref 5.0–8.0)

## 2018-08-13 LAB — I-STAT CG4 LACTIC ACID, ED
Lactic Acid, Venous: 3.8 mmol/L (ref 0.5–1.9)
Lactic Acid, Venous: 1.95 mmol/L — ABNORMAL HIGH (ref 0.5–1.9)

## 2018-08-13 LAB — CBG MONITORING, ED: Glucose-Capillary: 156 mg/dL — ABNORMAL HIGH (ref 70–99)

## 2018-08-13 LAB — PROTIME-INR
INR: 1.18
Prothrombin Time: 14.9 seconds (ref 11.4–15.2)

## 2018-08-13 LAB — LIPASE, BLOOD: Lipase: 33 U/L (ref 11–51)

## 2018-08-13 MED ORDER — SODIUM CHLORIDE 0.9 % IV BOLUS
1000.0000 mL | Freq: Once | INTRAVENOUS | Status: AC
  Administered 2018-08-13: 1000 mL via INTRAVENOUS

## 2018-08-13 MED ORDER — SODIUM CHLORIDE 0.9 % IV BOLUS (SEPSIS)
1000.0000 mL | Freq: Once | INTRAVENOUS | Status: AC
  Administered 2018-08-14: 1000 mL via INTRAVENOUS

## 2018-08-13 MED ORDER — METRONIDAZOLE IN NACL 5-0.79 MG/ML-% IV SOLN
500.0000 mg | Freq: Three times a day (TID) | INTRAVENOUS | Status: DC
  Administered 2018-08-13 – 2018-08-17 (×11): 500 mg via INTRAVENOUS
  Filled 2018-08-13 (×12): qty 100

## 2018-08-13 MED ORDER — SODIUM CHLORIDE 0.9 % IV BOLUS (SEPSIS)
1000.0000 mL | Freq: Once | INTRAVENOUS | Status: AC
  Administered 2018-08-13: 1000 mL via INTRAVENOUS

## 2018-08-13 MED ORDER — ACETAMINOPHEN 325 MG PO TABS
650.0000 mg | ORAL_TABLET | Freq: Once | ORAL | Status: AC
  Administered 2018-08-13: 650 mg via ORAL
  Filled 2018-08-13: qty 2

## 2018-08-13 MED ORDER — VANCOMYCIN VARIABLE DOSE PER UNSTABLE RENAL FUNCTION (PHARMACIST DOSING): Status: DC

## 2018-08-13 MED ORDER — SODIUM CHLORIDE 0.9 % IV SOLN
1.0000 g | INTRAVENOUS | Status: DC
  Administered 2018-08-15 (×2): 1 g via INTRAVENOUS
  Filled 2018-08-13 (×4): qty 1

## 2018-08-13 MED ORDER — SODIUM CHLORIDE 0.9 % IV SOLN
2.0000 g | Freq: Once | INTRAVENOUS | Status: AC
  Administered 2018-08-13: 2 g via INTRAVENOUS
  Filled 2018-08-13: qty 2

## 2018-08-13 MED ORDER — VANCOMYCIN HCL IN DEXTROSE 1-5 GM/200ML-% IV SOLN
1000.0000 mg | Freq: Once | INTRAVENOUS | Status: AC
  Administered 2018-08-13: 1000 mg via INTRAVENOUS
  Filled 2018-08-13: qty 200

## 2018-08-13 NOTE — ED Triage Notes (Addendum)
Poor historian.  Reports groin swelling and pain x 2 days with difficulty urinating.  Upon questioning pt more also has generalized weakness, nausea, vomiting, and diarrhea x 5 days.  Denies abd pain.  Questioned if pt had new confusion and wife states he does.  When RN leaving room pt now reports dark rectal bleeding for "months".

## 2018-08-13 NOTE — ED Notes (Signed)
Patient transported to CT 

## 2018-08-13 NOTE — Progress Notes (Signed)
Pharmacy Antibiotic Note  Phillip Blackwell is a 58 y.o. male admitted on 08/13/2018 with sepsis.  Pharmacy has been consulted for Vancomycin and Cefepime dosing.  Plan: Vancomycin 1 gram IV x 1 given in ED, pharmacy will assess daily for further dosing based on renal dysfunction. Cefepime 2 grams IV x 1, followed by 1 gram IV q24hr Will monitor renal function, C&S, and vancomycin levels as appropriate.  Height: 6' (182.9 cm) Weight: 190 lb (86.2 kg) IBW/kg (Calculated) : 77.6  Temp (24hrs), Avg:99.2 F (37.3 C), Min:99.2 F (37.3 C), Max:99.2 F (37.3 C)  Recent Labs  Lab 08/13/18 2000 08/13/18 2023  WBC 41.7*  --   CREATININE 7.23*  --   LATICACIDVEN  --  3.80*    Estimated Creatinine Clearance: 12.1 mL/min (A) (by C-G formula based on SCr of 7.23 mg/dL (H)).    No Known Allergies  Antimicrobials this admission: Vanc 01/03 >>  Cefepime 01/03 >>   Thank you for allowing pharmacy to be a part of this patient's care.  Alanda Slim, PharmD, [facility] Clinical Pharmacist Please see AMION for all Pharmacists' Contact Phone Numbers 08/13/2018, 9:01 PM

## 2018-08-13 NOTE — ED Provider Notes (Signed)
[facility] EMERGENCY DEPARTMENT Provider Note   CSN: [id number] Arrival date & time: 08/13/18  1735     History   Chief Complaint Chief Complaint  Patient presents with  . Groin Swelling  . Weakness  . Vomiting  . Diarrhea    HPI Phillip Blackwell is a 58 y.o. male with history of anxiety, depression, GERD, hypertension, rectal bleeding, CVA presents for evaluation of acute onset, progressively worsening groin swelling for 3 days.  He reports dysuria, dark urine, testicular pain, penile and scrotal swelling.  He endorses watery bloody diarrhea for the last few days but notes that he has had blood in his stools for several months and has been seen by [facility] gastroenterology for this.  He underwent flexible sigmoidoscopy in November of last year with evidence of a single deep ulcer in the distal rectum.  He has had 3-4 episodes of nonbloody nonbilious emesis daily.  Notes subjective fevers and chills but has not taken his temperature.  Denies chest pain or shortness of breath.  Has not tried anything for his symptoms.  The history is provided by the spouse and the patient.    Past Medical History:  Diagnosis Date  . Anxiety   . Depression   . GERD (gastroesophageal reflux disease)   . Hypertension   . Rectal bleeding 07/07/2018  . Sleep apnea   . Stroke [facility])    2014    Patient Active Problem List   Diagnosis Date Noted  . Sepsis ([place]) 08/14/2018  . Lower GI bleed 07/07/2018  . HTN (hypertension) 07/07/2018  . HLD (hyperlipidemia) 07/07/2018  . Prostate cancer ([place]) 07/07/2018    Past Surgical History:  Procedure Laterality Date  . BIOPSY  07/10/2018   Procedure: BIOPSY;  Surgeon: Otis Brace, MD;  Location: [place];  Service: Gastroenterology;;  . Otho Darner SIGMOIDOSCOPY N/A 07/10/2018   Procedure: FLEXIBLE SIGMOIDOSCOPY;  Surgeon: Otis Brace, MD;  Location: [place];  Service: Gastroenterology;  Laterality: N/A;  Adult EGD Scope.  Peds biopsy forceps.  Marland Kitchen HERNIA REPAIR          Home Medications    Prior to Admission medications   Medication Sig Start Date End Date Taking? Authorizing Provider  amLODipine (NORVASC) 10 MG tablet Take 10 mg by mouth daily.    [provider]  aspirin 81 MG chewable tablet Chew 81 mg by mouth daily.    [provider]  atorvastatin (LIPITOR) 40 MG tablet Take 40 mg by mouth daily with supper.     [provider]  chlorproMAZINE (THORAZINE) 25 MG tablet Take 25 mg by mouth at bedtime.    [provider]  Cholecalciferol (VITAMIN D3) 2000 units TABS Take 2,000 Units by mouth daily.     [provider]  clonazePAM (KLONOPIN) 1 MG tablet Take 1 mg by mouth at bedtime.     [provider]  DULoxetine (CYMBALTA) 60 MG capsule Take 60 mg by mouth daily with supper.     [provider]  ferrous sulfate 325 (65 FE) MG tablet Take 1 tablet (325 mg total) by mouth daily with breakfast. 07/11/18   Thurnell Lose, MD  LACTOBACILLUS PO Take 1 tablet by mouth daily.    [provider]  lisinopril (PRINIVIL,ZESTRIL) 40 MG tablet Take 40 mg by mouth daily.    [provider]  metoprolol tartrate (LOPRESSOR) 100 MG tablet Take 100 mg by mouth 2 (two) times daily.     [provider]  Multiple Vitamins-Minerals (MULTIVITAMIN WITH MINERALS) tablet Take 1 tablet by mouth daily.    [provider]  omeprazole (PRILOSEC) 40 MG capsule Take 40 mg by mouth daily.     [provider]  prazosin (MINIPRESS) 1 MG capsule Take 2 mg by mouth at bedtime.     [provider]  tamsulosin (FLOMAX) 0.4 MG CAPS capsule Take 0.4 mg by mouth daily as needed (frequent urination).    [provider]    Family History No family history on file.  Social History Social History   Tobacco Use  . Smoking status: Never Smoker  . Smokeless tobacco: Never Used  Substance Use Topics  . Alcohol use: No   . Drug use: No     Allergies   Patient has no known allergies.   Review of Systems Review of Systems  Constitutional: Positive for chills and fever.  Respiratory: Negative for shortness of breath.   Cardiovascular: Negative for chest pain.  Gastrointestinal: Positive for abdominal pain, blood in stool, diarrhea, nausea and vomiting.  Genitourinary: Positive for dysuria, penile pain, penile swelling, scrotal swelling and testicular pain. Negative for discharge.  All other systems reviewed and are negative.    Physical Exam Updated Vital Signs BP 118/65   Pulse 99   Temp (S) (!) 101.7 F (38.7 C) (Rectal)   Resp (!) 23   Ht 6' (1.829 m)   Wt 86.2 kg   SpO2 95%   BMI 25.77 kg/m   Physical Exam Vitals signs and nursing note reviewed. Exam conducted with a chaperone present.  Constitutional:      General: He is not in acute distress.    Appearance: He is well-developed.  HENT:     Head: Normocephalic and atraumatic.  Eyes:     General: Scleral icterus present.        Right eye: No discharge.        Left eye: No discharge.     Conjunctiva/sclera: Conjunctivae normal.  Neck:     Vascular: No JVD.     Trachea: No tracheal deviation.  Cardiovascular:     Rate and Rhythm: Tachycardia present.     Pulses: Normal pulses.     Heart sounds: Normal heart sounds.  Pulmonary:     Effort: Pulmonary effort is normal.     Breath sounds: Normal breath sounds.  Abdominal:     General: There is no distension.     Tenderness: There is abdominal tenderness in the left lower quadrant. There is no right CVA tenderness, left CVA tenderness, guarding or rebound.  Genitourinary:    Penis: Tenderness and swelling present. No erythema or discharge.      Scrotum/Testes:        Right: Tenderness and swelling present.        Left: Tenderness and swelling present.     Comments: Swelling and tenderness to the mons pubis noted. No erythema or induration.  No crepitus. Testicular tenderness  L>R, mild scrotal swelling.  Skin:    General: Skin is warm and dry.     Findings: No erythema.  Neurological:     Mental Status: He is alert.  Psychiatric:        Behavior: Behavior normal.      ED Treatments / Results  Labs (all labs ordered are listed, but only abnormal results are displayed) Labs Reviewed  COMPREHENSIVE METABOLIC PANEL - Abnormal; Notable for the following components:      Result Value   Sodium 130 (*)  CO2 17 (*)    Glucose, Bld 157 (*)    BUN 92 (*)    Creatinine, Ser 7.23 (*)    Albumin 2.0 (*)    AST 50 (*)    Total Bilirubin 6.4 (*)    GFR calc non Af Amer 8 (*)    GFR calc Af Amer 9 (*)    All other components within normal limits  CBC WITH DIFFERENTIAL/PLATELET - Abnormal; Notable for the following components:   WBC 41.7 (*)    RBC 2.88 (*)    Hemoglobin 8.4 (*)    HCT 26.9 (*)    RDW 16.0 (*)    Neutro Abs 37.4 (*)    Monocytes Absolute 2.1 (*)    Abs Immature Granulocytes 0.90 (*)    All other components within normal limits  URINALYSIS, ROUTINE W REFLEX MICROSCOPIC - Abnormal; Notable for the following components:   Color, Urine AMBER (*)    APPearance CLOUDY (*)    Protein, ur 30 (*)    Bacteria, UA RARE (*)    All other components within normal limits  I-STAT CG4 LACTIC ACID, ED - Abnormal; Notable for the following components:   Lactic Acid, Venous 3.80 (*)    All other components within normal limits  POC OCCULT BLOOD, ED - Abnormal; Notable for the following components:   Fecal Occult Bld POSITIVE (*)    All other components within normal limits  CBG MONITORING, ED - Abnormal; Notable for the following components:   Glucose-Capillary 156 (*)    All other components within normal limits  I-STAT CG4 LACTIC ACID, ED - Abnormal; Notable for the following components:   Lactic Acid, Venous 1.95 (*)    All other components within normal limits  CULTURE, BLOOD (ROUTINE X 2)  CULTURE, BLOOD (ROUTINE X 2)  URINE CULTURE    PROTIME-INR  LIPASE, BLOOD    EKG None  Radiology Ct Abdomen Pelvis Wo Contrast  Result Date: 08/13/2018 CLINICAL DATA:  Groin swelling EXAM: CT ABDOMEN AND PELVIS WITHOUT CONTRAST TECHNIQUE: Multidetector CT imaging of the abdomen and pelvis was performed following the standard protocol without IV contrast. COMPARISON:  MRI 07/10/2018 FINDINGS: Lower chest: Lung bases demonstrate no acute consolidation or effusion. The heart size is normal. Small hiatal hernia. Hepatobiliary: No focal liver abnormality is seen. No gallstones, gallbladder wall thickening, or biliary dilatation. Pancreas: Unremarkable. No pancreatic ductal dilatation or surrounding inflammatory changes. Spleen: Normal in size without focal abnormality. Adrenals/Urinary Tract: Adrenal glands are within normal limits. No hydronephrosis. Thick-walled urinary bladder Stomach/Bowel: Stomach is nonenlarged. No dilated small bowel. Extensive gas collection Between the rectum and prostate. Vascular/Lymphatic: Mild aortic atherosclerosis. No aneurysm. No significantly enlarged lymph nodes. Reproductive: Metallic densities along the posterior aspect of the prostate. Large gas collections around the prostate gland. Moderate edema of the scrotum and around the penis. Other: No free air. Fat in the left inguinal canal. Edema within the pubic soft tissues. Gas and fluid collection extending from the rectal area and along both sides of the prostate gland. Musculoskeletal: No acute or suspicious abnormality. IMPRESSION: 1. Moderate edema and mild inflammatory changes involving the scrotum and soft tissues about the penis, but without soft tissue emphysema. Mild edema within the pubic soft tissues. 2. Abnormal gas and fluid collection between the rectum and prostate gland and extending along the right and left aspects of the prostate gland, felt to correspond to previously demonstrated rectal ulcer and sinus tracks. 3. Thick-walled appearance of the  urinary bladder,  possible cystitis Electronically Signed   By: Donavan Foil M.D.   On: 08/13/2018 23:19   Dg Chest 2 View  Result Date: 08/13/2018 CLINICAL DATA:  Acute onset of genital swelling. Fever. EXAM: CHEST - 2 VIEW COMPARISON:  Chest radiograph performed 07/07/2018 FINDINGS: The lungs are well-aerated. Minimal left basilar atelectasis is noted. There is no evidence of pleural effusion or pneumothorax. The heart is normal in size; the mediastinal contour is within normal limits. No acute osseous abnormalities are seen. IMPRESSION: Minimal left basilar atelectasis noted. Lungs otherwise clear. Electronically Signed   By: Garald Balding M.D.   On: 08/13/2018 22:16   US Scrotum W/doppler  Result Date: 08/13/2018 CLINICAL DATA:  Initial evaluation for acute testicular pain, swelling. EXAM: SCROTAL ULTRASOUND DOPPLER ULTRASOUND OF THE TESTICLES TECHNIQUE: Complete ultrasound examination of the testicles, epididymis, and other scrotal structures was performed. Color and spectral Doppler ultrasound were also utilized to evaluate blood flow to the testicles. COMPARISON:  None. FINDINGS: Right testicle Measurements: 3.5 x 2.5 x 1.9 cm. No mass lesion. Testicular microlithiasis noted. Left testicle Measurements: 3.9 x 2.2 x 2.3 cm. No mass lesion. Testicular microlithiasis noted. Right epididymis:  Normal in size and appearance. Left epididymis: Diffusely increased vascularity seen within the left epididymis, suggesting acute epididymitis. Hydrocele:  Bilateral hydroceles noted. Varicocele:  Bilateral varicoceles noted. Pulsed Doppler interrogation of both testes demonstrates normal low resistance arterial and venous waveforms bilaterally. Incidental note made of a probable small fat containing right inguinal hernia. IMPRESSION: 1. Increased vascularity within the left epididymis, suggesting acute epididymitis. No evidence for testicular torsion. 2. Small moderate bilateral hydroceles, likely reactive. 3. Small  bilateral varicoceles. 4. Probable small fat containing right inguinal hernia. 5. Testicular microlithiasis. Current literature suggests that testicular microlithiasis is not a significant independent risk factor for development of testicular carcinoma, and that follow up imaging is not warranted in the absence of other risk factors. Monthly testicular self-examination and annual physical exams are considered appropriate surveillance. If patient has other risk factors for testicular carcinoma, then referral to Urology should be considered. (Reference: DeCastro, et al.: A 5-Year Follow up Study of Asymptomatic Men with Testicular Microlithiasis. J Urol 2008; [id number].) Electronically Signed   By: Jeannine Boga M.D.   On: 08/13/2018 22:48    Procedures .Critical Care Performed by: Renita Papa, PA-C Authorized by: Renita Papa, PA-C   Critical care provider statement:    Critical care time (minutes):  45   Critical care was necessary to treat or prevent imminent or life-threatening deterioration of the following conditions:  Sepsis   Critical care was time spent personally by me on the following activities:  Discussions with consultants, evaluation of patient's response to treatment, examination of patient, ordering and performing treatments and interventions, ordering and review of laboratory studies, ordering and review of radiographic studies, pulse oximetry, re-evaluation of patient's condition, obtaining history from patient or surrogate and review of old charts   I assumed direction of critical care for this patient from another provider in my specialty: no     (including critical care time)  Medications Ordered in ED Medications  sodium chloride 0.9 % bolus 1,000 mL (1,000 mLs Intravenous New Bag/Given 08/13/18 2134)    And  sodium chloride 0.9 % bolus 1,000 mL (has no administration in time range)    And  sodium chloride 0.9 % bolus 1,000 mL (has no administration in time  range)  metroNIDAZOLE (FLAGYL) IVPB 500 mg (500 mg Intravenous New Bag/Given 08/13/18 2132)  ceFEPIme (MAXIPIME) 1 g in sodium chloride 0.9 % 100 mL IVPB (has no administration in time range)  vancomycin variable dose per unstable renal function (pharmacist dosing) (has no administration in time range)  sodium chloride 0.9 % bolus 1,000 mL (1,000 mLs Intravenous New Bag/Given 08/13/18 2053)  ceFEPIme (MAXIPIME) 2 g in sodium chloride 0.9 % 100 mL IVPB (0 g Intravenous Stopped 08/13/18 2141)  vancomycin (VANCOCIN) IVPB 1000 mg/200 mL premix (1,000 mg Intravenous New Bag/Given 08/13/18 2233)  acetaminophen (TYLENOL) tablet 650 mg (650 mg Oral Given 08/13/18 2309)     Initial Impression / Assessment and Plan / ED Course  I have reviewed the triage vital signs and the nursing notes.  Pertinent labs & imaging results that were available during my care of the patient were reviewed by me and considered in my medical decision making (see chart for details).     Patient presenting for evaluation of groin/penile swelling for 3 days.  A rectal temp of 101.7 F and patient is tachycardic and borderline hypotensive on initial presentation.  He appears unwell.  Lab work reviewed by me significant for elevated leukocytosis of 41.7, acutely elevated creatinine of 7.23 and BUN 92.  Lactate initially elevated at 3.0.  He also has an elevated total bilirubin of 6.4 which also appears to be acute.  Code sepsis was initiated and he was given broad-spectrum antibiotics and 30 cc/kg bolus; lactate improved on reevaluation.  Due to acute renal failure, patient unable to undergo contrasted CT scan.  His UA does not suggest UTI however will culture.  Scrotal ultrasound shows evidence of possible left epididymitis, no evidence of testicular torsion.  CT of the abdomen and pelvis shows edema and inflammatory changes involving the scrotum and soft tissues about the pain some but without evidence of soft tissue emphysema.  No evidence  of Fournier's gangrene.  He does have an abnormal gas and fluid collection between the rectum and prostate gland extending along the right and left aspects of the prostate gland which is felt to correspond to previously demonstrated rectal ulcer and sinus tracts.  Review of MRI of the pelvis obtained 07/10/2018 shows similar findings.  Spoke with Dr. Redmond Pulling with general surgery regarding patient's CT scan findings and encouraged review of images; he is aware of the patient and states that the hospitalist team can reach out to general surgery if they feel further surgical consultation is warranted.  Spoke with Dr. Marlowe Sax with [facility] hospitalist service who agrees to assume care of patient and bring him into the hospital for further evaluation and management.  She is aware of patient's imaging and recommendation to reach out to general surgery for further recommendations if warranted.  Patient seen and evaluate Dr. Ronnald Nian who agrees with assessment and plan at this time.  Critical care services provided during this encounter.  Sepsis - Repeat Assessment  Performed at:    2345  Vitals     Blood pressure 118/65, pulse 99, temperature (S) (!) 101.7 F (38.7 C), temperature source (S) Rectal, resp. rate (!) 23, height 6' (1.829 m), weight 86.2 kg, SpO2 95 %.  Heart:     Regular rate and rhythm  Lungs:    CTA  Capillary Refill:   <2 sec  Peripheral Pulse:   Radial pulse palpable, Dorsalis pedis pulse  palpable and Posterior tibialis pulse  palpable  Skin:     Normal Color     Final Clinical Impressions(s) / ED Diagnoses   Final diagnoses:  Sepsis  with acute renal failure and septic shock, due to unspecified organism, unspecified acute renal failure type [facility])  Epididymitis    ED Discharge Orders    None       Renita Papa, PA-C 08/14/18 0018    Lennice Sites, DO 08/14/18 0031

## 2018-08-13 NOTE — ED Notes (Signed)
Patient transported to X-ray 

## 2018-08-14 ENCOUNTER — Inpatient Hospital Stay (HOSPITAL_COMMUNITY): Payer: No Typology Code available for payment source

## 2018-08-14 DIAGNOSIS — E872 Acidosis, unspecified: Secondary | ICD-10-CM

## 2018-08-14 DIAGNOSIS — Z79899 Other long term (current) drug therapy: Secondary | ICD-10-CM | POA: Diagnosis not present

## 2018-08-14 DIAGNOSIS — Z923 Personal history of irradiation: Secondary | ICD-10-CM | POA: Diagnosis not present

## 2018-08-14 DIAGNOSIS — R6520 Severe sepsis without septic shock: Secondary | ICD-10-CM

## 2018-08-14 DIAGNOSIS — G4733 Obstructive sleep apnea (adult) (pediatric): Secondary | ICD-10-CM

## 2018-08-14 DIAGNOSIS — Z7982 Long term (current) use of aspirin: Secondary | ICD-10-CM | POA: Diagnosis not present

## 2018-08-14 DIAGNOSIS — R6521 Severe sepsis with septic shock: Secondary | ICD-10-CM | POA: Diagnosis present

## 2018-08-14 DIAGNOSIS — N179 Acute kidney failure, unspecified: Secondary | ICD-10-CM | POA: Diagnosis not present

## 2018-08-14 DIAGNOSIS — R1030 Lower abdominal pain, unspecified: Secondary | ICD-10-CM | POA: Diagnosis present

## 2018-08-14 DIAGNOSIS — E871 Hypo-osmolality and hyponatremia: Secondary | ICD-10-CM

## 2018-08-14 DIAGNOSIS — A419 Sepsis, unspecified organism: Secondary | ICD-10-CM

## 2018-08-14 DIAGNOSIS — I10 Essential (primary) hypertension: Secondary | ICD-10-CM | POA: Diagnosis present

## 2018-08-14 DIAGNOSIS — F329 Major depressive disorder, single episode, unspecified: Secondary | ICD-10-CM | POA: Diagnosis present

## 2018-08-14 DIAGNOSIS — R17 Unspecified jaundice: Secondary | ICD-10-CM | POA: Diagnosis not present

## 2018-08-14 DIAGNOSIS — F419 Anxiety disorder, unspecified: Secondary | ICD-10-CM | POA: Diagnosis present

## 2018-08-14 DIAGNOSIS — R066 Hiccough: Secondary | ICD-10-CM | POA: Diagnosis present

## 2018-08-14 DIAGNOSIS — K921 Melena: Secondary | ICD-10-CM

## 2018-08-14 DIAGNOSIS — N5089 Other specified disorders of the male genital organs: Secondary | ICD-10-CM

## 2018-08-14 DIAGNOSIS — N451 Epididymitis: Secondary | ICD-10-CM | POA: Diagnosis present

## 2018-08-14 DIAGNOSIS — K631 Perforation of intestine (nontraumatic): Secondary | ICD-10-CM | POA: Diagnosis present

## 2018-08-14 DIAGNOSIS — K626 Ulcer of anus and rectum: Secondary | ICD-10-CM | POA: Diagnosis present

## 2018-08-14 DIAGNOSIS — K219 Gastro-esophageal reflux disease without esophagitis: Secondary | ICD-10-CM | POA: Diagnosis present

## 2018-08-14 DIAGNOSIS — D62 Acute posthemorrhagic anemia: Secondary | ICD-10-CM | POA: Diagnosis present

## 2018-08-14 DIAGNOSIS — E440 Moderate protein-calorie malnutrition: Secondary | ICD-10-CM | POA: Diagnosis present

## 2018-08-14 DIAGNOSIS — N400 Enlarged prostate without lower urinary tract symptoms: Secondary | ICD-10-CM | POA: Diagnosis present

## 2018-08-14 DIAGNOSIS — Z8673 Personal history of transient ischemic attack (TIA), and cerebral infarction without residual deficits: Secondary | ICD-10-CM | POA: Diagnosis not present

## 2018-08-14 DIAGNOSIS — N170 Acute kidney failure with tubular necrosis: Secondary | ICD-10-CM | POA: Diagnosis present

## 2018-08-14 DIAGNOSIS — E785 Hyperlipidemia, unspecified: Secondary | ICD-10-CM | POA: Diagnosis present

## 2018-08-14 LAB — CBC
HCT: 21.2 % — ABNORMAL LOW (ref 39.0–52.0)
HCT: 21.2 % — ABNORMAL LOW (ref 39.0–52.0)
Hemoglobin: 6.9 g/dL — CL (ref 13.0–17.0)
Hemoglobin: 6.9 g/dL — CL (ref 13.0–17.0)
MCH: 30.9 pg (ref 26.0–34.0)
MCH: 30.3 pg (ref 26.0–34.0)
MCHC: 32.5 g/dL (ref 30.0–36.0)
MCHC: 32.5 g/dL (ref 30.0–36.0)
MCV: 95.1 fL (ref 80.0–100.0)
MCV: 93 fL (ref 80.0–100.0)
Platelets: 259 10*3/uL (ref 150–400)
Platelets: 262 10*3/uL (ref 150–400)
RBC: 2.23 MIL/uL — ABNORMAL LOW (ref 4.22–5.81)
RBC: 2.28 MIL/uL — AB (ref 4.22–5.81)
RDW: 16.3 % — ABNORMAL HIGH (ref 11.5–15.5)
RDW: 16.3 % — ABNORMAL HIGH (ref 11.5–15.5)
WBC: 31.8 10*3/uL — ABNORMAL HIGH (ref 4.0–10.5)
WBC: 28.4 10*3/uL — AB (ref 4.0–10.5)
nRBC: 0 % (ref 0.0–0.2)
nRBC: 0 % (ref 0.0–0.2)

## 2018-08-14 LAB — BASIC METABOLIC PANEL
Anion gap: 13 (ref 5–15)
BUN: 94 mg/dL — ABNORMAL HIGH (ref 6–20)
CO2: 13 mmol/L — ABNORMAL LOW (ref 22–32)
CREATININE: 6.38 mg/dL — AB (ref 0.61–1.24)
Calcium: 7.7 mg/dL — ABNORMAL LOW (ref 8.9–10.3)
Chloride: 106 mmol/L (ref 98–111)
GFR calc Af Amer: 10 mL/min — ABNORMAL LOW (ref >60)
GFR calc non Af Amer: 9 mL/min — ABNORMAL LOW (ref >60)
Glucose, Bld: 151 mg/dL — ABNORMAL HIGH (ref 70–99)
Potassium: 4.4 mmol/L (ref 3.5–5.1)
Sodium: 132 mmol/L — ABNORMAL LOW (ref 135–145)

## 2018-08-14 LAB — RETICULOCYTES
Immature Retic Fract: 19.1 % — ABNORMAL HIGH (ref 2.3–15.9)
RBC.: 2.41 MIL/uL — ABNORMAL LOW (ref 4.22–5.81)
Retic Count, Absolute: 37.4 10*3/uL (ref 19.0–186.0)
Retic Ct Pct: 1.6 % (ref 0.4–3.1)

## 2018-08-14 LAB — HEPATIC FUNCTION PANEL
ALK PHOS: 87 U/L (ref 38–126)
ALT: 28 U/L (ref 0–44)
AST: 33 U/L (ref 15–41)
Albumin: 1.5 g/dL — ABNORMAL LOW (ref 3.5–5.0)
Bilirubin, Direct: 3.6 mg/dL — ABNORMAL HIGH (ref 0.0–0.2)
Indirect Bilirubin: 1.4 mg/dL — ABNORMAL HIGH (ref 0.3–0.9)
Total Bilirubin: 5 mg/dL — ABNORMAL HIGH (ref 0.3–1.2)
Total Protein: 6.1 g/dL — ABNORMAL LOW (ref 6.5–8.1)

## 2018-08-14 LAB — SODIUM, URINE, RANDOM: Sodium, Ur: 18 mmol/L

## 2018-08-14 LAB — FERRITIN: Ferritin: 635 ng/mL — ABNORMAL HIGH (ref 24–336)

## 2018-08-14 MED ORDER — TAMSULOSIN HCL 0.4 MG PO CAPS
0.4000 mg | ORAL_CAPSULE | Freq: Every day | ORAL | Status: DC | PRN
  Administered 2018-09-02: 0.4 mg via ORAL
  Filled 2018-08-14: qty 1

## 2018-08-14 MED ORDER — SODIUM BICARBONATE 8.4 % IV SOLN
INTRAVENOUS | Status: DC
  Administered 2018-08-14 – 2018-08-15 (×2): via INTRAVENOUS
  Filled 2018-08-14 (×5): qty 150

## 2018-08-14 MED ORDER — ACETAMINOPHEN 325 MG PO TABS
650.0000 mg | ORAL_TABLET | Freq: Four times a day (QID) | ORAL | Status: DC | PRN
  Administered 2018-08-14 – 2018-08-28 (×9): 650 mg via ORAL
  Filled 2018-08-14 (×9): qty 2

## 2018-08-14 MED ORDER — PANTOPRAZOLE SODIUM 40 MG PO TBEC
40.0000 mg | DELAYED_RELEASE_TABLET | Freq: Two times a day (BID) | ORAL | Status: DC
  Administered 2018-08-14 – 2018-09-03 (×36): 40 mg via ORAL
  Filled 2018-08-14 (×36): qty 1

## 2018-08-14 MED ORDER — PRAZOSIN HCL 2 MG PO CAPS
2.0000 mg | ORAL_CAPSULE | Freq: Every day | ORAL | Status: DC
  Administered 2018-08-14 – 2018-09-02 (×20): 2 mg via ORAL
  Filled 2018-08-14 (×20): qty 1

## 2018-08-14 MED ORDER — FERROUS SULFATE 325 (65 FE) MG PO TABS
325.0000 mg | ORAL_TABLET | Freq: Every day | ORAL | Status: DC
  Administered 2018-08-15 – 2018-09-03 (×19): 325 mg via ORAL
  Filled 2018-08-14 (×20): qty 1

## 2018-08-14 MED ORDER — SODIUM CHLORIDE 0.9 % IV SOLN
INTRAVENOUS | Status: DC
  Administered 2018-08-14: 07:00:00 via INTRAVENOUS

## 2018-08-14 MED ORDER — SODIUM CHLORIDE 0.9 % IV SOLN: INTRAVENOUS | Status: DC

## 2018-08-14 MED ORDER — ATORVASTATIN CALCIUM 40 MG PO TABS
40.0000 mg | ORAL_TABLET | Freq: Every day | ORAL | Status: DC
  Administered 2018-08-14 – 2018-09-02 (×17): 40 mg via ORAL
  Filled 2018-08-14 (×16): qty 1

## 2018-08-14 MED ORDER — HEPARIN SODIUM (PORCINE) 5000 UNIT/ML IJ SOLN: 5000.0000 [IU] | Freq: Three times a day (TID) | INTRAMUSCULAR | Status: DC

## 2018-08-14 MED ORDER — PANTOPRAZOLE SODIUM 40 MG PO TBEC: 40.0000 mg | DELAYED_RELEASE_TABLET | Freq: Every day | ORAL | Status: DC

## 2018-08-14 MED ORDER — ASPIRIN 81 MG PO CHEW
81.0000 mg | CHEWABLE_TABLET | Freq: Every day | ORAL | Status: DC
  Administered 2018-08-14 – 2018-08-15 (×2): 81 mg via ORAL
  Filled 2018-08-14 (×3): qty 1

## 2018-08-14 MED ORDER — ONDANSETRON HCL 4 MG/2ML IJ SOLN
4.0000 mg | Freq: Four times a day (QID) | INTRAMUSCULAR | Status: DC | PRN
  Administered 2018-08-14: 4 mg via INTRAVENOUS
  Filled 2018-08-14: qty 2

## 2018-08-14 MED ORDER — DULOXETINE HCL 60 MG PO CPEP
60.0000 mg | ORAL_CAPSULE | Freq: Every day | ORAL | Status: DC
  Administered 2018-08-14: 60 mg via ORAL
  Filled 2018-08-14 (×2): qty 1

## 2018-08-14 MED ORDER — ADULT MULTIVITAMIN W/MINERALS CH
1.0000 | ORAL_TABLET | Freq: Every day | ORAL | Status: DC
  Administered 2018-08-14 – 2018-09-03 (×20): 1 via ORAL
  Filled 2018-08-14 (×20): qty 1

## 2018-08-14 MED ORDER — CLONAZEPAM 1 MG PO TABS
1.0000 mg | ORAL_TABLET | Freq: Every evening | ORAL | Status: DC | PRN
  Administered 2018-08-16 – 2018-08-23 (×3): 1 mg via ORAL
  Filled 2018-08-14 (×3): qty 1

## 2018-08-14 MED ORDER — CHLORPROMAZINE HCL 25 MG PO TABS: 25.0000 mg | ORAL_TABLET | Freq: Every day | ORAL | Status: DC

## 2018-08-14 MED ORDER — SODIUM BICARBONATE 8.4 % IV SOLN
25.0000 meq | Freq: Once | INTRAVENOUS | Status: AC
  Administered 2018-08-14: 25 meq via INTRAVENOUS
  Filled 2018-08-14: qty 50

## 2018-08-14 MED ORDER — ACETAMINOPHEN 650 MG RE SUPP: 650.0000 mg | Freq: Four times a day (QID) | RECTAL | Status: DC | PRN

## 2018-08-14 MED ORDER — VITAMIN D 25 MCG (1000 UNIT) PO TABS
2000.0000 [IU] | ORAL_TABLET | Freq: Every day | ORAL | Status: DC
  Administered 2018-08-14 – 2018-09-03 (×20): 2000 [IU] via ORAL
  Filled 2018-08-14 (×22): qty 2

## 2018-08-14 MED ORDER — METOPROLOL TARTRATE 50 MG PO TABS
50.0000 mg | ORAL_TABLET | Freq: Two times a day (BID) | ORAL | Status: DC
  Administered 2018-08-14 – 2018-08-23 (×18): 50 mg via ORAL
  Filled 2018-08-14 (×18): qty 1

## 2018-08-14 NOTE — Progress Notes (Signed)
RT Note:  Patient refused CPAP for tonight.  Will continue to monitor.  

## 2018-08-14 NOTE — ED Notes (Signed)
this RN attempted to change patient's bed linens due to soiled chux/sheet, patient requested refused that his significant other change his linens. Patient and significant other provided clean sheet, gown, chux and wash cloths to clean up with

## 2018-08-14 NOTE — Consult Note (Addendum)
Reason for Consult: Prostate Cancer, Acute Renal Failure, Sepsis of GI/GU Origin, Rectal Ulcer/Suspect Perforation  Referring Physician: Debbe Odea MD  Phillip Blackwell is an 58 y.o. male.   HPI:  1 - Prostate Cancer, Likely Recurrent - s/p primary external beam radiation completed 03/2017 in [facility] for larger volume Gleason 6 disease. Initial PSA 5.5 and diagnosed 2017 by Dr. Alyson Ingles.  Recent PSA Course: 06/2017 - PSA 1.1 06/2018 - PSA 2.6   2 - Acute Renal Failure - Cr 7.2 / K 5.0 by ER labs 08/14/18 on eval sepsis. [place] and CT w/o hydro or distend bladder. Baseline Cr 1.1 as recently as 07/2018.  3 - Sepsis of GI/GU Origin with Prostatitis / Epididymitis / Colitis - lactate 3.8, WBC 42k by Er labs 08/14/18 on eval fevers and malaise. UA unremarkable, CX pending. Scrotal US and Pelvic CT with inflmmation of epidydimis / prostate / peri-rectal gas c/w likely infection stemming from colon microperf seeding prostate and GU tract. No abscesses / fluid collections wtihin prostate or pelvis.   4 - Rectal Ulcer/Suspect Perforation - known DX rectal ulcer with periodic bleeding following prostate radiation 2018. Flex-Sig / BX 06/2018 of ulcer edges negative for malignancy. CT 08/14/18 with gas in peri-rectal / peri-prostatic space c/w likely microperforation.   PMH sig for mild CVA (no deficits / blood thinners). 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 His PCP is Dr. Carie Caddy with [facility].   Today " Phillip Blackwell" is seen for urgent consultaion for above. He normally follows with Dr. Alyson Ingles or [facility] for Urology care.   Past Medical History:  Diagnosis Date  . Anxiety   . Depression   . GERD (gastroesophageal reflux disease)   . Hypertension   . Rectal bleeding 07/07/2018  . Sleep apnea   . Stroke [facility])    2014    Past Surgical History:  Procedure Laterality Date  . BIOPSY  07/10/2018   Procedure: BIOPSY;  Surgeon: Otis Brace, MD;  Location:  [facility];  Service: Gastroenterology;;  . Otho Darner SIGMOIDOSCOPY N/A 07/10/2018   Procedure: FLEXIBLE SIGMOIDOSCOPY;  Surgeon: Otis Brace, MD;  Location: [facility];  Service: Gastroenterology;  Laterality: N/A;  Adult EGD Scope. Peds biopsy forceps.  Marland Kitchen HERNIA REPAIR      No family history on file.  Social History:  reports that he has never smoked. He has never used smokeless tobacco. He reports that he does not drink alcohol or use drugs.  Allergies: No Known Allergies  Medications: I have reviewed the patient's current medications.  Results for orders placed or performed during the hospital encounter of 08/13/18 (from the past 48 hour(s))  Culture, blood (Routine x 2)     Status: None (Preliminary result)   Collection Time: 08/13/18  7:20 PM  Result Value Ref Range   Specimen Description BLOOD RIGHT ARM    Special Requests      BOTTLES DRAWN AEROBIC AND ANAEROBIC Blood Culture adequate volume   Culture NO GROWTH < 12 HOURS    Report Status PENDING   Culture, blood (Routine x 2)     Status: None (Preliminary result)   Collection Time: 08/13/18  7:40 PM  Result Value Ref Range   Specimen Description BLOOD LEFT ARM    Special Requests      BOTTLES DRAWN AEROBIC ONLY Blood Culture results may not be optimal due to an excessive volume of blood received in culture bottles   Culture NO GROWTH < 12  HOURS    Report Status PENDING   Comprehensive metabolic panel     Status: Abnormal   Collection Time: 08/13/18  8:00 PM  Result Value Ref Range   Sodium 130 (L) 135 - 145 mmol/L   Potassium 5.0 3.5 - 5.1 mmol/L   Chloride 98 98 - 111 mmol/L   CO2 17 (L) 22 - 32 mmol/L   Glucose, Bld 157 (H) 70 - 99 mg/dL   BUN 92 (H) 6 - 20 mg/dL   Creatinine, Ser 7.23 (H) 0.61 - 1.24 mg/dL   Calcium 9.0 8.9 - 10.3 mg/dL   Total Protein 7.8 6.5 - 8.1 g/dL   Albumin 2.0 (L) 3.5 - 5.0 g/dL   AST 50 (H) 15 - 41 U/L   ALT 41 0 - 44 U/L   Alkaline Phosphatase 118 38 - 126 U/L   Total  Bilirubin 6.4 (H) 0.3 - 1.2 mg/dL   GFR calc non Af Amer 8 (L) >60 mL/min   GFR calc Af Amer 9 (L) >60 mL/min   Anion gap 15 5 - 15    Comment: Performed at [facility], [address], [place], [place] [address]  CBC with Differential     Status: Abnormal   Collection Time: 08/13/18  8:00 PM  Result Value Ref Range   WBC 41.7 (H) 4.0 - 10.5 K/uL   RBC 2.88 (L) 4.22 - 5.81 MIL/uL   Hemoglobin 8.4 (L) 13.0 - 17.0 g/dL   HCT 26.9 (L) 39.0 - 52.0 %   MCV 93.4 80.0 - 100.0 fL   MCH 29.2 26.0 - 34.0 pg   MCHC 31.2 30.0 - 36.0 g/dL   RDW 16.0 (H) 11.5 - 15.5 %   Platelets 284 150 - 400 K/uL   nRBC 0.0 0.0 - 0.2 %   Neutrophils Relative % 90 %   Neutro Abs 37.4 (H) 1.7 - 7.7 K/uL   Lymphocytes Relative 3 %   Lymphs Abs 1.2 0.7 - 4.0 K/uL   Monocytes Relative 5 %   Monocytes Absolute 2.1 (H) 0.1 - 1.0 K/uL   Eosinophils Relative 0 %   Eosinophils Absolute 0.1 0.0 - 0.5 K/uL   Basophils Relative 0 %   Basophils Absolute 0.1 0.0 - 0.1 K/uL   WBC Morphology See Note     Comment: Increased Bands. >20% Bands  Toxic Granulation  Vaculated Neutrophils  Dohle Bodies    Immature Granulocytes 2 %   Abs Immature Granulocytes 0.90 (H) 0.00 - 0.07 K/uL   Polychromasia PRESENT     Comment: Performed at [facility] [address] [place], [place] [address]  Protime-INR     Status: None   Collection Time: 08/13/18  8:00 PM  Result Value Ref Range   Prothrombin Time 14.9 11.4 - 15.2 seconds   INR 1.18     Comment: Performed at [facility], [place] [address] [place], [place] [address]  Urinalysis, Routine w reflex microscopic     Status: Abnormal   Collection Time: 08/13/18  8:10 PM  Result Value Ref Range   Color, Urine AMBER (A) YELLOW    Comment: BIOCHEMICALS MAY BE AFFECTED BY COLOR   APPearance CLOUDY (A) CLEAR   Specific Gravity, Urine 1.014 1.005 - 1.030   pH 5.0 5.0 - 8.0   Glucose, UA NEGATIVE NEGATIVE mg/dL   Hgb urine dipstick NEGATIVE NEGATIVE   Bilirubin  Urine NEGATIVE NEGATIVE   Ketones, ur NEGATIVE NEGATIVE mg/dL   Protein,  ur 30 (A) NEGATIVE mg/dL   Nitrite NEGATIVE NEGATIVE   Leukocytes, UA NEGATIVE NEGATIVE   RBC / HPF 0-5 0 - 5 RBC/hpf   WBC, UA 0-5 0 - 5 WBC/hpf   Bacteria, UA RARE (A) NONE SEEN   Squamous Epithelial / LPF 0-5 0 - 5   Mucus PRESENT    Hyaline Casts, UA PRESENT    Amorphous Crystal PRESENT     Comment: Performed at [facility], [place] [address] [place], [place] [address]  I-Stat CG4 Lactic Acid, ED     Status: Abnormal   Collection Time: 08/13/18  8:23 PM  Result Value Ref Range   Lactic Acid, Venous 3.80 (HH) 0.5 - 1.9 mmol/L   Comment NOTIFIED PHYSICIAN   POC occult blood, ED     Status: Abnormal   Collection Time: 08/13/18  8:28 PM  Result Value Ref Range   Fecal Occult Bld POSITIVE (A) NEGATIVE  Lipase, blood     Status: None   Collection Time: 08/13/18  8:47 PM  Result Value Ref Range   Lipase 33 11 - 51 U/L    Comment: Performed at [facility], [place] [address], [place], [place] [address]  POC CBG, ED     Status: Abnormal   Collection Time: 08/13/18 11:08 PM  Result Value Ref Range   Glucose-Capillary 156 (H) 70 - 99 mg/dL  I-Stat CG4 Lactic Acid, ED     Status: Abnormal   Collection Time: 08/13/18 11:13 PM  Result Value Ref Range   Lactic Acid, Venous 1.95 (H) 0.5 - 1.9 mmol/L  Hepatic function panel     Status: Abnormal   Collection Time: 08/14/18  4:38 AM  Result Value Ref Range   Total Protein 6.1 (L) 6.5 - 8.1 g/dL   Albumin 1.5 (L) 3.5 - 5.0 g/dL   AST 33 15 - 41 U/L   ALT 28 0 - 44 U/L   Alkaline Phosphatase 87 38 - 126 U/L   Total Bilirubin 5.0 (H) 0.3 - 1.2 mg/dL   Bilirubin, Direct 3.6 (H) 0.0 - 0.2 mg/dL   Indirect Bilirubin 1.4 (H) 0.3 - 0.9 mg/dL    Comment: Performed at [facility] [address], [place],  [address]  Basic metabolic panel     Status: Abnormal   Collection Time: 08/14/18  4:38 AM  Result Value Ref Range   Sodium 132 (L) 135 - 145  mmol/L   Potassium 4.4 3.5 - 5.1 mmol/L   Chloride 106 98 - 111 mmol/L   CO2 13 (L) 22 - 32 mmol/L   Glucose, Bld 151 (H) 70 - 99 mg/dL   BUN 94 (H) 6 - 20 mg/dL   Creatinine, Ser 6.38 (H) 0.61 - 1.24 mg/dL   Calcium 7.7 (L) 8.9 - 10.3 mg/dL   GFR calc non Af Amer 9 (L) >60 mL/min   GFR calc Af Amer 10 (L) >60 mL/min   Anion gap 13 5 - 15    Comment: Performed at [facility] [address] [place], [place] [address]  CBC     Status: Abnormal   Collection Time: 08/14/18  4:38 AM  Result Value Ref Range   WBC 31.8 (H) 4.0 - 10.5 K/uL   RBC 2.23 (L) 4.22 - 5.81 MIL/uL   Hemoglobin 6.9 (LL) 13.0 - 17.0 g/dL    Comment: REPEATED TO VERIFY THIS CRITICAL RESULT HAS VERIFIED AND BEEN CALLED TO A. POWELL RN BY Jarrett Soho  MILES ON 01 04 2020 AT 0505, AND HAS BEEN READ BACK.     HCT 21.2 (L) 39.0 - 52.0 %   MCV 95.1 80.0 - 100.0 fL   MCH 30.9 26.0 - 34.0 pg   MCHC 32.5 30.0 - 36.0 g/dL   RDW 16.3 (H) 11.5 - 15.5 %   Platelets 259 150 - 400 K/uL   nRBC 0.0 0.0 - 0.2 %    Comment: Performed at [facility] [address] [place], [place] [address]  CBC     Status: Abnormal   Collection Time: 08/14/18  9:16 AM  Result Value Ref Range   WBC 28.4 (H) 4.0 - 10.5 K/uL   RBC 2.28 (L) 4.22 - 5.81 MIL/uL   Hemoglobin 6.9 (LL) 13.0 - 17.0 g/dL    Comment: REPEATED TO VERIFY CRITICAL VALUE NOTED.  VALUE IS CONSISTENT WITH PREVIOUSLY REPORTED AND CALLED VALUE.    HCT 21.2 (L) 39.0 - 52.0 %   MCV 93.0 80.0 - 100.0 fL   MCH 30.3 26.0 - 34.0 pg   MCHC 32.5 30.0 - 36.0 g/dL   RDW 16.3 (H) 11.5 - 15.5 %   Platelets 262 150 - 400 K/uL   nRBC 0.0 0.0 - 0.2 %    Comment: Performed at [facility] [address], [place],  [address]    Ct Abdomen Pelvis Wo Contrast  Result Date: 08/13/2018 CLINICAL DATA:  Groin swelling EXAM: CT ABDOMEN AND PELVIS WITHOUT CONTRAST TECHNIQUE: Multidetector CT imaging of the abdomen and pelvis was performed following the standard protocol  without IV contrast. COMPARISON:  MRI 07/10/2018 FINDINGS: Lower chest: Lung bases demonstrate no acute consolidation or effusion. The heart size is normal. Small hiatal hernia. Hepatobiliary: No focal liver abnormality is seen. No gallstones, gallbladder wall thickening, or biliary dilatation. Pancreas: Unremarkable. No pancreatic ductal dilatation or surrounding inflammatory changes. Spleen: Normal in size without focal abnormality. Adrenals/Urinary Tract: Adrenal glands are within normal limits. No hydronephrosis. Thick-walled urinary bladder Stomach/Bowel: Stomach is nonenlarged. No dilated small bowel. Extensive gas collection Between the rectum and prostate. Vascular/Lymphatic: Mild aortic atherosclerosis. No aneurysm. No significantly enlarged lymph nodes. Reproductive: Metallic densities along the posterior aspect of the prostate. Large gas collections around the prostate gland. Moderate edema of the scrotum and around the penis. Other: No free air. Fat in the left inguinal canal. Edema within the pubic soft tissues. Gas and fluid collection extending from the rectal area and along both sides of the prostate gland. Musculoskeletal: No acute or suspicious abnormality. IMPRESSION: 1. Moderate edema and mild inflammatory changes involving the scrotum and soft tissues about the penis, but without soft tissue emphysema. Mild edema within the pubic soft tissues. 2. Abnormal gas and fluid collection between the rectum and prostate gland and extending along the right and left aspects of the prostate gland, felt to correspond to previously demonstrated rectal ulcer and sinus tracks. 3. Thick-walled appearance of the urinary bladder, possible cystitis Electronically Signed   By: Donavan Foil M.D.   On: 08/13/2018 23:19   Dg Chest 2 View  Result Date: 08/13/2018 CLINICAL DATA:  Acute onset of genital swelling. Fever. EXAM: CHEST - 2 VIEW COMPARISON:  Chest radiograph performed 07/07/2018 FINDINGS: The lungs are  well-aerated. Minimal left basilar atelectasis is noted. There is no evidence of pleural effusion or pneumothorax. The heart is normal in size; the mediastinal contour is within normal limits. No acute osseous abnormalities are seen. IMPRESSION: Minimal left basilar atelectasis noted. Lungs  otherwise clear. Electronically Signed   By: Garald Balding M.D.   On: 08/13/2018 22:16   US Renal  Result Date: 08/14/2018 CLINICAL DATA:  Acute renal failure. EXAM: RENAL / URINARY TRACT ULTRASOUND COMPLETE COMPARISON:  Noncontrast CT yesterday. FINDINGS: Right Kidney: Renal measurements: 12.6 x 5.2 x 6.1 cm = volume: 207 mL . Echogenicity within normal limits. No mass or hydronephrosis visualized. Left Kidney: Renal measurements: 12.8 x 5.1 x 5.7 cm = volume: 195 mL. Echogenicity within normal limits. No mass or hydronephrosis visualized. Bladder: Bladder wall thickening of 9 mm, as seen on CT. IMPRESSION: 1. Bladder wall thickening, similar to CT yesterday. 2. Unremarkable sonographic appearance of both kidneys. No hydronephrosis. Electronically Signed   By: Keith Rake M.D.   On: 08/14/2018 03:48   US Scrotum W/doppler  Result Date: 08/13/2018 CLINICAL DATA:  Initial evaluation for acute testicular pain, swelling. EXAM: SCROTAL ULTRASOUND DOPPLER ULTRASOUND OF THE TESTICLES TECHNIQUE: Complete ultrasound examination of the testicles, epididymis, and other scrotal structures was performed. Color and spectral Doppler ultrasound were also utilized to evaluate blood flow to the testicles. COMPARISON:  None. FINDINGS: Right testicle Measurements: 3.5 x 2.5 x 1.9 cm. No mass lesion. Testicular microlithiasis noted. Left testicle Measurements: 3.9 x 2.2 x 2.3 cm. No mass lesion. Testicular microlithiasis noted. Right epididymis:  Normal in size and appearance. Left epididymis: Diffusely increased vascularity seen within the left epididymis, suggesting acute epididymitis. Hydrocele:  Bilateral hydroceles noted.  Varicocele:  Bilateral varicoceles noted. Pulsed Doppler interrogation of both testes demonstrates normal low resistance arterial and venous waveforms bilaterally. Incidental note made of a probable small fat containing right inguinal hernia. IMPRESSION: 1. Increased vascularity within the left epididymis, suggesting acute epididymitis. No evidence for testicular torsion. 2. Small moderate bilateral hydroceles, likely reactive. 3. Small bilateral varicoceles. 4. Probable small fat containing right inguinal hernia. 5. Testicular microlithiasis. Current literature suggests that testicular microlithiasis is not a significant independent risk factor for development of testicular carcinoma, and that follow up imaging is not warranted in the absence of other risk factors. Monthly testicular self-examination and annual physical exams are considered appropriate surveillance. If patient has other risk factors for testicular carcinoma, then referral to Urology should be considered. (Reference: DeCastro, et al.: A 5-Year Follow up Study of Asymptomatic Men with Testicular Microlithiasis. J Urol 2008; 128:7867-6720.) Electronically Signed   By: Jeannine Boga M.D.   On: 08/13/2018 22:48   US Abdomen Limited Ruq  Result Date: 08/14/2018 CLINICAL DATA:  Initial evaluation for elevated AST with leukocytosis. EXAM: ULTRASOUND ABDOMEN LIMITED RIGHT UPPER QUADRANT COMPARISON:  None. FINDINGS: Gallbladder: No gallstones or wall thickening visualized. No sonographic Murphy sign noted by sonographer. Common bile duct: Diameter: 4.6 mm Liver: No focal lesion identified. Within normal limits in parenchymal echogenicity. Portal vein is patent on color Doppler imaging with normal direction of blood flow towards the liver. IMPRESSION: Normal right upper quadrant ultrasound. No cholelithiasis, evidence for acute cholecystitis, or biliary dilatation. Electronically Signed   By: Jeannine Boga M.D.   On: 08/14/2018 03:43     Review of Systems  Constitutional: Positive for chills, diaphoresis, fever and malaise/fatigue.  HENT: Negative.   Eyes: Negative.   Respiratory: Negative.   Cardiovascular: Negative.   Gastrointestinal: Positive for abdominal pain, blood in stool and nausea.  Genitourinary: Negative for frequency, hematuria and urgency.  Musculoskeletal: Negative.   Neurological: Negative.   Endo/Heme/Allergies: Negative.   Psychiatric/Behavioral: Negative.    Blood pressure (!) 112/50, pulse (!) 104, temperature 99 F (  37.2 C), temperature source Oral, resp. rate (!) 22, height 6' (1.829 m), weight 86.2 kg, SpO2 96 %. Physical Exam  Constitutional: He appears well-developed.  Daughter with him at bedside.   HENT:  Head: Normocephalic.  Eyes: Pupils are equal, round, and reactive to light.  Neck: Normal range of motion.  Cardiovascular:  Regular tachycardia by bedside monitor  Respiratory:  Tachypnic, but not-labored.   GI: Soft. There is no abdominal tenderness. There is no rebound and no guarding.  Genitourinary:    Genitourinary Comments: Penoscrotal swelling / tenderness w/o fluctuence or crepitus. DRE with ? Rectal defect Rt lateral of prostate   Musculoskeletal: Normal range of motion.  Neurological: He is alert.  Skin: Skin is warm. He is diaphoretic.  Psychiatric: He has a normal mood and affect. His behavior is normal.    Assessment/Plan:  1 - Prostate Cancer, Likely Recurrent - PSA pattern most c/w active disease, but no signs of metastatic involvement. May warrant tissue sampling via transperineal approach in elective setting.   2 - Acute Renal Failure - pre-renal due to sepsis. No hydro to warrant stentig / neph tubes. Rec foley for accurate UOP monitoring, I placed this at bedside.   3 - Sepsis of GI/GU Origin with Prostatitis / Epididymitis / Colitis - I am concerned this is stemming from rectal rectal perforation with seeding of prostate, GU tract, and likely bacteremia  based on imaging, labs, history. No indications for GU surgery / drainable collections. Agree with aggressive broad spectrum ABX renal dosed pedning additional CX data. Suspect coliforms.    4 - Rectal Ulcer/Suspect Perforation - likely from progression of known radiation-induced rectal ulcer, very well may ultimately need near term GI and possible eventual GU diversion (if still has problems despite GI diversion). Rec gen surg opinion.   Please call me directly with questions anytime.   Alexis Frock 08/14/2018, 10:52 AM

## 2018-08-14 NOTE — Consult Note (Signed)
Reason for Consult:chronic rectal perforation Referring Physician: [name] is an 58 y.o. male.  HPI: Phillip Blackwell has a history of recurrent prostate cancer.  He underwent prostate radiation in 2018.  He has had a chronic rectal ulcer/perforation for some time.  He was seen by my partner, Dr. Dema Severin in our office regarding this in the past.  He presented the emergency room with fevers and pain and swelling in his penis and testicle.  He was noted to be in acute renal failure with leukocytosis.  CT scan demonstrates this chronic rectal perforation without a discrete abscess.  He was seen by Dr. Bess Harvest from urology and he asked [place] to evaluate because he may need a diverting colostomy at some point.  Past Medical History:  Diagnosis Date  . Anxiety   . Depression   . GERD (gastroesophageal reflux disease)   . Hypertension   . Rectal bleeding 07/07/2018  . Sleep apnea   . Stroke Phillip Blackwell)    2014    Past Surgical History:  Procedure Laterality Date  . BIOPSY  07/10/2018   Procedure: BIOPSY;  Surgeon: Otis Brace, MD;  Location: [place];  Service: Gastroenterology;;  . Otho Darner SIGMOIDOSCOPY N/A 07/10/2018   Procedure: FLEXIBLE SIGMOIDOSCOPY;  Surgeon: Otis Brace, MD;  Location: [facility];  Service: Gastroenterology;  Laterality: N/A;  Adult EGD Scope. Peds biopsy forceps.  Marland Kitchen HERNIA REPAIR      No family history on file.  Social History:  reports that he has never smoked. He has never used smokeless tobacco. He reports that he does not drink alcohol or use drugs.  Allergies: No Known Allergies  Medications: I have reviewed the patient's current medications.  Results for orders placed or performed during the Blackwell encounter of 08/13/18 (from the past 48 hour(s))  Culture, blood (Routine x 2)     Status: None (Preliminary result)   Collection Time: 08/13/18  7:20 PM  Result Value Ref Range   Specimen Description BLOOD RIGHT ARM    Special Requests       BOTTLES DRAWN AEROBIC AND ANAEROBIC Blood Culture adequate volume   Culture NO GROWTH < 12 HOURS    Report Status PENDING   Culture, blood (Routine x 2)     Status: None (Preliminary result)   Collection Time: 08/13/18  7:40 PM  Result Value Ref Range   Specimen Description BLOOD LEFT ARM    Special Requests      BOTTLES DRAWN AEROBIC ONLY Blood Culture results may not be optimal due to an excessive volume of blood received in culture bottles   Culture NO GROWTH < 12 HOURS    Report Status PENDING   Comprehensive metabolic panel     Status: Abnormal   Collection Time: 08/13/18  8:00 PM  Result Value Ref Range   Sodium 130 (L) 135 - 145 mmol/L   Potassium 5.0 3.5 - 5.1 mmol/L   Chloride 98 98 - 111 mmol/L   CO2 17 (L) 22 - 32 mmol/L   Glucose, Bld 157 (H) 70 - 99 mg/dL   BUN 92 (H) 6 - 20 mg/dL   Creatinine, Ser 7.23 (H) 0.61 - 1.24 mg/dL   Calcium 9.0 8.9 - 10.3 mg/dL   Total Protein 7.8 6.5 - 8.1 g/dL   Albumin 2.0 (L) 3.5 - 5.0 g/dL   AST 50 (H) 15 - 41 U/L   ALT 41 0 - 44 U/L   Alkaline Phosphatase 118 38 - 126 U/L  Total Bilirubin 6.4 (H) 0.3 - 1.2 mg/dL   GFR calc non Af Amer 8 (L) >60 mL/min   GFR calc Af Amer 9 (L) >60 mL/min   Anion gap 15 5 - 15    Comment: Performed at [facility] [address], [place], [place] [address]  CBC with Differential     Status: Abnormal   Collection Time: 08/13/18  8:00 PM  Result Value Ref Range   WBC 41.7 (H) 4.0 - 10.5 K/uL   RBC 2.88 (L) 4.22 - 5.81 MIL/uL   Hemoglobin 8.4 (L) 13.0 - 17.0 g/dL   HCT 26.9 (L) 39.0 - 52.0 %   MCV 93.4 80.0 - 100.0 fL   MCH 29.2 26.0 - 34.0 pg   MCHC 31.2 30.0 - 36.0 g/dL   RDW 16.0 (H) 11.5 - 15.5 %   Platelets 284 150 - 400 K/uL   nRBC 0.0 0.0 - 0.2 %   Neutrophils Relative % 90 %   Neutro Abs 37.4 (H) 1.7 - 7.7 K/uL   Lymphocytes Relative 3 %   Lymphs Abs 1.2 0.7 - 4.0 K/uL   Monocytes Relative 5 %   Monocytes Absolute 2.1 (H) 0.1 - 1.0 K/uL   Eosinophils Relative 0 %    Eosinophils Absolute 0.1 0.0 - 0.5 K/uL   Basophils Relative 0 %   Basophils Absolute 0.1 0.0 - 0.1 K/uL   WBC Morphology See Note     Comment: Increased Bands. >20% Bands  Toxic Granulation  Vaculated Neutrophils  Dohle Bodies    Immature Granulocytes 2 %   Abs Immature Granulocytes 0.90 (H) 0.00 - 0.07 K/uL   Polychromasia PRESENT     Comment: Performed at [facility] [address], [place], [place] [address]  Protime-INR     Status: None   Collection Time: 08/13/18  8:00 PM  Result Value Ref Range   Prothrombin Time 14.9 11.4 - 15.2 seconds   INR 1.18     Comment: Performed at [facility], [place] [address], [place], [place] [address]  Urinalysis, Routine w reflex microscopic     Status: Abnormal   Collection Time: 08/13/18  8:10 PM  Result Value Ref Range   Color, Urine AMBER (A) YELLOW    Comment: BIOCHEMICALS MAY BE AFFECTED BY COLOR   APPearance CLOUDY (A) CLEAR   Specific Gravity, Urine 1.014 1.005 - 1.030   pH 5.0 5.0 - 8.0   Glucose, UA NEGATIVE NEGATIVE mg/dL   Hgb urine dipstick NEGATIVE NEGATIVE   Bilirubin Urine NEGATIVE NEGATIVE   Ketones, ur NEGATIVE NEGATIVE mg/dL   Protein, ur 30 (A) NEGATIVE mg/dL   Nitrite NEGATIVE NEGATIVE   Leukocytes, UA NEGATIVE NEGATIVE   RBC / HPF 0-5 0 - 5 RBC/hpf   WBC, UA 0-5 0 - 5 WBC/hpf   Bacteria, UA RARE (A) NONE SEEN   Squamous Epithelial / LPF 0-5 0 - 5   Mucus PRESENT    Hyaline Casts, UA PRESENT    Amorphous Crystal PRESENT     Comment: Performed at [facility], [address] [place], [place] [address]  I-Stat CG4 Lactic Acid, ED     Status: Abnormal   Collection Time: 08/13/18  8:23 PM  Result Value Ref Range   Lactic Acid, Venous 3.80 (HH) 0.5 - 1.9 mmol/L   Comment NOTIFIED PHYSICIAN   POC occult blood, ED     Status: Abnormal   Collection Time: 08/13/18  8:28 PM  Result Value Ref Range   Fecal Occult Bld POSITIVE (A) NEGATIVE  Sodium, urine, random     Status: None   Collection Time:  08/13/18  8:34 PM  Result Value Ref Range   Sodium, Ur 18 mmol/L    Comment: Performed at [facility] [address], [place], [place] [address]  Lipase, blood     Status: None   Collection Time: 08/13/18  8:47 PM  Result Value Ref Range   Lipase 33 11 - 51 U/L    Comment: Performed at [facility] [address] [place], [place] [address]  POC CBG, ED     Status: Abnormal   Collection Time: 08/13/18 11:08 PM  Result Value Ref Range   Glucose-Capillary 156 (H) 70 - 99 mg/dL  I-Stat CG4 Lactic Acid, ED     Status: Abnormal   Collection Time: 08/13/18 11:13 PM  Result Value Ref Range   Lactic Acid, Venous 1.95 (H) 0.5 - 1.9 mmol/L  Hepatic function panel     Status: Abnormal   Collection Time: 08/14/18  4:38 AM  Result Value Ref Range   Total Protein 6.1 (L) 6.5 - 8.1 g/dL   Albumin 1.5 (L) 3.5 - 5.0 g/dL   AST 33 15 - 41 U/L   ALT 28 0 - 44 U/L   Alkaline Phosphatase 87 38 - 126 U/L   Total Bilirubin 5.0 (H) 0.3 - 1.2 mg/dL   Bilirubin, Direct 3.6 (H) 0.0 - 0.2 mg/dL   Indirect Bilirubin 1.4 (H) 0.3 - 0.9 mg/dL    Comment: Performed at [facility] [address], [place], [place] [address]  Basic metabolic panel     Status: Abnormal   Collection Time: 08/14/18  4:38 AM  Result Value Ref Range   Sodium 132 (L) 135 - 145 mmol/L   Potassium 4.4 3.5 - 5.1 mmol/L   Chloride 106 98 - 111 mmol/L   CO2 13 (L) 22 - 32 mmol/L   Glucose, Bld 151 (H) 70 - 99 mg/dL   BUN 94 (H) 6 - 20 mg/dL   Creatinine, Ser 6.38 (H) 0.61 - 1.24 mg/dL   Calcium 7.7 (L) 8.9 - 10.3 mg/dL   GFR calc non Af Amer 9 (L) >60 mL/min   GFR calc Af Amer 10 (L) >60 mL/min   Anion gap 13 5 - 15    Comment: Performed at [facility] [address] [place], [place] [address]  CBC     Status: Abnormal   Collection Time: 08/14/18  4:38 AM  Result Value Ref Range   WBC 31.8 (H) 4.0 - 10.5 K/uL   RBC 2.23 (L) 4.22 - 5.81 MIL/uL   Hemoglobin 6.9 (LL) 13.0 - 17.0 g/dL    Comment:  REPEATED TO VERIFY THIS CRITICAL RESULT HAS VERIFIED AND BEEN CALLED TO A. POWELL RN BY HANNAH MILES ON 01 04 2020 AT 0505, AND HAS BEEN READ BACK.     HCT 21.2 (L) 39.0 - 52.0 %   MCV 95.1 80.0 - 100.0 fL   MCH 30.9 26.0 - 34.0 pg   MCHC 32.5 30.0 - 36.0 g/dL   RDW 16.3 (H) 11.5 - 15.5 %   Platelets 259 150 - 400 K/uL   nRBC 0.0 0.0 - 0.2 %    Comment: Performed at [facility] [address] [place], [place] [address]  CBC     Status: Abnormal   Collection Time: 08/14/18  9:16 AM  Result Value Ref Range   WBC 28.4 (H) 4.0 - 10.5 K/uL   RBC 2.28 (L) 4.22 - 5.81 MIL/uL   Hemoglobin 6.9 (LL) 13.0 - 17.0 g/dL    Comment: REPEATED TO VERIFY CRITICAL VALUE NOTED.  VALUE IS CONSISTENT WITH PREVIOUSLY REPORTED AND CALLED VALUE.    HCT 21.2 (L) 39.0 - 52.0 %   MCV 93.0 80.0 - 100.0 fL   MCH 30.3 26.0 - 34.0 pg   MCHC 32.5 30.0 - 36.0 g/dL   RDW 16.3 (H) 11.5 - 15.5 %   Platelets 262 150 - 400 K/uL   nRBC 0.0 0.0 - 0.2 %    Comment: Performed at [facility] [address], [place], [place] [address]    Ct Abdomen Pelvis Wo Contrast  Result Date: 08/13/2018 CLINICAL DATA:  Groin swelling EXAM: CT ABDOMEN AND PELVIS WITHOUT CONTRAST TECHNIQUE: Multidetector CT imaging of the abdomen and pelvis was performed following the standard protocol without IV contrast. COMPARISON:  MRI 07/10/2018 FINDINGS: Lower chest: Lung bases demonstrate no acute consolidation or effusion. The heart size is normal. Small hiatal hernia. Hepatobiliary: No focal liver abnormality is seen. No gallstones, gallbladder wall thickening, or biliary dilatation. Pancreas: Unremarkable. No pancreatic ductal dilatation or surrounding inflammatory changes. Spleen: Normal in size without focal abnormality. Adrenals/Urinary Tract: Adrenal glands are within normal limits. No hydronephrosis. Thick-walled urinary bladder Stomach/Bowel: Stomach is nonenlarged. No dilated small bowel. Extensive gas collection Between the  rectum and prostate. Vascular/Lymphatic: Mild aortic atherosclerosis. No aneurysm. No significantly enlarged lymph nodes. Reproductive: Metallic densities along the posterior aspect of the prostate. Large gas collections around the prostate gland. Moderate edema of the scrotum and around the penis. Other: No free air. Fat in the left inguinal canal. Edema within the pubic soft tissues. Gas and fluid collection extending from the rectal area and along both sides of the prostate gland. Musculoskeletal: No acute or suspicious abnormality. IMPRESSION: 1. Moderate edema and mild inflammatory changes involving the scrotum and soft tissues about the penis, but without soft tissue emphysema. Mild edema within the pubic soft tissues. 2. Abnormal gas and fluid collection between the rectum and prostate gland and extending along the right and left aspects of the prostate gland, felt to correspond to previously demonstrated rectal ulcer and sinus tracks. 3. Thick-walled appearance of the urinary bladder, possible cystitis Electronically Signed   By: Donavan Foil M.D.   On: 08/13/2018 23:19   Dg Chest 2 View  Result Date: 08/13/2018 CLINICAL DATA:  Acute onset of genital swelling. Fever. EXAM: CHEST - 2 VIEW COMPARISON:  Chest radiograph performed 07/07/2018 FINDINGS: The lungs are well-aerated. Minimal left basilar atelectasis is noted. There is no evidence of pleural effusion or pneumothorax. The heart is normal in size; the mediastinal contour is within normal limits. No acute osseous abnormalities are seen. IMPRESSION: Minimal left basilar atelectasis noted. Lungs otherwise clear. Electronically Signed   By: Garald Balding M.D.   On: 08/13/2018 22:16   US Renal  Result Date: 08/14/2018 CLINICAL DATA:  Acute renal failure. EXAM: RENAL / URINARY TRACT ULTRASOUND COMPLETE COMPARISON:  Noncontrast CT yesterday. FINDINGS: Right Kidney: Renal measurements: 12.6 x 5.2 x 6.1 cm = volume: 207 mL . Echogenicity within normal  limits. No mass or hydronephrosis visualized. Left Kidney: Renal measurements: 12.8 x 5.1 x 5.7 cm = volume: 195 mL. Echogenicity within normal limits. No mass or hydronephrosis visualized. Bladder: Bladder wall thickening of 9 mm, as seen on CT. IMPRESSION: 1.  Bladder wall thickening, similar to CT yesterday. 2. Unremarkable sonographic appearance of both kidneys. No hydronephrosis. Electronically Signed   By: Keith Rake M.D.   On: 08/14/2018 03:48   US Scrotum W/doppler  Result Date: 08/13/2018 CLINICAL DATA:  Initial evaluation for acute testicular pain, swelling. EXAM: SCROTAL ULTRASOUND DOPPLER ULTRASOUND OF THE TESTICLES TECHNIQUE: Complete ultrasound examination of the testicles, epididymis, and other scrotal structures was performed. Color and spectral Doppler ultrasound were also utilized to evaluate blood flow to the testicles. COMPARISON:  None. FINDINGS: Right testicle Measurements: 3.5 x 2.5 x 1.9 cm. No mass lesion. Testicular microlithiasis noted. Left testicle Measurements: 3.9 x 2.2 x 2.3 cm. No mass lesion. Testicular microlithiasis noted. Right epididymis:  Normal in size and appearance. Left epididymis: Diffusely increased vascularity seen within the left epididymis, suggesting acute epididymitis. Hydrocele:  Bilateral hydroceles noted. Varicocele:  Bilateral varicoceles noted. Pulsed Doppler interrogation of both testes demonstrates normal low resistance arterial and venous waveforms bilaterally. Incidental note made of a probable small fat containing right inguinal hernia. IMPRESSION: 1. Increased vascularity within the left epididymis, suggesting acute epididymitis. No evidence for testicular torsion. 2. Small moderate bilateral hydroceles, likely reactive. 3. Small bilateral varicoceles. 4. Probable small fat containing right inguinal hernia. 5. Testicular microlithiasis. Current literature suggests that testicular microlithiasis is not a significant independent risk factor for  development of testicular carcinoma, and that follow up imaging is not warranted in the absence of other risk factors. Monthly testicular self-examination and annual physical exams are considered appropriate surveillance. If patient has other risk factors for testicular carcinoma, then referral to Urology should be considered. (Reference: DeCastro, et al.: A 5-Year Follow up Study of Asymptomatic Men with Testicular Microlithiasis. J Urol 2008; 440:1027-2536.) Electronically Signed   By: Jeannine Boga M.D.   On: 08/13/2018 22:48   US Abdomen Limited Ruq  Result Date: 08/14/2018 CLINICAL DATA:  Initial evaluation for elevated AST with leukocytosis. EXAM: ULTRASOUND ABDOMEN LIMITED RIGHT UPPER QUADRANT COMPARISON:  None. FINDINGS: Gallbladder: No gallstones or wall thickening visualized. No sonographic Murphy sign noted by sonographer. Common bile duct: Diameter: 4.6 mm Liver: No focal lesion identified. Within normal limits in parenchymal echogenicity. Portal vein is patent on color Doppler imaging with normal direction of blood flow towards the liver. IMPRESSION: Normal right upper quadrant ultrasound. No cholelithiasis, evidence for acute cholecystitis, or biliary dilatation. Electronically Signed   By: Jeannine Boga M.D.   On: 08/14/2018 03:43    Review of Systems  Constitutional: Positive for fever and malaise/fatigue.  HENT: Negative.   Eyes: Negative.   Respiratory: Negative for cough and shortness of breath.   Cardiovascular: Negative for chest pain.  Gastrointestinal: Positive for blood in stool. Negative for abdominal pain, nausea and vomiting.  Genitourinary:       Pain and swelling penis and scrotum  Musculoskeletal: Negative.   Neurological: Negative.   Endo/Heme/Allergies: Negative.   Psychiatric/Behavioral: Negative.    Blood pressure 134/65, pulse (!) 118, temperature 99 F (37.2 C), temperature source Oral, resp. rate (!) 33, height 6' (1.829 m), weight 86.2 kg, SpO2  99 %. Physical Exam  Constitutional: He is oriented to person, place, and time. He appears well-developed and well-nourished. No distress.  HENT:  Head: Normocephalic.  Mouth/Throat: Oropharynx is clear and moist.  Eyes: Pupils are equal, round, and reactive to light. EOM are normal.  Neck: No tracheal deviation present. No thyromegaly present.  Cardiovascular: Normal rate, regular rhythm, normal heart sounds and intact distal pulses.  Respiratory: Effort normal  and breath sounds normal. No respiratory distress. He has no wheezes.  GI: Soft. He exhibits no distension. There is abdominal tenderness. There is no rebound and no guarding.  Some low suprapubic tenderness  Genitourinary:    Genitourinary Comments: Significant edema penis and scrotum, Foley in place   Neurological: He is alert and oriented to person, place, and time.  Psychiatric: He has a normal mood and affect.    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  He may require a colostomy to divert the fecal stream at some point.  This does not need to be done emergently now.  We will follow along.  I also spoke with his family and Dr. Tresa Moore.  Prostatitis/epididymitis - per Dr. Tresa Moore  ARF - foley placed by Dr. Tresa Moore, per primary  Phillip Blackwell 08/14/2018, 12:53 PM

## 2018-08-14 NOTE — ED Notes (Signed)
Admitting MD at bedside.

## 2018-08-14 NOTE — ED Notes (Signed)
Dr. Marlowe Sax returned this RNs page regarding hgb 6.9, MD advised that she will place order for repeat CBC to be drawn a few hours from now, suspects low counts are r/t dilution from iv fluids

## 2018-08-14 NOTE — Progress Notes (Addendum)
[facility]  I have examined the patient, spoken with Dr Tresa Moore a couple of times about plan and have extensively reviewed the chart.   Today's Vitals   08/14/18 1300 08/14/18 1330 08/14/18 1400 08/14/18 1415  BP: (!) 127/58 (!) 104/48 118/60 110/67  Pulse: (!) 110 (!) 103 (!) 102 (!) 103  Resp: (!) 23 (!) 28 19 (!) 27  Temp:      TempSrc:      SpO2: 99% 99% 100% 100%  Weight:      Height:      PainSc: 0-No pain      Body mass index is 25.77 kg/m.   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 ATN in setting on infection and Lisinopril - renal ultrasound shows no obstruction- - Cr improved from yesterday - will start Bicarb infusion and will ask for a nephrology eval - cont foley cath for I and O    Serum total bilirubin elevated - ? Due to sepsis- CT of abd/pelvis w/o contrast show no abnormalities in liver or gallbladder, abdominal ultrasound negative as well - d/w GI- will follow    Hematochezia - due to rectal ulcer    Hyponatremia - see fluid orders above  HTN- tachycardia - resume Metoprolol at 1/2 home dose which may help his sinus tachycardia as well - BP will tolerate it - hold Lisinopril   Acute anemia - suspect dilutional in addition to intermittent bleeding from rectal ulcer - received 3 L NS in ED, multiple IV antibiotics and then continuous fluids - although T bili is elevated, it is mostly direct bili therefore less likely hemolysis - check anemia  panel    OSA (obstructive sleep apnea) - CPAP ordered  BPH - Flomax, Prazosin  Hiccoughs - has had them for 1 month- Thorazine ordered by his psychiatrist (for mood) about a month ago has not helped- d/c Thorazine - ? Due to severe reflux - will double Protonix  Anxiety/ depression - cont Cymbalta, Klonopin- d/c Thorazine  Debbe Odea, MD Pager: [email or web address]

## 2018-08-14 NOTE — Consult Note (Signed)
[facility]    NEPHROLOGY CONSULTATION NOTE  PATIENT ID:  Phillip Blackwell, DOB:  1961/04/09  HPI: The patient is a 58 y.o. year old male with a past medical history significant for anxiety, depression, gastroesophageal reflux disease, hypertension, CVA, and obstructive sleep apnea who presents to the hospital for evaluation of groin pain and swelling.  He been having discomfort and swelling for approximately 5 days, with progressive worsening.  He had associated chills at home.  He reported poor oral intake and loose stools with associated diarrhea for the past several days.  He did notice some blood streaking stool approximately 1 week ago.  He has a prior history of prostate cancer and is followed by urology at the [place].  On admission, he was noted to have a serum creatinine of over 7.  His baseline serum creatinine from 1 month ago was 1.  He was admitted for sepsis.  He has been receiving IV antibiotics.  Extensive imaging evaluation has revealed no evidence of Fournier's gangrene, but possible cystitis and epididymitis.  His bicarbonate worsened to 13 this afternoon.  His serum creatinine has improved into the sixes.  Renal consultation has been called for acute kidney injury and metabolic acidosis.   Past Medical History:  Diagnosis Date  . Anxiety   . Depression   . GERD (gastroesophageal reflux disease)   . Hypertension   . Rectal bleeding 07/07/2018  . Sleep apnea   . Stroke [facility])    2014    Past Surgical History:  Procedure Laterality Date  . BIOPSY  07/10/2018   Procedure: BIOPSY;  Surgeon: Otis Brace, MD;  Location: [facility];  Service: Gastroenterology;;  . Otho Darner SIGMOIDOSCOPY N/A 07/10/2018   Procedure: FLEXIBLE SIGMOIDOSCOPY;  Surgeon: Otis Brace, MD;  Location: [place];  Service: Gastroenterology;  Laterality: N/A;  Adult EGD Scope. Peds biopsy forceps.  Marland Kitchen HERNIA REPAIR      No family history on file.  Social History   Tobacco  Use  . Smoking status: Never Smoker  . Smokeless tobacco: Never Used  Substance Use Topics  . Alcohol use: No  . Drug use: No    REVIEW OF SYSTEMS: General: Positive fever, weakness, and fatigue  head:  no headaches Eyes:  no blurred vision ENT:  no sore throat Neck:  no masses CV:  no chest pain, no orthopnea Lungs:  no shortness of breath, no cough GI: Positive nausea, no diarrhea GU: Positive dysuria, Foley in place, positive groin pain Skin:  no rashes or lesions Neuro:  no focal numbness or weakness Psych:  no depression or anxiety    PHYSICAL EXAM:  Vitals:   08/14/18 1800 08/14/18 1803  BP: 130/61   Pulse: (!) 113   Resp: 19   Temp:  (!) 101.3 F (38.5 C)  SpO2: 98%    I/O last 3 completed shifts: In: 3043.1 [IV Piggyback:3043.1] Out: -    General:  AAOx3 mild distress, diaphoretic HEENT: MMM [place] AT anicteric sclera Neck:  No JVD, no adenopathy CV:  Heart RRR  Lungs:  L/S CTA bilaterally Abd:  abd SNT/ND with normal BS GU:  Bladder non-palpable, Foley in place, swelling of the scrotum and penis was noted. Extremities:  No LE edema. Skin:  No skin rash Psych:  normal mood and affect Neuro:  no focal deficits  MEDICATIONS:   Current Facility-Administered Medications:  .  acetaminophen (TYLENOL) tablet 650 mg, 650 mg, Oral, Q6H PRN, 650 mg at 08/14/18 1658 **OR** acetaminophen (TYLENOL)  suppository 650 mg, 650 mg, Rectal, Q6H PRN, Shela Leff, MD .  aspirin chewable tablet 81 mg, 81 mg, Oral, Daily, Shela Leff, MD, 81 mg at 08/14/18 1659 .  atorvastatin (LIPITOR) tablet 40 mg, 40 mg, Oral, Q supper, Shela Leff, MD, 40 mg at 08/14/18 1658 .  ceFEPIme (MAXIPIME) 1 g in sodium chloride 0.9 % 100 mL IVPB, 1 g, Intravenous, Q24H, Shela Leff, MD .  cholecalciferol (VITAMIN D3) tablet 2,000 Units, 2,000 Units, Oral, Daily, Shela Leff, MD, 2,000 Units at 08/14/18 1659 .  clonazePAM (KLONOPIN) tablet 1 mg, 1 mg, Oral, QHS  PRN, Shela Leff, MD .  DULoxetine (CYMBALTA) DR capsule 60 mg, 60 mg, Oral, Q supper, Shela Leff, MD, 60 mg at 08/14/18 1659 .  ferrous sulfate tablet 325 mg, 325 mg, Oral, Q breakfast, Shela Leff, MD .  metoprolol tartrate (LOPRESSOR) tablet 50 mg, 50 mg, Oral, BID, Rizwan, Saima, MD .  metroNIDAZOLE (FLAGYL) IVPB 500 mg, 500 mg, Intravenous, Q8H, Shela Leff, MD, Last Rate: 100 mL/hr at 08/14/18 1643, 500 mg at 08/14/18 1643 .  multivitamin with minerals tablet 1 tablet, 1 tablet, Oral, Daily, Shela Leff, MD, 1 tablet at 08/14/18 1658 .  ondansetron (ZOFRAN) injection 4 mg, 4 mg, Intravenous, Q6H PRN, Shela Leff, MD, 4 mg at 08/14/18 1653 .  pantoprazole (PROTONIX) EC tablet 40 mg, 40 mg, Oral, BID AC, Rizwan, Saima, MD, 40 mg at 08/14/18 1703 .  prazosin (MINIPRESS) capsule 2 mg, 2 mg, Oral, QHS, Rathore, Vasundhra, MD .  sodium bicarbonate 150 mEq in dextrose 5 % 1,000 mL infusion, , Intravenous, Continuous, Rizwan, Saima, MD, Last Rate: 125 mL/hr at 08/14/18 1703 .  tamsulosin (FLOMAX) capsule 0.4 mg, 0.4 mg, Oral, Daily PRN, Shela Leff, MD .  vancomycin variable dose per unstable renal function (pharmacist dosing), , Does not apply, See admin instructions, Shela Leff, MD  Current Outpatient Medications:  .  amLODipine (NORVASC) 10 MG tablet, Take 10 mg by mouth daily., Disp: , Rfl:  .  aspirin 81 MG chewable tablet, Chew 81 mg by mouth daily., Disp: , Rfl:  .  atorvastatin (LIPITOR) 40 MG tablet, Take 40 mg by mouth daily with supper. , Disp: , Rfl:  .  chlorproMAZINE (THORAZINE) 25 MG tablet, Take 25 mg by mouth at bedtime., Disp: , Rfl:  .  Cholecalciferol (VITAMIN D3) 2000 units TABS, Take 2,000 Units by mouth daily. , Disp: , Rfl:  .  clonazePAM (KLONOPIN) 1 MG tablet, Take 1 mg by mouth at bedtime. , Disp: , Rfl:  .  DULoxetine (CYMBALTA) 60 MG capsule, Take 60 mg by mouth daily with supper. , Disp: , Rfl:  .  ferrous  sulfate 325 (65 FE) MG tablet, Take 1 tablet (325 mg total) by mouth daily with breakfast., Disp: 30 tablet, Rfl: 0 .  LACTOBACILLUS PO, Take 1 tablet by mouth daily., Disp: , Rfl:  .  lisinopril (PRINIVIL,ZESTRIL) 40 MG tablet, Take 40 mg by mouth daily., Disp: , Rfl:  .  metoprolol tartrate (LOPRESSOR) 100 MG tablet, Take 100 mg by mouth 2 (two) times daily. , Disp: , Rfl:  .  Multiple Vitamins-Minerals (MULTIVITAMIN WITH MINERALS) tablet, Take 1 tablet by mouth daily., Disp: , Rfl:  .  omeprazole (PRILOSEC) 40 MG capsule, Take 40 mg by mouth daily. , Disp: , Rfl:  .  prazosin (MINIPRESS) 1 MG capsule, Take 2 mg by mouth at bedtime. , Disp: , Rfl:  .  tamsulosin (FLOMAX) 0.4 MG CAPS capsule, Take 0.4  mg by mouth daily as needed (frequent urination)., Disp: , Rfl:      LABS:  CBC Latest Ref Rng & Units 08/14/2018 08/14/2018 08/13/2018  WBC 4.0 - 10.5 K/uL 28.4(H) 31.8(H) 41.7(H)  Hemoglobin 13.0 - 17.0 g/dL 6.9(LL) 6.9(LL) 8.4(L)  Hematocrit 39.0 - 52.0 % 21.2(L) 21.2(L) 26.9(L)  Platelets 150 - 400 K/uL 262 259 284    CMP Latest Ref Rng & Units 08/14/2018 08/13/2018 07/11/2018  Glucose 70 - 99 mg/dL 151(H) 157(H) 99  BUN 6 - 20 mg/dL 94(H) 92(H) 6  Creatinine 0.61 - 1.24 mg/dL 6.38(H) 7.23(H) 1.05  Sodium 135 - 145 mmol/L 132(L) 130(L) 138  Potassium 3.5 - 5.1 mmol/L 4.4 5.0 3.2(L)  Chloride 98 - 111 mmol/L 106 98 102  CO2 22 - 32 mmol/L 13(L) 17(L) 23  Calcium 8.9 - 10.3 mg/dL 7.7(L) 9.0 8.6(L)  Total Protein 6.5 - 8.1 g/dL 6.1(L) 7.8 -  Total Bilirubin 0.3 - 1.2 mg/dL 5.0(H) 6.4(H) -  Alkaline Phos 38 - 126 U/L 87 118 -  AST 15 - 41 U/L 33 50(H) -  ALT 0 - 44 U/L 28 41 -    Lab Results  Component Value Date   CALCIUM 7.7 (L) 08/14/2018       Component Value Date/Time   COLORURINE AMBER (A) 08/13/2018 2010   APPEARANCEUR CLOUDY (A) 08/13/2018 2010   LABSPEC 1.014 08/13/2018 2010   [place] 5.0 08/13/2018 2010   [place] 08/13/2018 2010   [place] NEGATIVE 08/13/2018 2010    [place] NEGATIVE 08/13/2018 2010   [place] NEGATIVE 08/13/2018 2010   PROTEINUR 30 (A) 08/13/2018 2010   NITRITE NEGATIVE 08/13/2018 2010   LEUKOCYTESUR NEGATIVE 08/13/2018 2010   No results found for: PHART, PCO2ART, PO2ART, HCO3, TCO2, ACIDBASEDEF, O2SAT     Component Value Date/Time   IRON 19 (L) 07/09/2018 1019   TIBC 340 07/09/2018 1019   FERRITIN 635 (H) 08/14/2018 1605   IRONPCTSAT 6 (L) 07/09/2018 1019       ASSESSMENT/PLAN:     Problem List Items Addressed This Visit      Genitourinary   Acute renal failure (ARF) ([place])   RESOLVED: Epididymitis    Other Visit Diagnoses    Sepsis with acute renal failure and septic shock, due to unspecified organism, unspecified acute renal failure type ([place])    -  Primary   Relevant Medications   ceFEPIme (MAXIPIME) 2 g in sodium chloride 0.9 % 100 mL IVPB (Completed)   metroNIDAZOLE (FLAGYL) IVPB 500 mg   vancomycin (VANCOCIN) IVPB 1000 mg/200 mL premix (Completed)   ceFEPIme (MAXIPIME) 1 g in sodium chloride 0.9 % 100 mL IVPB (Start on 08/14/2018  8:00 PM)   vancomycin variable dose per unstable renal function (pharmacist dosing)      1.  Baseline serum creatinine 1.0.  2.  Acute kidney injury.  Likely secondary to ATN in the setting of prolonged prerenal azotemia and shock.  Improving urine output today.  Has had no low blood pressures today.  Serum creatinine has slightly improved to around 6.  We will attempt to manage acidosis with a bicarbonate drip.  3.  Anion gap metabolic acidosis.  Likely secondary to acute kidney injury and underlying infection.  Will continue IV fluids.  Bicarb added to IV fluids today.  Will continue.  With improving urine output, I suspect this will improve as well.  4.  Epididymitis versus acute prostatitis.  Continue broad-spectrum antibiotics for now with anaerobic coverage.    [employer], DO,  FACP

## 2018-08-14 NOTE — H&P (Addendum)
History and Physical    Phillip Blackwell GUR:[id number] DOB: [birth year] DOA: 08/13/2018  PCP: System, Pcp Not In Patient coming from: Home  Chief Complaint: Groin pain and swelling  HPI: Phillip Blackwell is a 58 y.o. male with medical history significant of anxiety, depression, GERD, hypertension, CVA, OSA presenting to the hospital for evaluation of groin pain and swelling.  Patient reports having pain and swelling in his genital area for the past 5 days; getting progressively worse.  He has been having chills at home.  Denies noticing any discharge from his penis.  No oliguria.  No abdominal pain.  He has been having nausea and vomiting.  Not tolerating much oral intake.  Having loose stools for the past few days.  Denies having any melena or hematochezia.  Reports noticing blood-streaked stool about a week ago.  States he has a history of prostate cancer and is seen by urology at the [place].  Review of Systems: As per HPI otherwise 10 point review of systems negative.  Past Medical History:  Diagnosis Date  . Anxiety   . Depression   . GERD (gastroesophageal reflux disease)   . Hypertension   . Rectal bleeding 07/07/2018  . Sleep apnea   . Stroke [facility])    2014    Past Surgical History:  Procedure Laterality Date  . BIOPSY  07/10/2018   Procedure: BIOPSY;  Surgeon: Otis Brace, MD;  Location: [place];  Service: Gastroenterology;;  . Otho Darner SIGMOIDOSCOPY N/A 07/10/2018   Procedure: FLEXIBLE SIGMOIDOSCOPY;  Surgeon: Otis Brace, MD;  Location: [place];  Service: Gastroenterology;  Laterality: N/A;  Adult EGD Scope. Peds biopsy forceps.  Marland Kitchen HERNIA REPAIR       reports that he has never smoked. He has never used smokeless tobacco. He reports that he does not drink alcohol or use drugs.  No Known Allergies  No family history on file.  Prior to Admission medications   Medication Sig Start Date End Date Taking? Authorizing Provider  amLODipine (NORVASC) 10 MG  tablet Take 10 mg by mouth daily.   Yes [provider]  aspirin 81 MG chewable tablet Chew 81 mg by mouth daily.   Yes [provider]  atorvastatin (LIPITOR) 40 MG tablet Take 40 mg by mouth daily with supper.    Yes [provider]  chlorproMAZINE (THORAZINE) 25 MG tablet Take 25 mg by mouth at bedtime.   Yes [provider]  Cholecalciferol (VITAMIN D3) 2000 units TABS Take 2,000 Units by mouth daily.    Yes [provider]  clonazePAM (KLONOPIN) 1 MG tablet Take 1 mg by mouth at bedtime.    Yes [provider]  DULoxetine (CYMBALTA) 60 MG capsule Take 60 mg by mouth daily with supper.    Yes [provider]  ferrous sulfate 325 (65 FE) MG tablet Take 1 tablet (325 mg total) by mouth daily with breakfast. 07/11/18  Yes Thurnell Lose, MD  LACTOBACILLUS PO Take 1 tablet by mouth daily.   Yes [provider]  lisinopril (PRINIVIL,ZESTRIL) 40 MG tablet Take 40 mg by mouth daily.   Yes [provider]  metoprolol tartrate (LOPRESSOR) 100 MG tablet Take 100 mg by mouth 2 (two) times daily.    Yes [provider]  Multiple Vitamins-Minerals (MULTIVITAMIN WITH MINERALS) tablet Take 1 tablet by mouth daily.   Yes [provider]  omeprazole (PRILOSEC) 40 MG capsule Take 40 mg by mouth daily.    Yes [provider]  prazosin (MINIPRESS) 1 MG capsule Take 2 mg by mouth at bedtime.    Yes [provider]  tamsulosin (FLOMAX) 0.4 MG CAPS capsule Take 0.4 mg by mouth daily as needed (frequent urination).   Yes [provider]    Physical Exam: Vitals:   08/14/18 0145 08/14/18 0200 08/14/18 0215 08/14/18 0230  BP: (!) 110/54 111/61 (!) 111/58 122/66  Pulse: 96 96 93 100  Resp: (!) 26 (!) 31 20 17   Temp:      TempSrc:      SpO2: 99% 97% 98% 100%  Weight:      Height:        Physical Exam  Constitutional: He is oriented to person, place, and time. No distress.  HENT:    Head: Normocephalic.  Mouth/Throat: Oropharynx is clear and moist.  Eyes: Scleral icterus is present.  Neck: Neck supple.  Cardiovascular: Regular rhythm and intact distal pulses.  Slightly tachycardic  Pulmonary/Chest: Effort normal and breath sounds normal. No respiratory distress. He has no wheezes. He has no rales.  Abdominal: Soft. Bowel sounds are normal. He exhibits no distension. There is no abdominal tenderness. There is no guarding.  Genitourinary:    Genitourinary Comments: Mons pubis, penis, and scrotum edematous.   Musculoskeletal:        General: No edema.  Neurological: He is alert and oriented to person, place, and time.  Skin: Skin is warm. He is diaphoretic.     Labs on Admission: I have personally reviewed following labs and imaging studies  CBC: Recent Labs  Lab 08/13/18 2000  WBC 41.7*  NEUTROABS 37.4*  HGB 8.4*  HCT 26.9*  MCV 93.4  PLT 149   Basic Metabolic Panel: Recent Labs  Lab 08/13/18 2000  NA 130*  K 5.0  CL 98  CO2 17*  GLUCOSE 157*  BUN 92*  CREATININE 7.23*  CALCIUM 9.0   GFR: Estimated Creatinine Clearance: 12.1 mL/min (A) (by C-G formula based on SCr of 7.23 mg/dL (H)). Liver Function Tests: Recent Labs  Lab 08/13/18 2000  AST 50*  ALT 41  ALKPHOS 118  BILITOT 6.4*  PROT 7.8  ALBUMIN 2.0*   Recent Labs  Lab 08/13/18 2047  LIPASE 33   No results for input(s): AMMONIA in the last 168 hours. Coagulation Profile: Recent Labs  Lab 08/13/18 2000  INR 1.18   Cardiac Enzymes: No results for input(s): CKTOTAL, CKMB, CKMBINDEX, TROPONINI in the last 168 hours. BNP (last 3 results) No results for input(s): PROBNP in the last 8760 hours. HbA1C: No results for input(s): HGBA1C in the last 72 hours. CBG: Recent Labs  Lab 08/13/18 2308  GLUCAP 156*   Lipid Profile: No results for input(s): CHOL, HDL, LDLCALC, TRIG, CHOLHDL, LDLDIRECT in the last 72 hours. Thyroid Function Tests: No results for input(s): TSH,  T4TOTAL, FREET4, T3FREE, THYROIDAB in the last 72 hours. Anemia Panel: No results for input(s): VITAMINB12, FOLATE, FERRITIN, TIBC, IRON, RETICCTPCT in the last 72 hours. Urine analysis:    Component Value Date/Time   COLORURINE AMBER (A) 08/13/2018 2010   APPEARANCEUR CLOUDY (A) 08/13/2018 2010   LABSPEC 1.014 08/13/2018 2010   [place] 5.0 08/13/2018 2010   GLUCOSEU NEGATIVE 08/13/2018 2010   HGBUR NEGATIVE 08/13/2018 2010   [place] NEGATIVE 08/13/2018 2010   [place] NEGATIVE 08/13/2018 2010   PROTEINUR 30 (A) 08/13/2018 2010   NITRITE NEGATIVE 08/13/2018 2010   LEUKOCYTESUR NEGATIVE 08/13/2018 2010    Radiological Exams on Admission: Ct Abdomen Pelvis Wo Contrast  Result Date: 08/13/2018 CLINICAL DATA:  Groin swelling EXAM: CT ABDOMEN AND PELVIS WITHOUT CONTRAST TECHNIQUE: Multidetector CT imaging of the abdomen and pelvis was performed following the standard protocol without IV contrast. COMPARISON:  MRI 07/10/2018 FINDINGS: Lower chest: Lung bases demonstrate no acute consolidation or effusion. The heart size is normal. Small hiatal hernia. Hepatobiliary: No focal liver abnormality is seen. No gallstones, gallbladder wall thickening, or biliary dilatation. Pancreas: Unremarkable. No pancreatic ductal dilatation or surrounding inflammatory changes. Spleen: Normal in size without focal abnormality. Adrenals/Urinary Tract: Adrenal glands are within normal limits. No hydronephrosis. Thick-walled urinary bladder Stomach/Bowel: Stomach is nonenlarged. No dilated small bowel. Extensive gas collection Between the rectum and prostate. Vascular/Lymphatic: Mild aortic atherosclerosis. No aneurysm. No significantly enlarged lymph nodes. Reproductive: Metallic densities along the posterior aspect of the prostate. Large gas collections around the prostate gland. Moderate edema of the scrotum and around the penis. Other: No free air. Fat in the left inguinal canal. Edema within the pubic soft  tissues. Gas and fluid collection extending from the rectal area and along both sides of the prostate gland. Musculoskeletal: No acute or suspicious abnormality. IMPRESSION: 1. Moderate edema and mild inflammatory changes involving the scrotum and soft tissues about the penis, but without soft tissue emphysema. Mild edema within the pubic soft tissues. 2. Abnormal gas and fluid collection between the rectum and prostate gland and extending along the right and left aspects of the prostate gland, felt to correspond to previously demonstrated rectal ulcer and sinus tracks. 3. Thick-walled appearance of the urinary bladder, possible cystitis Electronically Signed   By: Donavan Foil M.D.   On: 08/13/2018 23:19   Dg Chest 2 View  Result Date: 08/13/2018 CLINICAL DATA:  Acute onset of genital swelling. Fever. EXAM: CHEST - 2 VIEW COMPARISON:  Chest radiograph performed 07/07/2018 FINDINGS: The lungs are well-aerated. Minimal left basilar atelectasis is noted. There is no evidence of pleural effusion or pneumothorax. The heart is normal in size; the mediastinal contour is within normal limits. No acute osseous abnormalities are seen. IMPRESSION: Minimal left basilar atelectasis noted. Lungs otherwise clear. Electronically Signed   By: Garald Balding M.D.   On: 08/13/2018 22:16   US Scrotum W/doppler  Result Date: 08/13/2018 CLINICAL DATA:  Initial evaluation for acute testicular pain, swelling. EXAM: SCROTAL ULTRASOUND DOPPLER ULTRASOUND OF THE TESTICLES TECHNIQUE: Complete ultrasound examination of the testicles, epididymis, and other scrotal structures was performed. Color and spectral Doppler ultrasound were also utilized to evaluate blood flow to the testicles. COMPARISON:  None. FINDINGS: Right testicle Measurements: 3.5 x 2.5 x 1.9 cm. No mass lesion. Testicular microlithiasis noted. Left testicle Measurements: 3.9 x 2.2 x 2.3 cm. No mass lesion. Testicular microlithiasis noted. Right epididymis:  Normal in  size and appearance. Left epididymis: Diffusely increased vascularity seen within the left epididymis, suggesting acute epididymitis. Hydrocele:  Bilateral hydroceles noted. Varicocele:  Bilateral varicoceles noted. Pulsed Doppler interrogation of both testes demonstrates normal low resistance arterial and venous waveforms bilaterally. Incidental note made of a probable small fat containing right inguinal hernia. IMPRESSION: 1. Increased vascularity within the left epididymis, suggesting acute epididymitis. No evidence for testicular torsion. 2. Small moderate bilateral hydroceles, likely reactive. 3. Small bilateral varicoceles. 4. Probable small fat containing right inguinal hernia. 5. Testicular microlithiasis. Current literature suggests that testicular microlithiasis is not a significant independent risk factor for development of testicular carcinoma, and that follow up imaging is not warranted in the absence of other risk factors. Monthly testicular self-examination and annual physical exams  are considered appropriate surveillance. If patient has other risk factors for testicular carcinoma, then referral to Urology should be considered. (Reference: DeCastro, et al.: A 5-Year Follow up Study of Asymptomatic Men with Testicular Microlithiasis. J Urol 2008; [id number].) Electronically Signed   By: Jeannine Boga M.D.   On: 08/13/2018 22:48    EKG: Independently reviewed.  Sinus tachycardia (heart rate 102).  Assessment/Plan Principal Problem:   Epididymitis Active Problems:   Severe sepsis (HCC)   Acute renal failure (ARF) (HCC)   Serum total bilirubin elevated   Hematochezia   Hyponatremia   Normal anion gap metabolic acidosis   Testicular microlithiasis   OSA (obstructive sleep apnea)   Severe sepsis secondary to epididymitis -Temperature 101.7.  Tachycardic and tachypneic.  Blood pressure soft in the ED, improved with IV fluid.  White count 43.1 with neutrophilic predominance.   Lactic acid 3.8, improved to 1.9 with fluid resuscitation.  -Scrotal ultrasound with evidence of acute left epididymitis.  No evidence of testicular torsion.  Small to moderate bilateral hydroceles, likely reactive.  Small bilateral varicoceles.   -CT of abdomen and pelvis showing moderate edema and mild inflammatory changes involving the scrotum and soft tissues about the penis, but without soft tissue emphysema.  Mild edema within the pubic soft tissues.  There is abnormal gas and fluid collection between the rectum and prostate gland and extending along the right and left aspects of the prostate gland, felt to correspond to previously demonstrated rectal ulcer and sinus tracts. Thick-walled appearance of the urinary bladder, possible cystitis. UA not suggestive of infection. -ED provider discussed the case with Dr. Redmond Pulling from general surgery, no recommendations at this time. -Patient does not have diabetes.  No signs of Fournier's gangrene.  -Continue broad-spectrum antibiotics (vancomycin, cefepime, Flagyl) -Continue IV fluid -Patient is not complaining of significant pain at this time.  Tylenol PRN. -Blood culture x2 pending -Urine culture pending -Continue to monitor white blood cell count -Consult urology in the morning  Acute renal failure BUN 92.  Serum creatinine 7.2, was 1.0 a month ago.  GFR 9.  UA with evidence of proteinuria.  CT abdomen pelvis without evidence of hydronephrosis.   -Continue IV fluid hydration -Avoid nephrotoxic agents/contrast -Continue to monitor BMP -Check urine sodium -Monitor urine output -Renal ultrasound -Consult nephrology in the morning  Addendum: Bicarb 13 on repeat labs. Give bicarb supplement.   Elevated T bili T bili 6.4.  AST borderline elevated at 50, remainder of LFTs normal.  No gallstones, gallbladder wall thickening, or biliary dilatation seen on CT. -Right upper quadrant ultrasound -Repeat hepatic function panel in a.m. -Zofran PRN  nausea  History of hematochezia Patient denies having any melena or hematochezia at present.  Reports noticing streaks of blood mixed with his stool about a week ago.  Hemoglobin 8.4, was 9.8 a month ago.  FOBT positive.  Per ED provider, no frank bleeding noted on rectal exam.  Flexible sigmoidoscopy done in November 2019 showing a solitary deep ulcer in the distal rectum with stigmata of recent bleeding.  Biopsy without adenomatous change or malignancy. -Continue to monitor CBC  Addendum: Hgb 6.9 this morning. Possibly hemodilutional as WBC and platelet count also down. Patient has not had any bloody bowel movements since he has been in the hospital. Will hold anticoagulation for DVT ppx (not received any doses yet). Repeat CBC in 4 hrs.   Mild hyponatremia Corrected sodium 131.  Mild hyponatremia likely secondary to decreased p.o. intake. -Continue IV fluid -Continue to monitor  BMP  Normal anion gap metabolic acidosis Bicarb 17.  Anion gap 15.  Possibly related to acute renal failure.  Patient reports having loose stools.  No episodes of diarrhea since he has been in the ED. -Continue IV fluid -Continue to monitor BMP  Testicular microlithiasis Seen on scrotal ultrasound.  Patient is followed by urology at the [facility] and will need follow-up for continued surveillance.  OSA -CPAP at night  DVT prophylaxis: SCDs Code Status: Patient wishes to be full code. Family Communication: Wife at bedside Disposition Plan: Anticipate discharge after clinical improvement. Consults called: None Admission status: It is my clinical opinion that admission to INPATIENT is reasonable and necessary in this 58 y.o. male . presenting with symptoms of fever, tachycardia, tachypnea scrotal swelling, concerning for severe sepsis secondary to acute epididymitis . with pertinent positives on physical exam including: Swelling of the mons pubis, penis, and scrotum. Marland Kitchen and pertinent positives on radiographic and  laboratory data including: Imaging with evidence of acute epididymitis.  Labs with evidence of significant leukocytosis, lactic acidosis, acute renal failure, and elevated T bili. . Workup and treatment include IV broad-spectrum antibiotics and IV fluid hydration.  Continue to monitor renal function and clinical progress.  Given the aforementioned, the predictability of an adverse outcome is felt to be significant. I expect that the patient will require at least 2 midnights in the hospital to treat this condition.    Shela Leff MD [facility] Pager [phone]  If 7PM-7AM, please contact night-coverage [email or web address] Password [facility]  08/14/2018, 2:43 AM

## 2018-08-14 NOTE — ED Notes (Signed)
ORDERED DIET FOR PT

## 2018-08-14 NOTE — Discharge Instructions (Addendum)
Follow with Primary MD in 7 days   Get CBC, CMP checked  by Primary MD  in 5-7 days    Activity: As tolerated with Full fall precautions use walker/cane & assistance as needed  Disposition Home    Diet: Heart Healthy Low Carb, check CBGs QAC-HS   Special Instructions: If you have smoked or chewed Tobacco  in the last 2 yrs please stop smoking, stop any regular Alcohol  and or any Recreational drug use.  On your next visit with your primary care physician please Get Medicines reviewed and adjusted.  Please request your Prim.MD to go over all Hospital Tests and Procedure/Radiological results at the follow up, please get all Hospital records sent to your Prim MD by signing hospital release before you go home.  If you experience worsening of your admission symptoms, develop shortness of breath, life threatening emergency, suicidal or homicidal thoughts you must seek medical attention immediately by calling 911 or calling your MD immediately  if symptoms less severe.  You Must read complete instructions/literature along with all the possible adverse reactions/side effects for all the Medicines you take and that have been prescribed to you. Take any new Medicines after you have completely understood and accpet all the possible adverse reactions/side effects.          Colostomy Home Guide, Adult  Colostomy surgery is done to create an opening in the front of the abdomen for stool (feces) to leave the body through an ostomy (stoma). Part of the large intestine is attached to the stoma. A bag, also called a pouch, is fitted over the stoma. Stool and gas will collect in the bag. After surgery, you will need to empty and change your colostomy bag as needed. You will also need to care for your stoma. How to care for the stoma Your stoma should look pink, red, and moist, like the inside of your cheek. Soon after surgery, the stoma may be swollen, but this swelling will go away within 6 weeks. To  care for the stoma:  Keep the skin around the stoma clean and dry.  Use a clean, soft washcloth to gently wash the stoma and the skin around it. Clean using a circular motion, and wipe away from the stoma opening, not toward it. ? Use warm water and only use cleansers recommended by your health care provider. ? Rinse the stoma area with plain water. ? Dry the area around the stoma well.  Use stoma powder or ointment on your skin only as told by your health care provider. Do not use any other powders, gels, wipes, or creams on the skin around the stoma.  Check the stoma area every day for signs of infection. Check for: ? New or worsening redness, swelling, or pain. ? New or increased fluid or blood. ? Pus or warmth.  Measure the stoma opening regularly and record the size. Watch for changes. (It is normal for the stoma to get smaller as swelling goes away.) Share this information with your health care provider. How to empty the colostomy bag  Empty your bag at bedtime and whenever it is one-third to one-half full. Do not let the bag get more than half-full with stool or gas. The bag could leak if it gets too full. Some colostomy bags have a built-in gas release valve that releases gas often throughout the day. Follow these basic steps: 1. Wash your hands with soap and water. 2. Sit far back on the toilet seat. 3.  Put several pieces of toilet paper into the toilet water. This will prevent splashing as you empty stool into the toilet. 4. Remove the clip or the hook-and-loop fastener from the tail end of the bag. 5. Unroll the tail, then empty the stool into the toilet. 6. Clean the tail with toilet paper or a moist towelette. 7. Reroll the tail, and close it with the clip or the hook-and-loop fastener. 8. Wash your hands again. How to change the colostomy bag Change your bag every 3-4 days or as often as told by your health care provider. Also change the bag if it is leaking or separating  from the skin, or if your skin around the stoma looks or feels irritated. Irritated skin may be a sign that the bag is leaking. Always have colostomy supplies with you, and follow these basic steps: 1. Wash your hands with soap and water. Have paper towels or tissues nearby to clean any discharge. 2. Remove the old bag and skin barrier. Use your fingers or a warm cloth to gently push the skin away from the barrier. 3. Clean the stoma area with water or with mild soap and water, as directed. Use water to rinse away any soap. 4. Dry the skin. You may use the cool setting on a hair dryer to do this. 5. Use a tracing pattern (template) to cut the skin barrier to the size needed. 6. If you are using a two-piece bag, attach the bag and the skin barrier to each other. Add the barrier ring, if you use one. 7. If directed, apply stoma powder or skin barrier gel to the skin. 8. Warm the skin barrier with your hands, or blow with a hair dryer for 5-10 seconds. 9. Remove the paper from the adhesive strip of the skin barrier. 10. Press the adhesive strip onto the skin around the stoma. 11. Gently rub the skin barrier onto the skin. This creates heat that helps the barrier to stick. 12. Apply stoma tape to the edges of the skin barrier, if desired. 28. Wash your hands again. General recommendations  Avoid wearing tight clothes or having anything press directly on your stoma or bag. Change your clothing whenever it is soiled or damp.  You may shower or bathe with the bag on or off. Do not use harsh or oily soaps or lotions. Dry the skin and bag after bathing.  Store all supplies in a cool, dry place. Do not leave supplies in extreme heat because some parts can melt or not stick as well.  Whenever you leave home, take extra clothing and an extra skin barrier and bag with you.  If your bag gets wet, you can dry it with a hair dryer on the cool setting.  To prevent odor, you may put drops of ostomy  deodorizer in the bag.  If recommended by your health care provider, put ostomy lubricant inside the bag. This helps stool to slide out of the bag more easily and completely. Contact a health care provider if:  You have new or worsening redness, swelling, or pain around your stoma.  You have new or increased fluid or blood coming from your stoma.  Your stoma feels warm to the touch.  You have pus coming from your stoma.  Your stoma extends in or out farther than normal.  You need to change your bag every day.  You have a fever. Get help right away if:  Your stool is bloody.  You have nausea  or you vomit.  You have trouble breathing. Summary  Measure your stoma opening regularly and record the size. Watch for changes.  Empty your bag at bedtime and whenever it is one-third to one-half full. Do not let the bag get more than half-full with stool or gas.  Change your bag every 3-4 days or as often as told by your health care provider.  Whenever you leave home, take extra clothing and an extra skin barrier and bag with you. This information is not intended to replace advice given to you by your health care provider. Make sure you discuss any questions you have with your health care provider. Document Released: 07/31/2003 Document Revised: 04/02/2018 Document Reviewed: 01/21/2017 Elsevier Interactive Patient Education  [employer].

## 2018-08-14 NOTE — ED Notes (Signed)
Patient transported to Ultrasound 

## 2018-08-15 LAB — COMPREHENSIVE METABOLIC PANEL
ALT: 26 U/L (ref 0–44)
AST: 35 U/L (ref 15–41)
Albumin: 1.2 g/dL — ABNORMAL LOW (ref 3.5–5.0)
Alkaline Phosphatase: 128 U/L — ABNORMAL HIGH (ref 38–126)
Anion gap: 9 (ref 5–15)
BUN: 93 mg/dL — ABNORMAL HIGH (ref 6–20)
CO2: 20 mmol/L — AB (ref 22–32)
Calcium: 7.8 mg/dL — ABNORMAL LOW (ref 8.9–10.3)
Chloride: 105 mmol/L (ref 98–111)
Creatinine, Ser: 4.8 mg/dL — ABNORMAL HIGH (ref 0.61–1.24)
GFR calc Af Amer: 14 mL/min — ABNORMAL LOW (ref >60)
GFR calc non Af Amer: 12 mL/min — ABNORMAL LOW (ref >60)
Glucose, Bld: 145 mg/dL — ABNORMAL HIGH (ref 70–99)
Potassium: 3.6 mmol/L (ref 3.5–5.1)
SODIUM: 134 mmol/L — AB (ref 135–145)
Total Bilirubin: 3.7 mg/dL — ABNORMAL HIGH (ref 0.3–1.2)
Total Protein: 5.7 g/dL — ABNORMAL LOW (ref 6.5–8.1)

## 2018-08-15 LAB — CBC
HCT: 16.7 % — ABNORMAL LOW (ref 39.0–52.0)
HCT: 24 % — ABNORMAL LOW (ref 39.0–52.0)
HEMOGLOBIN: 5.6 g/dL — AB (ref 13.0–17.0)
HEMOGLOBIN: 8 g/dL — AB (ref 13.0–17.0)
MCH: 29.5 pg (ref 26.0–34.0)
MCH: 28.8 pg (ref 26.0–34.0)
MCHC: 33.5 g/dL (ref 30.0–36.0)
MCHC: 33.3 g/dL (ref 30.0–36.0)
MCV: 87.9 fL (ref 80.0–100.0)
MCV: 86.3 fL (ref 80.0–100.0)
Platelets: 282 10*3/uL (ref 150–400)
Platelets: 310 10*3/uL (ref 150–400)
RBC: 1.9 MIL/uL — ABNORMAL LOW (ref 4.22–5.81)
RBC: 2.78 MIL/uL — ABNORMAL LOW (ref 4.22–5.81)
RDW: 16.2 % — ABNORMAL HIGH (ref 11.5–15.5)
RDW: 15.6 % — ABNORMAL HIGH (ref 11.5–15.5)
WBC: 21.5 10*3/uL — ABNORMAL HIGH (ref 4.0–10.5)
WBC: 17.4 10*3/uL — ABNORMAL HIGH (ref 4.0–10.5)
nRBC: 0 % (ref 0.0–0.2)
nRBC: 0 % (ref 0.0–0.2)

## 2018-08-15 LAB — VITAMIN B12: Vitamin B-12: 505 pg/mL (ref 180–914)

## 2018-08-15 LAB — IRON AND TIBC
Iron: 10 ug/dL — ABNORMAL LOW (ref 45–182)
Saturation Ratios: 5 % — ABNORMAL LOW (ref 17.9–39.5)
TIBC: 195 ug/dL — ABNORMAL LOW (ref 250–450)
UIBC: 185 ug/dL

## 2018-08-15 LAB — MRSA PCR SCREENING: MRSA by PCR: NEGATIVE

## 2018-08-15 LAB — PREPARE RBC (CROSSMATCH)

## 2018-08-15 LAB — URINE CULTURE: Culture: NO GROWTH

## 2018-08-15 LAB — VANCOMYCIN, RANDOM: Vancomycin Rm: 9

## 2018-08-15 LAB — FOLATE: Folate: 8.1 ng/mL (ref >5.9)

## 2018-08-15 MED ORDER — VANCOMYCIN HCL 10 G IV SOLR
1250.0000 mg | Freq: Once | INTRAVENOUS | Status: AC
  Administered 2018-08-15: 1250 mg via INTRAVENOUS
  Filled 2018-08-15: qty 1250

## 2018-08-15 MED ORDER — SODIUM CHLORIDE 0.9% IV SOLUTION
Freq: Once | INTRAVENOUS | Status: AC
  Administered 2018-08-15: 08:00:00 via INTRAVENOUS

## 2018-08-15 MED ORDER — CALCIUM POLYCARBOPHIL 625 MG PO TABS
625.0000 mg | ORAL_TABLET | Freq: Two times a day (BID) | ORAL | Status: DC
  Administered 2018-08-15 – 2018-09-03 (×38): 625 mg via ORAL
  Filled 2018-08-15 (×40): qty 1

## 2018-08-15 MED ORDER — FUROSEMIDE 10 MG/ML IJ SOLN
INTRAMUSCULAR | Status: AC
  Administered 2018-08-15: 20 mg
  Filled 2018-08-15: qty 2

## 2018-08-15 MED ORDER — SODIUM CHLORIDE 0.9% IV SOLUTION
Freq: Once | INTRAVENOUS | Status: AC
  Administered 2018-08-18: 18:00:00 via INTRAVENOUS

## 2018-08-15 MED ORDER — FUROSEMIDE 10 MG/ML IJ SOLN: 20.0000 mg | Freq: Once | INTRAMUSCULAR | Status: DC

## 2018-08-15 MED ORDER — FUROSEMIDE 10 MG/ML IJ SOLN
20.0000 mg | Freq: Once | INTRAMUSCULAR | Status: DC
  Administered 2018-08-15: 20 mg via INTRAVENOUS

## 2018-08-15 NOTE — Progress Notes (Signed)
Patient has BBB at 1901 notified on call provider and given scheduled medicines. Will continue monitor the patient.

## 2018-08-15 NOTE — Progress Notes (Signed)
[facility]    NEPHROLOGY PROGRESS NOTE  SUBJECTIVE: Continues to have pain but is feeling somewhat better.  Denies chest pain, shortness of breath, nausea, vomiting, skin rash, or myalgias.  All other review of systems are negative.   OBJECTIVE:  Vitals:   08/15/18 1332 08/15/18 1612  BP: 133/63 (!) 148/64  Pulse: 88 91  Resp: 19 (!) 21  Temp: 98.4 F (36.9 C) 99.4 F (37.4 C)  SpO2:     I/O last 3 completed shifts: In: 5063.1 [P.O.:120; I.V.:1500; IV Piggyback:3443.1] Out: -    General:  AAOx3 no distress, diaphoretic HEENT: MMM [place] AT anicteric sclera Neck:  No JVD, no adenopathy CV:  Heart RRR  Lungs:  L/S CTA bilaterally Abd:  abd SNT/ND with normal BS GU:  Bladder non-palpable, Foley in place, swelling of the scrotum and penis was noted. Extremities:  No LE edema. Skin:  No skin rash Psych:  normal mood and affect Neuro:  no focal deficits  MEDICATIONS:   Current Facility-Administered Medications:  .  0.9 %  sodium chloride infusion (Manually program via Guardrails IV Fluids), , Intravenous, Once, [place], Scarlette Shorts T, NP .  acetaminophen (TYLENOL) tablet 650 mg, 650 mg, Oral, Q6H PRN, 650 mg at 08/15/18 0733 **OR** acetaminophen (TYLENOL) suppository 650 mg, 650 mg, Rectal, Q6H PRN, Shela Leff, MD .  atorvastatin (LIPITOR) tablet 40 mg, 40 mg, Oral, Q supper, Shela Leff, MD, 40 mg at 08/15/18 1639 .  ceFEPIme (MAXIPIME) 1 g in sodium chloride 0.9 % 100 mL IVPB, 1 g, Intravenous, Q24H, Shela Leff, MD, Last Rate: 200 mL/hr at 08/15/18 0020, 1 g at 08/15/18 0020 .  cholecalciferol (VITAMIN D3) tablet 2,000 Units, 2,000 Units, Oral, Daily, Shela Leff, MD, 2,000 Units at 08/15/18 1037 .  clonazePAM (KLONOPIN) tablet 1 mg, 1 mg, Oral, QHS PRN, Shela Leff, MD .  ferrous sulfate tablet 325 mg, 325 mg, Oral, Q breakfast, Shela Leff, MD, 325 mg at 08/15/18 1039 .  metoprolol tartrate (LOPRESSOR) tablet 50 mg, 50 mg,  Oral, BID, Rizwan, Saima, MD, 50 mg at 08/15/18 1037 .  metroNIDAZOLE (FLAGYL) IVPB 500 mg, 500 mg, Intravenous, Q8H, Shela Leff, MD, Last Rate: 100 mL/hr at 08/15/18 1319, 500 mg at 08/15/18 1319 .  multivitamin with minerals tablet 1 tablet, 1 tablet, Oral, Daily, Shela Leff, MD, 1 tablet at 08/15/18 1037 .  ondansetron (ZOFRAN) injection 4 mg, 4 mg, Intravenous, Q6H PRN, Shela Leff, MD, 4 mg at 08/14/18 1653 .  pantoprazole (PROTONIX) EC tablet 40 mg, 40 mg, Oral, BID AC, Rizwan, Saima, MD, 40 mg at 08/15/18 1639 .  polycarbophil (FIBERCON) tablet 625 mg, 625 mg, Oral, BID, Leighton Ruff, MD, 706 mg at 08/15/18 1544 .  prazosin (MINIPRESS) capsule 2 mg, 2 mg, Oral, QHS, Shela Leff, MD, 2 mg at 08/14/18 2334 .  sodium bicarbonate 150 mEq in dextrose 5 % 1,000 mL infusion, , Intravenous, Continuous, Rizwan, Saima, MD, Last Rate: 125 mL/hr at 08/15/18 1700 .  tamsulosin (FLOMAX) capsule 0.4 mg, 0.4 mg, Oral, Daily PRN, Shela Leff, MD .  vancomycin variable dose per unstable renal function (pharmacist dosing), , Does not apply, See admin instructions, Shela Leff, MD     LABS:  CBC Latest Ref Rng & Units 08/15/2018 08/14/2018 08/14/2018  WBC 4.0 - 10.5 K/uL 21.5(H) 28.4(H) 31.8(H)  Hemoglobin 13.0 - 17.0 g/dL 5.6(LL) 6.9(LL) 6.9(LL)  Hematocrit 39.0 - 52.0 % 16.7(L) 21.2(L) 21.2(L)  Platelets 150 - 400 K/uL 282 262 259    CMP  Latest Ref Rng & Units 08/15/2018 08/14/2018 08/13/2018  Glucose 70 - 99 mg/dL 145(H) 151(H) 157(H)  BUN 6 - 20 mg/dL 93(H) 94(H) 92(H)  Creatinine 0.61 - 1.24 mg/dL 4.80(H) 6.38(H) 7.23(H)  Sodium 135 - 145 mmol/L 134(L) 132(L) 130(L)  Potassium 3.5 - 5.1 mmol/L 3.6 4.4 5.0  Chloride 98 - 111 mmol/L 105 106 98  CO2 22 - 32 mmol/L 20(L) 13(L) 17(L)  Calcium 8.9 - 10.3 mg/dL 7.8(L) 7.7(L) 9.0  Total Protein 6.5 - 8.1 g/dL 5.7(L) 6.1(L) 7.8  Total Bilirubin 0.3 - 1.2 mg/dL 3.7(H) 5.0(H) 6.4(H)  Alkaline Phos 38 - 126 U/L 128(H) 87  118  AST 15 - 41 U/L 35 33 50(H)  ALT 0 - 44 U/L 26 28 41    Lab Results  Component Value Date   CALCIUM 7.8 (L) 08/15/2018       Component Value Date/Time   COLORURINE AMBER (A) 08/13/2018 2010   APPEARANCEUR CLOUDY (A) 08/13/2018 2010   LABSPEC 1.014 08/13/2018 2010   [place] 5.0 08/13/2018 2010   [place] 08/13/2018 2010   [place] NEGATIVE 08/13/2018 2010   [place] NEGATIVE 08/13/2018 2010   [place] NEGATIVE 08/13/2018 2010   PROTEINUR 30 (A) 08/13/2018 2010   NITRITE NEGATIVE 08/13/2018 2010   LEUKOCYTESUR NEGATIVE 08/13/2018 2010   No results found for: PHART, PCO2ART, PO2ART, HCO3, TCO2, ACIDBASEDEF, O2SAT     Component Value Date/Time   IRON 10 (L) 08/15/2018 0238   TIBC 195 (L) 08/15/2018 0238   FERRITIN 635 (H) 08/14/2018 1605   IRONPCTSAT 5 (L) 08/15/2018 0238       ASSESSMENT/PLAN:     Problem List Items Addressed This Visit      Genitourinary   Acute renal failure (ARF) ([place])   RESOLVED: Epididymitis    Other Visit Diagnoses    Sepsis with acute renal failure and septic shock, due to unspecified organism, unspecified acute renal failure type ([place])    -  Primary   Relevant Medications   ceFEPIme (MAXIPIME) 2 g in sodium chloride 0.9 % 100 mL IVPB (Completed)   metroNIDAZOLE (FLAGYL) IVPB 500 mg   vancomycin (VANCOCIN) IVPB 1000 mg/200 mL premix (Completed)   ceFEPIme (MAXIPIME) 1 g in sodium chloride 0.9 % 100 mL IVPB   vancomycin variable dose per unstable renal function (pharmacist dosing)   vancomycin (VANCOCIN) 1,250 mg in sodium chloride 0.9 % 250 mL IVPB (Completed)      1.  Sepsis of GI/GU origin with prostatitis/epididymitis/colitis.  This is likely secondary from rectal perforation with seeding of the prostate and GU tract.  Continue antibiotics and treatment per urology.  Has been adequately fluid resuscitated.  Will discontinue IV fluids  2.  Acute kidney injury.  Likely secondary to sepsis and shock.  Renal function is  improving.  Will hold IV fluids for now.  Trend serum creatinine.  3.  Anion gap metabolic acidosis.  Improved.  Will discontinue bicarbonate drip  4.  Severe symptomatic anemia.  Is receiving packed red blood cells currently.    [place], DO, [place]

## 2018-08-15 NOTE — Progress Notes (Signed)
PROGRESS NOTE                                                                                                                                                                                                             Patient Demographics:    Phillip Knoblock, is a 58 y.o. male, DOB - 08-Nov-1960, VEL:[id number]  Admit date - 08/13/2018   Admitting Physician Shela Leff, MD  Outpatient Primary MD for the patient is System, Pcp Not In  LOS - 1   Chief Complaint  Patient presents with  . Groin Swelling  . Weakness  . Vomiting  . Diarrhea       Brief Narrative    58 y.o. male with medical history significant of anxiety, depression, GERD, hypertension, CVA, OSA presenting to the hospital for evaluation of groin pain and swelling , fever, blood in stool, work-up was significant for sepsis, acute blood loss anemia, is admitted for further work-up.   Subjective:    Phillip Blackwell today enlarged weakness, fatigue, hiccups, 1.6    Assessment  & Plan :    Principal Problem:   Severe sepsis ([place]) Active Problems:   Acute renal failure (ARF) ([facility])   Serum total bilirubin elevated   Hematochezia   Hyponatremia   Normal anion gap metabolic acidosis   Testicular microlithiasis   OSA (obstructive sleep apnea)    Severe sepsis -Sepsis criteria met on admission, leukocytosis of 31.8, lactic acid 1.9, febrile, tachycardic and tachypneic, worsening renal function  -Sepsis stemming from rectal ulcer/colon microperforation, seeding prostate and GU tract, causing epididymitis/prostatitis, and colitis. -Treated with broad-spectrum antibiotic including IV vancomycin and cefepime, so far blood cultures and urine cultures with no growth -Continue with IV fluids -Leukocytosis trending down  Rectal Ulcer with suspected perforation -Patient with known diagnosis of rectal ulcer with pruritic bleeding status post radiation 2018, had a flexible sigmoidoscopy  with biopsy in November 2019, negative for malignancy, current CT with evidence of gas in the perirectal area, periprostatic space, this is likely microperforation. -GI input greatly appreciated, continue with IV antibiotics, per general surgery may require colostomy to divert the fecal stream at some point, none emergently.  Acute renal failure with metabolic acidosis - BUN 92.  Serum creatinine 7.2 on admission, was 1.0 a month ago.   -Likely in the setting of ATN from severe sepsis,  with contaminant use of lisinopril -Renal ultrasound with no obstruction -Nephrology input appreciated, currently on bicarb drip  Hyponatremia -Mild, due to dehydration, improving with IV fluids  Hyperbilirubinemia -Likely due to sepsis- CT of abd/pelvis w/o contrast show no abnormalities in liver or gallbladder, abdominal ultrasound negative as well -Continue to follow  HTN- tachycardia - resume Metoprolol at 1/2 home dose which may help his sinus tachycardia as well - BP will tolerate it - hold Lisinopril   Acute blood loss anemia -globin 6.9 on admission, dropped to 5.6 today, most likely due to delusional factor, but this is acute blood loss anemia, from rectal ulcer bleed, I will transfuse 2 units PRBC, and monitor H&H closely.  OSA (obstructive sleep apnea) - CPAP ordered  BPH - Flomax, Prazosin  Hiccups - has had them for 1 month- Thorazine ordered by his psychiatrist (for mood) about a month ago has not helped- d/c Thorazine - ? Due to severe reflux - will double Protonix  Anxiety/ depression - cont Cymbalta, Klonopin- d/c Thorazine    Code Status : full  Family Communication  : wife at bedside  Disposition Plan  : Pending further work up   Consults  :  Urology, renal, General surgery  Procedures  : 2 unitsPRBC transfusion 08/15/2018  DVT Prophylaxis  :  SCD  Lab Results  Component Value Date   PLT 282 08/15/2018    Antibiotics  :   Anti-infectives (From admission,  onward)   Start     Dose/Rate Route Frequency Ordered Stop   08/15/18 1030  vancomycin (VANCOCIN) 1,250 mg in sodium chloride 0.9 % 250 mL IVPB     1,250 mg 166.7 mL/hr over 90 Minutes Intravenous  Once 08/15/18 0942 08/15/18 1444   08/14/18 2000  ceFEPIme (MAXIPIME) 1 g in sodium chloride 0.9 % 100 mL IVPB     1 g 200 mL/hr over 30 Minutes Intravenous Every 24 hours 08/13/18 2052     08/13/18 2052  vancomycin variable dose per unstable renal function (pharmacist dosing)      Does not apply See admin instructions 08/13/18 2052     08/13/18 2045  ceFEPIme (MAXIPIME) 2 g in sodium chloride 0.9 % 100 mL IVPB     2 g 200 mL/hr over 30 Minutes Intravenous  Once 08/13/18 2038 08/13/18 2141   08/13/18 2045  metroNIDAZOLE (FLAGYL) IVPB 500 mg     500 mg 100 mL/hr over 60 Minutes Intravenous Every 8 hours 08/13/18 2038     08/13/18 2045  vancomycin (VANCOCIN) IVPB 1000 mg/200 mL premix     1,000 mg 200 mL/hr over 60 Minutes Intravenous  Once 08/13/18 2038 08/13/18 2333        Objective:   Vitals:   08/15/18 1229 08/15/18 1300 08/15/18 1308 08/15/18 1332  BP: (!) 126/58   133/63  Pulse:  79 81 88  Resp:  _0 Temp: 98.3 F (36.8 C)  98.3 F (36.8 C) 98.4 F (36.9 C)  TempSrc: Oral   Oral  SpO2:  98%    Weight:      Height:        Wt Readings from Last 3 Encounters:  08/13/18 86.2 kg  07/10/18 97 kg  08/22/16 97.1 kg     Intake/Output Summary (Last 24 hours) at 08/15/2018 1506 Last data filed at 08/15/2018 1223 Gross per 24 hour  Intake 2466 ml  Output 1550 ml  Net 916 ml     Physical Exam  Awake Alert,  Oriented X 3, chronically ill-appearing, with hiccups  Symmetrical Chest wall movement, Good air movement bilaterally, CTAB Tachycardic,No Gallops,Rubs or new Murmurs, No Parasternal Heave +ve B.Sounds, Abd Soft, nontender,  No rebound - guarding or rigidity. No Cyanosis, Clubbing or edema, No new Rash or bruise      Data Review:    CBC Recent Labs  Lab  08/13/18 2000 08/14/18 0438 08/14/18 0916 08/15/18 0238  WBC 41.7* 31.8* 28.4* 21.5*  HGB 8.4* 6.9* 6.9* 5.6*  HCT 26.9* 21.2* 21.2* 16.7*  PLT 284 259 262 282  MCV 93.4 95.1 93.0 87.9  MCH 29.2 30.9 30.3 29.5  MCHC 31.2 32.5 32.5 33.5  RDW 16.0* 16.3* 16.3* 16.2*  LYMPHSABS 1.2  --   --   --   MONOABS 2.1*  --   --   --   EOSABS 0.1  --   --   --   BASOSABS 0.1  --   --   --     Chemistries  Recent Labs  Lab 08/13/18 2000 08/14/18 0438 08/15/18 0238  NA 130* 132* 134*  K 5.0 4.4 3.6  CL 98 106 105  CO2 17* 13* 20*  GLUCOSE 157* 151* 145*  BUN 92* 94* 93*  CREATININE 7.23* 6.38* 4.80*  CALCIUM 9.0 7.7* 7.8*  AST 50* 33 35  ALT 41 28 26  ALKPHOS 118 87 128*  BILITOT 6.4* 5.0* 3.7*   ------------------------------------------------------------------------------------------------------------------ No results for input(s): CHOL, HDL, LDLCALC, TRIG, CHOLHDL, LDLDIRECT in the last 72 hours.  No results found for: HGBA1C ------------------------------------------------------------------------------------------------------------------ No results for input(s): TSH, T4TOTAL, T3FREE, THYROIDAB in the last 72 hours.  Invalid input(s): FREET3 ------------------------------------------------------------------------------------------------------------------ Recent Labs    08/14/18 1605 08/15/18 0238  VITAMINB12  --  505  FOLATE  --  8.1  FERRITIN 635*  --   TIBC  --  195*  IRON  --  10*  RETICCTPCT 1.6  --     Coagulation profile Recent Labs  Lab 08/13/18 2000  INR 1.18    No results for input(s): DDIMER in the last 72 hours.  Cardiac Enzymes No results for input(s): CKMB, TROPONINI, MYOGLOBIN in the last 168 hours.  Invalid input(s): CK ------------------------------------------------------------------------------------------------------------------ No results found for: BNP  Inpatient Medications  Scheduled Meds: . sodium chloride   Intravenous Once    . aspirin  81 mg Oral Daily  . atorvastatin  40 mg Oral Q supper  . cholecalciferol  2,000 Units Oral Daily  . ferrous sulfate  325 mg Oral Q breakfast  . metoprolol tartrate  50 mg Oral BID  . multivitamin with minerals  1 tablet Oral Daily  . pantoprazole  40 mg Oral BID AC  . polycarbophil  625 mg Oral BID  . prazosin  2 mg Oral QHS  . vancomycin variable dose per unstable renal function (pharmacist dosing)   Does not apply See admin instructions   Continuous Infusions: . ceFEPime (MAXIPIME) IV 1 g (08/15/18 0020)  . metronidazole 500 mg (08/15/18 1319)  .  sodium bicarbonate  infusion 1000 mL 125 mL/hr at 08/15/18 0155   PRN Meds:.acetaminophen **OR** acetaminophen, clonazePAM, ondansetron (ZOFRAN) IV, tamsulosin  Micro Results Recent Results (from the past 240 hour(s))  Culture, blood (Routine x 2)     Status: None (Preliminary result)   Collection Time: 08/13/18  7:20 PM  Result Value Ref Range Status   Specimen Description BLOOD RIGHT ARM  Final   Special Requests   Final    BOTTLES DRAWN AEROBIC AND  ANAEROBIC Blood Culture adequate volume   Culture NO GROWTH 2 DAYS  Final   Report Status PENDING  Incomplete  Culture, blood (Routine x 2)     Status: None (Preliminary result)   Collection Time: 08/13/18  7:40 PM  Result Value Ref Range Status   Specimen Description BLOOD LEFT ARM  Final   Special Requests   Final    BOTTLES DRAWN AEROBIC ONLY Blood Culture results may not be optimal due to an excessive volume of blood received in culture bottles   Culture NO GROWTH 2 DAYS  Final   Report Status PENDING  Incomplete  Urine culture     Status: None   Collection Time: 08/13/18  8:29 PM  Result Value Ref Range Status   Specimen Description URINE, RANDOM  Final   Special Requests NONE  Final   Culture   Final    NO GROWTH Performed at [facility], [place] [address], [place], [place] [address]    Report Status 08/15/2018 FINAL  Final  MRSA PCR Screening     Status:  None   Collection Time: 08/15/18 12:09 AM  Result Value Ref Range Status   MRSA by PCR NEGATIVE NEGATIVE Final    Comment:        The GeneXpert MRSA Assay (FDA approved for NASAL specimens only), is one component of a comprehensive MRSA colonization surveillance program. It is not intended to diagnose MRSA infection nor to guide or monitor treatment for MRSA infections. Performed at [facility], [place] [address], [place], [place] [address]     Radiology Reports Ct Abdomen Pelvis Wo Contrast  Result Date: 08/13/2018 CLINICAL DATA:  Groin swelling EXAM: CT ABDOMEN AND PELVIS WITHOUT CONTRAST TECHNIQUE: Multidetector CT imaging of the abdomen and pelvis was performed following the standard protocol without IV contrast. COMPARISON:  MRI 07/10/2018 FINDINGS: Lower chest: Lung bases demonstrate no acute consolidation or effusion. The heart size is normal. Small hiatal hernia. Hepatobiliary: No focal liver abnormality is seen. No gallstones, gallbladder wall thickening, or biliary dilatation. Pancreas: Unremarkable. No pancreatic ductal dilatation or surrounding inflammatory changes. Spleen: Normal in size without focal abnormality. Adrenals/Urinary Tract: Adrenal glands are within normal limits. No hydronephrosis. Thick-walled urinary bladder Stomach/Bowel: Stomach is nonenlarged. No dilated small bowel. Extensive gas collection Between the rectum and prostate. Vascular/Lymphatic: Mild aortic atherosclerosis. No aneurysm. No significantly enlarged lymph nodes. Reproductive: Metallic densities along the posterior aspect of the prostate. Large gas collections around the prostate gland. Moderate edema of the scrotum and around the penis. Other: No free air. Fat in the left inguinal canal. Edema within the pubic soft tissues. Gas and fluid collection extending from the rectal area and along both sides of the prostate gland. Musculoskeletal: No acute or suspicious abnormality. IMPRESSION: 1. Moderate  edema and mild inflammatory changes involving the scrotum and soft tissues about the penis, but without soft tissue emphysema. Mild edema within the pubic soft tissues. 2. Abnormal gas and fluid collection between the rectum and prostate gland and extending along the right and left aspects of the prostate gland, felt to correspond to previously demonstrated rectal ulcer and sinus tracks. 3. Thick-walled appearance of the urinary bladder, possible cystitis Electronically Signed   By: Donavan Foil M.D.   On: 08/13/2018 23:19   Dg Chest 2 View  Result Date: 08/13/2018 CLINICAL DATA:  Acute onset of genital swelling. Fever. EXAM: CHEST - 2 VIEW COMPARISON:  Chest radiograph performed 07/07/2018 FINDINGS: The lungs are well-aerated. Minimal left  basilar atelectasis is noted. There is no evidence of pleural effusion or pneumothorax. The heart is normal in size; the mediastinal contour is within normal limits. No acute osseous abnormalities are seen. IMPRESSION: Minimal left basilar atelectasis noted. Lungs otherwise clear. Electronically Signed   By: Garald Balding M.D.   On: 08/13/2018 22:16   US Renal  Result Date: 08/14/2018 CLINICAL DATA:  Acute renal failure. EXAM: RENAL / URINARY TRACT ULTRASOUND COMPLETE COMPARISON:  Noncontrast CT yesterday. FINDINGS: Right Kidney: Renal measurements: 12.6 x 5.2 x 6.1 cm = volume: 207 mL . Echogenicity within normal limits. No mass or hydronephrosis visualized. Left Kidney: Renal measurements: 12.8 x 5.1 x 5.7 cm = volume: 195 mL. Echogenicity within normal limits. No mass or hydronephrosis visualized. Bladder: Bladder wall thickening of 9 mm, as seen on CT. IMPRESSION: 1. Bladder wall thickening, similar to CT yesterday. 2. Unremarkable sonographic appearance of both kidneys. No hydronephrosis. Electronically Signed   By: Keith Rake M.D.   On: 08/14/2018 03:48   US Scrotum W/doppler  Result Date: 08/13/2018 CLINICAL DATA:  Initial evaluation for acute testicular  pain, swelling. EXAM: SCROTAL ULTRASOUND DOPPLER ULTRASOUND OF THE TESTICLES TECHNIQUE: Complete ultrasound examination of the testicles, epididymis, and other scrotal structures was performed. Color and spectral Doppler ultrasound were also utilized to evaluate blood flow to the testicles. COMPARISON:  None. FINDINGS: Right testicle Measurements: 3.5 x 2.5 x 1.9 cm. No mass lesion. Testicular microlithiasis noted. Left testicle Measurements: 3.9 x 2.2 x 2.3 cm. No mass lesion. Testicular microlithiasis noted. Right epididymis:  Normal in size and appearance. Left epididymis: Diffusely increased vascularity seen within the left epididymis, suggesting acute epididymitis. Hydrocele:  Bilateral hydroceles noted. Varicocele:  Bilateral varicoceles noted. Pulsed Doppler interrogation of both testes demonstrates normal low resistance arterial and venous waveforms bilaterally. Incidental note made of a probable small fat containing right inguinal hernia. IMPRESSION: 1. Increased vascularity within the left epididymis, suggesting acute epididymitis. No evidence for testicular torsion. 2. Small moderate bilateral hydroceles, likely reactive. 3. Small bilateral varicoceles. 4. Probable small fat containing right inguinal hernia. 5. Testicular microlithiasis. Current literature suggests that testicular microlithiasis is not a significant independent risk factor for development of testicular carcinoma, and that follow up imaging is not warranted in the absence of other risk factors. Monthly testicular self-examination and annual physical exams are considered appropriate surveillance. If patient has other risk factors for testicular carcinoma, then referral to Urology should be considered. (Reference: DeCastro, et al.: A 5-Year Follow up Study of Asymptomatic Men with Testicular Microlithiasis. J Urol 2008; [id number].) Electronically Signed   By: Jeannine Boga M.D.   On: 08/13/2018 22:48   US Abdomen Limited  Ruq  Result Date: 08/14/2018 CLINICAL DATA:  Initial evaluation for elevated AST with leukocytosis. EXAM: ULTRASOUND ABDOMEN LIMITED RIGHT UPPER QUADRANT COMPARISON:  None. FINDINGS: Gallbladder: No gallstones or wall thickening visualized. No sonographic Murphy sign noted by sonographer. Common bile duct: Diameter: 4.6 mm Liver: No focal lesion identified. Within normal limits in parenchymal echogenicity. Portal vein is patent on color Doppler imaging with normal direction of blood flow towards the liver. IMPRESSION: Normal right upper quadrant ultrasound. No cholelithiasis, evidence for acute cholecystitis, or biliary dilatation. Electronically Signed   By: Jeannine Boga M.D.   On: 08/14/2018 03:43     Phillips Climes M.D on 08/15/2018 at 3:06 PM  Between 7am to 7pm - Pager - [phone]  After 7pm go to [email or web address] - password [facility]  [facility] -  Office  [phone]

## 2018-08-15 NOTE — Progress Notes (Signed)
Severe sepsis (HCC)  Subjective: Pt feels better.  Getting transfusion today for what appears to be chronic anemia with hemodilution.  States he had several episodes of diarrhea recently  Objective: Vital signs in last 24 hours: Temp:  [98.2 F (36.8 C)-101.6 F (38.7 C)] 98.4 F (36.9 C) (01/05 0905) Pulse Rate:  [83-118] 89 (01/05 0840) Resp:  [19-35] 22 (01/05 0840) BP: (104-143)/(48-75) 113/56 (01/05 0905) SpO2:  [94 %-100 %] 96 % (01/05 0840) Last BM Date: 08/14/18  Intake/Output from previous day: 01/04 0701 - 01/05 0700 In: 2020 [P.O.:120; I.V.:1500; IV Piggyback:400] Out: -  Intake/Output this shift: Total I/O In: -  Out: 1200 [Urine:1200]  General appearance: alert and cooperative GI: normal findings: soft, non-tender Rectal exam deferred to avoid further trauma to the area  Lab Results:  Results for orders placed or performed during the hospital encounter of 08/13/18 (from the past 24 hour(s))  Ferritin     Status: Abnormal   Collection Time: 08/14/18  4:05 PM  Result Value Ref Range   Ferritin 635 (H) 24 - 336 ng/mL  Reticulocytes     Status: Abnormal   Collection Time: 08/14/18  4:05 PM  Result Value Ref Range   Retic Ct Pct 1.6 0.4 - 3.1 %   RBC. 2.41 (L) 4.22 - 5.81 MIL/uL   Retic Count, Absolute 37.4 19.0 - 186.0 K/uL   Immature Retic Fract 19.1 (H) 2.3 - 15.9 %  MRSA PCR Screening     Status: None   Collection Time: 08/15/18 12:09 AM  Result Value Ref Range   MRSA by PCR NEGATIVE NEGATIVE  Vitamin B12     Status: None   Collection Time: 08/15/18  2:38 AM  Result Value Ref Range   Vitamin B-12 505 180 - 914 pg/mL  Folate     Status: None   Collection Time: 08/15/18  2:38 AM  Result Value Ref Range   Folate 8.1 >5.9 ng/mL  Iron and TIBC     Status: Abnormal   Collection Time: 08/15/18  2:38 AM  Result Value Ref Range   Iron 10 (L) 45 - 182 ug/dL   TIBC 195 (L) 250 - 450 ug/dL   Saturation Ratios 5 (L) 17.9 - 39.5 %   UIBC 185 ug/dL  CBC      Status: Abnormal   Collection Time: 08/15/18  2:38 AM  Result Value Ref Range   WBC 21.5 (H) 4.0 - 10.5 K/uL   RBC 1.90 (L) 4.22 - 5.81 MIL/uL   Hemoglobin 5.6 (LL) 13.0 - 17.0 g/dL   HCT 16.7 (L) 39.0 - 52.0 %   MCV 87.9 80.0 - 100.0 fL   MCH 29.5 26.0 - 34.0 pg   MCHC 33.5 30.0 - 36.0 g/dL   RDW 16.2 (H) 11.5 - 15.5 %   Platelets 282 150 - 400 K/uL   nRBC 0.0 0.0 - 0.2 %  Comprehensive metabolic panel     Status: Abnormal   Collection Time: 08/15/18  2:38 AM  Result Value Ref Range   Sodium 134 (L) 135 - 145 mmol/L   Potassium 3.6 3.5 - 5.1 mmol/L   Chloride 105 98 - 111 mmol/L   CO2 20 (L) 22 - 32 mmol/L   Glucose, Bld 145 (H) 70 - 99 mg/dL   BUN 93 (H) 6 - 20 mg/dL   Creatinine, Ser 4.80 (H) 0.61 - 1.24 mg/dL   Calcium 7.8 (L) 8.9 - 10.3 mg/dL   Total Protein 5.7 (L) 6.5 -  8.1 g/dL   Albumin 1.2 (L) 3.5 - 5.0 g/dL   AST 35 15 - 41 U/L   ALT 26 0 - 44 U/L   Alkaline Phosphatase 128 (H) 38 - 126 U/L   Total Bilirubin 3.7 (H) 0.3 - 1.2 mg/dL   GFR calc non Af Amer 12 (L) >60 mL/min   GFR calc Af Amer 14 (L) >60 mL/min   Anion gap 9 5 - 15  Type and screen [place]     Status: None (Preliminary result)   Collection Time: 08/15/18  5:55 AM  Result Value Ref Range   ABO/RH(D) O POS    Antibody Screen NEG    Sample Expiration 08/18/2018    Unit Number [id number]    Blood Component Type RED CELLS,LR    Unit division 00    Status of Unit ALLOCATED    Transfusion Status OK TO TRANSFUSE    Crossmatch Result Compatible    Unit Number [id number]    Blood Component Type RED CELLS,LR    Unit division 00    Status of Unit ISSUED    Transfusion Status OK TO TRANSFUSE    Crossmatch Result      Compatible Performed at [facility], [place]. [address], [place], [place] [address]   Prepare RBC     Status: None   Collection Time: 08/15/18  5:55 AM  Result Value Ref Range   Order Confirmation      ORDER PROCESSED BY BLOOD BANK Performed at [facility], [place] [address] [place], [place] [address]   Prepare RBC     Status: None   Collection Time: 08/15/18  8:11 AM  Result Value Ref Range   Order Confirmation      ORDER PROCESSED BY BLOOD BANK already have 2 units available. Performed at [facility], [place] [address] [place], [place] [address]   Vancomycin, random     Status: None   Collection Time: 08/15/18  8:17 AM  Result Value Ref Range   Vancomycin Rm 9      Studies/Results Radiology     MEDS, Scheduled . sodium chloride   Intravenous Once  . sodium chloride   Intravenous Once  . aspirin  81 mg Oral Daily  . atorvastatin  40 mg Oral Q supper  . cholecalciferol  2,000 Units Oral Daily  . DULoxetine  60 mg Oral Q supper  . ferrous sulfate  325 mg Oral Q breakfast  . metoprolol tartrate  50 mg Oral BID  . multivitamin with minerals  1 tablet Oral Daily  . pantoprazole  40 mg Oral BID AC  . prazosin  2 mg Oral QHS  . vancomycin variable dose per unstable renal function (pharmacist dosing)   Does not apply See admin instructions     Assessment: Pelvic sepsis most likely due to full thickness perforation of rectal ulcer and recent diarrhea  Plan: Nothing per rectum Will start a fiber supplement to bulk stools and hopefully minimize fecal contamination of the pelvis Cont abx, vitals and labs show significant improvement If this resolves, pt will need to f/u in the office with Dr Dema Severin or myself for anoscopy If it does not resolve, he will most likely need a diverting sigmoid colostomy with rectal washout   LOS: 1 day    Rosario Adie, MD [facility], [place] [phone]   08/15/2018 9:39 AM

## 2018-08-15 NOTE — Progress Notes (Signed)
CRITICAL VALUE ALERT  Critical Value:  Hemoglobin 5.6  Date & Time Notied:  08/15/2018 at 0436  Provider Notified: X. Blount  Orders Received/Actions taken: transfuse 2 unit of RBC

## 2018-08-15 NOTE — Progress Notes (Addendum)
Pharmacy Antibiotic Note  Phillip Blackwell is a 58 y.o. male admitted on 08/13/2018 with sepsis.  Pharmacy has been consulted for Vancomycin and Cefepime dosing.  Patient continues to fever with Tmax 101.6. WBC down to 21.5 and Scr is improving, down to 4.8. Vancomycin random level today was subtherapeutic at 9. Will re-dose vancomycin.  Plan: Vancomycin 1250 mg IV x 1, pharmacy will assess daily for further dosing based on renal dysfunction. Continue Cefepime 1 gram IV q24hr Will monitor renal function, C&S, and vancomycin levels as appropriate.  Height: 6' (182.9 cm) Weight: 190 lb (86.2 kg) IBW/kg (Calculated) : 77.6  Temp (24hrs), Avg:99.7 F (37.6 C), Min:98.2 F (36.8 C), Max:101.6 F (38.7 C)  Recent Labs  Lab 08/13/18 2000 08/13/18 2023 08/13/18 2313 08/14/18 0438 08/14/18 0916 08/15/18 0238 08/15/18 0817  WBC 41.7*  --   --  31.8* 28.4* 21.5*  --   CREATININE 7.23*  --   --  6.38*  --  4.80*  --   LATICACIDVEN  --  3.80* 1.95*  --   --   --   --   VANCORANDOM  --   --   --   --   --   --  9    Estimated Creatinine Clearance: 18.2 mL/min (A) (by C-G formula based on SCr of 4.8 mg/dL (H)).    No Known Allergies  Antimicrobials this admission: Vanc 01/03 >>  Cefepime 01/03 >>  Microbiology Results: 1/3 BCx: NGTD 1/3 UCx: NGF 1/5 MRSA PCR: negative   Thank you for allowing pharmacy to be a part of this patient's care.  Jackson Latino, PharmD PGY1 Pharmacy Resident Phone [phone] 08/15/2018     9:38 AM

## 2018-08-15 NOTE — Progress Notes (Signed)
Pt completed two unit of blood, CBC reorder post one hour after transfusion as order by MD.

## 2018-08-15 NOTE — Progress Notes (Signed)
Subjective/Chief Complaint:   1 - Prostate Cancer, Likely Recurrent - s/p primary external beam radiation completed 03/2017 in [place] for larger volume Gleason 6 disease. Initial PSA 5.5 and diagnosed 2017 by Dr. Alyson Ingles.  Recent PSA Course: 06/2017 - PSA 1.1 06/2018 - PSA 2.6   2 - Acute Renal Failure - Cr 7.2 / K 5.0 by ER labs 08/14/18 on eval sepsis. [place] and CT w/o hydro or distend bladder. Baseline Cr 1.1 as recently as 07/2018.  3 - Sepsis of GI/GU Origin with Prostatitis / Epididymitis / Colitis - lactate 3.8, WBC 42k by Er labs 08/14/18 on eval fevers and malaise. UA unremarkable, CX pending. Scrotal US and Pelvic CT with inflmmation of epidydimis / prostate / peri-rectal gas c/w likely infection stemming from colon microperf seeding prostate and GU tract. No abscesses / fluid collections wtihin prostate or pelvis.   4 - Rectal Ulcer/Suspect Perforation - known DX rectal ulcer with periodic bleeding following prostate radiation 2018. Flex-Sig / BX 06/2018 of ulcer edges negative for malignancy. CT 08/14/18 with gas in peri-rectal / peri-prostatic space c/w likely microperforation.   Today " Phillip Blackwell" is improving some. Lactate and HR trending down, Acceptable UOP and GFR also trending somewhat better.   Objective: Vital signs in last 24 hours: Temp:  [98.2 F (36.8 C)-101.6 F (38.7 C)] 98.2 F (36.8 C) (01/05 0531) Pulse Rate:  [93-118] 93 (01/05 0354) Resp:  [19-35] 19 (01/05 0354) BP: (102-143)/(48-75) 106/59 (01/05 0354) SpO2:  [91 %-100 %] 97 % (01/05 0354) Last BM Date: 08/14/18  Intake/Output from previous day: 01/04 0701 - 01/05 0700 In: 2020 [P.O.:120; I.V.:1500; IV Piggyback:400] Out: -  Intake/Output this shift: No intake/output data recorded.  Physical Exam  Constitutional: He appears well-developed.  HENT:  Head: Normocephalic.  Eyes: Pupils are equal, round, and reactive to light.  Neck: Normal range of motion.  Cardiovascular:  Improved, mild  regular tachycardia by bedside monitor  Respiratory:  Non-labored on room air GI: Soft. There is no abdominal tenderness. There is no rebound and no guarding.  Genitourinary:    Genitourinary Comments: Penoscrotal swelling / tenderness w/o fluctuence or crepitus, stable.  Musculoskeletal: Normal range of motion.  Neurological: He is alert.  Skin: Skin is warm.  Psychiatric: He has a normal mood and affect. His behavior is normal.   Lab Results:  Recent Labs    08/14/18 0916 08/15/18 0238  WBC 28.4* 21.5*  HGB 6.9* 5.6*  HCT 21.2* 16.7*  PLT 262 282   BMET Recent Labs    08/14/18 0438 08/15/18 0238  NA 132* 134*  K 4.4 3.6  CL 106 105  CO2 13* 20*  GLUCOSE 151* 145*  BUN 94* 93*  CREATININE 6.38* 4.80*  CALCIUM 7.7* 7.8*   PT/INR Recent Labs    08/13/18 2000  LABPROT 14.9  INR 1.18   ABG No results for input(s): PHART, HCO3 in the last 72 hours.  Invalid input(s): PCO2, PO2  Studies/Results: Ct Abdomen Pelvis Wo Contrast  Result Date: 08/13/2018 CLINICAL DATA:  Groin swelling EXAM: CT ABDOMEN AND PELVIS WITHOUT CONTRAST TECHNIQUE: Multidetector CT imaging of the abdomen and pelvis was performed following the standard protocol without IV contrast. COMPARISON:  MRI 07/10/2018 FINDINGS: Lower chest: Lung bases demonstrate no acute consolidation or effusion. The heart size is normal. Small hiatal hernia. Hepatobiliary: No focal liver abnormality is seen. No gallstones, gallbladder wall thickening, or biliary dilatation. Pancreas: Unremarkable. No pancreatic ductal dilatation or surrounding inflammatory changes. Spleen: Normal in  size without focal abnormality. Adrenals/Urinary Tract: Adrenal glands are within normal limits. No hydronephrosis. Thick-walled urinary bladder Stomach/Bowel: Stomach is nonenlarged. No dilated small bowel. Extensive gas collection Between the rectum and prostate. Vascular/Lymphatic: Mild aortic atherosclerosis. No aneurysm. No significantly  enlarged lymph nodes. Reproductive: Metallic densities along the posterior aspect of the prostate. Large gas collections around the prostate gland. Moderate edema of the scrotum and around the penis. Other: No free air. Fat in the left inguinal canal. Edema within the pubic soft tissues. Gas and fluid collection extending from the rectal area and along both sides of the prostate gland. Musculoskeletal: No acute or suspicious abnormality. IMPRESSION: 1. Moderate edema and mild inflammatory changes involving the scrotum and soft tissues about the penis, but without soft tissue emphysema. Mild edema within the pubic soft tissues. 2. Abnormal gas and fluid collection between the rectum and prostate gland and extending along the right and left aspects of the prostate gland, felt to correspond to previously demonstrated rectal ulcer and sinus tracks. 3. Thick-walled appearance of the urinary bladder, possible cystitis Electronically Signed   By: Donavan Foil M.D.   On: 08/13/2018 23:19   Dg Chest 2 View  Result Date: 08/13/2018 CLINICAL DATA:  Acute onset of genital swelling. Fever. EXAM: CHEST - 2 VIEW COMPARISON:  Chest radiograph performed 07/07/2018 FINDINGS: The lungs are well-aerated. Minimal left basilar atelectasis is noted. There is no evidence of pleural effusion or pneumothorax. The heart is normal in size; the mediastinal contour is within normal limits. No acute osseous abnormalities are seen. IMPRESSION: Minimal left basilar atelectasis noted. Lungs otherwise clear. Electronically Signed   By: Garald Balding M.D.   On: 08/13/2018 22:16   US Renal  Result Date: 08/14/2018 CLINICAL DATA:  Acute renal failure. EXAM: RENAL / URINARY TRACT ULTRASOUND COMPLETE COMPARISON:  Noncontrast CT yesterday. FINDINGS: Right Kidney: Renal measurements: 12.6 x 5.2 x 6.1 cm = volume: 207 mL . Echogenicity within normal limits. No mass or hydronephrosis visualized. Left Kidney: Renal measurements: 12.8 x 5.1 x 5.7 cm =  volume: 195 mL. Echogenicity within normal limits. No mass or hydronephrosis visualized. Bladder: Bladder wall thickening of 9 mm, as seen on CT. IMPRESSION: 1. Bladder wall thickening, similar to CT yesterday. 2. Unremarkable sonographic appearance of both kidneys. No hydronephrosis. Electronically Signed   By: Keith Rake M.D.   On: 08/14/2018 03:48   US Scrotum W/doppler  Result Date: 08/13/2018 CLINICAL DATA:  Initial evaluation for acute testicular pain, swelling. EXAM: SCROTAL ULTRASOUND DOPPLER ULTRASOUND OF THE TESTICLES TECHNIQUE: Complete ultrasound examination of the testicles, epididymis, and other scrotal structures was performed. Color and spectral Doppler ultrasound were also utilized to evaluate blood flow to the testicles. COMPARISON:  None. FINDINGS: Right testicle Measurements: 3.5 x 2.5 x 1.9 cm. No mass lesion. Testicular microlithiasis noted. Left testicle Measurements: 3.9 x 2.2 x 2.3 cm. No mass lesion. Testicular microlithiasis noted. Right epididymis:  Normal in size and appearance. Left epididymis: Diffusely increased vascularity seen within the left epididymis, suggesting acute epididymitis. Hydrocele:  Bilateral hydroceles noted. Varicocele:  Bilateral varicoceles noted. Pulsed Doppler interrogation of both testes demonstrates normal low resistance arterial and venous waveforms bilaterally. Incidental note made of a probable small fat containing right inguinal hernia. IMPRESSION: 1. Increased vascularity within the left epididymis, suggesting acute epididymitis. No evidence for testicular torsion. 2. Small moderate bilateral hydroceles, likely reactive. 3. Small bilateral varicoceles. 4. Probable small fat containing right inguinal hernia. 5. Testicular microlithiasis. Current literature suggests that testicular microlithiasis is  not a significant independent risk factor for development of testicular carcinoma, and that follow up imaging is not warranted in the absence of other  risk factors. Monthly testicular self-examination and annual physical exams are considered appropriate surveillance. If patient has other risk factors for testicular carcinoma, then referral to Urology should be considered. (Reference: DeCastro, et al.: A 5-Year Follow up Study of Asymptomatic Men with Testicular Microlithiasis. J Urol 2008; [id number].) Electronically Signed   By: Jeannine Boga M.D.   On: 08/13/2018 22:48   US Abdomen Limited Ruq  Result Date: 08/14/2018 CLINICAL DATA:  Initial evaluation for elevated AST with leukocytosis. EXAM: ULTRASOUND ABDOMEN LIMITED RIGHT UPPER QUADRANT COMPARISON:  None. FINDINGS: Gallbladder: No gallstones or wall thickening visualized. No sonographic Murphy sign noted by sonographer. Common bile duct: Diameter: 4.6 mm Liver: No focal lesion identified. Within normal limits in parenchymal echogenicity. Portal vein is patent on color Doppler imaging with normal direction of blood flow towards the liver. IMPRESSION: Normal right upper quadrant ultrasound. No cholelithiasis, evidence for acute cholecystitis, or biliary dilatation. Electronically Signed   By: Jeannine Boga M.D.   On: 08/14/2018 03:43    Anti-infectives: Anti-infectives (From admission, onward)   Start     Dose/Rate Route Frequency Ordered Stop   08/14/18 2000  ceFEPIme (MAXIPIME) 1 g in sodium chloride 0.9 % 100 mL IVPB     1 g 200 mL/hr over 30 Minutes Intravenous Every 24 hours 08/13/18 2052     08/13/18 2052  vancomycin variable dose per unstable renal function (pharmacist dosing)      Does not apply See admin instructions 08/13/18 2052     08/13/18 2045  ceFEPIme (MAXIPIME) 2 g in sodium chloride 0.9 % 100 mL IVPB     2 g 200 mL/hr over 30 Minutes Intravenous  Once 08/13/18 2038 08/13/18 2141   08/13/18 2045  metroNIDAZOLE (FLAGYL) IVPB 500 mg     500 mg 100 mL/hr over 60 Minutes Intravenous Every 8 hours 08/13/18 2038     08/13/18 2045  vancomycin (VANCOCIN) IVPB 1000  mg/200 mL premix     1,000 mg 200 mL/hr over 60 Minutes Intravenous  Once 08/13/18 2038 08/13/18 2333      Assessment/Plan:  1 - Prostate Cancer, Likely Recurrent - PSA pattern most c/w active disease, but no signs of metastatic involvement. May warrant tissue sampling via transperineal approach in elective setting. No further diagnostics or intervention this admission.   2 - Acute Renal Failure - pre-renal due to sepsis. No hydro to warrant stentig / neph tubes. Rec continued foley for accurate UOP monitoring, appreciate nephrology comanagement.   3 - Sepsis of GI/GU Origin with Prostatitis / Epididymitis / Colitis - I am concerned this is stemming from rectal rectal perforation with seeding of prostate, GU tract, and likely bacteremia based on imaging, labs, history. No indications for GU surgery / drainable collections. Agree with aggressive broad spectrum ABX renal dosed pedning additional CX data. Suspect coliforms.    4 - Rectal Ulcer/Suspect Perforation - likely from progression of known radiation-induced rectal ulcer, very well may ultimately need GI and possible eventual GU diversion (if still has problems despite GI diversion). Appreciate gen-surg opinion.  Will follow  Please call me directly with questions anytime.   Alexis Frock 08/15/2018

## 2018-08-16 LAB — CBC
HCT: 25.1 % — ABNORMAL LOW (ref 39.0–52.0)
Hemoglobin: 8.3 g/dL — ABNORMAL LOW (ref 13.0–17.0)
MCH: 28.7 pg (ref 26.0–34.0)
MCHC: 33.1 g/dL (ref 30.0–36.0)
MCV: 86.9 fL (ref 80.0–100.0)
Platelets: 321 10*3/uL (ref 150–400)
RBC: 2.89 MIL/uL — ABNORMAL LOW (ref 4.22–5.81)
RDW: 15.9 % — AB (ref 11.5–15.5)
WBC: 18.5 10*3/uL — ABNORMAL HIGH (ref 4.0–10.5)
nRBC: 0 % (ref 0.0–0.2)

## 2018-08-16 LAB — COMPREHENSIVE METABOLIC PANEL
ALT: 33 U/L (ref 0–44)
AST: 47 U/L — ABNORMAL HIGH (ref 15–41)
Albumin: 1.4 g/dL — ABNORMAL LOW (ref 3.5–5.0)
Alkaline Phosphatase: 88 U/L (ref 38–126)
Anion gap: 13 (ref 5–15)
BUN: 76 mg/dL — ABNORMAL HIGH (ref 6–20)
CO2: 21 mmol/L — ABNORMAL LOW (ref 22–32)
Calcium: 7.9 mg/dL — ABNORMAL LOW (ref 8.9–10.3)
Chloride: 101 mmol/L (ref 98–111)
Creatinine, Ser: 3.15 mg/dL — ABNORMAL HIGH (ref 0.61–1.24)
GFR calc Af Amer: 24 mL/min — ABNORMAL LOW (ref >60)
GFR calc non Af Amer: 20 mL/min — ABNORMAL LOW (ref >60)
Glucose, Bld: 94 mg/dL (ref 70–99)
Potassium: 3.2 mmol/L — ABNORMAL LOW (ref 3.5–5.1)
Sodium: 135 mmol/L (ref 135–145)
TOTAL PROTEIN: 6.1 g/dL — AB (ref 6.5–8.1)
Total Bilirubin: 2.9 mg/dL — ABNORMAL HIGH (ref 0.3–1.2)

## 2018-08-16 MED ORDER — POTASSIUM CHLORIDE CRYS ER 20 MEQ PO TBCR
40.0000 meq | EXTENDED_RELEASE_TABLET | Freq: Once | ORAL | Status: AC
  Administered 2018-08-16: 40 meq via ORAL
  Filled 2018-08-16: qty 2

## 2018-08-16 MED ORDER — SODIUM CHLORIDE 0.9 % IV SOLN
2.0000 g | INTRAVENOUS | Status: DC
  Administered 2018-08-16 – 2018-08-19 (×4): 2 g via INTRAVENOUS
  Filled 2018-08-16 (×5): qty 2

## 2018-08-16 MED ORDER — OXYCODONE HCL 5 MG PO TABS
5.0000 mg | ORAL_TABLET | Freq: Four times a day (QID) | ORAL | Status: DC | PRN
  Administered 2018-08-16 – 2018-08-23 (×21): 5 mg via ORAL
  Filled 2018-08-16 (×21): qty 1

## 2018-08-16 NOTE — Progress Notes (Signed)
[facility]    NEPHROLOGY PROGRESS NOTE  SUBJECTIVE: Continues to have pain but is feeling somewhat better.  Denies chest pain, shortness of breath, nausea, vomiting, skin rash, or myalgias.  All other review of systems are negative.   OBJECTIVE:  Vitals:   08/16/18 1604 08/16/18 1632  BP: (!) 146/70   Pulse:    Resp: 16   Temp: 98.9 F (37.2 C) (!) 100.4 F (38 C)  SpO2:     I/O last 3 completed shifts: In: 3743.6 [P.O.:580; I.V.:2049.6; Blood:814; IV Piggyback:300] Out: 5809 [Urine:3415]   General:  AAOx3 no distress, diaphoretic HEENT: MMM [place] AT anicteric sclera Neck:  No JVD, no adenopathy CV:  Heart RRR  Lungs:  L/S CTA bilaterally Abd:  abd SNT/ND with normal BS GU:  Bladder non-palpable, Foley in place, swelling of the scrotum and penis was noted. Extremities: (+)1 bilateral LE edema Skin:  No skin rash Psych:  normal mood and affect Neuro:  no focal deficits  MEDICATIONS:   Current Facility-Administered Medications:  .  0.9 %  sodium chloride infusion (Manually program via Guardrails IV Fluids), , Intravenous, Once, Blount, Scarlette Shorts T, NP .  acetaminophen (TYLENOL) tablet 650 mg, 650 mg, Oral, Q6H PRN, 650 mg at 08/16/18 0804 **OR** acetaminophen (TYLENOL) suppository 650 mg, 650 mg, Rectal, Q6H PRN, Shela Leff, MD .  atorvastatin (LIPITOR) tablet 40 mg, 40 mg, Oral, Q supper, Shela Leff, MD, 40 mg at 08/16/18 1602 .  ceFEPIme (MAXIPIME) 2 g in sodium chloride 0.9 % 100 mL IVPB, 2 g, Intravenous, Q24H, Elgergawy, Silver Huguenin, MD .  cholecalciferol (VITAMIN D3) tablet 2,000 Units, 2,000 Units, Oral, Daily, Shela Leff, MD, 2,000 Units at 08/16/18 [phone] .  clonazePAM (KLONOPIN) tablet 1 mg, 1 mg, Oral, QHS PRN, Shela Leff, MD .  ferrous sulfate tablet 325 mg, 325 mg, Oral, Q breakfast, Shela Leff, MD, 325 mg at 08/16/18 0803 .  metoprolol tartrate (LOPRESSOR) tablet 50 mg, 50 mg, Oral, BID, Rizwan, Saima, MD, 50 mg  at 08/16/18 0942 .  metroNIDAZOLE (FLAGYL) IVPB 500 mg, 500 mg, Intravenous, Q8H, Shela Leff, MD, Last Rate: 100 mL/hr at 08/16/18 1314, 500 mg at 08/16/18 1314 .  multivitamin with minerals tablet 1 tablet, 1 tablet, Oral, Daily, Shela Leff, MD, 1 tablet at 08/16/18 [phone] .  ondansetron (ZOFRAN) injection 4 mg, 4 mg, Intravenous, Q6H PRN, Shela Leff, MD, 4 mg at 08/14/18 1653 .  oxyCODONE (Oxy IR/ROXICODONE) immediate release tablet 5 mg, 5 mg, Oral, Q6H PRN, Elgergawy, Silver Huguenin, MD, 5 mg at 08/16/18 1602 .  pantoprazole (PROTONIX) EC tablet 40 mg, 40 mg, Oral, BID AC, Rizwan, Saima, MD, 40 mg at 08/16/18 1602 .  polycarbophil (FIBERCON) tablet 625 mg, 625 mg, Oral, BID, Leighton Ruff, MD, 539 mg at 08/16/18 0942 .  prazosin (MINIPRESS) capsule 2 mg, 2 mg, Oral, QHS, Shela Leff, MD, 2 mg at 08/15/18 2233 .  tamsulosin (FLOMAX) capsule 0.4 mg, 0.4 mg, Oral, Daily PRN, Shela Leff, MD     LABS:  CBC Latest Ref Rng & Units 08/16/2018 08/15/2018 08/15/2018  WBC 4.0 - 10.5 K/uL 18.5(H) 17.4(H) 21.5(H)  Hemoglobin 13.0 - 17.0 g/dL 8.3(L) 8.0(L) 5.6(LL)  Hematocrit 39.0 - 52.0 % 25.1(L) 24.0(L) 16.7(L)  Platelets 150 - 400 K/uL 321 310 282    CMP Latest Ref Rng & Units 08/16/2018 08/15/2018 08/14/2018  Glucose 70 - 99 mg/dL 94 145(H) 151(H)  BUN 6 - 20 mg/dL 76(H) 93(H) 94(H)  Creatinine 0.61 - 1.24 mg/dL 3.15(H)  4.80(H) 6.38(H)  Sodium 135 - 145 mmol/L 135 134(L) 132(L)  Potassium 3.5 - 5.1 mmol/L 3.2(L) 3.6 4.4  Chloride 98 - 111 mmol/L 101 105 106  CO2 22 - 32 mmol/L 21(L) 20(L) 13(L)  Calcium 8.9 - 10.3 mg/dL 7.9(L) 7.8(L) 7.7(L)  Total Protein 6.5 - 8.1 g/dL 6.1(L) 5.7(L) 6.1(L)  Total Bilirubin 0.3 - 1.2 mg/dL 2.9(H) 3.7(H) 5.0(H)  Alkaline Phos 38 - 126 U/L 88 128(H) 87  AST 15 - 41 U/L 47(H) 35 33  ALT 0 - 44 U/L 33 26 28    Lab Results  Component Value Date   CALCIUM 7.9 (L) 08/16/2018       Component Value Date/Time   COLORURINE AMBER (A)  08/13/2018 2010   APPEARANCEUR CLOUDY (A) 08/13/2018 2010   LABSPEC 1.014 08/13/2018 2010   [place] 5.0 08/13/2018 2010   [place] 08/13/2018 2010   [place] NEGATIVE 08/13/2018 2010   [place] NEGATIVE 08/13/2018 2010   [place] NEGATIVE 08/13/2018 2010   PROTEINUR 30 (A) 08/13/2018 2010   NITRITE NEGATIVE 08/13/2018 2010   LEUKOCYTESUR NEGATIVE 08/13/2018 2010   No results found for: PHART, PCO2ART, PO2ART, HCO3, TCO2, ACIDBASEDEF, O2SAT     Component Value Date/Time   IRON 10 (L) 08/15/2018 0238   TIBC 195 (L) 08/15/2018 0238   FERRITIN 635 (H) 08/14/2018 1605   IRONPCTSAT 5 (L) 08/15/2018 0238       ASSESSMENT/PLAN:     Problem List Items Addressed This Visit      Genitourinary   Acute renal failure (ARF) ([place])   RESOLVED: Epididymitis    Other Visit Diagnoses    Sepsis with acute renal failure and septic shock, due to unspecified organism, unspecified acute renal failure type ([place])    -  Primary   Relevant Medications   ceFEPIme (MAXIPIME) 2 g in sodium chloride 0.9 % 100 mL IVPB (Completed)   metroNIDAZOLE (FLAGYL) IVPB 500 mg   vancomycin (VANCOCIN) IVPB 1000 mg/200 mL premix (Completed)   vancomycin (VANCOCIN) 1,250 mg in sodium chloride 0.9 % 250 mL IVPB (Completed)   ceFEPIme (MAXIPIME) 2 g in sodium chloride 0.9 % 100 mL IVPB (Start on 08/16/2018  8:00 PM)      1.  Sepsis of GI/GU origin with prostatitis/epididymitis/colitis.  This is likely secondary from rectal perforation with seeding of the prostate and GU tract.  Continue antibiotics and treatment per urology.  Has been adequately fluid resuscitated.  Will discontinue IV fluids  2.  Acute kidney injury.  Likely secondary to sepsis and shock.  Renal function is improving.  Will hold IV fluids for now.  Trend serum creatinine.  3.  Anion gap metabolic acidosis.  Improved.  Off IVF - stable  4.  Severe symptomatic anemia.  S/p PRBCs   [employer], DO, FACP

## 2018-08-16 NOTE — Progress Notes (Addendum)
CC:  Severe sepsis  Subjective: He does not seem much improvement in scrotum or testicular swelling.  Diarrhea is better.  Objective: Vital signs in last 24 hours: Temp:  [98.3 F (36.8 C)-99.8 F (37.7 C)] 98.4 F (36.9 C) (01/06 0801) Pulse Rate:  [74-95] 95 (01/05 2233) Resp:  [13-24] 21 (01/06 0941) BP: (122-154)/(56-70) 122/56 (01/06 0941) SpO2:  [97 %-98 %] 97 % (01/06 0404) Last BM Date: 08/16/18 460 PO recorded 1400 IV 3415 urine Stool x 1 Afebrile, VSS Labs stable, creatinine improving WBC stable  H/H better RUQ KW:IOXBDZ right upper quadrant ultrasound. No cholelithiasis, evidence for acute cholecystitis, or biliary dilatation. CT 1/3:Moderate edema and mild inflammatory changes involving the scrotum and soft tissues about the penis, but without soft tissue emphysema. Mild edema within the pubic soft tissues. 2. Abnormal gas and fluid collection between the rectum and prostate gland and extending along the right and left aspects of the prostate gland, felt to correspond to previously demonstrated rectal ulcer and sinus tracks. 3. Thick-walled appearance of the urinary bladder, possible cystitis  Intake/Output from previous day: 01/05 0701 - 01/06 0700 In: 1823.6 [P.O.:460; I.V.:549.6; Blood:814] Out: 3299 [Urine:3415] Intake/Output this shift: No intake/output data recorded.  General appearance: alert, cooperative and no distress Male genitalia: normal, ongoing swelling and tendernes.  Foley in place  Lab Results:  Recent Labs    08/15/18 1821 08/16/18 0327  WBC 17.4* 18.5*  HGB 8.0* 8.3*  HCT 24.0* 25.1*  PLT 310 321    BMET Recent Labs    08/15/18 0238 08/16/18 0327  NA 134* 135  K 3.6 3.2*  CL 105 101  CO2 20* 21*  GLUCOSE 145* 94  BUN 93* 76*  CREATININE 4.80* 3.15*  CALCIUM 7.8* 7.9*   PT/INR Recent Labs    08/13/18 2000  LABPROT 14.9  INR 1.18    Recent Labs  Lab 08/13/18 2000 08/14/18 0438 08/15/18 0238  08/16/18 0327  AST 50* 33 35 47*  ALT 41 28 26 33  ALKPHOS 118 87 128* 88  BILITOT 6.4* 5.0* 3.7* 2.9*  PROT 7.8 6.1* 5.7* 6.1*  ALBUMIN 2.0* 1.5* 1.2* 1.4*     Lipase     Component Value Date/Time   LIPASE 33 08/13/2018 2047     Medications: . sodium chloride   Intravenous Once  . atorvastatin  40 mg Oral Q supper  . cholecalciferol  2,000 Units Oral Daily  . ferrous sulfate  325 mg Oral Q breakfast  . metoprolol tartrate  50 mg Oral BID  . multivitamin with minerals  1 tablet Oral Daily  . pantoprazole  40 mg Oral BID AC  . polycarbophil  625 mg Oral BID  . prazosin  2 mg Oral QHS  . vancomycin variable dose per unstable renal function (pharmacist dosing)   Does not apply See admin instructions   . ceFEPime (MAXIPIME) IV 1 g (08/15/18 2232)  . metronidazole 500 mg (08/16/18 0519)    Assessment/Plan Hx of prostate CA with external beam radiation Gleason 6 Acute renal failure -creatinine 6.38>>4.80>>3.15 today OSA - CPAP BPH Hiccups x 1 month  Full thickness perforation of rectal ulcer/diarrhea Severe sepsis - prostatitis/epididymitis/colitis  WBC 31.8>>21.5>>18.5 today Anemia - Transfuse 2 units  H/H 6.9>>5.6>> 8.3 today  FEN: Heart healthy ID: Maxipime 1/3>> day 4  Flagyl 1/3>> day 4 DVT: Anemia/SCD Follow up:  Dr. Eliezer Mccoy  Plan:  Medical management for now.  Follow up with Dr. Marcello Moores or Dr. Dema Severin.  Hoping  to avoid diverting colostomy.       LOS: 2 days    Wana Mount 08/16/2018 [phone]

## 2018-08-16 NOTE — Progress Notes (Signed)
PROGRESS NOTE                                                                                                                                                                                                             Patient Demographics:    Phillip Blackwell, is a 58 y.o. male, DOB - 23-Nov-1960, BMW:[id number]  Admit date - 08/13/2018   Admitting Physician Shela Leff, MD  Outpatient Primary MD for the patient is System, Pcp Not In  LOS - 2   Chief Complaint  Patient presents with  . Groin Swelling  . Weakness  . Vomiting  . Diarrhea       Brief Narrative    58 y.o. male with medical history significant of anxiety, depression, GERD, hypertension, CVA, OSA presenting to the hospital for evaluation of groin pain and swelling , fever, blood in stool, work-up was significant for sepsis, acute blood loss anemia, is admitted for further work-up.   Subjective:    Phillip Blackwell today for generalized weakness, fatigue, low-grade temperature.   Assessment  & Plan :    Principal Problem:   Severe sepsis ([place]) Active Problems:   Acute renal failure (ARF) (HCC)   Serum total bilirubin elevated   Hematochezia   Hyponatremia   Normal anion gap metabolic acidosis   Testicular microlithiasis   OSA (obstructive sleep apnea)    Severe sepsis -Sepsis criteria met on admission, leukocytosis of 31.8, lactic acid 1.9, febrile, tachycardic and tachypneic, worsening renal function . -Sepsis stemming from rectal ulcer/colon microperforation, seeding prostate and GU tract, causing epididymitis/prostatitis, and colitis. -Treated with broad-spectrum antibiotic including IV vancomycin, cefepime and Flagyl, so far blood cultures and urine cultures with no growth -Continue with IV fluids -Leukocytosis trending down  Rectal Ulcer with suspected perforation -Patient with known diagnosis of rectal ulcer with pruritic bleeding status post radiation 2018, had a  flexible sigmoidoscopy with biopsy in November 2019, negative for malignancy, current CT with evidence of gas in the perirectal area, periprostatic space, this is likely microperforation. -general surgery  input greatly appreciated, continue with IV antibiotics, per general surgery may require colostomy to divert the fecal stream at some point, none emergently.  Foley can be avoided  Prostatitis/epididymitis -This is in the setting of rectal ulcer perforation, causing seeding of the prostate and scrotal area, urology input was appreciated, continue  with broad-spectrum antibiotic coverage for now, continue with Foley catheter, further recommendation per urology.  Acute renal failure with metabolic acidosis - BUN 92.  Serum creatinine 7.2 on admission, was 1.0 a month ago.   -Likely in the setting of ATN from severe sepsis, with contaminant use of lisinopril -Renal ultrasound with no obstruction -Nephrology input appreciated, with metabolic acidosis, initially on bicarb drip, creatinine improving, it is 3.5 today, having enough oral intake, appears to be euvolemic today, IV fluids has been stopped.  Hyponatremia -Mild, due to dehydration, improving with IV fluids  Hyperbilirubinemia -This is most likely in the setting of sepsis, especially with the abdomen pelvis showing no acute abnormalities, no right upper quadrant tenderness, . -It is trending down, continue to monitor closely   HTN- tachycardia - resume Metoprolol at 1/2 home dose which may help his sinus tachycardia as well - BP will tolerate it - hold Lisinopril   Acute blood loss anemia -globin 6.9 on admission, dropped to 5.6 after hydration, he did have some hematochezia secondary to his rectal ulcer, transfused units PRBC, with good response, hemoglobin is 8.3 today . -Having his rectal ulcer with perforation, procedure is recommended per surgery, so we will hold on GI consult.  OSA (obstructive sleep apnea) - CPAP  ordered  BPH - Flomax, Prazosin  Hiccups - has had them for 1 month- Thorazine ordered by his psychiatrist (for mood) about a month ago has not helped- d/c Thorazine - ? Due to severe reflux - will double Protonix  Anxiety/ depression - cont Cymbalta, Klonopin- d/c Thorazine  Protein calorie malnutrition -Protein is 1.4, will start on supplements  Code Status : full  Family Communication  : none at bedside  Disposition Plan  : Pending further work up   Consults  :  Urology, renal, General surgery  Procedures  : 2 unitsPRBC transfusion 08/15/2018  DVT Prophylaxis  :  SCD  Lab Results  Component Value Date   PLT 321 08/16/2018    Antibiotics  :   Anti-infectives (From admission, onward)   Start     Dose/Rate Route Frequency Ordered Stop   08/15/18 1030  vancomycin (VANCOCIN) 1,250 mg in sodium chloride 0.9 % 250 mL IVPB     1,250 mg 166.7 mL/hr over 90 Minutes Intravenous  Once 08/15/18 0942 08/15/18 1444   08/14/18 2000  ceFEPIme (MAXIPIME) 1 g in sodium chloride 0.9 % 100 mL IVPB     1 g 200 mL/hr over 30 Minutes Intravenous Every 24 hours 08/13/18 2052     08/13/18 2052  vancomycin variable dose per unstable renal function (pharmacist dosing)      Does not apply See admin instructions 08/13/18 2052     08/13/18 2045  ceFEPIme (MAXIPIME) 2 g in sodium chloride 0.9 % 100 mL IVPB     2 g 200 mL/hr over 30 Minutes Intravenous  Once 08/13/18 2038 08/13/18 2141   08/13/18 2045  metroNIDAZOLE (FLAGYL) IVPB 500 mg     500 mg 100 mL/hr over 60 Minutes Intravenous Every 8 hours 08/13/18 2038     08/13/18 2045  vancomycin (VANCOCIN) IVPB 1000 mg/200 mL premix     1,000 mg 200 mL/hr over 60 Minutes Intravenous  Once 08/13/18 2038 08/13/18 2333        Objective:   Vitals:   08/16/18 0404 08/16/18 0757 08/16/18 0801 08/16/18 0941  BP: (!) 154/68  140/70 (!) 122/56  Pulse:      Resp: (!) 22  (!) 24 Marland Kitchen)  21  Temp: 99.8 F (37.7 C) 99.6 F (37.6 C) 98.4 F (36.9 C)    TempSrc: Oral Oral Oral   SpO2: 97%     Weight:      Height:        Wt Readings from Last 3 Encounters:  08/13/18 86.2 kg  07/10/18 97 kg  08/22/16 97.1 kg     Intake/Output Summary (Last 24 hours) at 08/16/2018 1223 Last data filed at 08/15/2018 2232 Gross per 24 hour  Intake 1377.59 ml  Output 1865 ml  Net -487.41 ml     Physical Exam  Awake Alert, Oriented X 3, No new F.N deficits, Normal affect Symmetrical Chest wall movement, Good air movement bilaterally, CTAB RRR,No Gallops,Rubs or new Murmurs, No Parasternal Heave +ve B.Sounds, Abd Soft, some suprapubic tenderness, and scrotal swelling and tenderness  no Cyanosis, Clubbing or edema, No new Rash or bruise       Data Review:    CBC Recent Labs  Lab 08/13/18 2000 08/14/18 0438 08/14/18 0916 08/15/18 0238 08/15/18 1821 08/16/18 0327  WBC 41.7* 31.8* 28.4* 21.5* 17.4* 18.5*  HGB 8.4* 6.9* 6.9* 5.6* 8.0* 8.3*  HCT 26.9* 21.2* 21.2* 16.7* 24.0* 25.1*  PLT 284 259 262 282 310 321  MCV 93.4 95.1 93.0 87.9 86.3 86.9  MCH 29.2 30.9 30.3 29.5 28.8 28.7  MCHC 31.2 32.5 32.5 33.5 33.3 33.1  RDW 16.0* 16.3* 16.3* 16.2* 15.6* 15.9*  LYMPHSABS 1.2  --   --   --   --   --   MONOABS 2.1*  --   --   --   --   --   EOSABS 0.1  --   --   --   --   --   BASOSABS 0.1  --   --   --   --   --     Chemistries  Recent Labs  Lab 08/13/18 2000 08/14/18 0438 08/15/18 0238 08/16/18 0327  NA 130* 132* 134* 135  K 5.0 4.4 3.6 3.2*  CL 98 106 105 101  CO2 17* 13* 20* 21*  GLUCOSE 157* 151* 145* 94  BUN 92* 94* 93* 76*  CREATININE 7.23* 6.38* 4.80* 3.15*  CALCIUM 9.0 7.7* 7.8* 7.9*  AST 50* 33 35 47*  ALT 41 28 26 33  ALKPHOS 118 87 128* 88  BILITOT 6.4* 5.0* 3.7* 2.9*   ------------------------------------------------------------------------------------------------------------------ No results for input(s): CHOL, HDL, LDLCALC, TRIG, CHOLHDL, LDLDIRECT in the last 72 hours.  No results found for:  HGBA1C ------------------------------------------------------------------------------------------------------------------ No results for input(s): TSH, T4TOTAL, T3FREE, THYROIDAB in the last 72 hours.  Invalid input(s): FREET3 ------------------------------------------------------------------------------------------------------------------ Recent Labs    08/14/18 1605 08/15/18 0238  VITAMINB12  --  505  FOLATE  --  8.1  FERRITIN 635*  --   TIBC  --  195*  IRON  --  10*  RETICCTPCT 1.6  --     Coagulation profile Recent Labs  Lab 08/13/18 2000  INR 1.18    No results for input(s): DDIMER in the last 72 hours.  Cardiac Enzymes No results for input(s): CKMB, TROPONINI, MYOGLOBIN in the last 168 hours.  Invalid input(s): CK ------------------------------------------------------------------------------------------------------------------ No results found for: BNP  Inpatient Medications  Scheduled Meds: . sodium chloride   Intravenous Once  . atorvastatin  40 mg Oral Q supper  . cholecalciferol  2,000 Units Oral Daily  . ferrous sulfate  325 mg Oral Q breakfast  . metoprolol tartrate  50 mg Oral BID  . multivitamin  with minerals  1 tablet Oral Daily  . pantoprazole  40 mg Oral BID AC  . polycarbophil  625 mg Oral BID  . prazosin  2 mg Oral QHS  . vancomycin variable dose per unstable renal function (pharmacist dosing)   Does not apply See admin instructions   Continuous Infusions: . ceFEPime (MAXIPIME) IV 1 g (08/15/18 2232)  . metronidazole 500 mg (08/16/18 0519)   PRN Meds:.acetaminophen **OR** acetaminophen, clonazePAM, ondansetron (ZOFRAN) IV, oxyCODONE, tamsulosin  Micro Results Recent Results (from the past 240 hour(s))  Culture, blood (Routine x 2)     Status: None (Preliminary result)   Collection Time: 08/13/18  7:20 PM  Result Value Ref Range Status   Specimen Description BLOOD RIGHT ARM  Final   Special Requests   Final    BOTTLES DRAWN AEROBIC AND  ANAEROBIC Blood Culture adequate volume   Culture NO GROWTH 2 DAYS  Final   Report Status PENDING  Incomplete  Culture, blood (Routine x 2)     Status: None (Preliminary result)   Collection Time: 08/13/18  7:40 PM  Result Value Ref Range Status   Specimen Description BLOOD LEFT ARM  Final   Special Requests   Final    BOTTLES DRAWN AEROBIC ONLY Blood Culture results may not be optimal due to an excessive volume of blood received in culture bottles   Culture NO GROWTH 2 DAYS  Final   Report Status PENDING  Incomplete  Urine culture     Status: None   Collection Time: 08/13/18  8:29 PM  Result Value Ref Range Status   Specimen Description URINE, RANDOM  Final   Special Requests NONE  Final   Culture   Final    NO GROWTH Performed at [facility], [place] [address] [place], [place] [address]    Report Status 08/15/2018 FINAL  Final  MRSA PCR Screening     Status: None   Collection Time: 08/15/18 12:09 AM  Result Value Ref Range Status   MRSA by PCR NEGATIVE NEGATIVE Final    Comment:        The GeneXpert MRSA Assay (FDA approved for NASAL specimens only), is one component of a comprehensive MRSA colonization surveillance program. It is not intended to diagnose MRSA infection nor to guide or monitor treatment for MRSA infections. Performed at [facility], [place] [address], [place], [place] [address]     Radiology Reports Ct Abdomen Pelvis Wo Contrast  Result Date: 08/13/2018 CLINICAL DATA:  Groin swelling EXAM: CT ABDOMEN AND PELVIS WITHOUT CONTRAST TECHNIQUE: Multidetector CT imaging of the abdomen and pelvis was performed following the standard protocol without IV contrast. COMPARISON:  MRI 07/10/2018 FINDINGS: Lower chest: Lung bases demonstrate no acute consolidation or effusion. The heart size is normal. Small hiatal hernia. Hepatobiliary: No focal liver abnormality is seen. No gallstones, gallbladder wall thickening, or biliary dilatation. Pancreas: Unremarkable.  No pancreatic ductal dilatation or surrounding inflammatory changes. Spleen: Normal in size without focal abnormality. Adrenals/Urinary Tract: Adrenal glands are within normal limits. No hydronephrosis. Thick-walled urinary bladder Stomach/Bowel: Stomach is nonenlarged. No dilated small bowel. Extensive gas collection Between the rectum and prostate. Vascular/Lymphatic: Mild aortic atherosclerosis. No aneurysm. No significantly enlarged lymph nodes. Reproductive: Metallic densities along the posterior aspect of the prostate. Large gas collections around the prostate gland. Moderate edema of the scrotum and around the penis. Other: No free air. Fat in the left inguinal canal. Edema within the pubic soft tissues. Gas and fluid collection  extending from the rectal area and along both sides of the prostate gland. Musculoskeletal: No acute or suspicious abnormality. IMPRESSION: 1. Moderate edema and mild inflammatory changes involving the scrotum and soft tissues about the penis, but without soft tissue emphysema. Mild edema within the pubic soft tissues. 2. Abnormal gas and fluid collection between the rectum and prostate gland and extending along the right and left aspects of the prostate gland, felt to correspond to previously demonstrated rectal ulcer and sinus tracks. 3. Thick-walled appearance of the urinary bladder, possible cystitis Electronically Signed   By: Donavan Foil M.D.   On: 08/13/2018 23:19   Dg Chest 2 View  Result Date: 08/13/2018 CLINICAL DATA:  Acute onset of genital swelling. Fever. EXAM: CHEST - 2 VIEW COMPARISON:  Chest radiograph performed 07/07/2018 FINDINGS: The lungs are well-aerated. Minimal left basilar atelectasis is noted. There is no evidence of pleural effusion or pneumothorax. The heart is normal in size; the mediastinal contour is within normal limits. No acute osseous abnormalities are seen. IMPRESSION: Minimal left basilar atelectasis noted. Lungs otherwise clear. Electronically  Signed   By: Garald Balding M.D.   On: 08/13/2018 22:16   US Renal  Result Date: 08/14/2018 CLINICAL DATA:  Acute renal failure. EXAM: RENAL / URINARY TRACT ULTRASOUND COMPLETE COMPARISON:  Noncontrast CT yesterday. FINDINGS: Right Kidney: Renal measurements: 12.6 x 5.2 x 6.1 cm = volume: 207 mL . Echogenicity within normal limits. No mass or hydronephrosis visualized. Left Kidney: Renal measurements: 12.8 x 5.1 x 5.7 cm = volume: 195 mL. Echogenicity within normal limits. No mass or hydronephrosis visualized. Bladder: Bladder wall thickening of 9 mm, as seen on CT. IMPRESSION: 1. Bladder wall thickening, similar to CT yesterday. 2. Unremarkable sonographic appearance of both kidneys. No hydronephrosis. Electronically Signed   By: Keith Rake M.D.   On: 08/14/2018 03:48   US Scrotum W/doppler  Result Date: 08/13/2018 CLINICAL DATA:  Initial evaluation for acute testicular pain, swelling. EXAM: SCROTAL ULTRASOUND DOPPLER ULTRASOUND OF THE TESTICLES TECHNIQUE: Complete ultrasound examination of the testicles, epididymis, and other scrotal structures was performed. Color and spectral Doppler ultrasound were also utilized to evaluate blood flow to the testicles. COMPARISON:  None. FINDINGS: Right testicle Measurements: 3.5 x 2.5 x 1.9 cm. No mass lesion. Testicular microlithiasis noted. Left testicle Measurements: 3.9 x 2.2 x 2.3 cm. No mass lesion. Testicular microlithiasis noted. Right epididymis:  Normal in size and appearance. Left epididymis: Diffusely increased vascularity seen within the left epididymis, suggesting acute epididymitis. Hydrocele:  Bilateral hydroceles noted. Varicocele:  Bilateral varicoceles noted. Pulsed Doppler interrogation of both testes demonstrates normal low resistance arterial and venous waveforms bilaterally. Incidental note made of a probable small fat containing right inguinal hernia. IMPRESSION: 1. Increased vascularity within the left epididymis, suggesting acute  epididymitis. No evidence for testicular torsion. 2. Small moderate bilateral hydroceles, likely reactive. 3. Small bilateral varicoceles. 4. Probable small fat containing right inguinal hernia. 5. Testicular microlithiasis. Current literature suggests that testicular microlithiasis is not a significant independent risk factor for development of testicular carcinoma, and that follow up imaging is not warranted in the absence of other risk factors. Monthly testicular self-examination and annual physical exams are considered appropriate surveillance. If patient has other risk factors for testicular carcinoma, then referral to Urology should be considered. (Reference: DeCastro, et al.: A 5-Year Follow up Study of Asymptomatic Men with Testicular Microlithiasis. J Urol 2008; [id number].) Electronically Signed   By: Jeannine Boga M.D.   On: 08/13/2018 22:48   US Abdomen  Limited Ruq  Result Date: 08/14/2018 CLINICAL DATA:  Initial evaluation for elevated AST with leukocytosis. EXAM: ULTRASOUND ABDOMEN LIMITED RIGHT UPPER QUADRANT COMPARISON:  None. FINDINGS: Gallbladder: No gallstones or wall thickening visualized. No sonographic Murphy sign noted by sonographer. Common bile duct: Diameter: 4.6 mm Liver: No focal lesion identified. Within normal limits in parenchymal echogenicity. Portal vein is patent on color Doppler imaging with normal direction of blood flow towards the liver. IMPRESSION: Normal right upper quadrant ultrasound. No cholelithiasis, evidence for acute cholecystitis, or biliary dilatation. Electronically Signed   By: Jeannine Boga M.D.   On: 08/14/2018 03:43     Phillips Climes M.D on 08/16/2018 at 12:23 PM  Between 7am to 7pm - Pager - [phone]  After 7pm go to [email or web address] - password [facility]  [facility] -  Office  [phone]

## 2018-08-16 NOTE — Progress Notes (Signed)
Patient refuses CPAP for the night  

## 2018-08-16 NOTE — Progress Notes (Signed)
Pharmacy Antibiotic Note  Phillip Blackwell is a 58 y.o. male admitted on 08/13/2018 with sepsis. Since this is more of GI/GU related infection, d/w case with Dr. Lucrezia Europe to dc vanc.   Plan:  Cefepime 1 gram IV q24hr   Height: 6' (182.9 cm) Weight: 190 lb (86.2 kg) IBW/kg (Calculated) : 77.6  Temp (24hrs), Avg:99.2 F (37.3 C), Min:98.4 F (36.9 C), Max:99.8 F (37.7 C)  Recent Labs  Lab 08/13/18 2000 08/13/18 2023 08/13/18 2313 08/14/18 0438 08/14/18 0916 08/15/18 0238 08/15/18 0817 08/15/18 1821 08/16/18 0327  WBC 41.7*  --   --  31.8* 28.4* 21.5*  --  17.4* 18.5*  CREATININE 7.23*  --   --  6.38*  --  4.80*  --   --  3.15*  LATICACIDVEN  --  3.80* 1.95*  --   --   --   --   --   --   VANCORANDOM  --   --   --   --   --   --  9  --   --     Estimated Creatinine Clearance: 27.7 mL/min (A) (by C-G formula based on SCr of 3.15 mg/dL (H)).    No Known Allergies  Antimicrobials this admission: Vanc 01/03 >> 1/6 Cefepime 01/03 >> Flagyl 1/3>>  Microbiology Results: 1/3 BCx: NGTD 1/3 UCx: NGF 1/5 MRSA PCR: negative   Onnie Boer, PharmD, BCIDP, AAHIVP, CPP Infectious Disease Pharmacist 08/16/2018 2:45 PM

## 2018-08-16 NOTE — Care Management Note (Addendum)
Case Management Note  Patient Details  Name: Phillip Blackwell MRN: [id number] Date of Birth: 10/26/1960  Subjective/Objective:       Admitted with severe sepsis/ rectal ulcer/colon microperforation, seeding prostate and GU tract, causing epididymitis/prostatitis, and colitis. Hx of anxiety, depression, GERD, hypertension, CVA, OSA. From home with wife.     Phillip Blackwell (Spouse)     [phone]      PCP: [facility], Dr.Tiva ( fax# [phone]), Gisela pager , [phone], office # [phone], [phone]     [facility] [phone]    1/03/19/2019 - Plan: Dr. Tresa Moore planning to debride and cysto in OR today 1/8  Action/Plan: NCM made [facility] ( April [phone], ext [phone]) aware of hospital visit via voicemail... awaiting call back.  NCM following for TOC needs....    Expected Discharge Date:                  Expected Discharge Plan:  Home/Self Care  In-House Referral:     Discharge planning Services     Post Acute Care Choice:    Choice offered to:     DME Arranged:    DME Agency:     HH Arranged:    HH Agency:     Status of Service:  In process, will continue to follow  If discussed at Long Length of Stay Meetings, dates discussed:    Additional Comments:  Sharin Mons, RN 08/16/2018, 10:48 AM

## 2018-08-17 ENCOUNTER — Other Ambulatory Visit: Payer: Self-pay

## 2018-08-17 ENCOUNTER — Inpatient Hospital Stay (HOSPITAL_COMMUNITY): Payer: No Typology Code available for payment source

## 2018-08-17 DIAGNOSIS — R6521 Severe sepsis with septic shock: Secondary | ICD-10-CM

## 2018-08-17 LAB — COMPREHENSIVE METABOLIC PANEL
ALT: 33 U/L (ref 0–44)
AST: 41 U/L (ref 15–41)
Albumin: 1.4 g/dL — ABNORMAL LOW (ref 3.5–5.0)
Alkaline Phosphatase: 83 U/L (ref 38–126)
Anion gap: 11 (ref 5–15)
BILIRUBIN TOTAL: 2.3 mg/dL — AB (ref 0.3–1.2)
BUN: 56 mg/dL — ABNORMAL HIGH (ref 6–20)
CO2: 20 mmol/L — ABNORMAL LOW (ref 22–32)
Calcium: 8 mg/dL — ABNORMAL LOW (ref 8.9–10.3)
Chloride: 104 mmol/L (ref 98–111)
Creatinine, Ser: 2.41 mg/dL — ABNORMAL HIGH (ref 0.61–1.24)
GFR calc Af Amer: 33 mL/min — ABNORMAL LOW (ref >60)
GFR calc non Af Amer: 28 mL/min — ABNORMAL LOW (ref >60)
Glucose, Bld: 96 mg/dL (ref 70–99)
Potassium: 3.8 mmol/L (ref 3.5–5.1)
Sodium: 135 mmol/L (ref 135–145)
TOTAL PROTEIN: 6.3 g/dL — AB (ref 6.5–8.1)

## 2018-08-17 LAB — CBC
HCT: 23.3 % — ABNORMAL LOW (ref 39.0–52.0)
Hemoglobin: 7.4 g/dL — ABNORMAL LOW (ref 13.0–17.0)
MCH: 27.9 pg (ref 26.0–34.0)
MCHC: 31.8 g/dL (ref 30.0–36.0)
MCV: 87.9 fL (ref 80.0–100.0)
Platelets: 406 10*3/uL — ABNORMAL HIGH (ref 150–400)
RBC: 2.65 MIL/uL — AB (ref 4.22–5.81)
RDW: 15.9 % — ABNORMAL HIGH (ref 11.5–15.5)
WBC: 16.2 10*3/uL — ABNORMAL HIGH (ref 4.0–10.5)
nRBC: 0 % (ref 0.0–0.2)

## 2018-08-17 LAB — PREPARE RBC (CROSSMATCH)

## 2018-08-17 MED ORDER — SODIUM CHLORIDE 0.9% IV SOLUTION
Freq: Once | INTRAVENOUS | Status: AC
  Administered 2018-08-17: 12:00:00 via INTRAVENOUS

## 2018-08-17 MED ORDER — SODIUM CHLORIDE 0.9 % IV SOLN
INTRAVENOUS | Status: DC | PRN
  Administered 2018-08-17: 250 mL via INTRAVENOUS
  Administered 2018-08-26: 100 mL via INTRAVENOUS
  Administered 2018-08-29 – 2018-08-30 (×2): 250 mL via INTRAVENOUS

## 2018-08-17 MED ORDER — METRONIDAZOLE 500 MG PO TABS
500.0000 mg | ORAL_TABLET | Freq: Three times a day (TID) | ORAL | Status: DC
  Administered 2018-08-17 – 2018-08-20 (×9): 500 mg via ORAL
  Filled 2018-08-17 (×9): qty 1

## 2018-08-17 MED ORDER — ENSURE ENLIVE PO LIQD
237.0000 mL | Freq: Two times a day (BID) | ORAL | Status: DC
  Administered 2018-08-17: 237 mL via ORAL

## 2018-08-17 NOTE — Progress Notes (Signed)
  [facility] Progress Note    Assessment/ Plan:   Hx of prostate CA with external beam radiation Gleason 6 here with sepsis from prostatitis developing AKI.  1) Acute renal failure secondary to ATN from sepsis - Fortunately the renal function continues to improve. Encouraged the pt to drink if he's thirsty.  Will sign off at this time.  2) OSA - CPAP 3) BPH  4) Full thickness perforation of rectal ulcer/diarrhea  5) Severe sepsis - prostatitis/epididymitis/colitis  WBC slowly trending down  6) Anemia - chronic disease, Has received 2 units, hgb 7.4 today  Subjective:   C/o pain in the lower abd, denies f/c/n/v/dyspnea.   Objective:   BP 129/68   Pulse 98   Temp 98.3 F (36.8 C) (Oral)   Resp 18   Ht 6' (1.829 m)   Wt 86.2 kg   SpO2 95%   BMI 25.77 kg/m   Intake/Output Summary (Last 24 hours) at 08/17/2018 1950 Last data filed at 08/17/2018 0700 Gross per 24 hour  Intake 1205.92 ml  Output 2325 ml  Net -1119.08 ml   Weight change:   Physical Exam: General appearance: alert, cooperative and no distress Lungs: CTA B/L CV: RRR  Male genitalia: normal, ongoing swelling and tendernes.    Abdomen is soft, nondistended, mildly tender lower fields, +BS GU: Foley to gravity  Imaging: No results found.  Labs: BMET Recent Labs  Lab 08/13/18 2000 08/14/18 0438 08/15/18 0238 08/16/18 0327 08/17/18 0506  NA 130* 132* 134* 135 135  K 5.0 4.4 3.6 3.2* 3.8  CL 98 106 105 101 104  CO2 17* 13* 20* 21* 20*  GLUCOSE 157* 151* 145* 94 96  BUN 92* 94* 93* 76* 56*  CREATININE 7.23* 6.38* 4.80* 3.15* 2.41*  CALCIUM 9.0 7.7* 7.8* 7.9* 8.0*   CBC Recent Labs  Lab 08/13/18 2000  08/15/18 0238 08/15/18 1821 08/16/18 0327 08/17/18 0506  WBC 41.7*   < > 21.5* 17.4* 18.5* 16.2*  NEUTROABS 37.4*  --   --   --   --   --   HGB 8.4*   < > 5.6* 8.0* 8.3* 7.4*  HCT 26.9*   < > 16.7* 24.0* 25.1* 23.3*  MCV 93.4   < > 87.9 86.3 86.9 87.9  PLT 284   < > 282  310 321 406*   < > = values in this interval not displayed.    Medications:    . sodium chloride   Intravenous Once  . sodium chloride   Intravenous Once  . atorvastatin  40 mg Oral Q supper  . cholecalciferol  2,000 Units Oral Daily  . ferrous sulfate  325 mg Oral Q breakfast  . metoprolol tartrate  50 mg Oral BID  . multivitamin with minerals  1 tablet Oral Daily  . pantoprazole  40 mg Oral BID AC  . polycarbophil  625 mg Oral BID  . prazosin  2 mg Oral QHS      Otelia Santee, MD 08/17/2018, 8:23 AM

## 2018-08-17 NOTE — Evaluation (Signed)
Physical Therapy Evaluation Patient Details Name: Phillip Blackwell MRN: [id number] DOB: [birth year] Today's Date: 08/17/2018   History of Present Illness  58 y.o. male with medical history significant of anxiety, depression, GERD, hypertension, CVA, OSA presenting to the hospital for evaluation of groin pain and swelling , fever, blood in stool, work-up was significant for sepsis, acute blood loss anemia, is admitted for further work-up, this is secondary to colitis/prostatitis/epididymitis from perforated rectal ulcer  Clinical Impression  Patient presents with decreased independence with mobility due to pain, limited activity tolerance, decreased balance, generalized weakness and he will benefit from skilled PT in the acute setting and follow up CIR level rehab prior to d/c home with wife assist.     Follow Up Recommendations CIR;Supervision/Assistance - 24 hour    Equipment Recommendations  Rolling walker with 5" wheels    Recommendations for Other Services       Precautions / Restrictions Precautions Precautions: Fall      Mobility  Bed Mobility Overal bed mobility: Needs Assistance Bed Mobility: Rolling;Sidelying to Sit Rolling: Min assist Sidelying to sit: Mod assist;HOB elevated;Max assist       General bed mobility comments: cues for technique, increased time, assist for legs off bed and lifting trunk  Transfers Overall transfer level: Needs assistance Equipment used: Rolling walker (2 wheeled) Transfers: Sit to/from [facility] Sit to Stand: Mod assist;Max assist Stand pivot transfers: Min assist;Mod assist       General transfer comment: lifting help from EOB, stood twice due to soiled in bed, assist for toilet hygiene; assist to stand and turn with walker to pivot to chair, limited due to scrotal edema and pain  Ambulation/Gait                Stairs            Wheelchair Mobility    Modified Rankin (Stroke Patients Only)        Balance Overall balance assessment: Needs assistance Sitting-balance support: Feet supported;Bilateral upper extremity supported Sitting balance-Leahy Scale: Poor Sitting balance - Comments: leans back on UE's due to scrotal swelling and pain   Standing balance support: Bilateral upper extremity supported Standing balance-Leahy Scale: Poor Standing balance comment: UE support for balance in standing                              Pertinent Vitals/Pain Pain Assessment: 0-10 Pain Score: 5  Pain Location: groin area Pain Descriptors / Indicators: Sore Pain Intervention(s): Monitored during session;Repositioned;Limited activity within patient's tolerance    Home Living Family/patient expects to be discharged to:: Private residence Living Arrangements: Spouse/significant other Available Help at Discharge: Family Type of Home: House Home Access: Stairs to enter Entrance Stairs-Rails: None Technical brewer of Steps: 4 Home Layout: One level Home Equipment: None      Prior Function Level of Independence: Independent         Comments: recently wife having to help some     Hand Dominance        Extremity/Trunk Assessment   Upper Extremity Assessment Upper Extremity Assessment: Generalized weakness    Lower Extremity Assessment Lower Extremity Assessment: Generalized weakness       Communication   Communication: No difficulties  Cognition Arousal/Alertness: Awake/alert Behavior During Therapy: WFL for tasks assessed/performed Overall Cognitive Status: Impaired/Different from baseline Area of Impairment: Problem solving;Following commands  Following Commands: Follows one step commands with increased time;Follows one step commands consistently     Problem Solving: Slow processing;Decreased initiation;Requires verbal cues;Requires tactile cues        General Comments General comments (skin integrity, edema,  etc.): wife in the room and supportive, but appropriately concerned for his ability to care for himself at home. used pillowcase to provide sling support under swollen testicles    Exercises     Assessment/Plan    PT Assessment Patient needs continued PT services  PT Problem List Decreased strength;Decreased balance;Decreased mobility;Decreased knowledge of precautions;Decreased activity tolerance       PT Treatment Interventions DME instruction;Therapeutic exercise;Balance training;Functional mobility training;Therapeutic activities;Patient/family education;Gait training;Stair training    PT Goals (Current goals can be found in the Care Plan section)  Acute Rehab PT Goals Patient Stated Goal: to get stronger PT Goal Formulation: With patient/family Time For Goal Achievement: 08/31/18 Potential to Achieve Goals: Good    Frequency Min 3X/week   Barriers to discharge        Co-evaluation               AM-PAC PT "6 Clicks" Mobility  Outcome Measure Help needed turning from your back to your side while in a flat bed without using bedrails?: A Little Help needed moving from lying on your back to sitting on the side of a flat bed without using bedrails?: A Lot Help needed moving to and from a bed to a chair (including a wheelchair)?: A Lot Help needed standing up from a chair using your arms (e.g., wheelchair or bedside chair)?: A Lot Help needed to walk in hospital room?: A Lot Help needed climbing 3-5 steps with a railing? : Total 6 Click Score: 12    End of Session Equipment Utilized During Treatment: Gait belt Activity Tolerance: Patient limited by pain Patient left: in chair;with call bell/phone within reach;with family/visitor present Nurse Communication: Mobility status PT Visit Diagnosis: Muscle weakness (generalized) (M62.81);Difficulty in walking, not elsewhere classified (R26.2)    Time: 0277-4128 PT Time Calculation (min) (ACUTE ONLY): 33 min   Charges:    PT Evaluation $PT Eval Moderate Complexity: 1 Mod PT Treatments $Therapeutic Activity: 8-22 mins        Magda Kiel, [place] [facility] [phone] 08/17/2018   Reginia Naas 08/17/2018, 5:29 PM

## 2018-08-17 NOTE — Progress Notes (Signed)
CC:  Severe sepsis  Subjective: No significant change. Reports some diarrhea still, has had 1 bm / day last 2 days  Objective: Vital signs in last 24 hours: Temp:  [98.3 F (36.8 C)-100.4 F (38 C)] 98.3 F (36.8 C) (01/07 0808) Pulse Rate:  [98-103] 98 (01/07 0755) Resp:  [16-25] 18 (01/07 0755) BP: (110-157)/(56-75) 129/68 (01/07 0755) SpO2:  [92 %-95 %] 95 % (01/07 0755) Last BM Date: 08/16/18  RUQ AG:TXMIWO right upper quadrant ultrasound. No cholelithiasis, evidence for acute cholecystitis, or biliary dilatation. CT 1/3:Moderate edema and mild inflammatory changes involving the scrotum and soft tissues about the penis, but without soft tissue emphysema. Mild edema within the pubic soft tissues. 2. Abnormal gas and fluid collection between the rectum and prostate gland and extending along the right and left aspects of the prostate gland, felt to correspond to previously demonstrated rectal ulcer and sinus tracks. 3. Thick-walled appearance of the urinary bladder, possible cystitis  Intake/Output from previous day: 01/06 0701 - 01/07 0700 In: 1205.9 [P.O.:840; I.V.:164.9; IV Piggyback:201] Out: 2325 [Urine:2325] Intake/Output this shift: No intake/output data recorded.  General appearance: alert, cooperative and no distress Male genitalia: normal, ongoing swelling and tendernes.  Foley in place  Abdomen is soft, nondistended, mildly tender lower fields  Lab Results:  Recent Labs    08/16/18 0327 08/17/18 0506  WBC 18.5* 16.2*  HGB 8.3* 7.4*  HCT 25.1* 23.3*  PLT 321 406*    BMET Recent Labs    08/16/18 0327 08/17/18 0506  NA 135 135  K 3.2* 3.8  CL 101 104  CO2 21* 20*  GLUCOSE 94 96  BUN 76* 56*  CREATININE 3.15* 2.41*  CALCIUM 7.9* 8.0*   PT/INR No results for input(s): LABPROT, INR in the last 72 hours.  Recent Labs  Lab 08/13/18 2000 08/14/18 0438 08/15/18 0238 08/16/18 0327 08/17/18 0506  AST 50* 33 35 47* 41  ALT 41 28 26 33 33   ALKPHOS 118 87 128* 88 83  BILITOT 6.4* 5.0* 3.7* 2.9* 2.3*  PROT 7.8 6.1* 5.7* 6.1* 6.3*  ALBUMIN 2.0* 1.5* 1.2* 1.4* 1.4*     Lipase     Component Value Date/Time   LIPASE 33 08/13/2018 2047     Medications: . sodium chloride   Intravenous Once  . sodium chloride   Intravenous Once  . atorvastatin  40 mg Oral Q supper  . cholecalciferol  2,000 Units Oral Daily  . ferrous sulfate  325 mg Oral Q breakfast  . metoprolol tartrate  50 mg Oral BID  . multivitamin with minerals  1 tablet Oral Daily  . pantoprazole  40 mg Oral BID AC  . polycarbophil  625 mg Oral BID  . prazosin  2 mg Oral QHS   . sodium chloride Stopped (08/17/18 0659)  . ceFEPime (MAXIPIME) IV Stopped (08/16/18 2126)  . metronidazole 100 mL/hr at 08/17/18 0700    Assessment/Plan Hx of prostate CA with external beam radiation Gleason 6 Acute renal failure -creatinine improving OSA - CPAP BPH Hiccups x 1 month  Full thickness perforation of rectal ulcer/diarrhea Severe sepsis - prostatitis/epididymitis/colitis  WBC slowly trending down Anemia - chronic disease, Has received 2 units, hgb 7.4 today  FEN: Heart healthy ID: Maxipime 1/3>> day 5  Flagyl 1/3>> day 5 DVT: Anemia/SCD Follow up:  Dr. Eliezer Mccoy  Plan:  Medical management for now.  Follow up with Dr. Marcello Moores or Dr. Dema Severin.         LOS: 3  days    Phillip Riley MD 08/17/2018

## 2018-08-17 NOTE — Progress Notes (Signed)
PHARMACIST - PHYSICIAN COMMUNICATION DR:   Elgergawy CONCERNING: Antibiotic IV to Oral Route Change Policy  RECOMMENDATION: This patient is receiving Flagyl by the intravenous route.  Based on criteria approved by the Pharmacy and Therapeutics Committee, the antibiotic(s) is/are being converted to the equivalent oral dose form(s).   DESCRIPTION: These criteria include:  Patient being treated for a respiratory tract infection, urinary tract infection, cellulitis or clostridium difficile associated diarrhea if on metronidazole  The patient is not neutropenic and does not exhibit a GI malabsorption state  The patient is eating (either orally or via tube) and/or has been taking other orally administered medications for a least 24 hours  The patient is improving clinically and has a Tmax < 100.5  If you have questions about this conversion, please contact the Pharmacy Department  []   [phone] )  [facility] []   [phone] )  [facility] [x]   [phone] )  [facility] []   [phone] )  [facility] []   [phone] )  Thompson, PharmD, [place], AAHIVP, CPP Infectious Disease Pharmacist 08/17/2018 1:04 PM

## 2018-08-17 NOTE — Progress Notes (Addendum)
PROGRESS NOTE                                                                                                                                                                                                             Patient Demographics:    Phillip Blackwell, is a 58 y.o. male, DOB - 29-Dec-1960, YBW:[id number]  Admit date - 08/13/2018   Admitting Physician Shela Leff, MD  Outpatient Primary MD for the patient is System, Pcp Not In  LOS - 3   Chief Complaint  Patient presents with  . Groin Swelling  . Weakness  . Vomiting  . Diarrhea       Brief Narrative    58 y.o. male with medical history significant of anxiety, depression, GERD, hypertension, CVA, OSA presenting to the hospital for evaluation of groin pain and swelling , fever, blood in stool, work-up was significant for sepsis, acute blood loss anemia, is admitted for further work-up, this is secondary to colitis/prostatitis/epididymitis from perforated rectal ulcer, will patient with acute blood loss anemia in the setting of blood loss anemia due to lower GI bleed on admission, required 2 units PRBC transfusion, as well AKI on admission improved with IV fluids.   Subjective:    Chuck Hint today for generalized weakness, fatigue, having fever, T-max 100.4 over last 24 hours, reports bowel movements, soft, with no blood   Assessment  & Plan :    Principal Problem:   Severe sepsis (HCC) Active Problems:   Acute renal failure (ARF) (HCC)   Serum total bilirubin elevated   Hematochezia   Hyponatremia   Normal anion gap metabolic acidosis   Testicular microlithiasis   OSA (obstructive sleep apnea)    Severe sepsis -Sepsis criteria met on admission, leukocytosis of 31.8, lactic acid 1.9, febrile, tachycardic and tachypneic, worsening renal function . -Sepsis stemming from rectal ulcer/colon microperforation, seeding prostate and GU tract, causing epididymitis/prostatitis, and  colitis. -Treated admission with broad-spectrum antibiotic including IV vancomycin, cefepime and Flagyl, IV vancomycin has been stopped giving negative MRSA PCR, 1 out of 4 blood culture growing gram-positive rods, he is already on Flagyl, possibly contaminant, but will continue to follow . so far  urine cultures with no growth -Continue with IV fluids -leukocytosis trending down. -Appears to be having some penile purulent discharge today from the base of the penile shaft  with some more swelling, which indicate worsening of abscess on CT this obtained, not show any worsening findings, but have discussed this finding with Dr. Bess Harvest today, he will reassess patient today for possible need for I&D.  1 out of 4 blood cultures positive for gram-negative rods  -Follow closely, cefepime and Flagyl  Rectal Ulcer with suspected perforation -Patient with known diagnosis of rectal ulcer with pruritic bleeding status post radiation 2018, had a flexible sigmoidoscopy with biopsy in November 2019, negative for malignancy, current CT with evidence of gas in the perirectal area, periprostatic space, this is likely microperforation. -general surgery  input greatly appreciated, continue with IV antibiotics, per general surgery may require colostomy to divert the fecal stream at some point, none emergently.  Colostomy can be avoided  Prostatitis/epididymitis -This is in the setting of rectal ulcer perforation, causing seeding of the prostate and scrotal area, urology input was appreciated, continue with broad-spectrum antibiotic coverage for now, continue with Foley catheter, further recommendation per urology.  Acute renal failure with metabolic acidosis - BUN 92.  Serum creatinine 7.2 on admission, was 1.0 a month ago.   -Likely in the setting of ATN from severe sepsis, with contaminant use of lisinopril -Renal ultrasound with no obstruction -Nephrology input appreciated, with metabolic acidosis, renal function  improving, it is 2.4 today.  Acute blood loss anemia -globin 6.9 on admission, dropped to 5.6 after hydration, he did have some hematochezia secondary to his rectal ulcer, transfused units PRBC, with good response, hemoglobin is down 7.4 ,will give 1 more unit PRBC today. -Having his rectal ulcer with perforation, procedure is recommended per surgery, so we will hold on GI consult.  Hyponatremia -Mild, due to dehydration, improving with IV fluids  Hyperbilirubinemia -This is most likely in the setting of sepsis, especially with the abdomen pelvis showing no acute abnormalities, no right upper quadrant tenderness, . -It is trending down, continue to monitor closely   HTN- tachycardia - resume Metoprolol at 1/2 home dose which may help his sinus tachycardia as well - BP will tolerate it - hold Lisinopril   OSA (obstructive sleep apnea) - CPAP ordered  BPH - Flomax, Prazosin  Hiccups - has had them for 1 month- Thorazine ordered by his psychiatrist (for mood) about a month ago has not helped- d/c Thorazine - ? Due to severe reflux - will double Protonix  Anxiety/ depression - cont Cymbalta, Klonopin- d/c Thorazine  Protein calorie malnutrition -Protein is 1.4, will start on supplements  Code Status : full  Family Communication  : none at bedside  Disposition Plan  : Pending further work up   Consults  :  Urology, renal, General surgery  Procedures  : 2 unitsPRBC transfusion 08/15/2018  DVT Prophylaxis  :  SCD, no chemical anticoagulation in the setting of lower GI bleed.  Lab Results  Component Value Date   PLT 406 (H) 08/17/2018    Antibiotics  :   Anti-infectives (From admission, onward)   Start     Dose/Rate Route Frequency Ordered Stop   08/17/18 1400  metroNIDAZOLE (FLAGYL) tablet 500 mg     500 mg Oral Every 8 hours 08/17/18 1303     08/16/18 2000  ceFEPIme (MAXIPIME) 2 g in sodium chloride 0.9 % 100 mL IVPB     2 g 200 mL/hr over 30 Minutes Intravenous  Every 24 hours 08/16/18 1328     08/15/18 1030  vancomycin (VANCOCIN) 1,250 mg in sodium chloride 0.9 % 250 mL IVPB  1,250 mg 166.7 mL/hr over 90 Minutes Intravenous  Once 08/15/18 0942 08/15/18 1444   08/14/18 2000  ceFEPIme (MAXIPIME) 1 g in sodium chloride 0.9 % 100 mL IVPB  Status:  Discontinued     1 g 200 mL/hr over 30 Minutes Intravenous Every 24 hours 08/13/18 2052 08/16/18 1328   08/13/18 2052  vancomycin variable dose per unstable renal function (pharmacist dosing)  Status:  Discontinued      Does not apply See admin instructions 08/13/18 2052 08/16/18 1442   08/13/18 2045  ceFEPIme (MAXIPIME) 2 g in sodium chloride 0.9 % 100 mL IVPB     2 g 200 mL/hr over 30 Minutes Intravenous  Once 08/13/18 2038 08/13/18 2141   08/13/18 2045  metroNIDAZOLE (FLAGYL) IVPB 500 mg  Status:  Discontinued     500 mg 100 mL/hr over 60 Minutes Intravenous Every 8 hours 08/13/18 2038 08/17/18 1303   08/13/18 2045  vancomycin (VANCOCIN) IVPB 1000 mg/200 mL premix     1,000 mg 200 mL/hr over 60 Minutes Intravenous  Once 08/13/18 2038 08/13/18 2333        Objective:   Vitals:   08/17/18 0754 08/17/18 0755 08/17/18 0808 08/17/18 1155  BP:  129/68  (!) 144/68  Pulse:  98  93  Resp:  18  18  Temp: 99.4 F (37.4 C)  98.3 F (36.8 C) 98.5 F (36.9 C)  TempSrc: Oral  Oral Oral  SpO2:  95%  96%  Weight:      Height:        Wt Readings from Last 3 Encounters:  08/13/18 86.2 kg  07/10/18 97 kg  08/22/16 97.1 kg     Intake/Output Summary (Last 24 hours) at 08/17/2018 1340 Last data filed at 08/17/2018 0700 Gross per 24 hour  Intake 1205.92 ml  Output 2325 ml  Net -1119.08 ml     Physical Exam  Awake Alert, Oriented X 3, No new F.N deficits, Normal affect Symmetrical Chest wall movement, Good air movement bilaterally, CTAB RRR,No Gallops,Rubs or new Murmurs, No Parasternal Heave +ve B.Sounds, Abd Soft, and has some suprapubic tenderness, Worsening penile and scrotal edema, with some  purulent discharge from the ulcer at the shaft of the penis. No Cyanosis, Clubbing or edema, No new Rash or bruise       Data Review:    CBC Recent Labs  Lab 08/13/18 2000  08/14/18 0916 08/15/18 0238 08/15/18 1821 08/16/18 0327 08/17/18 0506  WBC 41.7*   < > 28.4* 21.5* 17.4* 18.5* 16.2*  HGB 8.4*   < > 6.9* 5.6* 8.0* 8.3* 7.4*  HCT 26.9*   < > 21.2* 16.7* 24.0* 25.1* 23.3*  PLT 284   < > 262 282 310 321 406*  MCV 93.4   < > 93.0 87.9 86.3 86.9 87.9  MCH 29.2   < > 30.3 29.5 28.8 28.7 27.9  MCHC 31.2   < > 32.5 33.5 33.3 33.1 31.8  RDW 16.0*   < > 16.3* 16.2* 15.6* 15.9* 15.9*  LYMPHSABS 1.2  --   --   --   --   --   --   MONOABS 2.1*  --   --   --   --   --   --   EOSABS 0.1  --   --   --   --   --   --   BASOSABS 0.1  --   --   --   --   --   --    < > =   values in this interval not displayed.    Chemistries  Recent Labs  Lab 08/13/18 2000 08/14/18 0438 08/15/18 0238 08/16/18 0327 08/17/18 0506  NA 130* 132* 134* 135 135  K 5.0 4.4 3.6 3.2* 3.8  CL 98 106 105 101 104  CO2 17* 13* 20* 21* 20*  GLUCOSE 157* 151* 145* 94 96  BUN 92* 94* 93* 76* 56*  CREATININE 7.23* 6.38* 4.80* 3.15* 2.41*  CALCIUM 9.0 7.7* 7.8* 7.9* 8.0*  AST 50* 33 35 47* 41  ALT 41 28 26 33 33  ALKPHOS 118 87 128* 88 83  BILITOT 6.4* 5.0* 3.7* 2.9* 2.3*   ------------------------------------------------------------------------------------------------------------------ No results for input(s): CHOL, HDL, LDLCALC, TRIG, CHOLHDL, LDLDIRECT in the last 72 hours.  No results found for: HGBA1C ------------------------------------------------------------------------------------------------------------------ No results for input(s): TSH, T4TOTAL, T3FREE, THYROIDAB in the last 72 hours.  Invalid input(s): FREET3 ------------------------------------------------------------------------------------------------------------------ Recent Labs    08/14/18 1605 08/15/18 0238  VITAMINB12  --  505    FOLATE  --  8.1  FERRITIN 635*  --   TIBC  --  195*  IRON  --  10*  RETICCTPCT 1.6  --     Coagulation profile Recent Labs  Lab 08/13/18 2000  INR 1.18    No results for input(s): DDIMER in the last 72 hours.  Cardiac Enzymes No results for input(s): CKMB, TROPONINI, MYOGLOBIN in the last 168 hours.  Invalid input(s): CK ------------------------------------------------------------------------------------------------------------------ No results found for: BNP  Inpatient Medications  Scheduled Meds: . sodium chloride   Intravenous Once  . atorvastatin  40 mg Oral Q supper  . cholecalciferol  2,000 Units Oral Daily  . feeding supplement (ENSURE ENLIVE)  237 mL Oral BID BM  . ferrous sulfate  325 mg Oral Q breakfast  . metoprolol tartrate  50 mg Oral BID  . metroNIDAZOLE  500 mg Oral Q8H  . multivitamin with minerals  1 tablet Oral Daily  . pantoprazole  40 mg Oral BID AC  . polycarbophil  625 mg Oral BID  . prazosin  2 mg Oral QHS   Continuous Infusions: . sodium chloride Stopped (08/17/18 0659)  . ceFEPime (MAXIPIME) IV Stopped (08/16/18 2126)   PRN Meds:.sodium chloride, acetaminophen **OR** acetaminophen, clonazePAM, ondansetron (ZOFRAN) IV, oxyCODONE, tamsulosin  Micro Results Recent Results (from the past 240 hour(s))  Culture, blood (Routine x 2)     Status: None (Preliminary result)   Collection Time: 08/13/18  7:20 PM  Result Value Ref Range Status   Specimen Description BLOOD RIGHT ARM  Final   Special Requests   Final    BOTTLES DRAWN AEROBIC AND ANAEROBIC Blood Culture adequate volume   Culture  Setup Time   Final    GRAM POSITIVE RODS ANAEROBIC BOTTLE ONLY CRITICAL RESULT CALLED TO, READ BACK BY AND VERIFIED WITH: J. MILLEN, PHARMD AT 1615 ON 08/16/18 BY C. JESSUP, MLT.    Culture   Final    CULTURE REINCUBATED FOR BETTER GROWTH Performed at [facility], [address] [place], [place] [address]    Report Status PENDING  Incomplete   Culture, blood (Routine x 2)     Status: None (Preliminary result)   Collection Time: 08/13/18  7:40 PM  Result Value Ref Range Status   Specimen Description BLOOD LEFT ARM  Final   Special Requests   Final    BOTTLES DRAWN AEROBIC ONLY Blood Culture results may not be optimal due to an excessive volume of blood received in culture bottles     Culture   Final    NO GROWTH 4 DAYS Performed at [facility], [address] [place], [place] [address]    Report Status PENDING  Incomplete  Urine culture     Status: None   Collection Time: 08/13/18  8:29 PM  Result Value Ref Range Status   Specimen Description URINE, RANDOM  Final   Special Requests NONE  Final   Culture   Final    NO GROWTH Performed at [facility], [address] [place], [place] [address]    Report Status 08/15/2018 FINAL  Final  MRSA PCR Screening     Status: None   Collection Time: 08/15/18 12:09 AM  Result Value Ref Range Status   MRSA by PCR NEGATIVE NEGATIVE Final    Comment:        The GeneXpert MRSA Assay (FDA approved for NASAL specimens only), is one component of a comprehensive MRSA colonization surveillance program. It is not intended to diagnose MRSA infection nor to guide or monitor treatment for MRSA infections. Performed at [facility], [address] [place],  [address]     Radiology Reports Ct Abdomen Pelvis Wo Contrast  Result Date: 08/13/2018 CLINICAL DATA:  Groin swelling EXAM: CT ABDOMEN AND PELVIS WITHOUT CONTRAST TECHNIQUE: Multidetector CT imaging of the abdomen and pelvis was performed following the standard protocol without IV contrast. COMPARISON:  MRI 07/10/2018 FINDINGS: Lower chest: Lung bases demonstrate no acute consolidation or effusion. The heart size is normal. Small hiatal hernia. Hepatobiliary: No focal liver abnormality is seen. No gallstones, gallbladder wall thickening, or biliary dilatation. Pancreas: Unremarkable. No pancreatic ductal  dilatation or surrounding inflammatory changes. Spleen: Normal in size without focal abnormality. Adrenals/Urinary Tract: Adrenal glands are within normal limits. No hydronephrosis. Thick-walled urinary bladder Stomach/Bowel: Stomach is nonenlarged. No dilated small bowel. Extensive gas collection Between the rectum and prostate. Vascular/Lymphatic: Mild aortic atherosclerosis. No aneurysm. No significantly enlarged lymph nodes. Reproductive: Metallic densities along the posterior aspect of the prostate. Large gas collections around the prostate gland. Moderate edema of the scrotum and around the penis. Other: No free air. Fat in the left inguinal canal. Edema within the pubic soft tissues. Gas and fluid collection extending from the rectal area and along both sides of the prostate gland. Musculoskeletal: No acute or suspicious abnormality. IMPRESSION: 1. Moderate edema and mild inflammatory changes involving the scrotum and soft tissues about the penis, but without soft tissue emphysema. Mild edema within the pubic soft tissues. 2. Abnormal gas and fluid collection between the rectum and prostate gland and extending along the right and left aspects of the prostate gland, felt to correspond to previously demonstrated rectal ulcer and sinus tracks. 3. Thick-walled appearance of the urinary bladder, possible cystitis Electronically Signed   By: Kim  Fujinaga M.D.   On: 08/13/2018 23:19   Dg Chest 2 View  Result Date: 08/13/2018 CLINICAL DATA:  Acute onset of genital swelling. Fever. EXAM: CHEST - 2 VIEW COMPARISON:  Chest radiograph performed 07/07/2018 FINDINGS: The lungs are well-aerated. Minimal left basilar atelectasis is noted. There is no evidence of pleural effusion or pneumothorax. The heart is normal in size; the mediastinal contour is within normal limits. No acute osseous abnormalities are seen. IMPRESSION: Minimal left basilar atelectasis noted. Lungs otherwise clear. Electronically Signed   By: Jeffery   Chang M.D.   On: 08/13/2018 22:16   Ct Pelvis Wo Contrast  Result Date: 08/17/2018 CLINICAL DATA:  Proctitis with micro perforation, now with   abscess at penile shaft, worsening pelvic swelling EXAM: CT PELVIS WITHOUT CONTRAST TECHNIQUE: Multidetector CT imaging of the pelvis was performed following the standard protocol without intravenous contrast. COMPARISON:  CT abdomen/pelvis dated 09/09/2018. MR pelvis dated 07/10/2018. FINDINGS: Motion degraded images. Urinary Tract: Thick-walled bladder with indwelling Foley catheter and nondependent gas. Bowel: Visualized bowel is poorly evaluated but grossly unremarkable. Vascular/Lymphatic: Mild atherosclerotic calcifications of the bilateral iliac vessels. No suspicious pelvic lymphadenopathy. Reproductive: Gas within the bilateral seminal vesicles. Additional gas along the retro prostatic soft tissues (series 3/image 37) and extending along the right pelvic sidewall (series 3/image 31), unchanged. Fiducial radiation markers along the posterior prostate. One of these markers is now displaced posterior to the rectum (series 3/image 29), new from recent CT. Other: Subcutaneous stranding along the anterior lower pelvic wall, left greater than right (series 3/image 32), progressive. Additional fluid/stranding along the proximal penile shaft (series 3/image 54) with diffuse scrotal edema (series 3/image 68). This appearance is compatible with cellulitis. No drainable fluid collection/abscess on CT. Musculoskeletal: Visualized osseous structures are within normal limits. IMPRESSION: Gas along the bilateral seminal vesicles and retro prostatic soft tissues, likely related to known rectal microperforation, unchanged. Associated cellulitis involving the lower pelvic wall, penile shaft, and scrotum. No drainable fluid collection/abscess on CT. Displaced fiducial radiation marker now posterior to the rectum, new from recent CT. Electronically Signed   By: Julian Hy M.D.    On: 08/17/2018 11:34   US Renal  Result Date: 08/14/2018 CLINICAL DATA:  Acute renal failure. EXAM: RENAL / URINARY TRACT ULTRASOUND COMPLETE COMPARISON:  Noncontrast CT yesterday. FINDINGS: Right Kidney: Renal measurements: 12.6 x 5.2 x 6.1 cm = volume: 207 mL . Echogenicity within normal limits. No mass or hydronephrosis visualized. Left Kidney: Renal measurements: 12.8 x 5.1 x 5.7 cm = volume: 195 mL. Echogenicity within normal limits. No mass or hydronephrosis visualized. Bladder: Bladder wall thickening of 9 mm, as seen on CT. IMPRESSION: 1. Bladder wall thickening, similar to CT yesterday. 2. Unremarkable sonographic appearance of both kidneys. No hydronephrosis. Electronically Signed   By: Keith Rake M.D.   On: 08/14/2018 03:48   US Scrotum W/doppler  Result Date: 08/13/2018 CLINICAL DATA:  Initial evaluation for acute testicular pain, swelling. EXAM: SCROTAL ULTRASOUND DOPPLER ULTRASOUND OF THE TESTICLES TECHNIQUE: Complete ultrasound examination of the testicles, epididymis, and other scrotal structures was performed. Color and spectral Doppler ultrasound were also utilized to evaluate blood flow to the testicles. COMPARISON:  None. FINDINGS: Right testicle Measurements: 3.5 x 2.5 x 1.9 cm. No mass lesion. Testicular microlithiasis noted. Left testicle Measurements: 3.9 x 2.2 x 2.3 cm. No mass lesion. Testicular microlithiasis noted. Right epididymis:  Normal in size and appearance. Left epididymis: Diffusely increased vascularity seen within the left epididymis, suggesting acute epididymitis. Hydrocele:  Bilateral hydroceles noted. Varicocele:  Bilateral varicoceles noted. Pulsed Doppler interrogation of both testes demonstrates normal low resistance arterial and venous waveforms bilaterally. Incidental note made of a probable small fat containing right inguinal hernia. IMPRESSION: 1. Increased vascularity within the left epididymis, suggesting acute epididymitis. No evidence for testicular  torsion. 2. Small moderate bilateral hydroceles, likely reactive. 3. Small bilateral varicoceles. 4. Probable small fat containing right inguinal hernia. 5. Testicular microlithiasis. Current literature suggests that testicular microlithiasis is not a significant independent risk factor for development of testicular carcinoma, and that follow up imaging is not warranted in the absence of other risk factors. Monthly testicular self-examination and annual physical exams are considered appropriate surveillance. If patient has other risk  factors for testicular carcinoma, then referral to Urology should be considered. (Reference: DeCastro, et al.: A 5-Year Follow up Study of Asymptomatic Men with Testicular Microlithiasis. J Urol 2008; 322:0254-2706.) Electronically Signed   By: Jeannine Boga M.D.   On: 08/13/2018 22:48   US Abdomen Limited Ruq  Result Date: 08/14/2018 CLINICAL DATA:  Initial evaluation for elevated AST with leukocytosis. EXAM: ULTRASOUND ABDOMEN LIMITED RIGHT UPPER QUADRANT COMPARISON:  None. FINDINGS: Gallbladder: No gallstones or wall thickening visualized. No sonographic Murphy sign noted by sonographer. Common bile duct: Diameter: 4.6 mm Liver: No focal lesion identified. Within normal limits in parenchymal echogenicity. Portal vein is patent on color Doppler imaging with normal direction of blood flow towards the liver. IMPRESSION: Normal right upper quadrant ultrasound. No cholelithiasis, evidence for acute cholecystitis, or biliary dilatation. Electronically Signed   By: Jeannine Boga M.D.   On: 08/14/2018 03:43     Phillips Climes M.D on 08/17/2018 at 1:40 PM  Between 7am to 7pm - Pager - [phone]  After 7pm go to [email or web address] - password [facility]  [facility] -  Office  [phone]

## 2018-08-17 NOTE — Progress Notes (Signed)
Subjective/Chief Complaint:   1 - Prostate Cancer, Likely Recurrent - s/p primary external beam radiation completed 03/2017 in [place] for larger volume Gleason 6 disease. Initial PSA 5.5 and diagnosed 2017 by Dr. Alyson Ingles.  Recent PSA Course: 06/2017 - PSA 1.1 06/2018 - PSA 2.6   2 - Acute Renal Failure - Cr 7.2 / K 5.0 by ER labs 08/14/18 on eval sepsis. [place] and CT w/o hydro or distend bladder. Baseline Cr 1.1 as recently as 07/2018.  3 - Sepsis of GI/GU Origin with Prostatitis / Epididymitis / Colitis / Severe Penoscrotal Infection - lactate 3.8, WBC 42k by Er labs 08/14/18 on eval fevers and malaise. UA unremarkable, UCX negative. [place] 1/3 GPR 1 botte / pending. Scrotal US and Pelvic CT with inflmmation of epidydimis / prostate / peri-rectal gas c/w likely infection stemming from colon microperf seeding prostate and GU tract. No abscesses / fluid collections wtihin prostate or pelvis. Initially had good systemic response to IV ABX but worsened penoscrotal skin swelling and then some purlulent drainage 1/7, Repeat CT 1/7 with some progression of peri-rectal gas and new displacement of prior fiducial marker (confirming hole in rectum) and still no large fluid collections / sub-Q gas and exam with new draining sinus tracts at penoscrotal junction.   4 - Rectal Ulcer/Suspect Perforation - known DX rectal ulcer with periodic bleeding following prostate radiation 2018. Flex-Sig / BX 06/2018 of ulcer edges negative for malignancy. CT 08/14/18 with gas in peri-rectal / peri-prostatic space c/w likely microperforation.   Today " Phillip Blackwell" is seen to re-evaluate pelvic infection. Some new penoscrotal drainage. Cr trending down with good UOP. He is overall feeling a bit better systemically.   Objective: Vital signs in last 24 hours: Temp:  [98.3 F (36.8 C)-100.4 F (38 C)] 98.3 F (36.8 C) (01/07 0808) Pulse Rate:  [98-103] 98 (01/07 0755) Resp:  [16-25] 18 (01/07 0755) BP: (110-157)/(58-75) 129/68  (01/07 0755) SpO2:  [92 %-95 %] 95 % (01/07 0755) Last BM Date: 08/16/18  Intake/Output from previous day: 01/06 0701 - 01/07 0700 In: 1205.9 [P.O.:840; I.V.:164.9; IV Piggyback:201] Out: 2325 [Urine:2325] Intake/Output this shift: No intake/output data recorded.    Constitutional: He appears well-developed.  HENT:  Head: Normocephalic.  Eyes: Pupils are equal, round, and reactive to light.  Neck: Normal range of motion.  Cardiovascular:  Improved, mild regular tachycardia by bedside monitor  Respiratory:  Non-labored on room air GI: Soft. There is no abdominal tenderness. There is no rebound and no guarding.  Genitourinary:    Genitourinary Comments: Significant progression of penoscrotal swelling now with 3 sinus tracts (midline, Rt superioer, left inferior) draining copious pus. Still no crepitus, about 10% of involved skin appears non-viable.  Musculoskeletal: Normal range of motion.  Neurological: He is alert.  Skin: Skin is warm.  Psychiatric: He has a normal mood and affect. His behavior is normal.   Lab Results:  Recent Labs    08/16/18 0327 08/17/18 0506  WBC 18.5* 16.2*  HGB 8.3* 7.4*  HCT 25.1* 23.3*  PLT 321 406*   BMET Recent Labs    08/16/18 0327 08/17/18 0506  NA 135 135  K 3.2* 3.8  CL 101 104  CO2 21* 20*  GLUCOSE 94 96  BUN 76* 56*  CREATININE 3.15* 2.41*  CALCIUM 7.9* 8.0*   PT/INR No results for input(s): LABPROT, INR in the last 72 hours. ABG No results for input(s): PHART, HCO3 in the last 72 hours.  Invalid input(s): PCO2, PO2  Studies/Results: No results found.  Anti-infectives: Anti-infectives (From admission, onward)   Start     Dose/Rate Route Frequency Ordered Stop   08/16/18 2000  ceFEPIme (MAXIPIME) 2 g in sodium chloride 0.9 % 100 mL IVPB     2 g 200 mL/hr over 30 Minutes Intravenous Every 24 hours 08/16/18 1328     08/15/18 1030  vancomycin (VANCOCIN) 1,250 mg in sodium chloride 0.9 % 250 mL IVPB     1,250 mg 166.7  mL/hr over 90 Minutes Intravenous  Once 08/15/18 0942 08/15/18 1444   08/14/18 2000  ceFEPIme (MAXIPIME) 1 g in sodium chloride 0.9 % 100 mL IVPB  Status:  Discontinued     1 g 200 mL/hr over 30 Minutes Intravenous Every 24 hours 08/13/18 2052 08/16/18 1328   08/13/18 2052  vancomycin variable dose per unstable renal function (pharmacist dosing)  Status:  Discontinued      Does not apply See admin instructions 08/13/18 2052 08/16/18 1442   08/13/18 2045  ceFEPIme (MAXIPIME) 2 g in sodium chloride 0.9 % 100 mL IVPB     2 g 200 mL/hr over 30 Minutes Intravenous  Once 08/13/18 2038 08/13/18 2141   08/13/18 2045  metroNIDAZOLE (FLAGYL) IVPB 500 mg     500 mg 100 mL/hr over 60 Minutes Intravenous Every 8 hours 08/13/18 2038     08/13/18 2045  vancomycin (VANCOCIN) IVPB 1000 mg/200 mL premix     1,000 mg 200 mL/hr over 60 Minutes Intravenous  Once 08/13/18 2038 08/13/18 2333      Assessment/Plan:  1 - Prostate Cancer, Likely Recurrent - PSA pattern most c/w active disease, but no signs of metastatic involvement. May warrant tissue sampling via transperineal approach in elective setting. No further diagnostics or intervention this admission.   2 - Acute Renal Failure - Improving, likely pre-renal due to sepsis. No hydro to warrant stentig / neph tubes. Rec continued foley for accurate UOP monitoring, appreciate nephrology comanagement.   3 - Sepsis of GI/GU Origin with Prostatitis / Epididymitis / Colitis / Severe Penoscrotal Infection - Although systemically impriving, his infection is progressing locally with new involvment of penoscrotal skin. Not frank Fornier's by imaging or exam.  Rec OR tomorrow afternoon for I+D / cysto (to rule out GU tract violation / fistula), and multifocal drain placement. This will likely require prologed dressing changes, IV ABX and possibly staged surgical debridement.   I still feel underlying source is rectal perforation. Will make Gen Surg aware in case they  want to perform EUA.   Risks, benefits, alternatives, goals, need for possible staged approach discussed.   4 - Rectal Ulcer/Suspect Perforation - likely from progression of known radiation-induced rectal ulcer, very well may ultimately need GI and possible eventual GU diversion (if still has problems despite GI diversion). Appreciate gen-surg opinion.    Alexis Frock 08/17/2018

## 2018-08-17 NOTE — Progress Notes (Signed)
Patient refuses CPAP for the night  

## 2018-08-17 NOTE — Progress Notes (Signed)
Initial Nutrition Assessment  DOCUMENTATION CODES:   Non-severe (moderate) malnutrition in context of chronic illness  INTERVENTION:   - Continue MVI with minerals daily  - Ensure Enlive po BID, each supplement provides 350 kcal and 20 grams of protein  - Liberalize diet to Regular (verbal with readback order placed per MD)  - Encourage adequate PO intake  NUTRITION DIAGNOSIS:   Moderate Malnutrition related to chronic illness (recurrent prostate cancer s/p radiation, chronic rectal ulcer/perforation) as evidenced by mild fat depletion, mild muscle depletion, percent weight loss (11.1% weight loss in less than 2 months).  GOAL:   Patient will meet greater than or equal to 90% of their needs  MONITOR:   PO intake, Supplement acceptance, I & O's, Weight trends, Labs  REASON FOR ASSESSMENT:   Malnutrition Screening Tool    ASSESSMENT:   58 year old male who presented to the ED on 1/3 with groin swelling, N/V, and diarrhea. PMH significant for anxiety, depression, CVA, GERD, recurrent prostate cancer s/p radiation in 2018, chronic rectal ulcer/perforation,  HTN. Pt admitted with Code Sepsis secondary to acute epididymitis and AKI.  1/5 - 2 units PRBC d/t hemoglobin 5.6  Spoke with pt and wife at bedside. Pt reports eating "okay" at breakfast and having muffin and oatmeal.  Pt reports that his appetite was poor on admission and for a week PTA but that it is now improving. Pt states that when he is feeling well, he eats 2 times daily. A meal may include eggs or pasta.  Pt endorses recently weight loss and reports his UBW as 195 lbs. Pt believes he has lost down to 190 lbs now. Weight history in chart shows a 10.8 kg weight loss since 07/10/18. However, weight on admission appears to be stated rather than measured. If accurate, this is an 11.1% weight loss in less than 2 months which is significant for timeframe.  Wt Readings from Last 2 Encounters:  08/13/18 86.2 kg  07/10/18  97 kg   Pt states that he is willing to try Ensure Enlive to aid in meeting kcal and protein needs.  Discussed liberalizing diet with MD. MD approved; verbal with readback order placed.  Meal Completion: 75%  Medications reviewed and include: vitamin D-3 2000 units daily, ferrous sulfate 325 mg daily, MVI with minerals daily, Protonix, Fibercon, IV antibiotics  Labs reviewed: BUN 56 (H), creatinine 2.41 (H), hemoglobin 7.4 (L)  UOP: 2325 ml x 24 hours  NUTRITION - FOCUSED PHYSICAL EXAM:    Most Recent Value  Orbital Region  Mild depletion  Upper Arm Region  Mild depletion  Thoracic and Lumbar Region  Mild depletion  Buccal Region  Mild depletion  Temple Region  No depletion  Clavicle Bone Region  Mild depletion  Clavicle and Acromion Bone Region  Mild depletion  Scapular Bone Region  No depletion  Dorsal Hand  No depletion  Patellar Region  No depletion  Anterior Thigh Region  Mild depletion  Posterior Calf Region  Mild depletion  Edema (RD Assessment)  None  Hair  Reviewed  Eyes  Reviewed  Mouth  Reviewed  Skin  Reviewed  Nails  Reviewed       Diet Order:   Diet Order            Diet regular Room service appropriate? Yes; Fluid consistency: Thin  Diet effective now              EDUCATION NEEDS:   Education needs have been addressed  Skin:  Skin  Assessment: Reviewed RN Assessment  Last BM:  1/6 (medium)  Height:   Ht Readings from Last 1 Encounters:  08/13/18 6' (1.829 m)    Weight:   Wt Readings from Last 1 Encounters:  08/13/18 86.2 kg    Ideal Body Weight:  80.9 kg  BMI:  Body mass index is 25.77 kg/m.  Estimated Nutritional Needs:   Kcal:  2100-2300  Protein:  100-115 grams  Fluid:  >/= 2.1 L    Gaynell Face, MS, RD, LDN Inpatient Clinical Dietitian Pager: [phone] Weekend/After Hours: [phone]

## 2018-08-17 NOTE — Progress Notes (Signed)
Rehab Admissions Coordinator Note:  Patient was screened by Cleatrice Burke for appropriateness for an Inpatient Acute Rehab Consult per PT recs.  At this time, we are recommending Inpatient Rehab consult.  Cleatrice Burke RN MSN 08/17/2018, 7:42 PM  I can be reached at [phone].

## 2018-08-18 ENCOUNTER — Inpatient Hospital Stay (HOSPITAL_COMMUNITY): Payer: No Typology Code available for payment source | Admitting: Certified Registered Nurse Anesthetist

## 2018-08-18 ENCOUNTER — Encounter (HOSPITAL_COMMUNITY)
Admission: EM | Disposition: A | Discharge status: Home Health | Payer: Self-pay | Source: Home / Self Care | Attending: Internal Medicine

## 2018-08-18 DIAGNOSIS — N179 Acute kidney failure, unspecified: Secondary | ICD-10-CM

## 2018-08-18 DIAGNOSIS — R17 Unspecified jaundice: Secondary | ICD-10-CM

## 2018-08-18 DIAGNOSIS — R6510 Systemic inflammatory response syndrome (SIRS) of non-infectious origin without acute organ dysfunction: Secondary | ICD-10-CM | POA: Diagnosis present

## 2018-08-18 HISTORY — PX: CYSTOSCOPY: SHX5120

## 2018-08-18 HISTORY — PX: IRRIGATION AND DEBRIDEMENT ABSCESS: SHX5252

## 2018-08-18 LAB — TYPE AND SCREEN
ABO/RH(D): O POS
Antibody Screen: NEGATIVE
Unit division: 0
Unit division: 0
Unit division: 0

## 2018-08-18 LAB — CULTURE, BLOOD (ROUTINE X 2): Culture: NO GROWTH

## 2018-08-18 LAB — BASIC METABOLIC PANEL
Anion gap: 10 (ref 5–15)
BUN: 46 mg/dL — ABNORMAL HIGH (ref 6–20)
CO2: 21 mmol/L — AB (ref 22–32)
Calcium: 8 mg/dL — ABNORMAL LOW (ref 8.9–10.3)
Chloride: 104 mmol/L (ref 98–111)
Creatinine, Ser: 2.01 mg/dL — ABNORMAL HIGH (ref 0.61–1.24)
GFR calc Af Amer: 41 mL/min — ABNORMAL LOW (ref >60)
GFR calc non Af Amer: 35 mL/min — ABNORMAL LOW (ref >60)
Glucose, Bld: 86 mg/dL (ref 70–99)
POTASSIUM: 4.5 mmol/L (ref 3.5–5.1)
Sodium: 135 mmol/L (ref 135–145)

## 2018-08-18 LAB — CBC
HCT: 24.2 % — ABNORMAL LOW (ref 39.0–52.0)
HEMOGLOBIN: 8.1 g/dL — AB (ref 13.0–17.0)
MCH: 30 pg (ref 26.0–34.0)
MCHC: 33.5 g/dL (ref 30.0–36.0)
MCV: 89.6 fL (ref 80.0–100.0)
Platelets: 423 10*3/uL — ABNORMAL HIGH (ref 150–400)
RBC: 2.7 MIL/uL — ABNORMAL LOW (ref 4.22–5.81)
RDW: 16.1 % — ABNORMAL HIGH (ref 11.5–15.5)
WBC: 14.9 10*3/uL — ABNORMAL HIGH (ref 4.0–10.5)
nRBC: 0 % (ref 0.0–0.2)

## 2018-08-18 LAB — BPAM RBC
BLOOD PRODUCT EXPIRATION DATE: 202001292359
Blood Product Expiration Date: 202001292359
Blood Product Expiration Date: 202001302359
ISSUE DATE / TIME: 202001050828
ISSUE DATE / TIME: 202001051235
ISSUE DATE / TIME: 202001071214
Unit Type and Rh: 5100
Unit Type and Rh: 5100
Unit Type and Rh: 5100

## 2018-08-18 LAB — SURGICAL PCR SCREEN
MRSA, PCR: NEGATIVE
Staphylococcus aureus: POSITIVE — AB

## 2018-08-18 LAB — GLUCOSE, CAPILLARY: Glucose-Capillary: 100 mg/dL — ABNORMAL HIGH (ref 70–99)

## 2018-08-18 LAB — BRAIN NATRIURETIC PEPTIDE: B Natriuretic Peptide: 219.6 pg/mL — ABNORMAL HIGH (ref 0.0–100.0)

## 2018-08-18 SURGERY — CYSTOSCOPY
Anesthesia: General | Site: Perineum

## 2018-08-18 MED ORDER — DEXAMETHASONE SODIUM PHOSPHATE 10 MG/ML IJ SOLN
INTRAMUSCULAR | Status: DC | PRN
  Administered 2018-08-18: 8 mg via INTRAVENOUS

## 2018-08-18 MED ORDER — SODIUM CHLORIDE 0.9 % IR SOLN
Status: DC | PRN
  Administered 2018-08-18 (×2): 3000 mL

## 2018-08-18 MED ORDER — LIDOCAINE 2% (20 MG/ML) 5 ML SYRINGE
INTRAMUSCULAR | Status: DC | PRN
  Administered 2018-08-18: 100 mg via INTRAVENOUS

## 2018-08-18 MED ORDER — LACTATED RINGERS IV SOLN: INTRAVENOUS | Status: DC

## 2018-08-18 MED ORDER — ESMOLOL HCL 100 MG/10ML IV SOLN: INTRAVENOUS | Status: DC | PRN

## 2018-08-18 MED ORDER — SUCCINYLCHOLINE CHLORIDE 20 MG/ML IJ SOLN
INTRAMUSCULAR | Status: DC | PRN
  Administered 2018-08-18: 140 mg via INTRAVENOUS

## 2018-08-18 MED ORDER — IOPAMIDOL (ISOVUE-300) INJECTION 61%
INTRAVENOUS | Status: AC
  Filled 2018-08-18: qty 50

## 2018-08-18 MED ORDER — LACTATED RINGERS IV SOLN
INTRAVENOUS | Status: DC | PRN
  Administered 2018-08-18: 18:00:00 via INTRAVENOUS

## 2018-08-18 MED ORDER — CHLORHEXIDINE GLUCONATE CLOTH 2 % EX PADS
6.0000 | MEDICATED_PAD | Freq: Every day | CUTANEOUS | Status: AC
  Administered 2018-08-18 – 2018-08-22 (×5): 6 via TOPICAL

## 2018-08-18 MED ORDER — ONDANSETRON HCL 4 MG/2ML IJ SOLN
INTRAMUSCULAR | Status: AC
  Filled 2018-08-18: qty 2

## 2018-08-18 MED ORDER — ESMOLOL HCL 100 MG/10ML IV SOLN
INTRAVENOUS | Status: DC | PRN
  Administered 2018-08-18: 30 mg via INTRAVENOUS

## 2018-08-18 MED ORDER — DEXAMETHASONE SODIUM PHOSPHATE 10 MG/ML IJ SOLN
INTRAMUSCULAR | Status: AC
  Filled 2018-08-18: qty 1

## 2018-08-18 MED ORDER — ACETAMINOPHEN 10 MG/ML IV SOLN
INTRAVENOUS | Status: AC
  Filled 2018-08-18: qty 100

## 2018-08-18 MED ORDER — FENTANYL CITRATE (PF) 100 MCG/2ML IJ SOLN
INTRAMUSCULAR | Status: DC | PRN
  Administered 2018-08-18 (×2): 50 ug via INTRAVENOUS
  Administered 2018-08-18 (×3): 25 ug via INTRAVENOUS
  Administered 2018-08-18: 200 ug via INTRAVENOUS

## 2018-08-18 MED ORDER — LIDOCAINE 2% (20 MG/ML) 5 ML SYRINGE
INTRAMUSCULAR | Status: AC
  Filled 2018-08-18: qty 5

## 2018-08-18 MED ORDER — ONDANSETRON HCL 4 MG/2ML IJ SOLN
INTRAMUSCULAR | Status: DC | PRN
  Administered 2018-08-18: 4 mg via INTRAVENOUS

## 2018-08-18 MED ORDER — PROPOFOL 10 MG/ML IV BOLUS
INTRAVENOUS | Status: DC | PRN
  Administered 2018-08-18: 110 mg via INTRAVENOUS

## 2018-08-18 MED ORDER — FENTANYL CITRATE (PF) 100 MCG/2ML IJ SOLN
INTRAMUSCULAR | Status: AC
  Administered 2018-08-18: 50 ug via INTRAVENOUS
  Filled 2018-08-18: qty 2

## 2018-08-18 MED ORDER — FENTANYL CITRATE (PF) 250 MCG/5ML IJ SOLN
INTRAMUSCULAR | Status: AC
  Filled 2018-08-18: qty 5

## 2018-08-18 MED ORDER — MIDAZOLAM HCL 5 MG/5ML IJ SOLN
INTRAMUSCULAR | Status: DC | PRN
  Administered 2018-08-18: 2 mg via INTRAVENOUS

## 2018-08-18 MED ORDER — PROPOFOL 10 MG/ML IV BOLUS
INTRAVENOUS | Status: AC
  Filled 2018-08-18: qty 20

## 2018-08-18 MED ORDER — ACETAMINOPHEN 10 MG/ML IV SOLN
INTRAVENOUS | Status: DC | PRN
  Administered 2018-08-18: 1000 mg via INTRAVENOUS

## 2018-08-18 MED ORDER — MUPIROCIN 2 % EX OINT
1.0000 "application " | TOPICAL_OINTMENT | Freq: Two times a day (BID) | CUTANEOUS | Status: AC
  Administered 2018-08-18 – 2018-08-22 (×9): 1 via NASAL
  Filled 2018-08-18 (×2): qty 22

## 2018-08-18 MED ORDER — MIDAZOLAM HCL 2 MG/2ML IJ SOLN
INTRAMUSCULAR | Status: AC
  Filled 2018-08-18: qty 2

## 2018-08-18 MED ORDER — FENTANYL CITRATE (PF) 100 MCG/2ML IJ SOLN
25.0000 ug | INTRAMUSCULAR | Status: DC | PRN
  Administered 2018-08-18 (×2): 50 ug via INTRAVENOUS

## 2018-08-18 SURGICAL SUPPLY — 34 items
BAG DRAIN URO-CYSTO SKYTR STRL (DRAIN) ×4 IMPLANT
BAG DRN UROCATH (DRAIN) ×2
BAG URO CATCHER STRL LF (MISCELLANEOUS) ×4 IMPLANT
BASKET LASER NITINOL 1.9FR (BASKET) ×4 IMPLANT
BSKT STON RTRVL 120 1.9FR (BASKET) ×2
CATH INTERMIT  6FR 70CM (CATHETERS) IMPLANT
COVER WAND RF STERILE (DRAPES) ×4 IMPLANT
DRAIN PENROSE 1/2X12 LTX STRL (WOUND CARE) ×6 IMPLANT
ELECT REM PT RETURN 9FT ADLT (ELECTROSURGICAL)
ELECTRODE REM PT RTRN 9FT ADLT (ELECTROSURGICAL) IMPLANT
GLOVE BIO SURGEON STRL SZ7.5 (GLOVE) ×4 IMPLANT
GOWN SRG XL LVL 4 BRTHBL STRL (GOWNS) ×2 IMPLANT
GOWN STRL NON-REIN XL LVL4 (GOWNS) ×4
GOWN STRL REUS W/TWL LRG LVL3 (GOWN DISPOSABLE) ×4 IMPLANT
GUIDEWIRE ANG ZIPWIRE 038X150 (WIRE) ×2 IMPLANT
GUIDEWIRE STR DUAL SENSOR (WIRE) IMPLANT
HANDPIECE INTERPULSE COAX TIP (DISPOSABLE) ×4
INFUSOR MANOMETER BAG 3000ML (MISCELLANEOUS) ×2 IMPLANT
IV NS 1000ML (IV SOLUTION) ×4
IV NS 1000ML BAXH (IV SOLUTION) ×2 IMPLANT
IV NS IRRIG 3000ML ARTHROMATIC (IV SOLUTION) ×6 IMPLANT
KIT TURNOVER KIT B (KITS) ×4 IMPLANT
MANIFOLD NEPTUNE II (INSTRUMENTS) ×12 IMPLANT
MANIFOLD NEPTUNE WASTE (CANNULA) ×4 IMPLANT
PACK CYSTO (CUSTOM PROCEDURE TRAY) ×4 IMPLANT
SET HNDPC FAN SPRY TIP SCT (DISPOSABLE) IMPLANT
SET IRRIG Y TYPE TUR BLADDER L (SET/KITS/TRAYS/PACK) ×4 IMPLANT
SOL PREP POV-IOD 4OZ 10% (MISCELLANEOUS) ×4 IMPLANT
SYR 10ML LL (SYRINGE) IMPLANT
TOWEL GREEN STERILE (TOWEL DISPOSABLE) ×4 IMPLANT
TRAY FOLEY SLVR 16FR TEMP STAT (SET/KITS/TRAYS/PACK) ×2 IMPLANT
TUBE CONNECTING 12'X1/4 (SUCTIONS) ×2
TUBE CONNECTING 12X1/4 (SUCTIONS) ×4 IMPLANT
TUBE FEEDING 8FR 16IN STR KANG (MISCELLANEOUS) IMPLANT

## 2018-08-18 NOTE — Progress Notes (Signed)
RT Note:  Patient still refusing CPAP. 

## 2018-08-18 NOTE — Anesthesia Postprocedure Evaluation (Signed)
Anesthesia Post Note  Patient: TYMON NEMETZ  Procedure(s) Performed: CYSTOSCOPY (N/A Penis) IRRIGATION AND DEBRIDEMENT penal scrotal ABSCESS (N/A Perineum)     Patient location during evaluation: PACU Anesthesia Type: General Level of consciousness: awake Pain management: pain level controlled Vital Signs Assessment: post-procedure vital signs reviewed and stable Respiratory status: spontaneous breathing Cardiovascular status: stable Postop Assessment: no apparent nausea or vomiting Anesthetic complications: no    Last Vitals:  Vitals:   08/18/18 1950 08/18/18 2015  BP: (!) 144/75 (!) 153/73  Pulse: (!) 104 94  Resp: (!) 22 19  Temp:  36.5 C  SpO2: 100% 100%    Last Pain:  Vitals:   08/18/18 2015  TempSrc:   PainSc: 8                  Maeve Debord

## 2018-08-18 NOTE — Progress Notes (Signed)
Thank you for consult on Phillip Blackwell. Chart reviewed and note that patient has had progressive worsening of infection with perforation of rectal ulcer and purulent drainage due to penoscrotal infection. Note plans for surgery today as well as questions regarding need for diverting colostomy pending outcomes. Will defer CIR consult for now and monitor at a distance.

## 2018-08-18 NOTE — Anesthesia Preprocedure Evaluation (Signed)
Anesthesia Evaluation  Patient identified by MRN, date of birth, ID band Patient awake    Reviewed: Allergy & Precautions, NPO status , Patient's Chart, lab work & pertinent test results  Airway Mallampati: I  TM Distance: >3 FB Neck ROM: Full    Dental   Pulmonary sleep apnea ,    Pulmonary exam normal        Cardiovascular hypertension, Pt. on medications Normal cardiovascular exam     Neuro/Psych Anxiety Depression    GI/Hepatic   Endo/Other    Renal/GU      Musculoskeletal   Abdominal   Peds  Hematology   Anesthesia Other Findings   Reproductive/Obstetrics                             Anesthesia Physical Anesthesia Plan  ASA: III and emergent  Anesthesia Plan: General   Post-op Pain Management:    Induction: Intravenous, Rapid sequence and Cricoid pressure planned  PONV Risk Score and Plan: 2 and Ondansetron  Airway Management Planned: Oral ETT  Additional Equipment:   Intra-op Plan:   Post-operative Plan: Extubation in OR  Informed Consent: I have reviewed the patients History and Physical, chart, labs and discussed the procedure including the risks, benefits and alternatives for the proposed anesthesia with the patient or authorized representative who has indicated his/her understanding and acceptance.     Plan Discussed with: CRNA and Surgeon  Anesthesia Plan Comments:         Anesthesia Quick Evaluation

## 2018-08-18 NOTE — Brief Op Note (Signed)
08/18/2018  6:35 PM  PATIENT:  Phillip Blackwell  58 y.o. male  PRE-OPERATIVE DIAGNOSIS:  penal scrotal abscess  POST-OPERATIVE DIAGNOSIS:  penal scrotal abscess  PROCEDURE:  Procedure(s): CYSTOSCOPY (N/A) IRRIGATION AND DEBRIDEMENT penal scrotal ABSCESS (N/A)  SURGEON:  Surgeon(s) and Role:    * Alexis Frock, MD - Primary  PHYSICIAN ASSISTANT:   ASSISTANTS: none   ANESTHESIA:   general  EBL:  100 mL   BLOOD ADMINISTERED:none  DRAINS: penrose to wound driange, foley to gravity   LOCAL MEDICATIONS USED:  NONE  SPECIMEN:  Source of Specimen:  tissue cultre, swab - penoscrotal wound  DISPOSITION OF SPECIMEN:  PATHOLOGY  COUNTS:  YES  TOURNIQUET:  * No tourniquets in log *  DICTATION: .Other Dictation: Dictation Number [id number]  PLAN OF CARE: Admit to inpatient   PATIENT DISPOSITION:  PACU - hemodynamically stable.   Delay start of Pharmacological VTE agent (>24hrs) due to surgical blood loss or risk of bleeding: yes

## 2018-08-18 NOTE — Progress Notes (Signed)
PROGRESS NOTE        PATIENT DETAILS Name: Phillip Blackwell Age: 58 y.o. Sex: male Date of Birth: 1960-12-04 Admit Date: 08/13/2018 Admitting Physician Shela Leff, MD [facility]:[id number], Pcp Not In  Brief Narrative: Patient is a 58 y.o. male with history of hypertension, BPH, OSA prostate cancer status post external beam radiation-presented with sepsis secondary to rectal ulcer with perforation with seeding of the GU tract causing epididymitis/prostatitis.  Further hospital course complicated by worsening penile/scrotal infection and AKI.  See below for further details  Subjective: Continues to have purulent drainage from his penoscrotal area this morning.  Denies any chest pain or shortness of breath.  Assessment/Plan: Severe sepsis secondary to perforated rectal ulcer with prostatitis/epididymitis and now worsening penoscrotal infection: Sepsis pathophysiology has significantly improved-but continues to have worsening local penoscrotal infection.  Continue antimicrobial therapy-urology planning I&D later today.  General surgery following.1/2 blood culture positive for gram-positive rod-awaiting final sensitivities-but likely a contaminant  AKI: Thought to be hemodynamically mediated-likely ATN from sepsis.  Improving-follow electrolytes periodically  Acute blood loss anemia: Did have hematochezia secondary to rectal ulcer-transfused 3 units of PRBC.  Hemoglobin stable-continue to follow.  Hyperbilirubinemia: Appears to be mostly direct hyperbilirubinemia-no biliary dilatation seen on most recent CT scan.  Appears to be downtrending-continue to follow bilirubin.  Hypertension: Controlled-continue metoprolol and prazosin.  Dyslipidemia: Continue statin  BPH: Continue Flomax  OSA: CPAP nightly  Moderate protein calorie malnutrition: Continue supplements  Acute debility/deconditioning: Likely secondary to acute illness-CIR consultation placed.  DVT  Prophylaxis: SCD's  Code Status: Full code  Family Communication: None at bedside  Disposition Plan: Remain inpatient-requires several more days of hospitalization before consideration of discharge  Antimicrobial agents: Anti-infectives (From admission, onward)   Start     Dose/Rate Route Frequency Ordered Stop   08/17/18 1400  metroNIDAZOLE (FLAGYL) tablet 500 mg     500 mg Oral Every 8 hours 08/17/18 1303     08/16/18 2000  ceFEPIme (MAXIPIME) 2 g in sodium chloride 0.9 % 100 mL IVPB     2 g 200 mL/hr over 30 Minutes Intravenous Every 24 hours 08/16/18 1328     08/15/18 1030  vancomycin (VANCOCIN) 1,250 mg in sodium chloride 0.9 % 250 mL IVPB     1,250 mg 166.7 mL/hr over 90 Minutes Intravenous  Once 08/15/18 0942 08/15/18 1444   08/14/18 2000  ceFEPIme (MAXIPIME) 1 g in sodium chloride 0.9 % 100 mL IVPB  Status:  Discontinued     1 g 200 mL/hr over 30 Minutes Intravenous Every 24 hours 08/13/18 2052 08/16/18 1328   08/13/18 2052  vancomycin variable dose per unstable renal function (pharmacist dosing)  Status:  Discontinued      Does not apply See admin instructions 08/13/18 2052 08/16/18 1442   08/13/18 2045  ceFEPIme (MAXIPIME) 2 g in sodium chloride 0.9 % 100 mL IVPB     2 g 200 mL/hr over 30 Minutes Intravenous  Once 08/13/18 2038 08/13/18 2141   08/13/18 2045  metroNIDAZOLE (FLAGYL) IVPB 500 mg  Status:  Discontinued     500 mg 100 mL/hr over 60 Minutes Intravenous Every 8 hours 08/13/18 2038 08/17/18 1303   08/13/18 2045  vancomycin (VANCOCIN) IVPB 1000 mg/200 mL premix     1,000 mg 200 mL/hr over 60 Minutes Intravenous  Once 08/13/18 2038 08/13/18 2333  Procedures: None  CONSULTS:  general surgery and urology  Time spent: 25 minutes-Greater than 50% of this time was spent in counseling, explanation of diagnosis, planning of further management, and coordination of care.  MEDICATIONS: Scheduled Meds: . sodium chloride   Intravenous Once  . atorvastatin   40 mg Oral Q supper  . cholecalciferol  2,000 Units Oral Daily  . feeding supplement (ENSURE ENLIVE)  237 mL Oral BID BM  . ferrous sulfate  325 mg Oral Q breakfast  . metoprolol tartrate  50 mg Oral BID  . metroNIDAZOLE  500 mg Oral Q8H  . multivitamin with minerals  1 tablet Oral Daily  . pantoprazole  40 mg Oral BID AC  . polycarbophil  625 mg Oral BID  . prazosin  2 mg Oral QHS   Continuous Infusions: . sodium chloride Stopped (08/17/18 0659)  . ceFEPime (MAXIPIME) IV 2 g (08/17/18 2025)   PRN Meds:.sodium chloride, acetaminophen **OR** acetaminophen, clonazePAM, ondansetron (ZOFRAN) IV, oxyCODONE, tamsulosin   PHYSICAL EXAM: Vital signs: Vitals:   08/18/18 0000 08/18/18 0414 08/18/18 0530 08/18/18 0947  BP:    (!) 150/76  Pulse:    100  Resp:    19  Temp: 100 F (37.8 C) 100.1 F (37.8 C) 99.8 F (37.7 C) 99.3 F (37.4 C)  TempSrc:   Oral Oral  SpO2:      Weight:      Height:       Filed Weights   08/13/18 1922 08/13/18 2234  Weight: 86.2 kg 86.2 kg   Body mass index is 25.77 kg/m.   General appearance :Awake, alert, not in any distress.  HEENT: Atraumatic and Normocephalic Neck: supple Resp:Good air entry bilaterally, no added sounds  CVS: S1 S2 regular GI: Bowel sounds present, Non tender and not distended with no gaurding, rigidity or rebound.No organomegaly Extremities: B/L Lower Ext shows no edema, both legs are warm to touch Neurology: Non focal Musculoskeletal:No digital cyanosis Skin:No Rash, warm and dry Wounds:N/A  I have personally reviewed following labs and imaging studies  LABORATORY DATA: CBC: Recent Labs  Lab 08/13/18 2000  08/15/18 0238 08/15/18 1821 08/16/18 0327 08/17/18 0506 08/18/18 0359  WBC 41.7*   < > 21.5* 17.4* 18.5* 16.2* 14.9*  NEUTROABS 37.4*  --   --   --   --   --   --   HGB 8.4*   < > 5.6* 8.0* 8.3* 7.4* 8.1*  HCT 26.9*   < > 16.7* 24.0* 25.1* 23.3* 24.2*  MCV 93.4   < > 87.9 86.3 86.9 87.9 89.6  PLT 284   < >  282 310 321 406* 423*   < > = values in this interval not displayed.    Basic Metabolic Panel: Recent Labs  Lab 08/14/18 0438 08/15/18 0238 08/16/18 0327 08/17/18 0506 08/18/18 0359  NA 132* 134* 135 135 135  K 4.4 3.6 3.2* 3.8 4.5  CL 106 105 101 104 104  CO2 13* 20* 21* 20* 21*  GLUCOSE 151* 145* 94 96 86  BUN 94* 93* 76* 56* 46*  CREATININE 6.38* 4.80* 3.15* 2.41* 2.01*  CALCIUM 7.7* 7.8* 7.9* 8.0* 8.0*    GFR: Estimated Creatinine Clearance: 43.4 mL/min (A) (by C-G formula based on SCr of 2.01 mg/dL (H)).  Liver Function Tests: Recent Labs  Lab 08/13/18 2000 08/14/18 0438 08/15/18 0238 08/16/18 0327 08/17/18 0506  AST 50* 33 35 47* 41  ALT 41 28 26 33 33  ALKPHOS 118 87 128* 88  83  BILITOT 6.4* 5.0* 3.7* 2.9* 2.3*  PROT 7.8 6.1* 5.7* 6.1* 6.3*  ALBUMIN 2.0* 1.5* 1.2* 1.4* 1.4*   Recent Labs  Lab 08/13/18 2047  LIPASE 33   No results for input(s): AMMONIA in the last 168 hours.  Coagulation Profile: Recent Labs  Lab 08/13/18 2000  INR 1.18    Cardiac Enzymes: No results for input(s): CKTOTAL, CKMB, CKMBINDEX, TROPONINI in the last 168 hours.  BNP (last 3 results) No results for input(s): PROBNP in the last 8760 hours.  HbA1C: No results for input(s): HGBA1C in the last 72 hours.  CBG: Recent Labs  Lab 08/13/18 2308  GLUCAP 156*    Lipid Profile: No results for input(s): CHOL, HDL, LDLCALC, TRIG, CHOLHDL, LDLDIRECT in the last 72 hours.  Thyroid Function Tests: No results for input(s): TSH, T4TOTAL, FREET4, T3FREE, THYROIDAB in the last 72 hours.  Anemia Panel: No results for input(s): VITAMINB12, FOLATE, FERRITIN, TIBC, IRON, RETICCTPCT in the last 72 hours.  Urine analysis:    Component Value Date/Time   COLORURINE AMBER (A) 08/13/2018 2010   APPEARANCEUR CLOUDY (A) 08/13/2018 2010   LABSPEC 1.014 08/13/2018 2010   PHURINE 5.0 08/13/2018 2010   GLUCOSEU NEGATIVE 08/13/2018 2010   HGBUR NEGATIVE 08/13/2018 2010   [place]  NEGATIVE 08/13/2018 2010   [place] NEGATIVE 08/13/2018 2010   PROTEINUR 30 (A) 08/13/2018 2010   NITRITE NEGATIVE 08/13/2018 2010   LEUKOCYTESUR NEGATIVE 08/13/2018 2010    Sepsis Labs: Lactic Acid, Venous    Component Value Date/Time   LATICACIDVEN 1.95 (H) 08/13/2018 2313    MICROBIOLOGY: Recent Results (from the past 240 hour(s))  Culture, blood (Routine x 2)     Status: None (Preliminary result)   Collection Time: 08/13/18  7:20 PM  Result Value Ref Range Status   Specimen Description BLOOD RIGHT ARM  Final   Special Requests   Final    BOTTLES DRAWN AEROBIC AND ANAEROBIC Blood Culture adequate volume   Culture  Setup Time   Final    GRAM POSITIVE RODS ANAEROBIC BOTTLE ONLY CRITICAL RESULT CALLED TO, READ BACK BY AND VERIFIED WITH: J. [place], [place] ON 08/16/18 BY C. JESSUP, MLT.    Culture   Final    CULTURE REINCUBATED FOR BETTER GROWTH Performed at [facility], [place] [address], [place], [place] [address]    Report Status PENDING  Incomplete  Culture, blood (Routine x 2)     Status: None   Collection Time: 08/13/18  7:40 PM  Result Value Ref Range Status   Specimen Description BLOOD LEFT ARM  Final   Special Requests   Final    BOTTLES DRAWN AEROBIC ONLY Blood Culture results may not be optimal due to an excessive volume of blood received in culture bottles   Culture   Final    NO GROWTH 5 DAYS Performed at [facility], [place] [address], [place], [place] [address]    Report Status 08/18/2018 FINAL  Final  Urine culture     Status: None   Collection Time: 08/13/18  8:29 PM  Result Value Ref Range Status   Specimen Description URINE, RANDOM  Final   Special Requests NONE  Final   Culture   Final    NO GROWTH Performed at [facility], [place] [address] [place], [place] [address]    Report Status 08/15/2018 FINAL  Final  MRSA PCR Screening     Status: None   Collection Time: 08/15/18 12:09  AM  Result Value Ref Range Status   MRSA by  PCR NEGATIVE NEGATIVE Final    Comment:        The GeneXpert MRSA Assay (FDA approved for NASAL specimens only), is one component of a comprehensive MRSA colonization surveillance program. It is not intended to diagnose MRSA infection nor to guide or monitor treatment for MRSA infections. Performed at [facility], [place] [address], [place], [place] [address]     RADIOLOGY STUDIES/RESULTS: Ct Abdomen Pelvis Wo Contrast  Result Date: 08/13/2018 CLINICAL DATA:  Groin swelling EXAM: CT ABDOMEN AND PELVIS WITHOUT CONTRAST TECHNIQUE: Multidetector CT imaging of the abdomen and pelvis was performed following the standard protocol without IV contrast. COMPARISON:  MRI 07/10/2018 FINDINGS: Lower chest: Lung bases demonstrate no acute consolidation or effusion. The heart size is normal. Small hiatal hernia. Hepatobiliary: No focal liver abnormality is seen. No gallstones, gallbladder wall thickening, or biliary dilatation. Pancreas: Unremarkable. No pancreatic ductal dilatation or surrounding inflammatory changes. Spleen: Normal in size without focal abnormality. Adrenals/Urinary Tract: Adrenal glands are within normal limits. No hydronephrosis. Thick-walled urinary bladder Stomach/Bowel: Stomach is nonenlarged. No dilated small bowel. Extensive gas collection Between the rectum and prostate. Vascular/Lymphatic: Mild aortic atherosclerosis. No aneurysm. No significantly enlarged lymph nodes. Reproductive: Metallic densities along the posterior aspect of the prostate. Large gas collections around the prostate gland. Moderate edema of the scrotum and around the penis. Other: No free air. Fat in the left inguinal canal. Edema within the pubic soft tissues. Gas and fluid collection extending from the rectal area and along both sides of the prostate gland. Musculoskeletal: No acute or suspicious abnormality. IMPRESSION: 1. Moderate edema and mild inflammatory changes involving the scrotum and soft tissues  about the penis, but without soft tissue emphysema. Mild edema within the pubic soft tissues. 2. Abnormal gas and fluid collection between the rectum and prostate gland and extending along the right and left aspects of the prostate gland, felt to correspond to previously demonstrated rectal ulcer and sinus tracks. 3. Thick-walled appearance of the urinary bladder, possible cystitis Electronically Signed   By: Donavan Foil M.D.   On: 08/13/2018 23:19   Dg Chest 2 View  Result Date: 08/13/2018 CLINICAL DATA:  Acute onset of genital swelling. Fever. EXAM: CHEST - 2 VIEW COMPARISON:  Chest radiograph performed 07/07/2018 FINDINGS: The lungs are well-aerated. Minimal left basilar atelectasis is noted. There is no evidence of pleural effusion or pneumothorax. The heart is normal in size; the mediastinal contour is within normal limits. No acute osseous abnormalities are seen. IMPRESSION: Minimal left basilar atelectasis noted. Lungs otherwise clear. Electronically Signed   By: Garald Balding M.D.   On: 08/13/2018 22:16   Ct Pelvis Wo Contrast  Result Date: 08/17/2018 CLINICAL DATA:  Proctitis with micro perforation, now with abscess at penile shaft, worsening pelvic swelling EXAM: CT PELVIS WITHOUT CONTRAST TECHNIQUE: Multidetector CT imaging of the pelvis was performed following the standard protocol without intravenous contrast. COMPARISON:  CT abdomen/pelvis dated 09/09/2018. MR pelvis dated 07/10/2018. FINDINGS: Motion degraded images. Urinary Tract: Thick-walled bladder with indwelling Foley catheter and nondependent gas. Bowel: Visualized bowel is poorly evaluated but grossly unremarkable. Vascular/Lymphatic: Mild atherosclerotic calcifications of the bilateral iliac vessels. No suspicious pelvic lymphadenopathy. Reproductive: Gas within the bilateral seminal vesicles. Additional gas along the retro prostatic soft tissues (series 3/image 37) and extending along the right pelvic sidewall (series 3/image 31),  unchanged. Fiducial radiation markers along the posterior prostate. One of these markers is now displaced  posterior to the rectum (series 3/image 29), new from recent CT. Other: Subcutaneous stranding along the anterior lower pelvic wall, left greater than right (series 3/image 32), progressive. Additional fluid/stranding along the proximal penile shaft (series 3/image 54) with diffuse scrotal edema (series 3/image 68). This appearance is compatible with cellulitis. No drainable fluid collection/abscess on CT. Musculoskeletal: Visualized osseous structures are within normal limits. IMPRESSION: Gas along the bilateral seminal vesicles and retro prostatic soft tissues, likely related to known rectal microperforation, unchanged. Associated cellulitis involving the lower pelvic wall, penile shaft, and scrotum. No drainable fluid collection/abscess on CT. Displaced fiducial radiation marker now posterior to the rectum, new from recent CT. Electronically Signed   By: Julian Hy M.D.   On: 08/17/2018 11:34   US Renal  Result Date: 08/14/2018 CLINICAL DATA:  Acute renal failure. EXAM: RENAL / URINARY TRACT ULTRASOUND COMPLETE COMPARISON:  Noncontrast CT yesterday. FINDINGS: Right Kidney: Renal measurements: 12.6 x 5.2 x 6.1 cm = volume: 207 mL . Echogenicity within normal limits. No mass or hydronephrosis visualized. Left Kidney: Renal measurements: 12.8 x 5.1 x 5.7 cm = volume: 195 mL. Echogenicity within normal limits. No mass or hydronephrosis visualized. Bladder: Bladder wall thickening of 9 mm, as seen on CT. IMPRESSION: 1. Bladder wall thickening, similar to CT yesterday. 2. Unremarkable sonographic appearance of both kidneys. No hydronephrosis. Electronically Signed   By: Keith Rake M.D.   On: 08/14/2018 03:48   US Scrotum W/doppler  Result Date: 08/13/2018 CLINICAL DATA:  Initial evaluation for acute testicular pain, swelling. EXAM: SCROTAL ULTRASOUND DOPPLER ULTRASOUND OF THE TESTICLES TECHNIQUE:  Complete ultrasound examination of the testicles, epididymis, and other scrotal structures was performed. Color and spectral Doppler ultrasound were also utilized to evaluate blood flow to the testicles. COMPARISON:  None. FINDINGS: Right testicle Measurements: 3.5 x 2.5 x 1.9 cm. No mass lesion. Testicular microlithiasis noted. Left testicle Measurements: 3.9 x 2.2 x 2.3 cm. No mass lesion. Testicular microlithiasis noted. Right epididymis:  Normal in size and appearance. Left epididymis: Diffusely increased vascularity seen within the left epididymis, suggesting acute epididymitis. Hydrocele:  Bilateral hydroceles noted. Varicocele:  Bilateral varicoceles noted. Pulsed Doppler interrogation of both testes demonstrates normal low resistance arterial and venous waveforms bilaterally. Incidental note made of a probable small fat containing right inguinal hernia. IMPRESSION: 1. Increased vascularity within the left epididymis, suggesting acute epididymitis. No evidence for testicular torsion. 2. Small moderate bilateral hydroceles, likely reactive. 3. Small bilateral varicoceles. 4. Probable small fat containing right inguinal hernia. 5. Testicular microlithiasis. Current literature suggests that testicular microlithiasis is not a significant independent risk factor for development of testicular carcinoma, and that follow up imaging is not warranted in the absence of other risk factors. Monthly testicular self-examination and annual physical exams are considered appropriate surveillance. If patient has other risk factors for testicular carcinoma, then referral to Urology should be considered. (Reference: DeCastro, et al.: A 5-Year Follow up Study of Asymptomatic Men with Testicular Microlithiasis. J Urol 2008; 175:1025-8527.) Electronically Signed   By: Jeannine Boga M.D.   On: 08/13/2018 22:48   US Abdomen Limited Ruq  Result Date: 08/14/2018 CLINICAL DATA:  Initial evaluation for elevated AST with  leukocytosis. EXAM: ULTRASOUND ABDOMEN LIMITED RIGHT UPPER QUADRANT COMPARISON:  None. FINDINGS: Gallbladder: No gallstones or wall thickening visualized. No sonographic Murphy sign noted by sonographer. Common bile duct: Diameter: 4.6 mm Liver: No focal lesion identified. Within normal limits in parenchymal echogenicity. Portal vein is patent on color Doppler imaging with normal direction of  blood flow towards the liver. IMPRESSION: Normal right upper quadrant ultrasound. No cholelithiasis, evidence for acute cholecystitis, or biliary dilatation. Electronically Signed   By: Jeannine Boga M.D.   On: 08/14/2018 03:43     LOS: 4 days   Oren Binet, MD  [facility]  If 7PM-7AM, please contact night-coverage  Please page via [email or web address] [email or web address]-click on MD name and type text message  08/18/2018, 10:43 AM

## 2018-08-18 NOTE — Progress Notes (Signed)
CC:  Severe sepsis  Subjective: Patient feels like his is a little better. 1-2 bms yesterday. Progressive worsening of penoscrotal infection now with purulent drainage  Objective: Vital signs in last 24 hours: Temp:  [98.3 F (36.8 C)-100.1 F (37.8 C)] 99.8 F (37.7 C) (01/08 0530) Pulse Rate:  [93-110] 109 (01/07 1555) Resp:  [18-25] 25 (01/07 1555) BP: (140-150)/(68-81) 150/81 (01/07 1555) SpO2:  [95 %-96 %] 96 % (01/07 1555) Last BM Date: 08/17/18  RUQ NL:[id number] right upper quadrant ultrasound. No cholelithiasis, evidence for acute cholecystitis, or biliary dilatation. CT 1/3:Moderate edema and mild inflammatory changes involving the scrotum and soft tissues about the penis, but without soft tissue emphysema. Mild edema within the pubic soft tissues. 2. Abnormal gas and fluid collection between the rectum and prostate gland and extending along the right and left aspects of the prostate gland, felt to correspond to previously demonstrated rectal ulcer and sinus tracks. 3. Thick-walled appearance of the urinary bladder, possible cystitis  Intake/Output from previous day: 01/07 0701 - 01/08 0700 In: 446.7 [I.V.:47.7; Blood:300; IV Piggyback:99] Out: 1700 [Urine:1700] Intake/Output this shift: No intake/output data recorded.  Alert and cooperative Unlabored respirations Abdomen soft, mildly tender in lower fields Penoscrotal edema with purulent drainage from multiple sites, no crepitus  Lab Results:  Recent Labs    08/17/18 0506 08/18/18 0359  WBC 16.2* 14.9*  HGB 7.4* 8.1*  HCT 23.3* 24.2*  PLT 406* 423*    BMET Recent Labs    08/17/18 0506 08/18/18 0359  NA 135 135  K 3.8 4.5  CL 104 104  CO2 20* 21*  GLUCOSE 96 86  BUN 56* 46*  CREATININE 2.41* 2.01*  CALCIUM 8.0* 8.0*   PT/INR No results for input(s): LABPROT, INR in the last 72 hours.  Recent Labs  Lab 08/13/18 2000 08/14/18 0438 08/15/18 0238 08/16/18 0327 08/17/18 0506  AST 50* 33 35  47* 41  ALT 41 28 26 33 33  ALKPHOS 118 87 128* 88 83  BILITOT 6.4* 5.0* 3.7* 2.9* 2.3*  PROT 7.8 6.1* 5.7* 6.1* 6.3*  ALBUMIN 2.0* 1.5* 1.2* 1.4* 1.4*     Lipase     Component Value Date/Time   LIPASE 33 08/13/2018 2047     Medications: . sodium chloride   Intravenous Once  . atorvastatin  40 mg Oral Q supper  . cholecalciferol  2,000 Units Oral Daily  . feeding supplement (ENSURE ENLIVE)  237 mL Oral BID BM  . ferrous sulfate  325 mg Oral Q breakfast  . metoprolol tartrate  50 mg Oral BID  . metroNIDAZOLE  500 mg Oral Q8H  . multivitamin with minerals  1 tablet Oral Daily  . pantoprazole  40 mg Oral BID AC  . polycarbophil  625 mg Oral BID  . prazosin  2 mg Oral QHS   . sodium chloride Stopped (08/17/18 0659)  . ceFEPime (MAXIPIME) IV 2 g (08/17/18 2025)    Assessment/Plan Hx of prostate CA with external beam radiation Gleason 6 Acute renal failure -creatinine improving OSA - CPAP BPH Hiccups x 1 month  Full thickness perforation of rectal ulcer/diarrhea Severe sepsis - prostatitis/epididymitis/colitis  WBC slowly trending down, but worsening soft tissue infection in perineum Anemia - chronic disease, Has received 2 units, hgb stable  FEN: Heart healthy ID: Maxipime 1/3>> day 6  Flagyl 1/3>> day 6 DVT: Anemia/SCD Follow up:  Dr. Eliezer Mccoy  Plan: Dr. Tresa Moore planning to debride and cysto in OR today. Depending on progress after  this he may unfortunately require fecal diversion prior to discharge.       LOS: 4 days    Clovis Riley MD 08/18/2018

## 2018-08-18 NOTE — Anesthesia Procedure Notes (Signed)
Procedure Name: Intubation Performed by: Damian Hofstra H, CRNA Pre-anesthesia Checklist: Patient identified, Emergency Drugs available, Suction available and Patient being monitored Patient Re-evaluated:Patient Re-evaluated prior to induction Oxygen Delivery Method: Circle System Utilized Preoxygenation: Pre-oxygenation with 100% oxygen Induction Type: IV induction Ventilation: Mask ventilation without difficulty Laryngoscope Size: Mac and 4 Grade View: Grade II Tube type: Oral Tube size: 7.5 mm Number of attempts: 1 Airway Equipment and Method: Stylet and Oral airway Placement Confirmation: ETT inserted through vocal cords under direct vision,  positive ETCO2 and breath sounds checked- equal and bilateral Secured at: 24 cm Tube secured with: Tape Dental Injury: Teeth and Oropharynx as per pre-operative assessment        

## 2018-08-18 NOTE — Transfer of Care (Signed)
Immediate Anesthesia Transfer of Care Note  Patient: Phillip Blackwell  Procedure(s) Performed: CYSTOSCOPY (N/A Penis) IRRIGATION AND DEBRIDEMENT penal scrotal ABSCESS (N/A Perineum)  Patient Location: PACU  Anesthesia Type:General  Level of Consciousness: drowsy  Airway & Oxygen Therapy: Patient Spontanous Breathing and Patient connected to nasal cannula oxygen  Post-op Assessment: Report given to RN and Post -op Vital signs reviewed and stable  Post vital signs: Reviewed and stable  Last Vitals:  Vitals Value Taken Time  BP 139/76 08/18/2018  6:54 PM  Temp 97.7   Pulse 100 08/18/2018  6:59 PM  Resp 24 08/18/2018  6:59 PM  SpO2 98 % 08/18/2018  6:59 PM  Vitals shown include unvalidated device data.  Last Pain:  Vitals:   08/18/18 1415  TempSrc:   PainSc: 0-No pain      Patients Stated Pain Goal: 2 (73/56/70 1410)  Complications: No apparent anesthesia complications

## 2018-08-18 NOTE — Progress Notes (Signed)
Day of Surgery   Subjective/Chief Complaint:   1 - Prostate Cancer, Likely Recurrent - s/p primary external beam radiation completed 03/2017 in [place] for larger volume Gleason 6 disease. Initial PSA 5.5 and diagnosed 2017 by Dr. Alyson Ingles.  Recent PSA Course: 06/2017 - PSA 1.1 06/2018 - PSA 2.6   2 - Acute Renal Failure - Cr 7.2 / K 5.0 by ER labs 08/14/18 on eval sepsis. [place] and CT w/o hydro or distend bladder. Baseline Cr 1.1 as recently as 07/2018.  3 - Sepsis of GI/GU Origin with Prostatitis / Epididymitis / Colitis / Severe Penoscrotal Infection - lactate 3.8, WBC 42k by ET labs 08/14/18 on eval fevers and malaise. UA unremarkable, UCX negative. Whigham 1/3 GPR 1 botte / pending. Scrotal US and Pelvic CT with inflmmation of epidydimis / prostate / peri-rectal gas c/w likely infection stemming from colon microperf seeding prostate and GU tract. No abscesses / fluid collections wtihin prostate or pelvis. Initially had good systemic response to IV ABX but worsened penoscrotal skin swelling and then some purlulent drainage 1/7, Repeat CT 1/7 with some progression of peri-rectal gas and new displacement of prior fiducial marker (confirming hole in rectum) and still no large fluid collections / sub-Q gas and exam with new draining sinus tracts at penoscrotal junction.   4 - Rectal Ulcer/Suspect Perforation - known DX rectal ulcer with periodic bleeding following prostate radiation 2018. Flex-Sig / BX 06/2018 of ulcer edges negative for malignancy. CT 08/14/18 with gas in peri-rectal / peri-prostatic space c/w likely microperforation.   Today " Mckenzie" is ready for first stage I+D of penoscrotal wound. Continues to have fevers, but now low grade. Hgb now >8.    Objective: Vital signs in last 24 hours: Temp:  [99.3 F (37.4 C)-100.1 F (37.8 C)] 99.3 F (37.4 C) (01/08 0947) Pulse Rate:  [100] 100 (01/08 0947) Resp:  [19] 19 (01/08 0947) BP: (150)/(76) 150/76 (01/08 0947) Last BM Date:  08/17/18  Intake/Output from previous day: 01/07 0701 - 01/08 0700 In: 446.7 [I.V.:47.7; Blood:300; IV Piggyback:99] Out: 1700 [Urine:1700] Intake/Output this shift: Total I/O In: 0  Out: 900 [Urine:900]   Constitutional: He appears well-developed. Family at bedside in [place] holdign area.  HENT:  Head: Normocephalic.  Eyes: Pupils are equal, round, and reactive to light.  Neck: Normal range of motion.  Cardiovascular:  Improved, mild regular tachycardia by bedside monitor  Respiratory:  Non-labored on room air GI: Soft. There is no abdominal tenderness. There is no rebound and no guarding.  Genitourinary:    Genitourinary Comments: Significant progression of penoscrotal swelling now with 3 sinus tracts (midline, Rt superioer, left inferior) draining copious pus. Still no crepitus, about 10% of involved skin appears non-viable.  Musculoskeletal: Normal range of motion.  Neurological: He is alert.  Skin: Skin is warm.  Psychiatric: He has a normal mood and affect. His behavior is normal.  Lab Results:  Recent Labs    08/17/18 0506 08/18/18 0359  WBC 16.2* 14.9*  HGB 7.4* 8.1*  HCT 23.3* 24.2*  PLT 406* 423*   BMET Recent Labs    08/17/18 0506 08/18/18 0359  NA 135 135  K 3.8 4.5  CL 104 104  CO2 20* 21*  GLUCOSE 96 86  BUN 56* 46*  CREATININE 2.41* 2.01*  CALCIUM 8.0* 8.0*   PT/INR No results for input(s): LABPROT, INR in the last 72 hours. ABG No results for input(s): PHART, HCO3 in the last 72 hours.  Invalid input(s): PCO2, PO2  Studies/Results: Ct Pelvis Wo Contrast  Result Date: 08/17/2018 CLINICAL DATA:  Proctitis with micro perforation, now with abscess at penile shaft, worsening pelvic swelling EXAM: CT PELVIS WITHOUT CONTRAST TECHNIQUE: Multidetector CT imaging of the pelvis was performed following the standard protocol without intravenous contrast. COMPARISON:  CT abdomen/pelvis dated 09/09/2018. MR pelvis dated 07/10/2018. FINDINGS: Motion degraded  images. Urinary Tract: Thick-walled bladder with indwelling Foley catheter and nondependent gas. Bowel: Visualized bowel is poorly evaluated but grossly unremarkable. Vascular/Lymphatic: Mild atherosclerotic calcifications of the bilateral iliac vessels. No suspicious pelvic lymphadenopathy. Reproductive: Gas within the bilateral seminal vesicles. Additional gas along the retro prostatic soft tissues (series 3/image 37) and extending along the right pelvic sidewall (series 3/image 31), unchanged. Fiducial radiation markers along the posterior prostate. One of these markers is now displaced posterior to the rectum (series 3/image 29), new from recent CT. Other: Subcutaneous stranding along the anterior lower pelvic wall, left greater than right (series 3/image 32), progressive. Additional fluid/stranding along the proximal penile shaft (series 3/image 54) with diffuse scrotal edema (series 3/image 68). This appearance is compatible with cellulitis. No drainable fluid collection/abscess on CT. Musculoskeletal: Visualized osseous structures are within normal limits. IMPRESSION: Gas along the bilateral seminal vesicles and retro prostatic soft tissues, likely related to known rectal microperforation, unchanged. Associated cellulitis involving the lower pelvic wall, penile shaft, and scrotum. No drainable fluid collection/abscess on CT. Displaced fiducial radiation marker now posterior to the rectum, new from recent CT. Electronically Signed   By: Julian Hy M.D.   On: 08/17/2018 11:34    Anti-infectives: Anti-infectives (From admission, onward)   Start     Dose/Rate Route Frequency Ordered Stop   08/17/18 1400  metroNIDAZOLE (FLAGYL) tablet 500 mg     500 mg Oral Every 8 hours 08/17/18 1303     08/16/18 2000  ceFEPIme (MAXIPIME) 2 g in sodium chloride 0.9 % 100 mL IVPB     2 g 200 mL/hr over 30 Minutes Intravenous Every 24 hours 08/16/18 1328     08/15/18 1030  vancomycin (VANCOCIN) 1,250 mg in sodium  chloride 0.9 % 250 mL IVPB     1,250 mg 166.7 mL/hr over 90 Minutes Intravenous  Once 08/15/18 0942 08/15/18 1444   08/14/18 2000  ceFEPIme (MAXIPIME) 1 g in sodium chloride 0.9 % 100 mL IVPB  Status:  Discontinued     1 g 200 mL/hr over 30 Minutes Intravenous Every 24 hours 08/13/18 2052 08/16/18 1328   08/13/18 2052  vancomycin variable dose per unstable renal function (pharmacist dosing)  Status:  Discontinued      Does not apply See admin instructions 08/13/18 2052 08/16/18 1442   08/13/18 2045  ceFEPIme (MAXIPIME) 2 g in sodium chloride 0.9 % 100 mL IVPB     2 g 200 mL/hr over 30 Minutes Intravenous  Once 08/13/18 2038 08/13/18 2141   08/13/18 2045  metroNIDAZOLE (FLAGYL) IVPB 500 mg  Status:  Discontinued     500 mg 100 mL/hr over 60 Minutes Intravenous Every 8 hours 08/13/18 2038 08/17/18 1303   08/13/18 2045  vancomycin (VANCOCIN) IVPB 1000 mg/200 mL premix     1,000 mg 200 mL/hr over 60 Minutes Intravenous  Once 08/13/18 2038 08/13/18 2333      Assessment/Plan:  1 - Prostate Cancer, Likely Recurrent - PSA pattern most c/w active disease, but no signs of metastatic involvement. May warrant tissue sampling via transperineal approach in elective setting. No further diagnostics or intervention this admission.   2 - Acute  Renal Failure - Improving, likely pre-renal due to sepsis. No hydro to warrant stentig / neph tubes. Rec continued foley for accurate UOP monitoring, appreciate nephrology comanagement.   3 - Sepsis of GI/GU Origin with Prostatitis / Epididymitis / Colitis / Severe Penoscrotal Infection - Although systemically impriving, his infection is progressing locally with new involvment of penoscrotal skin. Not frank Fornier's by imaging or exam.  Rec today as planned for I+D / cysto (to rule out GU tract violation / fistula), and multifocal drain placement. This will likely require prologed dressing changes, IV ABX and likely staged surgical debridement.   Risks,  benefits, alternatives, goals, need for possible staged approach discussed previously and reiterated today with patient and family.   4 - Rectal Ulcer/Suspect Perforation - likely from progression of known radiation-induced rectal ulcer, very well may ultimately need GI and possible eventual GU diversion (if still has problems despite GI diversion). Appreciate gen-surg opinion, we have discussed this again today.   Alexis Frock 08/18/2018

## 2018-08-18 NOTE — Progress Notes (Addendum)
Physical Therapy Treatment Patient Details Name: Phillip Blackwell MRN: [id number] DOB: Nov 09, 1960 Today's Date: 08/18/2018    History of Present Illness Pt is a 58 y.o. male with medical history significant of anxiety, depression, GERD, hypertension, CVA, OSA presenting to the hospital for evaluation of groin pain and swelling , fever, blood in stool, work-up was significant for sepsis, acute blood loss anemia, is admitted for further work-up, this is secondary to colitis/prostatitis/epididymitis from perforated rectal ulcer    PT Comments    Pt remains very limited secondary to pain in his groin area. Plan for procedure later today to address scrotal infection with blood drainage (debridement with drain placement). Pt on bed pan upon arrival, min A to roll bilaterally and total A for pericare. Pt also able to perform supine<>sit with mod A x2 and sit EOB for approximately two minutes. Very limited secondary to pain with sitting position. Pt would continue to benefit from skilled physical therapy services at this time while admitted and after d/c to address the below listed limitations in order to improve overall safety and independence with functional mobility.   Follow Up Recommendations  CIR;Supervision/Assistance - 24 hour     Equipment Recommendations  Rolling walker with 5" wheels    Recommendations for Other Services       Precautions / Restrictions Precautions Precautions: Fall Restrictions Weight Bearing Restrictions: No    Mobility  Bed Mobility Overal bed mobility: Needs Assistance Bed Mobility: Rolling;Sidelying to Sit;Sit to Sidelying Rolling: Min assist Sidelying to sit: Mod assist;+2 for physical assistance     Sit to sidelying: Mod assist;+2 for physical assistance General bed mobility comments: pt rolling bilaterally multiple times for pericare; cues for technique, increased time, assist for legs off bed and for trunk elevation  Transfers                  General transfer comment: pt unable to tolerate sitting upright at EOB for more than ~2 minutes secondary to scrotal pain and prematurely lying back down in bed  Ambulation/Gait                 Stairs             Wheelchair Mobility    Modified Rankin (Stroke Patients Only)       Balance Overall balance assessment: Needs assistance Sitting-balance support: Feet supported;Bilateral upper extremity supported Sitting balance-Leahy Scale: Poor Sitting balance - Comments: R lateral lean secondary to pain                                    Cognition Arousal/Alertness: Awake/alert Behavior During Therapy: Flat affect Overall Cognitive Status: Impaired/Different from baseline Area of Impairment: Memory;Following commands;Safety/judgement;Problem solving                     Memory: Decreased short-term memory;Decreased recall of precautions Following Commands: Follows one step commands with increased time Safety/Judgement: Decreased awareness of deficits;Decreased awareness of safety   Problem Solving: Slow processing;Decreased initiation;Difficulty sequencing;Requires verbal cues        Exercises      General Comments        Pertinent Vitals/Pain Pain Assessment: Faces Faces Pain Scale: Hurts even more Pain Location: groin area Pain Descriptors / Indicators: Sore Pain Intervention(s): Monitored during session;Repositioned    Home Living  Prior Function            PT Goals (current goals can now be found in the care plan section) Acute Rehab PT Goals PT Goal Formulation: With patient/family Time For Goal Achievement: 08/31/18 Potential to Achieve Goals: Good Progress towards PT goals: Progressing toward goals    Frequency    Min 3X/week      PT Plan Current plan remains appropriate    Co-evaluation              AM-PAC PT "6 Clicks" Mobility   Outcome Measure  Help needed  turning from your back to your side while in a flat bed without using bedrails?: A Little Help needed moving from lying on your back to sitting on the side of a flat bed without using bedrails?: A Lot Help needed moving to and from a bed to a chair (including a wheelchair)?: A Lot Help needed standing up from a chair using your arms (e.g., wheelchair or bedside chair)?: A Lot Help needed to walk in hospital room?: A Lot Help needed climbing 3-5 steps with a railing? : Total 6 Click Score: 12    End of Session   Activity Tolerance: Patient limited by pain;Patient limited by fatigue Patient left: in bed;with call bell/phone within reach;with nursing/sitter in room;Other (comment)(several nurse tech's present) Nurse Communication: Mobility status PT Visit Diagnosis: Muscle weakness (generalized) (M62.81);Difficulty in walking, not elsewhere classified (R26.2)     Time: 5374-8270 PT Time Calculation (min) (ACUTE ONLY): 30 min  Charges:  $Therapeutic Activity: 23-37 mins                     Sherie Don, [place], DPT  Acute Rehabilitation Services Pager [phone] Office [place] 08/18/2018, 4:50 PM

## 2018-08-19 ENCOUNTER — Encounter (HOSPITAL_COMMUNITY): Payer: Self-pay | Admitting: Urology

## 2018-08-19 DIAGNOSIS — E440 Moderate protein-calorie malnutrition: Secondary | ICD-10-CM

## 2018-08-19 LAB — CBC
HCT: 23 % — ABNORMAL LOW (ref 39.0–52.0)
Hemoglobin: 7.4 g/dL — ABNORMAL LOW (ref 13.0–17.0)
MCH: 28.8 pg (ref 26.0–34.0)
MCHC: 32.2 g/dL (ref 30.0–36.0)
MCV: 89.5 fL (ref 80.0–100.0)
Platelets: 496 10*3/uL — ABNORMAL HIGH (ref 150–400)
RBC: 2.57 MIL/uL — ABNORMAL LOW (ref 4.22–5.81)
RDW: 16.3 % — ABNORMAL HIGH (ref 11.5–15.5)
WBC: 12 10*3/uL — ABNORMAL HIGH (ref 4.0–10.5)
nRBC: 0 % (ref 0.0–0.2)

## 2018-08-19 LAB — HEPATIC FUNCTION PANEL
ALT: 31 U/L (ref 0–44)
AST: 42 U/L — ABNORMAL HIGH (ref 15–41)
Albumin: 1.2 g/dL — ABNORMAL LOW (ref 3.5–5.0)
Alkaline Phosphatase: 66 U/L (ref 38–126)
BILIRUBIN INDIRECT: 1 mg/dL — AB (ref 0.3–0.9)
Bilirubin, Direct: 0.6 mg/dL — ABNORMAL HIGH (ref 0.0–0.2)
Total Bilirubin: 1.6 mg/dL — ABNORMAL HIGH (ref 0.3–1.2)
Total Protein: 6.2 g/dL — ABNORMAL LOW (ref 6.5–8.1)

## 2018-08-19 LAB — BASIC METABOLIC PANEL
Anion gap: 7 (ref 5–15)
BUN: 38 mg/dL — ABNORMAL HIGH (ref 6–20)
CHLORIDE: 106 mmol/L (ref 98–111)
CO2: 21 mmol/L — AB (ref 22–32)
Calcium: 8 mg/dL — ABNORMAL LOW (ref 8.9–10.3)
Creatinine, Ser: 1.55 mg/dL — ABNORMAL HIGH (ref 0.61–1.24)
GFR calc Af Amer: 56 mL/min — ABNORMAL LOW (ref >60)
GFR calc non Af Amer: 48 mL/min — ABNORMAL LOW (ref >60)
GLUCOSE: 172 mg/dL — AB (ref 70–99)
Potassium: 4.4 mmol/L (ref 3.5–5.1)
Sodium: 134 mmol/L — ABNORMAL LOW (ref 135–145)

## 2018-08-19 LAB — CULTURE, BLOOD (ROUTINE X 2): Special Requests: ADEQUATE

## 2018-08-19 NOTE — Progress Notes (Signed)
1 Day Post-Op    CC:  Severe sepsis  Subjective: He feels a bit better after debridement yesterday.   Objective: Vital signs in last 24 hours: Temp:  [97.2 F (36.2 C)-99.3 F (37.4 C)] 97.6 F (36.4 C) (01/09 0800) Pulse Rate:  [89-106] 91 (01/09 0518) Resp:  [12-24] 14 (01/09 0518) BP: (107-155)/(50-80) 123/69 (01/09 0518) SpO2:  [96 %-100 %] 96 % (01/09 0518) Last BM Date: 08/17/18  RUQ EP:PIRJJO right upper quadrant ultrasound. No cholelithiasis, evidence for acute cholecystitis, or biliary dilatation. CT 1/3:Moderate edema and mild inflammatory changes involving the scrotum and soft tissues about the penis, but without soft tissue emphysema. Mild edema within the pubic soft tissues. 2. Abnormal gas and fluid collection between the rectum and prostate gland and extending along the right and left aspects of the prostate gland, felt to correspond to previously demonstrated rectal ulcer and sinus tracks. 3. Thick-walled appearance of the urinary bladder, possible cystitis  Operative findings 08/18/17- Dr. Tresa Moore: 1.  Approximately 5 cm2 nonviable skin overlying the penoscrotal junction. 2.  Very large, complex abscess fluid collection tracking from the perineum to the scrotum to the infraumbilical anterior abdominal wall skin. 3.  Fixed high=-riding prostate.  No evidence of GI, GU fistula.  Intake/Output from previous day: 01/08 0701 - 01/09 0700 In: 1358 [P.O.:480; I.V.:778; IV Piggyback:100] Out: 1850 [Urine:1750; Blood:100] Intake/Output this shift: No intake/output data recorded.  Alert and cooperative Unlabored respirations Abdomen soft, mildly tender in lower fields Penoscrotal edema with purulent drainage from multiple sites, no crepitus, penrose in place  Lab Results:  Recent Labs    08/18/18 0359 08/19/18 0339  WBC 14.9* 12.0*  HGB 8.1* 7.4*  HCT 24.2* 23.0*  PLT 423* 496*    BMET Recent Labs    08/17/18 0506 08/18/18 0359  NA 135 135  K 3.8 4.5  CL  104 104  CO2 20* 21*  GLUCOSE 96 86  BUN 56* 46*  CREATININE 2.41* 2.01*  CALCIUM 8.0* 8.0*   PT/INR No results for input(s): LABPROT, INR in the last 72 hours.  Recent Labs  Lab 08/14/18 0438 08/15/18 0238 08/16/18 0327 08/17/18 0506 08/19/18 0339  AST 33 35 47* 41 42*  ALT 28 26 33 33 31  ALKPHOS 87 128* 88 83 66  BILITOT 5.0* 3.7* 2.9* 2.3* 1.6*  PROT 6.1* 5.7* 6.1* 6.3* 6.2*  ALBUMIN 1.5* 1.2* 1.4* 1.4* 1.2*     Lipase     Component Value Date/Time   LIPASE 33 08/13/2018 2047     Medications: . atorvastatin  40 mg Oral Q supper  . Chlorhexidine Gluconate Cloth  6 each Topical Daily  . cholecalciferol  2,000 Units Oral Daily  . feeding supplement (ENSURE ENLIVE)  237 mL Oral BID BM  . ferrous sulfate  325 mg Oral Q breakfast  . metoprolol tartrate  50 mg Oral BID  . metroNIDAZOLE  500 mg Oral Q8H  . multivitamin with minerals  1 tablet Oral Daily  . mupirocin ointment  1 application Nasal BID  . pantoprazole  40 mg Oral BID AC  . polycarbophil  625 mg Oral BID  . prazosin  2 mg Oral QHS   . sodium chloride Stopped (08/17/18 0659)  . ceFEPime (MAXIPIME) IV 2 g (08/18/18 2229)  . lactated ringers 10 mL/hr at 08/18/18 2212    Assessment/Plan Hx of prostate CA with external beam radiation Gleason 6 Acute renal failure -creatinine improving OSA - CPAP BPH Hiccups x 1 month  Full  thickness perforation of rectal ulcer/diarrhea Severe sepsis - prostatitis/epididymitis/colitis  WBC slowly trending down, but worsening soft tissue infection in perineum now POD 1 debridement Anemia - chronic disease, Has received 2 units, hgb stable  FEN: Heart healthy ID: Maxipime 1/3>> day 7  Flagyl 1/3>> day 7 DVT: Anemia/SCD Follow up:  Dr. Eliezer Mccoy  Plan: I again discussed with Mr. Simson the concept of diverting colostomy and why this is recommended. He seems to be slowly coming around to this idea, but does not feel ready for this. We spent about 25 minutes in  conversation today answering his questions and discussing that the sooner the better to promote healing and get him to where he is able to leave the hospital. He will talk to his family today and I offered to have the [facility]OC[facility] come talk to him more to discuss life with an ostomy and he would like to have that consultation. From a logistics standpoint this can be done as early as tomorrow, but the patient may need a little more time to agree to the procedure. Ideally we will try and coordinate with Dr. Zettie Pho future debridement plans       LOS: 5 days    Clovis Riley MD 08/19/2018

## 2018-08-19 NOTE — Consult Note (Signed)
[facility] Nurse requested for preoperative stoma site marking and ostomy education.  Patient is resistant to the idea of an ostomy.  We discussed the expectations regarding an ostomy, the level of activity he would still enjoy with an ostomy and overall care of an ostomy.  Due to the nature of his wounds, I stressed that this was his optimal plan for healing these wounds.  He agrees for me to mark him. I provided an indelible marker and an extra transparent film dressing.  After I made a mark today, I explained that he can darken this area with the marker.  In case he decides to proceed with the surgery in the near future.   Discussed surgical procedure and stoma creation with patient and family.  Explained role of the [facility] nurse team.  Provided the patient with educational booklet and provided samples of pouching options.  Answered patient and family questions.   Examined patient lying, sitting, and standing in order to place the marking in the patient's visual field, away from any creases or abdominal contour issues and within the rectus muscle. Ideal location for stoma is above the umbilicus for patient to visualize the stoma for self care.   Marked for colostomy in the LUQ   3.5 cm to the left of the umbilicus and 3.5 cm above the umbilicus.   Patient's abdomen cleansed with CHG wipes at site markings, allowed to air dry prior to marking.Covered mark with thin film transparent dressing to preserve mark until date of surgery. Additional marker and film given to patient.   [facility] Nurse team will follow up with patient after surgery for continue ostomy care and teaching.   Domenic Moras MSN, RN, FNP-BC [facility] Wound, Ostomy, Continence Nurse Pager [phone]

## 2018-08-19 NOTE — Progress Notes (Signed)
PROGRESS NOTE        PATIENT DETAILS Name: Phillip Blackwell Age: 58 y.o. Sex: male Date of Birth: 02/10/1961 Admit Date: 08/13/2018 Admitting Physician Shela Leff, MD OIN:OMVEHM, Pcp Not In  Brief Narrative: Patient is a 57 y.o. male with history of hypertension, BPH, OSA prostate cancer status post external beam radiation-presented with sepsis secondary to rectal ulcer with perforation with seeding of the GU tract causing epididymitis/prostatitis.  Further hospital course complicated by worsening penile/scrotal infection and AKI.  See below for further details  Subjective: Denies any chest pain or shortness of breath.  Has many questions about ostomy  Assessment/Plan: Severe sepsis secondary to perforated rectal ulcer with prostatitis/epididymitis and penoscrotal abscess: Sepsis pathophysiology has resolved, underwent incision and drainage of his penoscrotal abscess on 1/8.  General surgery/urology following-with plans for a combined repeat debridement and a diverting colostomy placement over the next few days.  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  Continue empiric cefepime and Flagyl-we will discuss with ID over the next few days regarding antimicrobial therapy recommendations regarding duration and agent  AKI: Thought to be hemodynamically mediated-likely ATN from sepsis.  Renal function is slowly improving-creatinine continues to downtrend.  Plan is to follow electrolytes periodically.   Multifactorial anemia secondary to acute blood loss anemia and secondary to acute/critical illness: Did have hematochezia secondary to rectal ulcer-transfused 3 units of PRBC.  Hemoglobin continues to slowly downtrend-likely secondary to acute illness/critical illness rather than blood loss.  Continue to follow CBC and transfuse if hemoglobin is less than 7.    Hyperbilirubinemia: Appears to be mostly direct  hyperbilirubinemia-however no biliary dilatation seen on most recent CT scan.  Bilirubin has significantly improved and is almost normal.  Follow periodically.  .  Hypertension: Controlled-continue metoprolol and prazosin.    Dyslipidemia: Continue statin  BPH: Continue Flomax  OSA: CPAP nightly  Moderate protein calorie malnutrition: Continue supplements  Acute debility/deconditioning: Likely secondary to acute illness-will likely require SNF for CIR on discharge.  DVT Prophylaxis: SCD's  Code Status: Full code  Family Communication: Spouse at bedside  Disposition Plan: Remain inpatient-requires several more days of hospitalization before consideration of discharge  Antimicrobial agents: Anti-infectives (From admission, onward)   Start     Dose/Rate Route Frequency Ordered Stop   08/17/18 1400  metroNIDAZOLE (FLAGYL) tablet 500 mg     500 mg Oral Every 8 hours 08/17/18 1303     08/16/18 2000  ceFEPIme (MAXIPIME) 2 g in sodium chloride 0.9 % 100 mL IVPB     2 g 200 mL/hr over 30 Minutes Intravenous Every 24 hours 08/16/18 1328     08/15/18 1030  vancomycin (VANCOCIN) 1,250 mg in sodium chloride 0.9 % 250 mL IVPB     1,250 mg 166.7 mL/hr over 90 Minutes Intravenous  Once 08/15/18 0942 08/15/18 1444   08/14/18 2000  ceFEPIme (MAXIPIME) 1 g in sodium chloride 0.9 % 100 mL IVPB  Status:  Discontinued     1 g 200 mL/hr over 30 Minutes Intravenous Every 24 hours 08/13/18 2052 08/16/18 1328   08/13/18 2052  vancomycin variable dose per unstable renal function (pharmacist dosing)  Status:  Discontinued      Does not apply See admin instructions 08/13/18 2052 08/16/18 1442   08/13/18 2045  ceFEPIme (MAXIPIME) 2 g in sodium chloride  0.9 % 100 mL IVPB     2 g 200 mL/hr over 30 Minutes Intravenous  Once 08/13/18 2038 08/13/18 2141   08/13/18 2045  metroNIDAZOLE (FLAGYL) IVPB 500 mg  Status:  Discontinued     500 mg 100 mL/hr over 60 Minutes Intravenous Every 8 hours 08/13/18 2038  08/17/18 1303   08/13/18 2045  vancomycin (VANCOCIN) IVPB 1000 mg/200 mL premix     1,000 mg 200 mL/hr over 60 Minutes Intravenous  Once 08/13/18 2038 08/13/18 2333      Procedures: None  CONSULTS:  general surgery and urology  Time spent: 25 minutes-Greater than 50% of this time was spent in counseling, explanation of diagnosis, planning of further management, and coordination of care.  MEDICATIONS: Scheduled Meds: . atorvastatin  40 mg Oral Q supper  . Chlorhexidine Gluconate Cloth  6 each Topical Daily  . cholecalciferol  2,000 Units Oral Daily  . feeding supplement (ENSURE ENLIVE)  237 mL Oral BID BM  . ferrous sulfate  325 mg Oral Q breakfast  . metoprolol tartrate  50 mg Oral BID  . metroNIDAZOLE  500 mg Oral Q8H  . multivitamin with minerals  1 tablet Oral Daily  . mupirocin ointment  1 application Nasal BID  . pantoprazole  40 mg Oral BID AC  . polycarbophil  625 mg Oral BID  . prazosin  2 mg Oral QHS   Continuous Infusions: . sodium chloride Stopped (08/17/18 0659)  . ceFEPime (MAXIPIME) IV 2 g (08/18/18 2229)  . lactated ringers 10 mL/hr at 08/18/18 2212   PRN Meds:.sodium chloride, acetaminophen **OR** acetaminophen, clonazePAM, ondansetron (ZOFRAN) IV, oxyCODONE, tamsulosin   PHYSICAL EXAM: Vital signs: Vitals:   08/19/18 0518 08/19/18 0800 08/19/18 0939 08/19/18 1151  BP: 123/69  128/68   Pulse: 91  99   Resp: 14  16   Temp:  97.6 F (36.4 C)  97.9 F (36.6 C)  TempSrc:  Oral  Oral  SpO2: 96%     Weight:      Height:       Filed Weights   08/13/18 1922 08/13/18 2234  Weight: 86.2 kg 86.2 kg   Body mass index is 25.77 kg/m.   General appearance:Awake, alert, not in any distress.  Eyes:no scleral icterus. HEENT: Atraumatic and Normocephalic Neck: supple, no JVD. Resp:Good air entry bilaterally,no rales or rhonchi CVS: S1 S2 regular, no murmurs.  GI: Bowel sounds present, Non tender and not distended with no gaurding, rigidity or  rebound. Extremities: B/L Lower Ext shows no edema, both legs are warm to touch Neurology:  Non focal Psychiatric: Normal judgment and insight. Normal mood. Musculoskeletal:No digital cyanosis Skin:No Rash, warm and dry Wounds:N/A  I have personally reviewed following labs and imaging studies  LABORATORY DATA: CBC: Recent Labs  Lab 08/13/18 2000  08/15/18 1821 08/16/18 0327 08/17/18 0506 08/18/18 0359 08/19/18 0339  WBC 41.7*   < > 17.4* 18.5* 16.2* 14.9* 12.0*  NEUTROABS 37.4*  --   --   --   --   --   --   HGB 8.4*   < > 8.0* 8.3* 7.4* 8.1* 7.4*  HCT 26.9*   < > 24.0* 25.1* 23.3* 24.2* 23.0*  MCV 93.4   < > 86.3 86.9 87.9 89.6 89.5  PLT 284   < > 310 321 406* 423* 496*   < > = values in this interval not displayed.    Basic Metabolic Panel: Recent Labs  Lab 08/15/18 0238 08/16/18 0327 08/17/18 0506 08/18/18  0359 08/19/18 0737  NA 134* 135 135 135 134*  K 3.6 3.2* 3.8 4.5 4.4  CL 105 101 104 104 106  CO2 20* 21* 20* 21* 21*  GLUCOSE 145* 94 96 86 172*  BUN 93* 76* 56* 46* 38*  CREATININE 4.80* 3.15* 2.41* 2.01* 1.55*  CALCIUM 7.8* 7.9* 8.0* 8.0* 8.0*    GFR: Estimated Creatinine Clearance: 56.3 mL/min (A) (by C-G formula based on SCr of 1.55 mg/dL (H)).  Liver Function Tests: Recent Labs  Lab 08/14/18 0438 08/15/18 0238 08/16/18 0327 08/17/18 0506 08/19/18 0339  AST 33 35 47* 41 42*  ALT 28 26 33 33 31  ALKPHOS 87 128* 88 83 66  BILITOT 5.0* 3.7* 2.9* 2.3* 1.6*  PROT 6.1* 5.7* 6.1* 6.3* 6.2*  ALBUMIN 1.5* 1.2* 1.4* 1.4* 1.2*   Recent Labs  Lab 08/13/18 2047  LIPASE 33   No results for input(s): AMMONIA in the last 168 hours.  Coagulation Profile: Recent Labs  Lab 08/13/18 2000  INR 1.18    Cardiac Enzymes: No results for input(s): CKTOTAL, CKMB, CKMBINDEX, TROPONINI in the last 168 hours.  BNP (last 3 results) No results for input(s): PROBNP in the last 8760 hours.  HbA1C: No results for input(s): HGBA1C in the last 72  hours.  CBG: Recent Labs  Lab 08/13/18 2308 08/18/18 2229  GLUCAP 156* 100*    Lipid Profile: No results for input(s): CHOL, HDL, LDLCALC, TRIG, CHOLHDL, LDLDIRECT in the last 72 hours.  Thyroid Function Tests: No results for input(s): TSH, T4TOTAL, FREET4, T3FREE, THYROIDAB in the last 72 hours.  Anemia Panel: No results for input(s): VITAMINB12, FOLATE, FERRITIN, TIBC, IRON, RETICCTPCT in the last 72 hours.  Urine analysis:    Component Value Date/Time   COLORURINE AMBER (A) 08/13/2018 2010   APPEARANCEUR CLOUDY (A) 08/13/2018 2010   LABSPEC 1.014 08/13/2018 2010   PHURINE 5.0 08/13/2018 2010   GLUCOSEU NEGATIVE 08/13/2018 2010   HGBUR NEGATIVE 08/13/2018 2010   [place] NEGATIVE 08/13/2018 2010   [place] NEGATIVE 08/13/2018 2010   PROTEINUR 30 (A) 08/13/2018 2010   NITRITE NEGATIVE 08/13/2018 2010   LEUKOCYTESUR NEGATIVE 08/13/2018 2010    Sepsis Labs: Lactic Acid, Venous    Component Value Date/Time   LATICACIDVEN 1.95 (H) 08/13/2018 2313    MICROBIOLOGY: Recent Results (from the past 240 hour(s))  Culture, blood (Routine x 2)     Status: Abnormal   Collection Time: 08/13/18  7:20 PM  Result Value Ref Range Status   Specimen Description BLOOD RIGHT ARM  Final   Special Requests   Final    BOTTLES DRAWN AEROBIC AND ANAEROBIC Blood Culture adequate volume   Culture  Setup Time   Final    GRAM POSITIVE RODS ANAEROBIC BOTTLE ONLY CRITICAL RESULT CALLED TO, READ BACK BY AND VERIFIED WITH: J. [place], [place] ON 08/16/18 BY C. JESSUP, MLT.    Culture (A)  Final    EGGERTHELLA LENTA Standardized susceptibility testing for this organism is not available. Performed at [facility], [place] [address], [place], [place] [address]    Report Status 08/19/2018 FINAL  Final  Culture, blood (Routine x 2)     Status: None   Collection Time: 08/13/18  7:40 PM  Result Value Ref Range Status   Specimen Description BLOOD LEFT ARM  Final   Special Requests    Final    BOTTLES DRAWN AEROBIC ONLY Blood Culture results may not be optimal due to an excessive volume of blood  received in culture bottles   Culture   Final    NO GROWTH 5 DAYS Performed at [facility], [place] [address], [place], [place] [address]    Report Status 08/18/2018 FINAL  Final  Urine culture     Status: None   Collection Time: 08/13/18  8:29 PM  Result Value Ref Range Status   Specimen Description URINE, RANDOM  Final   Special Requests NONE  Final   Culture   Final    NO GROWTH Performed at [facility], [place] [address], [place], [place] [address]    Report Status 08/15/2018 FINAL  Final  MRSA PCR Screening     Status: None   Collection Time: 08/15/18 12:09 AM  Result Value Ref Range Status   MRSA by PCR NEGATIVE NEGATIVE Final    Comment:        The GeneXpert MRSA Assay (FDA approved for NASAL specimens only), is one component of a comprehensive MRSA colonization surveillance program. It is not intended to diagnose MRSA infection nor to guide or monitor treatment for MRSA infections. Performed at [facility], [place] [address], [place], [place] [address]   Surgical pcr screen     Status: Abnormal   Collection Time: 08/18/18 10:06 AM  Result Value Ref Range Status   MRSA, PCR NEGATIVE NEGATIVE Final   Staphylococcus aureus POSITIVE (A) NEGATIVE Final    Comment: (NOTE) The Xpert SA Assay (FDA approved for NASAL specimens in patients 51 years of age and older), is one component of a comprehensive surveillance program. It is not intended to diagnose infection nor to guide or monitor treatment. Performed at [facility], [place] [address] [place], [place] [address]   Aerobic/Anaerobic Culture (surgical/deep wound)     Status: None (Preliminary result)   Collection Time: 08/18/18  6:14 PM  Result Value Ref Range Status   Specimen Description TISSUE  Final   Special Requests PENOSCROTAL  Final   Gram Stain   Final    RARE WBC PRESENT,  PREDOMINANTLY PMN ABUNDANT GRAM POSITIVE COCCI ABUNDANT GRAM NEGATIVE RODS ABUNDANT GRAM POSITIVE RODS    Culture   Final    CULTURE REINCUBATED FOR BETTER GROWTH Performed at [facility], [place] [address], [place], [place] [address]    Report Status PENDING  Incomplete  Aerobic/Anaerobic Culture (surgical/deep wound)     Status: None (Preliminary result)   Collection Time: 08/18/18  6:16 PM  Result Value Ref Range Status   Specimen Description ABSCESS  Final   Special Requests PENOSCROTAL SWABS  Final   Gram Stain   Final    NO WBC SEEN ABUNDANT GRAM POSITIVE COCCI MODERATE GRAM NEGATIVE RODS MODERATE GRAM POSITIVE RODS    Culture   Final    CULTURE REINCUBATED FOR BETTER GROWTH Performed at [facility], [place] [address] [place], [place] [address]    Report Status PENDING  Incomplete    RADIOLOGY STUDIES/RESULTS: Ct Abdomen Pelvis Wo Contrast  Result Date: 08/13/2018 CLINICAL DATA:  Groin swelling EXAM: CT ABDOMEN AND PELVIS WITHOUT CONTRAST TECHNIQUE: Multidetector CT imaging of the abdomen and pelvis was performed following the standard protocol without IV contrast. COMPARISON:  MRI 07/10/2018 FINDINGS: Lower chest: Lung bases demonstrate no acute consolidation or effusion. The heart size is normal. Small hiatal hernia. Hepatobiliary: No focal liver abnormality is seen. No gallstones, gallbladder wall thickening, or biliary dilatation. Pancreas: Unremarkable. No pancreatic ductal dilatation or surrounding inflammatory changes. Spleen: Normal  in size without focal abnormality. Adrenals/Urinary Tract: Adrenal glands are within normal limits. No hydronephrosis. Thick-walled urinary bladder Stomach/Bowel: Stomach is nonenlarged. No dilated small bowel. Extensive gas collection Between the rectum and prostate. Vascular/Lymphatic: Mild aortic atherosclerosis. No aneurysm. No significantly enlarged lymph nodes. Reproductive: Metallic densities along the posterior aspect of the  prostate. Large gas collections around the prostate gland. Moderate edema of the scrotum and around the penis. Other: No free air. Fat in the left inguinal canal. Edema within the pubic soft tissues. Gas and fluid collection extending from the rectal area and along both sides of the prostate gland. Musculoskeletal: No acute or suspicious abnormality. IMPRESSION: 1. Moderate edema and mild inflammatory changes involving the scrotum and soft tissues about the penis, but without soft tissue emphysema. Mild edema within the pubic soft tissues. 2. Abnormal gas and fluid collection between the rectum and prostate gland and extending along the right and left aspects of the prostate gland, felt to correspond to previously demonstrated rectal ulcer and sinus tracks. 3. Thick-walled appearance of the urinary bladder, possible cystitis Electronically Signed   By: Donavan Foil M.D.   On: 08/13/2018 23:19   Dg Chest 2 View  Result Date: 08/13/2018 CLINICAL DATA:  Acute onset of genital swelling. Fever. EXAM: CHEST - 2 VIEW COMPARISON:  Chest radiograph performed 07/07/2018 FINDINGS: The lungs are well-aerated. Minimal left basilar atelectasis is noted. There is no evidence of pleural effusion or pneumothorax. The heart is normal in size; the mediastinal contour is within normal limits. No acute osseous abnormalities are seen. IMPRESSION: Minimal left basilar atelectasis noted. Lungs otherwise clear. Electronically Signed   By: Garald Balding M.D.   On: 08/13/2018 22:16   Ct Pelvis Wo Contrast  Result Date: 08/17/2018 CLINICAL DATA:  Proctitis with micro perforation, now with abscess at penile shaft, worsening pelvic swelling EXAM: CT PELVIS WITHOUT CONTRAST TECHNIQUE: Multidetector CT imaging of the pelvis was performed following the standard protocol without intravenous contrast. COMPARISON:  CT abdomen/pelvis dated 09/09/2018. MR pelvis dated 07/10/2018. FINDINGS: Motion degraded images. Urinary Tract: Thick-walled  bladder with indwelling Foley catheter and nondependent gas. Bowel: Visualized bowel is poorly evaluated but grossly unremarkable. Vascular/Lymphatic: Mild atherosclerotic calcifications of the bilateral iliac vessels. No suspicious pelvic lymphadenopathy. Reproductive: Gas within the bilateral seminal vesicles. Additional gas along the retro prostatic soft tissues (series 3/image 37) and extending along the right pelvic sidewall (series 3/image 31), unchanged. Fiducial radiation markers along the posterior prostate. One of these markers is now displaced posterior to the rectum (series 3/image 29), new from recent CT. Other: Subcutaneous stranding along the anterior lower pelvic wall, left greater than right (series 3/image 32), progressive. Additional fluid/stranding along the proximal penile shaft (series 3/image 54) with diffuse scrotal edema (series 3/image 68). This appearance is compatible with cellulitis. No drainable fluid collection/abscess on CT. Musculoskeletal: Visualized osseous structures are within normal limits. IMPRESSION: Gas along the bilateral seminal vesicles and retro prostatic soft tissues, likely related to known rectal microperforation, unchanged. Associated cellulitis involving the lower pelvic wall, penile shaft, and scrotum. No drainable fluid collection/abscess on CT. Displaced fiducial radiation marker now posterior to the rectum, new from recent CT. Electronically Signed   By: Julian Hy M.D.   On: 08/17/2018 11:34   US Renal  Result Date: 08/14/2018 CLINICAL DATA:  Acute renal failure. EXAM: RENAL / URINARY TRACT ULTRASOUND COMPLETE COMPARISON:  Noncontrast CT yesterday. FINDINGS: Right Kidney: Renal measurements: 12.6 x 5.2 x 6.1 cm = volume: 207 mL . Echogenicity within  normal limits. No mass or hydronephrosis visualized. Left Kidney: Renal measurements: 12.8 x 5.1 x 5.7 cm = volume: 195 mL. Echogenicity within normal limits. No mass or hydronephrosis visualized. Bladder:  Bladder wall thickening of 9 mm, as seen on CT. IMPRESSION: 1. Bladder wall thickening, similar to CT yesterday. 2. Unremarkable sonographic appearance of both kidneys. No hydronephrosis. Electronically Signed   By: Keith Rake M.D.   On: 08/14/2018 03:48   US Scrotum W/doppler  Result Date: 08/13/2018 CLINICAL DATA:  Initial evaluation for acute testicular pain, swelling. EXAM: SCROTAL ULTRASOUND DOPPLER ULTRASOUND OF THE TESTICLES TECHNIQUE: Complete ultrasound examination of the testicles, epididymis, and other scrotal structures was performed. Color and spectral Doppler ultrasound were also utilized to evaluate blood flow to the testicles. COMPARISON:  None. FINDINGS: Right testicle Measurements: 3.5 x 2.5 x 1.9 cm. No mass lesion. Testicular microlithiasis noted. Left testicle Measurements: 3.9 x 2.2 x 2.3 cm. No mass lesion. Testicular microlithiasis noted. Right epididymis:  Normal in size and appearance. Left epididymis: Diffusely increased vascularity seen within the left epididymis, suggesting acute epididymitis. Hydrocele:  Bilateral hydroceles noted. Varicocele:  Bilateral varicoceles noted. Pulsed Doppler interrogation of both testes demonstrates normal low resistance arterial and venous waveforms bilaterally. Incidental note made of a probable small fat containing right inguinal hernia. IMPRESSION: 1. Increased vascularity within the left epididymis, suggesting acute epididymitis. No evidence for testicular torsion. 2. Small moderate bilateral hydroceles, likely reactive. 3. Small bilateral varicoceles. 4. Probable small fat containing right inguinal hernia. 5. Testicular microlithiasis. Current literature suggests that testicular microlithiasis is not a significant independent risk factor for development of testicular carcinoma, and that follow up imaging is not warranted in the absence of other risk factors. Monthly testicular self-examination and annual physical exams are considered  appropriate surveillance. If patient has other risk factors for testicular carcinoma, then referral to Urology should be considered. (Reference: DeCastro, et al.: A 5-Year Follow up Study of Asymptomatic Men with Testicular Microlithiasis. J Urol 2008; 381:8299-3716.) Electronically Signed   By: Jeannine Boga M.D.   On: 08/13/2018 22:48   US Abdomen Limited Ruq  Result Date: 08/14/2018 CLINICAL DATA:  Initial evaluation for elevated AST with leukocytosis. EXAM: ULTRASOUND ABDOMEN LIMITED RIGHT UPPER QUADRANT COMPARISON:  None. FINDINGS: Gallbladder: No gallstones or wall thickening visualized. No sonographic Murphy sign noted by sonographer. Common bile duct: Diameter: 4.6 mm Liver: No focal lesion identified. Within normal limits in parenchymal echogenicity. Portal vein is patent on color Doppler imaging with normal direction of blood flow towards the liver. IMPRESSION: Normal right upper quadrant ultrasound. No cholelithiasis, evidence for acute cholecystitis, or biliary dilatation. Electronically Signed   By: Jeannine Boga M.D.   On: 08/14/2018 03:43     LOS: 5 days   Oren Binet, MD  [facility]  If 7PM-7AM, please contact night-coverage  Please page via [email or web address]-click on MD name and type text message  08/19/2018, 1:17 PM

## 2018-08-19 NOTE — Progress Notes (Signed)
Patient is status post surgical Cystoscopy, I&D  Penal scrotal abscess.  Patient arrived from PACU at 840pm.  Alert and oriented, area to incision assessed draining serosanguineous drainage. Dressing changed twice this shift, prn oxycodone administered x2 this shift for pain.  Patient remained alert and oriented, no acute distress noted.

## 2018-08-19 NOTE — Progress Notes (Signed)
1 Day Post-Op   Subjective/Chief Complaint:  1 - Prostate Cancer, Likely Recurrent - s/p primary external beam radiation completed 03/2017 in [place] for larger volume Gleason 6 disease. Initial PSA 5.5 and diagnosed 2017 by Dr. Alyson Ingles.  Recent PSA Course: 06/2017 - PSA 1.1 06/2018 - PSA 2.6   2 - Acute Renal Failure - Cr 7.2 / K 5.0 by ER labs 08/14/18 on eval sepsis. [place] and CT w/o hydro or distend bladder. Baseline Cr 1.1 as recently as 07/2018.  3 - Sepsis of GI/GU Origin with Prostatitis / Epididymitis / Colitis / Severe Penoscrotal Infection - lactate 3.8, WBC 42k by ET labs 08/14/18 on eval fevers and malaise. UA unremarkable, UCX negative. [place] 1/3 Egertella Scrotal US and Pelvic CT with inflmmation of epidydimis / prostate / peri-rectal gas c/w likely infection stemming from colon microperf seeding prostate and GU tract. Initially had good systemic response to IV ABX but worsened penoscrotal skin swelling and then some purlulent drainage 1/7, Repeat CT 1/7 with some progression of peri-rectal gas and new displacement of prior fiducial marker (confirming hole in rectum) and still no large fluid collections / sub-Q gas and exam with new draining sinus tracts at penoscrotal junction. 1st stage OR penoscrotal wound debridment 1/8, 3 large penrose drains place, approx 5cm2 non-viable skin removed. New Wound CX 1/8 -mixed / pending.    4 - Rectal Ulcer/Suspect Perforation - known DX rectal ulcer with periodic bleeding following prostate radiation 2018. Flex-Sig / BX 06/2018 of ulcer edges negative for malignancy. CT 08/14/18 with gas in peri-rectal / peri-prostatic space c/w likely microperforation.   Today " March" is continuing to improve systemically. Afebrile now and HR also trending down after wound debridement yesterday.    Objective: Vital signs in last 24 hours: Temp:  [97.6 F (36.4 C)-98.8 F (37.1 C)] 97.9 F (36.6 C) (01/09 1151) Pulse Rate:  [89-106] 99 (01/09 0939) Resp:   [12-24] 16 (01/09 0939) BP: (107-143)/(50-80) 128/68 (01/09 0939) SpO2:  [96 %-97 %] 96 % (01/09 0518) Last BM Date: 08/18/18  Intake/Output from previous day: 01/08 0701 - 01/09 0700 In: 1358 [P.O.:480; I.V.:778; IV Piggyback:100] Out: 1850 [Urine:1750; Blood:100] Intake/Output this shift: No intake/output data recorded.  EXAM: Head: Normocephalic.  Eyes: Pupils are equal, round, and reactive to light.  Neck: Normal range of motion.  Cardiovascular:  Improved Heart rate, now upper 70s by bedside monitor and regular.  Respiratory:  Non-labored on room air GI: Soft. There is no abdominal tenderness. There is no rebound and no guarding.  Genitourinary:    Genitourinary Comments: Recent surgical drians with much improved / decreased purlulence and local swelling of penis, scrotum, perineum.  Musculoskeletal: Normal range of motion.  Neurological: He is alert.  Skin: Skin is warm.  Psychiatric: He has a normal mood and affect. His behavior is normal.  Lab Results:  Recent Labs    08/18/18 0359 08/19/18 0339  WBC 14.9* 12.0*  HGB 8.1* 7.4*  HCT 24.2* 23.0*  PLT 423* 496*   BMET Recent Labs    08/18/18 0359 08/19/18 0737  NA 135 134*  K 4.5 4.4  CL 104 106  CO2 21* 21*  GLUCOSE 86 172*  BUN 46* 38*  CREATININE 2.01* 1.55*  CALCIUM 8.0* 8.0*   PT/INR No results for input(s): LABPROT, INR in the last 72 hours. ABG No results for input(s): PHART, HCO3 in the last 72 hours.  Invalid input(s): PCO2, PO2  Studies/Results: No results found.  Anti-infectives: Anti-infectives (From  admission, onward)   Start     Dose/Rate Route Frequency Ordered Stop   08/17/18 1400  metroNIDAZOLE (FLAGYL) tablet 500 mg     500 mg Oral Every 8 hours 08/17/18 1303     08/16/18 2000  ceFEPIme (MAXIPIME) 2 g in sodium chloride 0.9 % 100 mL IVPB     2 g 200 mL/hr over 30 Minutes Intravenous Every 24 hours 08/16/18 1328     08/15/18 1030  vancomycin (VANCOCIN) 1,250 mg in sodium  chloride 0.9 % 250 mL IVPB     1,250 mg 166.7 mL/hr over 90 Minutes Intravenous  Once 08/15/18 0942 08/15/18 1444   08/14/18 2000  ceFEPIme (MAXIPIME) 1 g in sodium chloride 0.9 % 100 mL IVPB  Status:  Discontinued     1 g 200 mL/hr over 30 Minutes Intravenous Every 24 hours 08/13/18 2052 08/16/18 1328   08/13/18 2052  vancomycin variable dose per unstable renal function (pharmacist dosing)  Status:  Discontinued      Does not apply See admin instructions 08/13/18 2052 08/16/18 1442   08/13/18 2045  ceFEPIme (MAXIPIME) 2 g in sodium chloride 0.9 % 100 mL IVPB     2 g 200 mL/hr over 30 Minutes Intravenous  Once 08/13/18 2038 08/13/18 2141   08/13/18 2045  metroNIDAZOLE (FLAGYL) IVPB 500 mg  Status:  Discontinued     500 mg 100 mL/hr over 60 Minutes Intravenous Every 8 hours 08/13/18 2038 08/17/18 1303   08/13/18 2045  vancomycin (VANCOCIN) IVPB 1000 mg/200 mL premix     1,000 mg 200 mL/hr over 60 Minutes Intravenous  Once 08/13/18 2038 08/13/18 2333      Assessment/Plan:   1 - Prostate Cancer, Likely Recurrent - PSA pattern most c/w active disease, but no signs of metastatic involvement. May warrant tissue sampling via transperineal approach in elective setting. No further diagnostics or intervention this admission.   2 - Acute Renal Failure - Improving, likely pre-renal due to sepsis. No hydro to warrant stentig / neph tubes. Rec continued foley for accurate UOP monitoring, appreciate nephrology comanagement.   3 - Sepsis of GI/GU Origin with Prostatitis / Epididymitis / Colitis / Severe Penoscrotal Infection - Improving sytemically and locally on IV ABX and s/p local wound debridement.   Agree with current ABX pending further CX data from 1/8.   4 - Rectal Ulcer/Suspect Perforation - likely from progression of known radiation-induced rectal ulcer.  I strongly feel pt will need GI diversion with end colostomy to break his cycle of severe infecitons / readmissions. I have discussed  this with him at length and he is amenable.  Tentatively rec combined gen surg / urology surgery Monday 1/13 with end colostomy and repeat wound debridement.    Alexis Frock 08/19/2018

## 2018-08-19 NOTE — NC FL2 (Signed)
[place] LEVEL OF CARE SCREENING TOOL     IDENTIFICATION  Patient Name: Phillip Blackwell Birthdate: July 09, 1961 Sex: male Admission Date (Current Location): 08/13/2018  [facility] and [place] Number:  [employer] and Address:  [facility] [place] [address] [place], [place] [address]      Provider Number: [id number]  Attending Physician Name and Address:  Jonetta Osgood, MD  Relative Name and Phone Number:  Luretha Rued, spouse [phone]    Current Level of Care: Hospital Recommended Level of Care: [place] Prior Approval Number:    Date Approved/Denied:   PASRR Number: [id number]  Discharge Plan: SNF    Current Diagnoses: Patient Active Problem List   Diagnosis Date Noted  . Malnutrition of moderate degree 08/19/2018  . Sepsis ([place]) 08/18/2018  . Severe sepsis ([place]) 08/14/2018  . Acute renal failure (ARF) ([place]) 08/14/2018  . Serum total bilirubin elevated 08/14/2018  . Hematochezia 08/14/2018  . Hyponatremia 08/14/2018  . Normal anion gap metabolic acidosis 22/63/3354  . 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 OSA (obstructive sleep apnea) 08/14/2018  . Lower GI bleed 07/07/2018  . HTN (hypertension) 07/07/2018  . HLD (hyperlipidemia) 07/07/2018  . Prostate cancer ([place]) 07/07/2018    Orientation RESPIRATION BLADDER Height & Weight     Self, Time, Situation, Place  Normal Continent, Indwelling catheter Weight: 86.2 kg Height:  6' (182.9 cm)  BEHAVIORAL SYMPTOMS/MOOD NEUROLOGICAL BOWEL NUTRITION STATUS      Continent Diet(Please see DC Summary)  AMBULATORY STATUS COMMUNICATION OF NEEDS Skin   Extensive Assist Verbally Surgical wounds(Closed incision on perineum;)                       Personal Care Assistance Level of Assistance  Bathing, Feeding, Dressing Bathing Assistance: Maximum assistance Feeding assistance: Independent Dressing Assistance: Limited assistance     Functional  Limitations Info  Sight, Hearing, Speech Sight Info: Impaired Hearing Info: Adequate Speech Info: Adequate    SPECIAL CARE FACTORS FREQUENCY  PT (By licensed PT), OT (By licensed OT)     PT Frequency: 5x/week OT Frequency: 3x/week            Contractures Contractures Info: Not present    Additional Factors Info  Code Status, Allergies Code Status Info: 5G25 Allergies Info: NKA           Current Medications (08/19/2018):  This is the current hospital active medication list Current Facility-Administered Medications  Medication Dose Route Frequency Provider Last Rate Last Dose  . 0.9 %  sodium chloride infusion   Intravenous PRN Alexis Frock, MD   Stopped at 08/17/18 [phone]  . acetaminophen (TYLENOL) tablet 650 mg  650 mg Oral Q6H PRN Alexis Frock, MD   650 mg at 08/18/18 0429   Or  . acetaminophen (TYLENOL) suppository 650 mg  650 mg Rectal Q6H PRN Alexis Frock, MD      . atorvastatin (LIPITOR) tablet 40 mg  40 mg Oral Q supper Alexis Frock, MD   40 mg at 08/17/18 1635  . ceFEPIme (MAXIPIME) 2 g in sodium chloride 0.9 % 100 mL IVPB  2 g Intravenous Q24H Alexis Frock, MD 200 mL/hr at 08/18/18 2229 2 g at 08/18/18 2229  . Chlorhexidine Gluconate Cloth 2 % PADS 6 each  6 each Topical Daily Alexis Frock, MD   6 each at 08/19/18 1010  . cholecalciferol (VITAMIN D3) tablet 2,000 Units  2,000 Units Oral Daily Alexis Frock, MD  2,000 Units at 08/19/18 0943  . clonazePAM (KLONOPIN) tablet 1 mg  1 mg Oral QHS PRN Alexis Frock, MD   1 mg at 08/17/18 2121  . feeding supplement (ENSURE ENLIVE) (ENSURE ENLIVE) liquid 237 mL  237 mL Oral BID BM Alexis Frock, MD   237 mL at 08/17/18 1444  . ferrous sulfate tablet 325 mg  325 mg Oral Q breakfast Alexis Frock, MD   325 mg at 08/19/18 0943  . lactated ringers infusion   Intravenous Continuous Alexis Frock, MD 10 mL/hr at 08/18/18 2212    . metoprolol tartrate (LOPRESSOR) tablet 50 mg  50 mg Oral BID Alexis Frock, MD   50 mg at 08/19/18 0944  . metroNIDAZOLE (FLAGYL) tablet 500 mg  500 mg Oral Q8H Alexis Frock, MD   500 mg at 08/19/18 1328  . multivitamin with minerals tablet 1 tablet  1 tablet Oral Daily Alexis Frock, MD   1 tablet at 08/19/18 [phone]  . mupirocin ointment (BACTROBAN) 2 % 1 application  1 application Nasal BID Alexis Frock, MD   1 application at 68/34/19 [phone]  . ondansetron (ZOFRAN) injection 4 mg  4 mg Intravenous Q6H PRN Alexis Frock, MD   4 mg at 08/14/18 1653  . oxyCODONE (Oxy IR/ROXICODONE) immediate release tablet 5 mg  5 mg Oral Q6H PRN Alexis Frock, MD   5 mg at 08/19/18 1328  . pantoprazole (PROTONIX) EC tablet 40 mg  40 mg Oral BID Gabriel Carina, MD   40 mg at 08/19/18 0943  . polycarbophil (FIBERCON) tablet 625 mg  625 mg Oral BID Alexis Frock, MD   625 mg at 08/19/18 0942  . prazosin (MINIPRESS) capsule 2 mg  2 mg Oral QHS Alexis Frock, MD   2 mg at 08/18/18 2153  . tamsulosin (FLOMAX) capsule 0.4 mg  0.4 mg Oral Daily PRN Alexis Frock, MD         Discharge Medications: Please see discharge summary for a list of discharge medications.  Relevant Imaging Results:  Relevant Lab Results:   Additional Information SSN: [place], [place]

## 2018-08-19 NOTE — Op Note (Addendum)
NAME: Phillip Blackwell MEDICAL RECORD JK:[id number] ACCOUNT [id number] DATE OF BIRTH:[birth year] FACILITY: [facility] LOCATION: [facility] PHYSICIAN:Amrom Ore, MD  OPERATIVE REPORT  DATE OF PROCEDURE:  08/18/2018  PREOPERATIVE DIAGNOSIS:  Rectal perforation with a complex gastrointestinal and genitourinary infection including large penoscrotal abscesses and cellulitis.  PROCEDURE:   1.  First stage debridement of complex penoscrotal. 2.  Cystoscopy.  ESTIMATED BLOOD LOSS:  100 mL.  COMPLICATIONS:  None.  SPECIMENS: 1.  Penoscrotal tissue for culture. 2.  A penoscrotal wound for culture.  FINDINGS: 1.  Approximately 5 cm2 nonviable skin overlying the penoscrotal junction. 2.  Very large, complex abscess fluid collection tracking from the perineum to the scrotum to the infraumbilical anterior abdominal wall skin. 3.  Fixed high=-riding prostate.  No evidence of GI, GU fistula.  INDICATIONS:  The patient is a very unfortunate but quite pleasant 58 year old gentleman status post radiation for prostate cancer at a referring facility years ago.  He has had multiple complications since this including severe rectal ulcer with new  perforation.  He has had multiple infections from this.  He now has a very complex GI and GU infection including progressive penoscrotal abscess and soft tissue infection that is not frankly necrotizing.  He has been seen by my service, general surgery  service and the hospitalist service.  He initially appeared septic.  He underwent stabilization and intravenous antibiotics.  His penoscrotal wound was given an initial trial of antibiotic therapy alone; however, has declared itself, given new drainage.   Again, does not appear to be necrotizing based on repeat imaging.  It was clearly felt that operative debridement was warranted.  General surgery team also corroborated the need for colostomy, but not at this time point.  Informed consent was obtained  and placed in  medical record.  DESCRIPTION OF PROCEDURE:  The patient identified being Phillip Blackwell, procedure being first-stage debridement of complex penoscrotal was confirmed.  Procedure encounter form.  Intravenous antibiotics were verified.  General endotracheal anesthesia  induced.  The patient was placed into a low lithotomy position, sterile field was created, prepped and draped base of the penis, perineum and proximal thighs using copious Iodine.  In situ Foley catheter had been removed.    Cystourethroscopy was performed using 21-French rigid cystoscope vessel lens.  Inspection of anterior and posterior urethra revealed only a fixed riding prostate consistent with high dose prior radiation.  There was no evidence of obvious GI, GU fistula.   Inspection of the bladder neck revealed no additional findings.  A new Foley catheter was placed sterilely per urethra straight drain.  Approximately 10 mL that were in the balloon, temp probe type.  The patient had already developed a sinus tract at  the superior base of his penis.  This was opened up and using a surgeon's finger, this was explored.  This tracks to the scrotum as well as the inferior abdominal wall, but did not obviously involve fascia, was not necrotizing.  These tracts were further  developed and drained.  A counter incision was made in the dependent scrotum, such that the tracking was carried along this.  A separate small counter incision was made in the anterior perineum.  Again, this all tracked and connected together.  The area  was then irrigated with a Pulsavac irrigation via these incisions with approximately 4000 mL of saline, which resulted in excellent debridement of the wound.  Additional sharp surgery debridement was performed of the skin overlying sinus tracts for a  total surface  area of approximately 5 cm2 removed.  Exquisite care and attention was taken to minimal removal of tissue distally, the nonviable.  Multiple drains were then  placed including a half 3-inch Penrose drain tracking from his anterior peritoneum  through the inferior scrotal incision.  This was anchored in place using nylon and 2 additional half 3-inch Penrose drains.  One from the right superior base of the scrotum towards the inferior scrotum and another contralaterally.  These were anchored  in place with interrupted nylon.  We achieved the goals of surgery today.  The wound had been maximally debrided as a temporizing measure.  I again strongly feel the patient will require end colostomy, as he will no doubt have recurrent infections until a diversion.  AN/NUANCE  D:08/18/2018 T:08/19/2018 JOB:[id number]  5cm2 of skin EXCISED.

## 2018-08-20 LAB — CBC
HEMATOCRIT: 23.1 % — AB (ref 39.0–52.0)
Hemoglobin: 7.2 g/dL — ABNORMAL LOW (ref 13.0–17.0)
MCH: 27.8 pg (ref 26.0–34.0)
MCHC: 31.2 g/dL (ref 30.0–36.0)
MCV: 89.2 fL (ref 80.0–100.0)
Platelets: 560 10*3/uL — ABNORMAL HIGH (ref 150–400)
RBC: 2.59 MIL/uL — ABNORMAL LOW (ref 4.22–5.81)
RDW: 15.9 % — ABNORMAL HIGH (ref 11.5–15.5)
WBC: 11.5 10*3/uL — ABNORMAL HIGH (ref 4.0–10.5)
nRBC: 0 % (ref 0.0–0.2)

## 2018-08-20 LAB — BASIC METABOLIC PANEL
Anion gap: 9 (ref 5–15)
BUN: 33 mg/dL — ABNORMAL HIGH (ref 6–20)
CO2: 21 mmol/L — ABNORMAL LOW (ref 22–32)
Calcium: 7.8 mg/dL — ABNORMAL LOW (ref 8.9–10.3)
Chloride: 105 mmol/L (ref 98–111)
Creatinine, Ser: 1.43 mg/dL — ABNORMAL HIGH (ref 0.61–1.24)
GFR calc Af Amer: >60 mL/min (ref >60)
GFR calc non Af Amer: 53 mL/min — ABNORMAL LOW (ref >60)
Glucose, Bld: 111 mg/dL — ABNORMAL HIGH (ref 70–99)
Potassium: 3.9 mmol/L (ref 3.5–5.1)
Sodium: 135 mmol/L (ref 135–145)

## 2018-08-20 MED ORDER — SODIUM CHLORIDE 0.9 % IV SOLN
2.0000 g | Freq: Two times a day (BID) | INTRAVENOUS | Status: DC
  Filled 2018-08-20: qty 2

## 2018-08-20 MED ORDER — SODIUM CHLORIDE 0.9 % IV SOLN
3.0000 g | Freq: Four times a day (QID) | INTRAVENOUS | Status: DC
  Administered 2018-08-20 – 2018-08-23 (×11): 3 g via INTRAVENOUS
  Filled 2018-08-20 (×14): qty 3

## 2018-08-20 MED ORDER — MUPIROCIN 2 % EX OINT
1.0000 "application " | TOPICAL_OINTMENT | Freq: Two times a day (BID) | CUTANEOUS | Status: DC
  Administered 2018-08-20 – 2018-08-22 (×3): 1 via TOPICAL

## 2018-08-20 NOTE — Progress Notes (Addendum)
Pharmacy Antibiotic Note  Phillip Blackwell is a 58 y.o. male admitted on 08/13/2018 with sepsis. Patient with blood cultures growing rare organism: EGGERTHELLA LENTA. Dr. Sloan Leiter discussed the case with ID today and the plan is to narrow therapy to Unasyn since pt is improving with the plan to transition to PO Augmentin after and extended course.   Crcl>>61 ml/min  Plan:  Unasyn 3g IV q6   Height: 6' (182.9 cm) Weight: 190 lb (86.2 kg) IBW/kg (Calculated) : 77.6  Temp (24hrs), Avg:98.4 F (36.9 C), Min:97.8 F (36.6 C), Max:98.9 F (37.2 C)  Recent Labs  Lab 08/13/18 2023 08/13/18 2313  08/15/18 0817  08/16/18 0327 08/17/18 0506 08/18/18 0359 08/19/18 0339 08/19/18 0737 08/20/18 0522  WBC  --   --    < >  --    < > 18.5* 16.2* 14.9* 12.0*  --  11.5*  CREATININE  --   --    < >  --   --  3.15* 2.41* 2.01*  --  1.55* 1.43*  LATICACIDVEN 3.80* 1.95*  --   --   --   --   --   --   --   --   --   VANCORANDOM  --   --   --  9  --   --   --   --   --   --   --    < > = values in this interval not displayed.    Estimated Creatinine Clearance: 61 mL/min (A) (by C-G formula based on SCr of 1.43 mg/dL (H)).    No Known Allergies  Antimicrobials this admission: Vanc 01/03 >> 1/6 Cefepime 01/03 >>1/10 Flagyl 1/3>>1/10 Unasyn 1/10>>  Microbiology Results: 1/3 BCx: EGGERTHELLA LENTA 1/3 UCx: NGF 1/8 Penoscrotal cx>>Mixed gram stain 1/5 MRSA PCR: negative   Onnie Boer, PharmD, BCIDP, AAHIVP, CPP Infectious Disease Pharmacist 08/20/2018 2:14 PM

## 2018-08-20 NOTE — Progress Notes (Signed)
Physical Therapy Treatment Patient Details Name: Phillip Blackwell MRN: [id number] DOB: 10-Mar-1961 Today's Date: 08/20/2018    History of Present Illness Pt is a 58 y.o. male with medical history significant of anxiety, depression, GERD, hypertension, CVA, OSA presenting to the hospital for evaluation of groin pain and swelling , fever, blood in stool, work-up was significant for sepsis, acute blood loss anemia, is admitted for further work-up, this is secondary to colitis/prostatitis/epididymitis from perforated rectal ulcer    PT Comments    Pt received semirecumbent in bed, awake, but flat affect and hypophonic. Pt reports feeling better since procedure 1DA, but does endorse ABD pain this date. Pt agreeable to participate, immediately responds to questioning with short, often single word responses. Pt very weak in general, some exercises requires maxA for limb A/ROM. Pain not a limiting factor. After session, moving in bed, and upright repositioning, pt has hiccups followed by erructation.   Follow Up Recommendations  CIR;Supervision/Assistance - 24 hour     Equipment Recommendations  None recommended by PT    Recommendations for Other Services       Precautions / Restrictions Precautions Precautions: Fall Restrictions Weight Bearing Restrictions: No    Mobility  Bed Mobility Overal bed mobility: Needs Assistance Bed Mobility: (scootign toward HOB)           General bed mobility comments: Requires Total Assist; pt positioned into hip/knee flexion from bridge assist, which is minimal   Transfers                    Ambulation/Gait                 Stairs             Wheelchair Mobility    Modified Rankin (Stroke Patients Only)       Balance                                            Cognition Arousal/Alertness: Awake/alert Behavior During Therapy: WFL for tasks assessed/performed Overall Cognitive Status: Within  Functional Limits for tasks assessed                         Following Commands: Follows multi-step commands consistently              Exercises General Exercises - Lower Extremity Ankle Circles/Pumps: AROM;15 reps;Both;Supine Quad Sets: AROM;Supine;Both;15 reps Gluteal Sets: AROM;Supine;Both;15 reps Short Arc Quad: AROM;Supine;Both;15 reps Heel Slides: AAROM;Supine;Both;15 reps;Limitations Heel Slides Limitations: requires modA physical assist 2/2 weakness Hip ABduction/ADduction: AAROM;Supine;Both;Limitations;15 reps Hip Abduction/Adduction Limitations: requires modA physical assist 2/2 weakness Straight Leg Raises: AAROM;Supine;15 reps;Both;Limitations Straight Leg Raises Limitations: requires modA (Lt) MaxA (Rt) physical assist 2/2 weakness Hip Flexion/Marching: Supine;AAROM;Both;15 reps;Limitations Hip Flexion/Marching Limitations: requires minA (Lt) MaxA (Rt) physical assist 2/2 weakness Other Exercises Other Exercises: Cervical retraction into 2 pillows: 10x3sec VC for low chin Other Exercises: Cervical rotation A/ROM 1x10 bilat (reclined)     General Comments        Pertinent Vitals/Pain Pain Score: 7  Pain Location: stomach pain  Pain Descriptors / Indicators: Aching Pain Intervention(s): Limited activity within patient's tolerance;Monitored during session;Repositioned    Home Living                      Prior Function  PT Goals (current goals can now be found in the care plan section) Acute Rehab PT Goals Patient Stated Goal: to get stronger PT Goal Formulation: With patient/family Time For Goal Achievement: 08/31/18 Potential to Achieve Goals: Good Progress towards PT goals: Not progressing toward goals - comment;PT to reassess next treatment    Frequency    Min 3X/week      PT Plan Current plan remains appropriate    Co-evaluation              AM-PAC PT "6 Clicks" Mobility   Outcome Measure  Help needed  turning from your back to your side while in a flat bed without using bedrails?: Total Help needed moving from lying on your back to sitting on the side of a flat bed without using bedrails?: Total Help needed moving to and from a bed to a chair (including a wheelchair)?: Total Help needed standing up from a chair using your arms (e.g., wheelchair or bedside chair)?: Total Help needed to walk in hospital room?: Total Help needed climbing 3-5 steps with a railing? : Total 6 Click Score: 6    End of Session   Activity Tolerance: Patient limited by fatigue;Patient limited by lethargy Patient left: in bed;with call bell/phone within reach;with SCD's reapplied;with bed alarm set(sittign up tall in bed, foot of bed slightly lowered to allow for knee extension ad lib. )   PT Visit Diagnosis: Muscle weakness (generalized) (M62.81);Difficulty in walking, not elsewhere classified (R26.2)     Time: 1035-1100 PT Time Calculation (min) (ACUTE ONLY): 25 min  Charges:  $Therapeutic Exercise: 8-22 mins $Therapeutic Activity: 8-22 mins                     11:13 AM, 08/20/18 Etta Grandchild, PT, DPT Physical Therapist - [place] [phone] (Pager)  [phone] (Office)     , C 08/20/2018, 11:11 AM

## 2018-08-20 NOTE — Plan of Care (Signed)
  Problem: Education: Goal: Knowledge of General Education information will improve Description: Including pain rating scale, medication(s)/side effects and non-pharmacologic comfort measures Outcome: Progressing   Problem: Clinical Measurements: Goal: Ability to maintain clinical measurements within normal limits will improve Outcome: Progressing Goal: Respiratory complications will improve Outcome: Progressing Goal: Cardiovascular complication will be avoided Outcome: Progressing   Problem: Safety: Goal: Ability to remain free from injury will improve Outcome: Progressing   

## 2018-08-20 NOTE — Progress Notes (Signed)
Phillip [facility]well Progress Note  2 Days Post-Op  Subjective: CC-  Patient reports persistent lower abdominal pain. Denies n/v. Tolerating diet. He reports 2 loose BMs yesterday. Good UOP. WBC and creatinine trending down. Urology recommending pt will need GI diversion with end colostomy to break his cycle of severe infecitons / readmissions; patient amenable. Tentatively rec combined gen surg / urology [facility] Monday 1/13 with end colostomy and repeat wound debridement.   Objective: Vital signs in last 24 hours: Temp:  [97.8 F (36.6 C)-98.9 F (37.2 C)] 97.8 F (36.6 C) (01/10 0530) Pulse Rate:  [78-87] 87 (01/10 0530) Resp:  [20] 20 (01/09 2334) BP: (120-143)/(66-75) 143/75 (01/10 0530) SpO2:  [97 %] 97 % (01/10 0530) Last BM Date: 08/20/18  Intake/Output from previous day: 01/09 0701 - 01/10 0700 In: 1450  Out: -  Intake/Output this shift: No intake/output data recorded.  PE: Gen:  Alert, NAD, pleasant HEENT: EOM's intact, pupils equal and round Pulm:  effort normal, pulling 500 on IS Abd: Soft, ND, +BS, no HSM, mild lower abdominal TTP GU: edema noted to penis and scrotum, penrose in place with purulent drainage  Lab Results:  Recent Labs    08/19/18 0339 08/20/18 0522  WBC 12.0* 11.5*  HGB 7.4* 7.2*  HCT 23.0* 23.1*  PLT 496* 560*   BMET Recent Labs    08/19/18 0737 08/20/18 0522  NA 134* 135  K 4.4 3.9  CL 106 105  CO2 21* 21*  GLUCOSE 172* 111*  BUN 38* 33*  CREATININE 1.55* 1.43*  CALCIUM 8.0* 7.8*   PT/INR No results for input(s): LABPROT, INR in the last 72 hours. CMP     Component Value Date/Time   NA 135 08/20/2018 0522   K 3.9 08/20/2018 0522   CL 105 08/20/2018 0522   CO2 21 (L) 08/20/2018 0522   GLUCOSE 111 (H) 08/20/2018 0522   BUN 33 (H) 08/20/2018 0522   CREATININE 1.43 (H) 08/20/2018 0522   CALCIUM 7.8 (L) 08/20/2018 0522   PROT 6.2 (L) 08/19/2018 0339   ALBUMIN 1.2 (L) 08/19/2018 0339   AST 42 (H) 08/19/2018 0339    ALT 31 08/19/2018 0339   ALKPHOS 66 08/19/2018 0339   BILITOT 1.6 (H) 08/19/2018 0339   GFRNONAA 53 (L) 08/20/2018 0522   GFRAA >60 08/20/2018 0522   Lipase     Component Value Date/Time   LIPASE 33 08/13/2018 2047       Studies/Results: No results found.  Anti-infectives: Anti-infectives (From admission, onward)   Start     Dose/Rate Route Frequency Ordered Stop   08/17/18 1400  metroNIDAZOLE (FLAGYL) tablet 500 mg     500 mg Oral Every 8 hours 08/17/18 1303     08/16/18 2000  ceFEPIme (MAXIPIME) 2 g in sodium chloride 0.9 % 100 mL IVPB     2 g 200 mL/hr over 30 Minutes Intravenous Every 24 hours 08/16/18 1328     08/15/18 1030  vancomycin (VANCOCIN) 1,250 mg in sodium chloride 0.9 % 250 mL IVPB     1,250 mg 166.7 mL/hr over 90 Minutes Intravenous  Once 08/15/18 0942 08/15/18 1444   08/14/18 2000  ceFEPIme (MAXIPIME) 1 g in sodium chloride 0.9 % 100 mL IVPB  Status:  Discontinued     1 g 200 mL/hr over 30 Minutes Intravenous Every 24 hours 08/13/18 2052 08/16/18 1328   08/13/18 2052  vancomycin variable dose per unstable renal function (pharmacist dosing)  Status:  Discontinued  Does not apply See admin instructions 08/13/18 2052 08/16/18 1442   08/13/18 2045  ceFEPIme (MAXIPIME) 2 g in sodium chloride 0.9 % 100 mL IVPB     2 g 200 mL/hr over 30 Minutes Intravenous  Once 08/13/18 2038 08/13/18 2141   08/13/18 2045  metroNIDAZOLE (FLAGYL) IVPB 500 mg  Status:  Discontinued     500 mg 100 mL/hr over 60 Minutes Intravenous Every 8 hours 08/13/18 2038 08/17/18 1303   08/13/18 2045  vancomycin (VANCOCIN) IVPB 1000 mg/200 mL premix     1,000 mg 200 mL/hr over 60 Minutes Intravenous  Once 08/13/18 2038 08/13/18 2333       Assessment/Plan Hx of prostate CA, recurrent, with external beam radiation Gleason 6 OSA - CPAP BPH Hiccups x 1 month AKI - creatinine trending down 1.43, good UOP Anemia - chronic disease  Full thickness perforation of rectal ulcer Severe  sepsis - prostatitis/epididymitis/colitis/severe penoscrotal infection - WBC trending down 11.5 -s/p 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 Tresa Moore - Agree with Urology that pt will need GI diversion with end colostomy to break his cycle of severe infecitons / readmissions; patient amenable. Patient met with WOC RN yesterday for ostomy marking and teaching; he had good questions today regarding colostomy. Still amenable to [facility].  Tentatively plan combined gen surg / urology Blackwell Monday 1/13 with end colostomy and repeat wound debridement.   ID - Maxipime 1/3>> day#8,  Flagyl 1/3>> day#8 FEN - Reg diet, Ensure VTE - SCDs Foley - in place   LOS: 6 days    Wellington Hampshire , [facility] 08/20/2018, 10:12 AM Pager: [phone] Mon 7:00 am -11:30 AM Tues-Fri 7:00 am-4:30 pm Sat-Sun 7:00 am-11:30 am

## 2018-08-20 NOTE — Progress Notes (Signed)
2 Days Post-Op   Subjective/Chief Complaint:   1 - Prostate Cancer, Likely Recurrent - s/p primary external beam radiation completed 03/2017 in [place] for larger volume Gleason 6 disease. Initial PSA 5.5 and diagnosed 2017 by Dr. Alyson Blackwell.  Recent PSA Course: 06/2017 - PSA 1.1 06/2018 - PSA 2.6   2 - Acute Renal Failure - Cr 7.2 / K 5.0 by ER labs 08/14/18 on eval sepsis. [place] and CT w/o hydro or distend bladder. Baseline Cr 1.1 as recently as 07/2018.  3 - Sepsis of GI/GU Origin with Prostatitis / Epididymitis / Colitis / Severe Penoscrotal Infection - lactate 3.8, WBC 42k by ET labs 08/14/18 on eval fevers and malaise. UA unremarkable, UCX negative. [place] 1/3 [place] Scrotal US and Pelvic CT with inflmmation of epidydimis / prostate / peri-rectal gas c/w likely infection stemming from colon microperf seeding prostate and GU tract. Initially had good systemic response to IV ABX but worsened penoscrotal skin swelling and then some purlulent drainage 1/7, Repeat CT 1/7 with some progression of peri-rectal gas and new displacement of prior fiducial marker (confirming hole in rectum) and still no large fluid collections / sub-Q gas and exam with new draining sinus tracts at penoscrotal junction. 1st stage OR penoscrotal wound debridment 1/8, 3 large penrose drains place, approx 5cm2 non-viable skin removed. New Wound CX 1/8 -mixed / pending.    4 - Rectal Ulcer/Suspect Perforation - known DX rectal ulcer with periodic bleeding following prostate radiation 2018. Flex-Sig / BX 06/2018 of ulcer edges negative for malignancy. CT 08/14/18 with gas in peri-rectal / peri-prostatic space c/w likely microperforation.   Today " Phillip [facility]" is stable. Still with significant wound drainage, especially perineum. New wound CX's form this week still w/o actionable data.    Objective: Vital signs in last 24 hours: Temp:  [97.8 F (36.6 C)-98.9 F (37.2 C)] 98.2 F (36.8 C) (01/10 1349) Pulse Rate:  [78-98] 98 (01/10  1349) Resp:  [18-20] 18 (01/10 1349) BP: (120-143)/(66-75) 133/70 (01/10 1349) SpO2:  [97 %-98 %] 98 % (01/10 1349) Last BM Date: 08/20/18  Intake/Output from previous day: 01/09 0701 - 01/10 0700 In: 1450  Out: -  Intake/Output this shift: Total I/O In: -  Out: 1500 [Urine:1500]   EXAM: Undergoing dressing change.  Head: Normocephalic.  Eyes: Pupils are equal, round, and reactive to light.  Neck: Normal range of motion.  Cardiovascular: regular rate.  Respiratory:  Non-labored on room air GI: Soft. There is no abdominal tenderness. There is no rebound and no guarding.  Genitourinary:    Genitourinary Comments: Recent surgical drians with much improved / decreased purlulence and local swelling of penis, scrotum.  Perineum with high output very purlulent drainage, suspect some communication with colon per area.  Musculoskeletal: Normal range of motion.  Neurological: Phillip Blackwell is alert.  Skin: Skin is warm.  Psychiatric: Phillip Blackwell has a normal mood and affect. His behavior is normal.   Lab Results:  Recent Labs    08/19/18 0339 08/20/18 0522  WBC 12.0* 11.5*  HGB 7.4* 7.2*  HCT 23.0* 23.1*  PLT 496* 560*   BMET Recent Labs    08/19/18 0737 08/20/18 0522  NA 134* 135  K 4.4 3.9  CL 106 105  CO2 21* 21*  GLUCOSE 172* 111*  BUN 38* 33*  CREATININE 1.55* 1.43*  CALCIUM 8.0* 7.8*   PT/INR No results for input(s): LABPROT, INR in the last 72 hours. ABG No results for input(s): PHART, HCO3 in the last 72 hours.  Invalid input(s): PCO2, PO2  Studies/Results: No results found.  Anti-infectives: Anti-infectives (From admission, onward)   Start     Dose/Rate Route Frequency Ordered Stop   08/20/18 2200  ceFEPIme (MAXIPIME) 2 g in sodium chloride 0.9 % 100 mL IVPB  Status:  Discontinued     2 g 200 mL/hr over 30 Minutes Intravenous Every 12 hours 08/20/18 1315 08/20/18 1406   08/20/18 1500  Ampicillin-Sulbactam (UNASYN) 3 g in sodium chloride 0.9 % 100 mL IVPB     3  g 200 mL/hr over 30 Minutes Intravenous Every 6 hours 08/20/18 1420     08/17/18 1400  metroNIDAZOLE (FLAGYL) tablet 500 mg  Status:  Discontinued     500 mg Oral Every 8 hours 08/17/18 1303 08/20/18 1406   08/16/18 2000  ceFEPIme (MAXIPIME) 2 g in sodium chloride 0.9 % 100 mL IVPB  Status:  Discontinued     2 g 200 mL/hr over 30 Minutes Intravenous Every 24 hours 08/16/18 1328 08/20/18 1315   08/15/18 1030  vancomycin (VANCOCIN) 1,250 mg in sodium chloride 0.9 % 250 mL IVPB     1,250 mg 166.7 mL/hr over 90 Minutes Intravenous  Once 08/15/18 0942 08/15/18 1444   08/14/18 2000  ceFEPIme (MAXIPIME) 1 g in sodium chloride 0.9 % 100 mL IVPB  Status:  Discontinued     1 g 200 mL/hr over 30 Minutes Intravenous Every 24 hours 08/13/18 2052 08/16/18 1328   08/13/18 2052  vancomycin variable dose per unstable renal function (pharmacist dosing)  Status:  Discontinued      Does not apply See admin instructions 08/13/18 2052 08/16/18 1442   08/13/18 2045  ceFEPIme (MAXIPIME) 2 g in sodium chloride 0.9 % 100 mL IVPB     2 g 200 mL/hr over 30 Minutes Intravenous  Once 08/13/18 2038 08/13/18 2141   08/13/18 2045  metroNIDAZOLE (FLAGYL) IVPB 500 mg  Status:  Discontinued     500 mg 100 mL/hr over 60 Minutes Intravenous Every 8 hours 08/13/18 2038 08/17/18 1303   08/13/18 2045  vancomycin (VANCOCIN) IVPB 1000 mg/200 mL premix     1,000 mg 200 mL/hr over 60 Minutes Intravenous  Once 08/13/18 2038 08/13/18 2333      Assessment/Plan:  1 - Prostate Cancer, Likely Recurrent - PSA pattern most c/w active disease, but no signs of metastatic involvement. May warrant tissue sampling via transperineal approach in elective setting. No further diagnostics or intervention this admission.   2 - Acute Renal Failure - Improving, nearly resolved,  likely pre-renal due to sepsis. No hydro to warrant stenting / neph tubes. Rec continued foley for accurate UOP monitoring, appreciate nephrology comanagement.   3 -  Sepsis of GI/GU Origin with Prostatitis / Epididymitis / Colitis / Severe Penoscrotal Infection - Improving sytemically and locally on IV ABX and s/p local wound debridement.   Agree with current ABX pending further CX data from 1/8.   4 - Rectal Ulcer/Suspect Perforation - likely from progression of known radiation-induced rectal ulcer. Applaud NSG for management of difficult wound.   I strongly feel pt will need GI diversion with end colostomy to break his cycle of severe infecitons / readmissions. I have discussed this with him at length and Phillip [facility]well is amenable.  Tentatively rec combined gen surg / urology surgery Monday 1/13 with end colostomy and repeat wound debridement.    Phillip Blackwell 08/20/2018

## 2018-08-20 NOTE — Progress Notes (Signed)
PROGRESS NOTE        PATIENT DETAILS Name: Phillip Blackwell Age: 58 y.o. Sex: male Date of Birth: [birth year] Admit Date: 08/13/2018 Admitting Physician Shela Leff, MD PYK:DXIPJA, Pcp Not In  Brief Narrative: Patient is a 58 y.o. male with history of hypertension, BPH, OSA prostate cancer status post external beam radiation-presented with sepsis secondary to rectal ulcer with perforation with seeding of the GU tract causing epididymitis/prostatitis.  Further hospital course complicated by worsening penile/scrotal infection and AKI.  See below for further details  Subjective: Lying comfortably in bed-no chest pain or shortness of breath.  Assessment/Plan: Severe sepsis secondary to perforated rectal ulcer with prostatitis/epididymitis and penoscrotal abscess: Sepsis pathophysiology has resolved-underwent incision and drainage of penoscrotal abscess/soft tissue infection on 1/8.  Blood culture positive for eggerthella lenta.  Managed with cefepime and Flagyl so far-discussed case with ID MD-Dr. Michel Bickers over the phone-since patient has clinically improved, we can narrow antimicrobial therapy down to Unasyn.  He recommends a total of at least 3 weeks of antimicrobial therapy, and once all debridement has been completed-we can transition to Augmentin.  General surgery and urology following-plans are for repeat debridement of his scrotal area and diverting colostomy on Monday.  AKI: Hemodynamically mediated-likely ATN from sepsis.  Renal function slowly improving and creatinine has almost normalized.  Continue to follow electrolytes periodically.   Multifactorial anemia secondary to acute blood loss anemia and secondary to acute/critical illness: Hemoglobin slowly downtrending-did have hematochezia earlier (currently none) during this hospital stay.  Has been transfused 3 units of PRBC so far.  Suspect may require another unit of PRBC if hemoglobin continues to  downtrend-this is likely secondary to acute illness rather than blood loss.  Hyperbilirubinemia: Appears to be mostly direct hyperbilirubinemia-however no biliary dilatation seen on most recent CT scan.  Bilirubin has significantly improved and is almost normal.  Follow periodically.  .  Hypertension:  Controlled-continue metoprolol and prazosin.    Dyslipidemia: Continue statin  BPH: Continue Flomax  OSA: CPAP nightly  Moderate protein calorie malnutrition: Continue supplements  Acute debility/deconditioning: Likely secondary to acute illness-will likely require SNF for CIR on discharge.  DVT Prophylaxis: SCD's  Code Status: Full code  Family Communication: Spouse at bedside  Disposition Plan: Remain inpatient-requires several more days of hospitalization before consideration of discharge  Antimicrobial agents: Anti-infectives (From admission, onward)   Start     Dose/Rate Route Frequency Ordered Stop   08/20/18 2200  ceFEPIme (MAXIPIME) 2 g in sodium chloride 0.9 % 100 mL IVPB     2 g 200 mL/hr over 30 Minutes Intravenous Every 12 hours 08/20/18 1315     08/17/18 1400  metroNIDAZOLE (FLAGYL) tablet 500 mg     500 mg Oral Every 8 hours 08/17/18 1303     08/16/18 2000  ceFEPIme (MAXIPIME) 2 g in sodium chloride 0.9 % 100 mL IVPB  Status:  Discontinued     2 g 200 mL/hr over 30 Minutes Intravenous Every 24 hours 08/16/18 1328 08/20/18 1315   08/15/18 1030  vancomycin (VANCOCIN) 1,250 mg in sodium chloride 0.9 % 250 mL IVPB     1,250 mg 166.7 mL/hr over 90 Minutes Intravenous  Once 08/15/18 0942 08/15/18 1444   08/14/18 2000  ceFEPIme (MAXIPIME) 1 g in sodium chloride 0.9 % 100 mL IVPB  Status:  Discontinued     1  g 200 mL/hr over 30 Minutes Intravenous Every 24 hours 08/13/18 2052 08/16/18 1328   08/13/18 2052  vancomycin variable dose per unstable renal function (pharmacist dosing)  Status:  Discontinued      Does not apply See admin instructions 08/13/18 2052 08/16/18  1442   08/13/18 2045  ceFEPIme (MAXIPIME) 2 g in sodium chloride 0.9 % 100 mL IVPB     2 g 200 mL/hr over 30 Minutes Intravenous  Once 08/13/18 2038 08/13/18 2141   08/13/18 2045  metroNIDAZOLE (FLAGYL) IVPB 500 mg  Status:  Discontinued     500 mg 100 mL/hr over 60 Minutes Intravenous Every 8 hours 08/13/18 2038 08/17/18 1303   08/13/18 2045  vancomycin (VANCOCIN) IVPB 1000 mg/200 mL premix     1,000 mg 200 mL/hr over 60 Minutes Intravenous  Once 08/13/18 2038 08/13/18 2333      Procedures: None  CONSULTS:  general surgery and urology  Time spent: 25 minutes-Greater than 50% of this time was spent in counseling, explanation of diagnosis, planning of further management, and coordination of care.  MEDICATIONS: Scheduled Meds: . atorvastatin  40 mg Oral Q supper  . Chlorhexidine Gluconate Cloth  6 each Topical Daily  . cholecalciferol  2,000 Units Oral Daily  . feeding supplement (ENSURE ENLIVE)  237 mL Oral BID BM  . ferrous sulfate  325 mg Oral Q breakfast  . metoprolol tartrate  50 mg Oral BID  . metroNIDAZOLE  500 mg Oral Q8H  . multivitamin with minerals  1 tablet Oral Daily  . mupirocin ointment  1 application Nasal BID  . pantoprazole  40 mg Oral BID AC  . polycarbophil  625 mg Oral BID  . prazosin  2 mg Oral QHS   Continuous Infusions: . sodium chloride Stopped (08/17/18 0659)  . ceFEPime (MAXIPIME) IV    . lactated ringers 10 mL/hr at 08/18/18 2212   PRN Meds:.sodium chloride, acetaminophen **OR** acetaminophen, clonazePAM, ondansetron (ZOFRAN) IV, oxyCODONE, tamsulosin   PHYSICAL EXAM: Vital signs: Vitals:   08/19/18 1151 08/19/18 2334 08/20/18 0530 08/20/18 1349  BP:  120/66 (!) 143/75 133/70  Pulse:  78 87 98  Resp:  20  18  Temp: 97.9 F (36.6 C) 98.9 F (37.2 C) 97.8 F (36.6 C) 98.2 F (36.8 C)  TempSrc: Oral Oral Oral   SpO2:  97% 97% 98%  Weight:      Height:       Filed Weights   08/13/18 1922 08/13/18 2234  Weight: 86.2 kg 86.2 kg    Body mass index is 25.77 kg/m.   General appearance:Awake, alert, not in any distress.  Eyes:no scleral icterus. HEENT: Atraumatic and Normocephalic Neck: supple, no JVD. Resp:Good air entry bilaterally,no rales or rhonchi CVS: S1 S2 regular, no murmurs.  GI: Bowel sounds present, Non tender and not distended with no gaurding, rigidity or rebound. Extremities: B/L Lower Ext shows no edema, both legs are warm to touch Neurology:  Non focal Psychiatric: Normal judgment and insight. Normal mood. Musculoskeletal:No digital cyanosis Skin:No Rash, warm and dry Wounds:N/A  I have personally reviewed following labs and imaging studies  LABORATORY DATA: CBC: Recent Labs  Lab 08/13/18 2000  08/16/18 0327 08/17/18 0506 08/18/18 0359 08/19/18 0339 08/20/18 0522  WBC 41.7*   < > 18.5* 16.2* 14.9* 12.0* 11.5*  NEUTROABS 37.4*  --   --   --   --   --   --   HGB 8.4*   < > 8.3* 7.4* 8.1* 7.4* 7.2*  HCT 26.9*   < > 25.1* 23.3* 24.2* 23.0* 23.1*  MCV 93.4   < > 86.9 87.9 89.6 89.5 89.2  PLT 284   < > 321 406* 423* 496* 560*   < > = values in this interval not displayed.    Basic Metabolic Panel: Recent Labs  Lab 08/16/18 0327 08/17/18 0506 08/18/18 0359 08/19/18 0737 08/20/18 0522  NA 135 135 135 134* 135  K 3.2* 3.8 4.5 4.4 3.9  CL 101 104 104 106 105  CO2 21* 20* 21* 21* 21*  GLUCOSE 94 96 86 172* 111*  BUN 76* 56* 46* 38* 33*  CREATININE 3.15* 2.41* 2.01* 1.55* 1.43*  CALCIUM 7.9* 8.0* 8.0* 8.0* 7.8*    GFR: Estimated Creatinine Clearance: 61 mL/min (A) (by C-G formula based on SCr of 1.43 mg/dL (H)).  Liver Function Tests: Recent Labs  Lab 08/14/18 0438 08/15/18 0238 08/16/18 0327 08/17/18 0506 08/19/18 0339  AST 33 35 47* 41 42*  ALT 28 26 33 33 31  ALKPHOS 87 128* 88 83 66  BILITOT 5.0* 3.7* 2.9* 2.3* 1.6*  PROT 6.1* 5.7* 6.1* 6.3* 6.2*  ALBUMIN 1.5* 1.2* 1.4* 1.4* 1.2*   Recent Labs  Lab 08/13/18 2047  LIPASE 33   No results for input(s):  AMMONIA in the last 168 hours.  Coagulation Profile: Recent Labs  Lab 08/13/18 2000  INR 1.18    Cardiac Enzymes: No results for input(s): CKTOTAL, CKMB, CKMBINDEX, TROPONINI in the last 168 hours.  BNP (last 3 results) No results for input(s): PROBNP in the last 8760 hours.  HbA1C: No results for input(s): HGBA1C in the last 72 hours.  CBG: Recent Labs  Lab 08/13/18 2308 08/18/18 2229  GLUCAP 156* 100*    Lipid Profile: No results for input(s): CHOL, HDL, LDLCALC, TRIG, CHOLHDL, LDLDIRECT in the last 72 hours.  Thyroid Function Tests: No results for input(s): TSH, T4TOTAL, FREET4, T3FREE, THYROIDAB in the last 72 hours.  Anemia Panel: No results for input(s): VITAMINB12, FOLATE, FERRITIN, TIBC, IRON, RETICCTPCT in the last 72 hours.  Urine analysis:    Component Value Date/Time   COLORURINE AMBER (A) 08/13/2018 2010   APPEARANCEUR CLOUDY (A) 08/13/2018 2010   LABSPEC 1.014 08/13/2018 2010   PHURINE 5.0 08/13/2018 2010   GLUCOSEU NEGATIVE 08/13/2018 2010   HGBUR NEGATIVE 08/13/2018 2010   [place] NEGATIVE 08/13/2018 2010   [place] NEGATIVE 08/13/2018 2010   PROTEINUR 30 (A) 08/13/2018 2010   NITRITE NEGATIVE 08/13/2018 2010   LEUKOCYTESUR NEGATIVE 08/13/2018 2010    Sepsis Labs: Lactic Acid, Venous    Component Value Date/Time   LATICACIDVEN 1.95 (H) 08/13/2018 2313    MICROBIOLOGY: Recent Results (from the past 240 hour(s))  Culture, blood (Routine x 2)     Status: Abnormal   Collection Time: 08/13/18  7:20 PM  Result Value Ref Range Status   Specimen Description BLOOD RIGHT ARM  Final   Special Requests   Final    BOTTLES DRAWN AEROBIC AND ANAEROBIC Blood Culture adequate volume   Culture  Setup Time   Final    GRAM POSITIVE RODS ANAEROBIC BOTTLE ONLY CRITICAL RESULT CALLED TO, READ BACK BY AND VERIFIED WITH: J. [place], [place] ON 08/16/18 BY C. JESSUP, MLT.    Culture (A)  Final    EGGERTHELLA LENTA Standardized susceptibility  testing for this organism is not available. Performed at [facility], [place] [address] [place], [place] [address]    Report Status 08/19/2018 FINAL  Final  Culture, blood (Routine x 2)     Status: None   Collection Time: 08/13/18  7:40 PM  Result Value Ref Range Status   Specimen Description BLOOD LEFT ARM  Final   Special Requests   Final    BOTTLES DRAWN AEROBIC ONLY Blood Culture results may not be optimal due to an excessive volume of blood received in culture bottles   Culture   Final    NO GROWTH 5 DAYS Performed at [facility], [place] [address], [place], [place] [address]    Report Status 08/18/2018 FINAL  Final  Urine culture     Status: None   Collection Time: 08/13/18  8:29 PM  Result Value Ref Range Status   Specimen Description URINE, RANDOM  Final   Special Requests NONE  Final   Culture   Final    NO GROWTH Performed at [facility], [place] [address], [place], [place] [address]    Report Status 08/15/2018 FINAL  Final  MRSA PCR Screening     Status: None   Collection Time: 08/15/18 12:09 AM  Result Value Ref Range Status   MRSA by PCR NEGATIVE NEGATIVE Final    Comment:        The GeneXpert MRSA Assay (FDA approved for NASAL specimens only), is one component of a comprehensive MRSA colonization surveillance program. It is not intended to diagnose MRSA infection nor to guide or monitor treatment for MRSA infections. Performed at [facility], [place] [address] [place], [place] [address]   Surgical pcr screen     Status: Abnormal   Collection Time: 08/18/18 10:06 AM  Result Value Ref Range Status   MRSA, PCR NEGATIVE NEGATIVE Final   Staphylococcus aureus POSITIVE (A) NEGATIVE Final    Comment: (NOTE) The Xpert SA Assay (FDA approved for NASAL specimens in patients 25 years of age and older), is one component of a comprehensive surveillance program. It is not intended to diagnose infection nor to guide or monitor treatment. Performed  at [facility], [place] [address], [place], [place] [address]   Aerobic/Anaerobic Culture (surgical/deep wound)     Status: Abnormal (Preliminary result)   Collection Time: 08/18/18  6:14 PM  Result Value Ref Range Status   Specimen Description TISSUE  Final   Special Requests PENOSCROTAL  Final   Gram Stain   Final    RARE WBC PRESENT, PREDOMINANTLY PMN ABUNDANT GRAM POSITIVE COCCI ABUNDANT GRAM NEGATIVE RODS ABUNDANT GRAM POSITIVE RODS Performed at [facility], [address] [place], [place] [address]    Culture (A)  Final    MULTIPLE ORGANISMS PRESENT, NONE PREDOMINANT NO ANAEROBES ISOLATED; CULTURE IN PROGRESS FOR 5 DAYS    Report Status PENDING  Incomplete  Aerobic/Anaerobic Culture (surgical/deep wound)     Status: None (Preliminary result)   Collection Time: 08/18/18  6:16 PM  Result Value Ref Range Status   Specimen Description ABSCESS  Final   Special Requests PENOSCROTAL SWABS  Final   Gram Stain   Final    NO WBC SEEN ABUNDANT GRAM POSITIVE COCCI MODERATE GRAM NEGATIVE RODS MODERATE GRAM POSITIVE RODS Performed at [facility], [address], [place], [place] [address]    Culture   Final    CULTURE REINCUBATED FOR BETTER GROWTH NO ANAEROBES ISOLATED; CULTURE IN PROGRESS FOR 5 DAYS    Report Status PENDING  Incomplete    RADIOLOGY STUDIES/RESULTS: Ct Abdomen Pelvis Wo Contrast  Result Date: 08/13/2018  CLINICAL DATA:  Groin swelling EXAM: CT ABDOMEN AND PELVIS WITHOUT CONTRAST TECHNIQUE: Multidetector CT imaging of the abdomen and pelvis was performed following the standard protocol without IV contrast. COMPARISON:  MRI 07/10/2018 FINDINGS: Lower chest: Lung bases demonstrate no acute consolidation or effusion. The heart size is normal. Small hiatal hernia. Hepatobiliary: No focal liver abnormality is seen. No gallstones, gallbladder wall thickening, or biliary dilatation. Pancreas: Unremarkable. No pancreatic ductal dilatation or surrounding  inflammatory changes. Spleen: Normal in size without focal abnormality. Adrenals/Urinary Tract: Adrenal glands are within normal limits. No hydronephrosis. Thick-walled urinary bladder Stomach/Bowel: Stomach is nonenlarged. No dilated small bowel. Extensive gas collection Between the rectum and prostate. Vascular/Lymphatic: Mild aortic atherosclerosis. No aneurysm. No significantly enlarged lymph nodes. Reproductive: Metallic densities along the posterior aspect of the prostate. Large gas collections around the prostate gland. Moderate edema of the scrotum and around the penis. Other: No free air. Fat in the left inguinal canal. Edema within the pubic soft tissues. Gas and fluid collection extending from the rectal area and along both sides of the prostate gland. Musculoskeletal: No acute or suspicious abnormality. IMPRESSION: 1. Moderate edema and mild inflammatory changes involving the scrotum and soft tissues about the penis, but without soft tissue emphysema. Mild edema within the pubic soft tissues. 2. Abnormal gas and fluid collection between the rectum and prostate gland and extending along the right and left aspects of the prostate gland, felt to correspond to previously demonstrated rectal ulcer and sinus tracks. 3. Thick-walled appearance of the urinary bladder, possible cystitis Electronically Signed   By: Donavan Foil M.D.   On: 08/13/2018 23:19   Dg Chest 2 View  Result Date: 08/13/2018 CLINICAL DATA:  Acute onset of genital swelling. Fever. EXAM: CHEST - 2 VIEW COMPARISON:  Chest radiograph performed 07/07/2018 FINDINGS: The lungs are well-aerated. Minimal left basilar atelectasis is noted. There is no evidence of pleural effusion or pneumothorax. The heart is normal in size; the mediastinal contour is within normal limits. No acute osseous abnormalities are seen. IMPRESSION: Minimal left basilar atelectasis noted. Lungs otherwise clear. Electronically Signed   By: Garald Balding M.D.   On:  08/13/2018 22:16   Ct Pelvis Wo Contrast  Result Date: 08/17/2018 CLINICAL DATA:  Proctitis with micro perforation, now with abscess at penile shaft, worsening pelvic swelling EXAM: CT PELVIS WITHOUT CONTRAST TECHNIQUE: Multidetector CT imaging of the pelvis was performed following the standard protocol without intravenous contrast. COMPARISON:  CT abdomen/pelvis dated 09/09/2018. MR pelvis dated 07/10/2018. FINDINGS: Motion degraded images. Urinary Tract: Thick-walled bladder with indwelling Foley catheter and nondependent gas. Bowel: Visualized bowel is poorly evaluated but grossly unremarkable. Vascular/Lymphatic: Mild atherosclerotic calcifications of the bilateral iliac vessels. No suspicious pelvic lymphadenopathy. Reproductive: Gas within the bilateral seminal vesicles. Additional gas along the retro prostatic soft tissues (series 3/image 37) and extending along the right pelvic sidewall (series 3/image 31), unchanged. Fiducial radiation markers along the posterior prostate. One of these markers is now displaced posterior to the rectum (series 3/image 29), new from recent CT. Other: Subcutaneous stranding along the anterior lower pelvic wall, left greater than right (series 3/image 32), progressive. Additional fluid/stranding along the proximal penile shaft (series 3/image 54) with diffuse scrotal edema (series 3/image 68). This appearance is compatible with cellulitis. No drainable fluid collection/abscess on CT. Musculoskeletal: Visualized osseous structures are within normal limits. IMPRESSION: Gas along the bilateral seminal vesicles and retro prostatic soft tissues, likely related to known rectal microperforation, unchanged. Associated cellulitis involving the lower pelvic wall, penile  shaft, and scrotum. No drainable fluid collection/abscess on CT. Displaced fiducial radiation marker now posterior to the rectum, new from recent CT. Electronically Signed   By: Julian Hy M.D.   On: 08/17/2018  11:34   US Renal  Result Date: 08/14/2018 CLINICAL DATA:  Acute renal failure. EXAM: RENAL / URINARY TRACT ULTRASOUND COMPLETE COMPARISON:  Noncontrast CT yesterday. FINDINGS: Right Kidney: Renal measurements: 12.6 x 5.2 x 6.1 cm = volume: 207 mL . Echogenicity within normal limits. No mass or hydronephrosis visualized. Left Kidney: Renal measurements: 12.8 x 5.1 x 5.7 cm = volume: 195 mL. Echogenicity within normal limits. No mass or hydronephrosis visualized. Bladder: Bladder wall thickening of 9 mm, as seen on CT. IMPRESSION: 1. Bladder wall thickening, similar to CT yesterday. 2. Unremarkable sonographic appearance of both kidneys. No hydronephrosis. Electronically Signed   By: Keith Rake M.D.   On: 08/14/2018 03:48   US Scrotum W/doppler  Result Date: 08/13/2018 CLINICAL DATA:  Initial evaluation for acute testicular pain, swelling. EXAM: SCROTAL ULTRASOUND DOPPLER ULTRASOUND OF THE TESTICLES TECHNIQUE: Complete ultrasound examination of the testicles, epididymis, and other scrotal structures was performed. Color and spectral Doppler ultrasound were also utilized to evaluate blood flow to the testicles. COMPARISON:  None. FINDINGS: Right testicle Measurements: 3.5 x 2.5 x 1.9 cm. No mass lesion. Testicular microlithiasis noted. Left testicle Measurements: 3.9 x 2.2 x 2.3 cm. No mass lesion. Testicular microlithiasis noted. Right epididymis:  Normal in size and appearance. Left epididymis: Diffusely increased vascularity seen within the left epididymis, suggesting acute epididymitis. Hydrocele:  Bilateral hydroceles noted. Varicocele:  Bilateral varicoceles noted. Pulsed Doppler interrogation of both testes demonstrates normal low resistance arterial and venous waveforms bilaterally. Incidental note made of a probable small fat containing right inguinal hernia. IMPRESSION: 1. Increased vascularity within the left epididymis, suggesting acute epididymitis. No evidence for testicular torsion. 2. Small  moderate bilateral hydroceles, likely reactive. 3. Small bilateral varicoceles. 4. Probable small fat containing right inguinal hernia. 5. Testicular microlithiasis. Current literature suggests that testicular microlithiasis is not a significant independent risk factor for development of testicular carcinoma, and that follow up imaging is not warranted in the absence of other risk factors. Monthly testicular self-examination and annual physical exams are considered appropriate surveillance. If patient has other risk factors for testicular carcinoma, then referral to Urology should be considered. (Reference: DeCastro, et al.: A 5-Year Follow up Study of Asymptomatic Men with Testicular Microlithiasis. J Urol 2008; [id number].) Electronically Signed   By: Jeannine Boga M.D.   On: 08/13/2018 22:48   US Abdomen Limited Ruq  Result Date: 08/14/2018 CLINICAL DATA:  Initial evaluation for elevated AST with leukocytosis. EXAM: ULTRASOUND ABDOMEN LIMITED RIGHT UPPER QUADRANT COMPARISON:  None. FINDINGS: Gallbladder: No gallstones or wall thickening visualized. No sonographic Murphy sign noted by sonographer. Common bile duct: Diameter: 4.6 mm Liver: No focal lesion identified. Within normal limits in parenchymal echogenicity. Portal vein is patent on color Doppler imaging with normal direction of blood flow towards the liver. IMPRESSION: Normal right upper quadrant ultrasound. No cholelithiasis, evidence for acute cholecystitis, or biliary dilatation. Electronically Signed   By: Jeannine Boga M.D.   On: 08/14/2018 03:43     LOS: 6 days   Oren Binet, MD  [facility]  If 7PM-7AM, please contact night-coverage  Please page via [email or web address] [email or web address]-click on MD name and type text message  08/20/2018, 2:01 PM

## 2018-08-21 LAB — BASIC METABOLIC PANEL
Anion gap: 8 (ref 5–15)
BUN: 24 mg/dL — ABNORMAL HIGH (ref 6–20)
CO2: 21 mmol/L — ABNORMAL LOW (ref 22–32)
Calcium: 7.9 mg/dL — ABNORMAL LOW (ref 8.9–10.3)
Chloride: 105 mmol/L (ref 98–111)
Creatinine, Ser: 1.49 mg/dL — ABNORMAL HIGH (ref 0.61–1.24)
GFR calc Af Amer: 59 mL/min — ABNORMAL LOW (ref >60)
GFR calc non Af Amer: 51 mL/min — ABNORMAL LOW (ref >60)
Glucose, Bld: 90 mg/dL (ref 70–99)
POTASSIUM: 4 mmol/L (ref 3.5–5.1)
Sodium: 134 mmol/L — ABNORMAL LOW (ref 135–145)

## 2018-08-21 LAB — CBC
HCT: 23.4 % — ABNORMAL LOW (ref 39.0–52.0)
HEMOGLOBIN: 7.3 g/dL — AB (ref 13.0–17.0)
MCH: 28 pg (ref 26.0–34.0)
MCHC: 31.2 g/dL (ref 30.0–36.0)
MCV: 89.7 fL (ref 80.0–100.0)
Platelets: 629 10*3/uL — ABNORMAL HIGH (ref 150–400)
RBC: 2.61 MIL/uL — ABNORMAL LOW (ref 4.22–5.81)
RDW: 15.8 % — ABNORMAL HIGH (ref 11.5–15.5)
WBC: 10.7 10*3/uL — ABNORMAL HIGH (ref 4.0–10.5)
nRBC: 0 % (ref 0.0–0.2)

## 2018-08-21 LAB — GLUCOSE, CAPILLARY: GLUCOSE-CAPILLARY: 118 mg/dL — AB (ref 70–99)

## 2018-08-21 MED ORDER — DEXTROSE 50 % IV SOLN
INTRAVENOUS | Status: AC
  Filled 2018-08-21: qty 50

## 2018-08-21 NOTE — Progress Notes (Signed)
3 Days Post-Op Subjective: Patient reports no complaints.   Objective: Vital signs in last 24 hours: Temp:  [98.2 F (36.8 C)-101.5 F (38.6 C)] 99.4 F (37.4 C) (01/11 0443) Pulse Rate:  [98-105] 103 (01/11 0443) Resp:  [16-18] 16 (01/11 0443) BP: (133-156)/(70-80) 151/75 (01/11 0443) SpO2:  [97 %-98 %] 97 % (01/11 0443)  Intake/Output from previous day: 01/10 0701 - 01/11 0700 In: 1490.5 [P.O.:840; I.V.:450.5; IV Piggyback:200] Out: 1610 [Urine:3450] Intake/Output this shift: No intake/output data recorded.  Physical Exam:  NAD Nurses just changed dressing - drains are draining well.  Penile and scrotal incisions are granulating at the edges -- good drainage with penrose, dont feel any fluctuance, mild scrotal edema but scrotum looks healthy.   Lab Results: Recent Labs    08/19/18 0339 08/20/18 0522 08/21/18 0318  HGB 7.4* 7.2* 7.3*  HCT 23.0* 23.1* 23.4*   BMET Recent Labs    08/20/18 0522 08/21/18 0318  NA 135 134*  K 3.9 4.0  CL 105 105  CO2 21* 21*  GLUCOSE 111* 90  BUN 33* 24*  CREATININE 1.43* 1.49*  CALCIUM 7.8* 7.9*   No results for input(s): LABPT, INR in the last 72 hours. No results for input(s): LABURIN in the last 72 hours. Results for orders placed or performed during the hospital encounter of 08/13/18  Culture, blood (Routine x 2)     Status: Abnormal   Collection Time: 08/13/18  7:20 PM  Result Value Ref Range Status   Specimen Description BLOOD RIGHT ARM  Final   Special Requests   Final    BOTTLES DRAWN AEROBIC AND ANAEROBIC Blood Culture adequate volume   Culture  Setup Time   Final    GRAM POSITIVE RODS ANAEROBIC BOTTLE ONLY CRITICAL RESULT CALLED TO, READ BACK BY AND VERIFIED WITH: J. Spencer, [place] ON 08/16/18 BY C. JESSUP, MLT.    Culture (A)  Final    EGGERTHELLA LENTA Standardized susceptibility testing for this organism is not available. Performed at [facility], [place] [address] [place], [place] [address]    Report Status 08/19/2018 FINAL  Final  Culture, blood (Routine x 2)     Status: None   Collection Time: 08/13/18  7:40 PM  Result Value Ref Range Status   Specimen Description BLOOD LEFT ARM  Final   Special Requests   Final    BOTTLES DRAWN AEROBIC ONLY Blood Culture results may not be optimal due to an excessive volume of blood received in culture bottles   Culture   Final    NO GROWTH 5 DAYS Performed at [facility], [place] [address] [place], [place] [address]    Report Status 08/18/2018 FINAL  Final  Urine culture     Status: None   Collection Time: 08/13/18  8:29 PM  Result Value Ref Range Status   Specimen Description URINE, RANDOM  Final   Special Requests NONE  Final   Culture   Final    NO GROWTH Performed at [facility], [place] [address] [place], [place] [address]    Report Status 08/15/2018 FINAL  Final  MRSA PCR Screening     Status: None   Collection Time: 08/15/18 12:09 AM  Result Value Ref Range Status   MRSA by PCR NEGATIVE NEGATIVE Final    Comment:        The GeneXpert MRSA Assay (FDA approved for NASAL specimens only), is one component of a comprehensive MRSA colonization surveillance program.  It is not intended to diagnose MRSA infection nor to guide or monitor treatment for MRSA infections. Performed at [facility], [place] [address], [place], [place] [address]   Surgical pcr screen     Status: Abnormal   Collection Time: 08/18/18 10:06 AM  Result Value Ref Range Status   MRSA, PCR NEGATIVE NEGATIVE Final   Staphylococcus aureus POSITIVE (A) NEGATIVE Final    Comment: (NOTE) The Xpert SA Assay (FDA approved for NASAL specimens in patients 60 years of age and older), is one component of a comprehensive surveillance program. It is not intended to diagnose infection nor to guide or monitor treatment. Performed at [facility], [place] [address] [place], [place] [address]   Aerobic/Anaerobic Culture (surgical/deep wound)      Status: Abnormal (Preliminary result)   Collection Time: 08/18/18  6:14 PM  Result Value Ref Range Status   Specimen Description TISSUE  Final   Special Requests PENOSCROTAL  Final   Gram Stain   Final    RARE WBC PRESENT, PREDOMINANTLY PMN ABUNDANT GRAM POSITIVE COCCI ABUNDANT GRAM NEGATIVE RODS ABUNDANT GRAM POSITIVE RODS Performed at [facility], [address], [place], [place] [address]    Culture (A)  Final    MULTIPLE ORGANISMS PRESENT, NONE PREDOMINANT NO ANAEROBES ISOLATED; CULTURE IN PROGRESS FOR 5 DAYS    Report Status PENDING  Incomplete  Aerobic/Anaerobic Culture (surgical/deep wound)     Status: None (Preliminary result)   Collection Time: 08/18/18  6:16 PM  Result Value Ref Range Status   Specimen Description ABSCESS  Final   Special Requests PENOSCROTAL SWABS  Final   Gram Stain   Final    NO WBC SEEN ABUNDANT GRAM POSITIVE COCCI MODERATE GRAM NEGATIVE RODS MODERATE GRAM POSITIVE RODS Performed at [facility], [address] [place], [place] [address]    Culture   Final    NORMAL SKIN FLORA NO ANAEROBES ISOLATED; CULTURE IN PROGRESS FOR 5 DAYS    Report Status PENDING  Incomplete    Studies/Results: No results found.  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 WBC down to 10.7, Cr improved. No fevers.   Dr. Tresa Moore considering another washout but thought it would make sense to coordinate with Gen Surg if diversion / colostomy is planned which may be Monday. I'll let Dr. Tresa Moore know so that he or on call GU could come over to washout the wound/change the drains.   I changed dressing change to TID    LOS: 7 days   Phillip Blackwell 08/21/2018, 12:50 PM

## 2018-08-21 NOTE — Progress Notes (Signed)
RT NOTE: ? ?Pt refuses CPAP.  ?

## 2018-08-21 NOTE — Progress Notes (Signed)
PROGRESS NOTE        PATIENT DETAILS Name: Phillip Blackwell Age: 58 y.o. Sex: male Date of Birth: 14-May-1961 Admit Date: 08/13/2018 Admitting Physician Shela Leff, MD [facility], Pcp Not In  Brief Narrative: Patient is a 58 y.o. male with history of hypertension, BPH, OSA prostate cancer status post external beam radiation-presented with sepsis secondary to rectal ulcer with perforation with seeding of the GU tract causing epididymitis/prostatitis.  Further hospital course complicated by worsening penile/scrotal infection and AKI.  See below for further details  Subjective: Febrile last night-but no shortness of breath, cough, diarrhea or abdominal pain.  Lying comfortably in bed.  Assessment/Plan: Severe sepsis secondary to perforated rectal ulcer with prostatitis/epididymitis and penoscrotal abscess: Sepsis pathophysiology has resolved-however febrile last night.  Underwent I&D of penoscrotal abscess on 1/8.  Blood culture positive for eggerthella lenta.  Initially on cefepime and Flagyl-after discussion with infectious disease-Dr. Michel Bickers on 1/10-transition to Unasyn.  Plan is for total 3 weeks of IV antimicrobial therapy, once patient is further stable-we will transition to Augmentin.  Will monitor fever trend-urology/general surgery following-for repeat debridement of his scrotal area and diverting colostomy on Monday.    AKI: Hemodynamically mediated-likely ATN from sepsis.  Renal function has improved-but has plateaued to 1.4 range.  Continue to monitor electrolytes periodically. .   Multifactorial anemia secondary to acute blood loss anemia and secondary to acute/critical illness: He hemoglobin continues to slowly downtrend-during the early part of his hospital course-patient did have hematochezia, requiring 3 units of PRBC transfusion.  Supportive care-follow CBC.  If hemoglobin is less than 7 will transfuse 1 more unit.   Hyperbilirubinemia:  Appears to be mostly direct hyperbilirubinemia-no biliary dilatation seen on CT scan.  Bilirubin has normalized-not sure if this was all secondary to sepsis.  Follow periodically.    Hypertension: Moderately controlled-continue metoprolol and prazosin.  Dyslipidemia: Continue statin  BPH: Continue Flomax  OSA: CPAP nightly  Moderate protein calorie malnutrition: Continue supplements  Acute debility/deconditioning: Likely secondary to acute illness-will likely require SNF for CIR on discharge.  DVT Prophylaxis: SCD's  Code Status: Full code  Family Communication: Spouse at bedside  Disposition Plan: Remain inpatient-requires several more days of hospitalization before consideration of discharge  Antimicrobial agents: Anti-infectives (From admission, onward)   Start     Dose/Rate Route Frequency Ordered Stop   08/20/18 2200  ceFEPIme (MAXIPIME) 2 g in sodium chloride 0.9 % 100 mL IVPB  Status:  Discontinued     2 g 200 mL/hr over 30 Minutes Intravenous Every 12 hours 08/20/18 1315 08/20/18 1406   08/20/18 1500  Ampicillin-Sulbactam (UNASYN) 3 g in sodium chloride 0.9 % 100 mL IVPB     3 g 200 mL/hr over 30 Minutes Intravenous Every 6 hours 08/20/18 1420     08/17/18 1400  metroNIDAZOLE (FLAGYL) tablet 500 mg  Status:  Discontinued     500 mg Oral Every 8 hours 08/17/18 1303 08/20/18 1406   08/16/18 2000  ceFEPIme (MAXIPIME) 2 g in sodium chloride 0.9 % 100 mL IVPB  Status:  Discontinued     2 g 200 mL/hr over 30 Minutes Intravenous Every 24 hours 08/16/18 1328 08/20/18 1315   08/15/18 1030  vancomycin (VANCOCIN) 1,250 mg in sodium chloride 0.9 % 250 mL IVPB     1,250 mg 166.7 mL/hr over 90 Minutes Intravenous  Once 08/15/18  1017 08/15/18 1444   08/14/18 2000  ceFEPIme (MAXIPIME) 1 g in sodium chloride 0.9 % 100 mL IVPB  Status:  Discontinued     1 g 200 mL/hr over 30 Minutes Intravenous Every 24 hours 08/13/18 2052 08/16/18 1328   08/13/18 2052  vancomycin variable dose  per unstable renal function (pharmacist dosing)  Status:  Discontinued      Does not apply See admin instructions 08/13/18 2052 08/16/18 1442   08/13/18 2045  ceFEPIme (MAXIPIME) 2 g in sodium chloride 0.9 % 100 mL IVPB     2 g 200 mL/hr over 30 Minutes Intravenous  Once 08/13/18 2038 08/13/18 2141   08/13/18 2045  metroNIDAZOLE (FLAGYL) IVPB 500 mg  Status:  Discontinued     500 mg 100 mL/hr over 60 Minutes Intravenous Every 8 hours 08/13/18 2038 08/17/18 1303   08/13/18 2045  vancomycin (VANCOCIN) IVPB 1000 mg/200 mL premix     1,000 mg 200 mL/hr over 60 Minutes Intravenous  Once 08/13/18 2038 08/13/18 2333      Procedures: None  CONSULTS:  general surgery and urology  Time spent: 25 minutes-Greater than 50% of this time was spent in counseling, explanation of diagnosis, planning of further management, and coordination of care.  MEDICATIONS: Scheduled Meds: . dextrose      . atorvastatin  40 mg Oral Q supper  . Chlorhexidine Gluconate Cloth  6 each Topical Daily  . cholecalciferol  2,000 Units Oral Daily  . feeding supplement (ENSURE ENLIVE)  237 mL Oral BID BM  . ferrous sulfate  325 mg Oral Q breakfast  . metoprolol tartrate  50 mg Oral BID  . multivitamin with minerals  1 tablet Oral Daily  . mupirocin ointment  1 application Nasal BID  . mupirocin ointment  1 application Topical BID  . pantoprazole  40 mg Oral BID AC  . polycarbophil  625 mg Oral BID  . prazosin  2 mg Oral QHS   Continuous Infusions: . sodium chloride Stopped (08/17/18 0659)  . ampicillin-sulbactam (UNASYN) IV 3 g (08/21/18 1019)  . lactated ringers 10 mL/hr at 08/18/18 2212   PRN Meds:.sodium chloride, acetaminophen **OR** acetaminophen, clonazePAM, ondansetron (ZOFRAN) IV, oxyCODONE, tamsulosin   PHYSICAL EXAM: Vital signs: Vitals:   08/20/18 0530 08/20/18 1349 08/20/18 2200 08/21/18 0443  BP: (!) 143/75 133/70 (!) 156/80 (!) 151/75  Pulse: 87 98 (!) 105 (!) 103  Resp:  18 16 16   Temp:  97.8 F (36.6 C) 98.2 F (36.8 C) (!) 101.5 F (38.6 C) 99.4 F (37.4 C)  TempSrc: Oral  Oral Oral  SpO2: 97% 98% 98% 97%  Weight:      Height:       Filed Weights   08/13/18 1922 08/13/18 2234  Weight: 86.2 kg 86.2 kg   Body mass index is 25.77 kg/m.   General appearance:Awake, alert, not in any distress.  Eyes:no scleral icterus. HEENT: Atraumatic and Normocephalic Neck: supple, no JVD. Resp:Good air entry bilaterally,no rales or rhonchi CVS: S1 S2 regular, no murmurs.  GI: Bowel sounds present, Non tender and not distended with no gaurding, rigidity or rebound. Extremities: B/L Lower Ext shows no edema, both legs are warm to touch Neurology:  Non focal Psychiatric: Normal judgment and insight. Normal mood. Musculoskeletal:No digital cyanosis Skin:No Rash, warm and dry Wounds:N/A  I have personally reviewed following labs and imaging studies  LABORATORY DATA: CBC: Recent Labs  Lab 08/17/18 0506 08/18/18 0359 08/19/18 0339 08/20/18 0522 08/21/18 0318  WBC 16.2* 14.9*  12.0* 11.5* 10.7*  HGB 7.4* 8.1* 7.4* 7.2* 7.3*  HCT 23.3* 24.2* 23.0* 23.1* 23.4*  MCV 87.9 89.6 89.5 89.2 89.7  PLT 406* 423* 496* 560* 629*    Basic Metabolic Panel: Recent Labs  Lab 08/17/18 0506 08/18/18 0359 08/19/18 0737 08/20/18 0522 08/21/18 0318  NA 135 135 134* 135 134*  K 3.8 4.5 4.4 3.9 4.0  CL 104 104 106 105 105  CO2 20* 21* 21* 21* 21*  GLUCOSE 96 86 172* 111* 90  BUN 56* 46* 38* 33* 24*  CREATININE 2.41* 2.01* 1.55* 1.43* 1.49*  CALCIUM 8.0* 8.0* 8.0* 7.8* 7.9*    GFR: Estimated Creatinine Clearance: 58.6 mL/min (A) (by C-G formula based on SCr of 1.49 mg/dL (H)).  Liver Function Tests: Recent Labs  Lab 08/15/18 0238 08/16/18 0327 08/17/18 0506 08/19/18 0339  AST 35 47* 41 42*  ALT 26 33 33 31  ALKPHOS 128* 88 83 66  BILITOT 3.7* 2.9* 2.3* 1.6*  PROT 5.7* 6.1* 6.3* 6.2*  ALBUMIN 1.2* 1.4* 1.4* 1.2*   No results for input(s): LIPASE, AMYLASE in the last  168 hours. No results for input(s): AMMONIA in the last 168 hours.  Coagulation Profile: No results for input(s): INR, PROTIME in the last 168 hours.  Cardiac Enzymes: No results for input(s): CKTOTAL, CKMB, CKMBINDEX, TROPONINI in the last 168 hours.  BNP (last 3 results) No results for input(s): PROBNP in the last 8760 hours.  HbA1C: No results for input(s): HGBA1C in the last 72 hours.  CBG: Recent Labs  Lab 08/18/18 2229 08/21/18 0958  GLUCAP 100* 118*    Lipid Profile: No results for input(s): CHOL, HDL, LDLCALC, TRIG, CHOLHDL, LDLDIRECT in the last 72 hours.  Thyroid Function Tests: No results for input(s): TSH, T4TOTAL, FREET4, T3FREE, THYROIDAB in the last 72 hours.  Anemia Panel: No results for input(s): VITAMINB12, FOLATE, FERRITIN, TIBC, IRON, RETICCTPCT in the last 72 hours.  Urine analysis:    Component Value Date/Time   COLORURINE AMBER (A) 08/13/2018 2010   APPEARANCEUR CLOUDY (A) 08/13/2018 2010   LABSPEC 1.014 08/13/2018 2010   PHURINE 5.0 08/13/2018 2010   GLUCOSEU NEGATIVE 08/13/2018 2010   HGBUR NEGATIVE 08/13/2018 2010   [place] NEGATIVE 08/13/2018 2010   [place] NEGATIVE 08/13/2018 2010   PROTEINUR 30 (A) 08/13/2018 2010   NITRITE NEGATIVE 08/13/2018 2010   LEUKOCYTESUR NEGATIVE 08/13/2018 2010    Sepsis Labs: Lactic Acid, Venous    Component Value Date/Time   LATICACIDVEN 1.95 (H) 08/13/2018 2313    MICROBIOLOGY: Recent Results (from the past 240 hour(s))  Culture, blood (Routine x 2)     Status: Abnormal   Collection Time: 08/13/18  7:20 PM  Result Value Ref Range Status   Specimen Description BLOOD RIGHT ARM  Final   Special Requests   Final    BOTTLES DRAWN AEROBIC AND ANAEROBIC Blood Culture adequate volume   Culture  Setup Time   Final    GRAM POSITIVE RODS ANAEROBIC BOTTLE ONLY CRITICAL RESULT CALLED TO, READ BACK BY AND VERIFIED WITH: J. [place], [place] ON 08/16/18 BY C. JESSUP, MLT.    Culture (A)  Final     EGGERTHELLA LENTA Standardized susceptibility testing for this organism is not available. Performed at [facility], [place] [address] [place], [place] [address]    Report Status 08/19/2018 FINAL  Final  Culture, blood (Routine x 2)     Status: None   Collection Time: 08/13/18  7:40 PM  Result Value  Ref Range Status   Specimen Description BLOOD LEFT ARM  Final   Special Requests   Final    BOTTLES DRAWN AEROBIC ONLY Blood Culture results may not be optimal due to an excessive volume of blood received in culture bottles   Culture   Final    NO GROWTH 5 DAYS Performed at [facility], [place] [address] [place], [place] [address]    Report Status 08/18/2018 FINAL  Final  Urine culture     Status: None   Collection Time: 08/13/18  8:29 PM  Result Value Ref Range Status   Specimen Description URINE, RANDOM  Final   Special Requests NONE  Final   Culture   Final    NO GROWTH Performed at [facility], [place] [address], [place], [place] [address]    Report Status 08/15/2018 FINAL  Final  MRSA PCR Screening     Status: None   Collection Time: 08/15/18 12:09 AM  Result Value Ref Range Status   MRSA by PCR NEGATIVE NEGATIVE Final    Comment:        The GeneXpert MRSA Assay (FDA approved for NASAL specimens only), is one component of a comprehensive MRSA colonization surveillance program. It is not intended to diagnose MRSA infection nor to guide or monitor treatment for MRSA infections. Performed at [facility], [place] [address], [place], [place] [address]   Surgical pcr screen     Status: Abnormal   Collection Time: 08/18/18 10:06 AM  Result Value Ref Range Status   MRSA, PCR NEGATIVE NEGATIVE Final   Staphylococcus aureus POSITIVE (A) NEGATIVE Final    Comment: (NOTE) The Xpert SA Assay (FDA approved for NASAL specimens in patients 15 years of age and older), is one component of a comprehensive surveillance program. It is not intended to diagnose infection  nor to guide or monitor treatment. Performed at [facility], [place] [address], [place], [place] [address]   Aerobic/Anaerobic Culture (surgical/deep wound)     Status: Abnormal (Preliminary result)   Collection Time: 08/18/18  6:14 PM  Result Value Ref Range Status   Specimen Description TISSUE  Final   Special Requests PENOSCROTAL  Final   Gram Stain   Final    RARE WBC PRESENT, PREDOMINANTLY PMN ABUNDANT GRAM POSITIVE COCCI ABUNDANT GRAM NEGATIVE RODS ABUNDANT GRAM POSITIVE RODS Performed at [facility], [address], [place],  [address]    Culture (A)  Final    MULTIPLE ORGANISMS PRESENT, NONE PREDOMINANT NO ANAEROBES ISOLATED; CULTURE IN PROGRESS FOR 5 DAYS    Report Status PENDING  Incomplete  Aerobic/Anaerobic Culture (surgical/deep wound)     Status: None (Preliminary result)   Collection Time: 08/18/18  6:16 PM  Result Value Ref Range Status   Specimen Description ABSCESS  Final   Special Requests PENOSCROTAL SWABS  Final   Gram Stain   Final    NO WBC SEEN ABUNDANT GRAM POSITIVE COCCI MODERATE GRAM NEGATIVE RODS MODERATE GRAM POSITIVE RODS Performed at [facility], [address] [place],  [address]    Culture   Final    NORMAL SKIN FLORA NO ANAEROBES ISOLATED; CULTURE IN PROGRESS FOR 5 DAYS    Report Status PENDING  Incomplete    RADIOLOGY STUDIES/RESULTS: Ct Abdomen Pelvis Wo Contrast  Result Date: 08/13/2018 CLINICAL DATA:  Groin swelling EXAM: CT ABDOMEN AND PELVIS WITHOUT CONTRAST TECHNIQUE: Multidetector CT imaging of the abdomen and pelvis was performed following  the standard protocol without IV contrast. COMPARISON:  MRI 07/10/2018 FINDINGS: Lower chest: Lung bases demonstrate no acute consolidation or effusion. The heart size is normal. Small hiatal hernia. Hepatobiliary: No focal liver abnormality is seen. No gallstones, gallbladder wall thickening, or biliary dilatation. Pancreas: Unremarkable. No pancreatic ductal  dilatation or surrounding inflammatory changes. Spleen: Normal in size without focal abnormality. Adrenals/Urinary Tract: Adrenal glands are within normal limits. No hydronephrosis. Thick-walled urinary bladder Stomach/Bowel: Stomach is nonenlarged. No dilated small bowel. Extensive gas collection Between the rectum and prostate. Vascular/Lymphatic: Mild aortic atherosclerosis. No aneurysm. No significantly enlarged lymph nodes. Reproductive: Metallic densities along the posterior aspect of the prostate. Large gas collections around the prostate gland. Moderate edema of the scrotum and around the penis. Other: No free air. Fat in the left inguinal canal. Edema within the pubic soft tissues. Gas and fluid collection extending from the rectal area and along both sides of the prostate gland. Musculoskeletal: No acute or suspicious abnormality. IMPRESSION: 1. Moderate edema and mild inflammatory changes involving the scrotum and soft tissues about the penis, but without soft tissue emphysema. Mild edema within the pubic soft tissues. 2. Abnormal gas and fluid collection between the rectum and prostate gland and extending along the right and left aspects of the prostate gland, felt to correspond to previously demonstrated rectal ulcer and sinus tracks. 3. Thick-walled appearance of the urinary bladder, possible cystitis Electronically Signed   By: Donavan Foil M.D.   On: 08/13/2018 23:19   Dg Chest 2 View  Result Date: 08/13/2018 CLINICAL DATA:  Acute onset of genital swelling. Fever. EXAM: CHEST - 2 VIEW COMPARISON:  Chest radiograph performed 07/07/2018 FINDINGS: The lungs are well-aerated. Minimal left basilar atelectasis is noted. There is no evidence of pleural effusion or pneumothorax. The heart is normal in size; the mediastinal contour is within normal limits. No acute osseous abnormalities are seen. IMPRESSION: Minimal left basilar atelectasis noted. Lungs otherwise clear. Electronically Signed   By: Garald Balding M.D.   On: 08/13/2018 22:16   Ct Pelvis Wo Contrast  Result Date: 08/17/2018 CLINICAL DATA:  Proctitis with micro perforation, now with abscess at penile shaft, worsening pelvic swelling EXAM: CT PELVIS WITHOUT CONTRAST TECHNIQUE: Multidetector CT imaging of the pelvis was performed following the standard protocol without intravenous contrast. COMPARISON:  CT abdomen/pelvis dated 09/09/2018. MR pelvis dated 07/10/2018. FINDINGS: Motion degraded images. Urinary Tract: Thick-walled bladder with indwelling Foley catheter and nondependent gas. Bowel: Visualized bowel is poorly evaluated but grossly unremarkable. Vascular/Lymphatic: Mild atherosclerotic calcifications of the bilateral iliac vessels. No suspicious pelvic lymphadenopathy. Reproductive: Gas within the bilateral seminal vesicles. Additional gas along the retro prostatic soft tissues (series 3/image 37) and extending along the right pelvic sidewall (series 3/image 31), unchanged. Fiducial radiation markers along the posterior prostate. One of these markers is now displaced posterior to the rectum (series 3/image 29), new from recent CT. Other: Subcutaneous stranding along the anterior lower pelvic wall, left greater than right (series 3/image 32), progressive. Additional fluid/stranding along the proximal penile shaft (series 3/image 54) with diffuse scrotal edema (series 3/image 68). This appearance is compatible with cellulitis. No drainable fluid collection/abscess on CT. Musculoskeletal: Visualized osseous structures are within normal limits. IMPRESSION: Gas along the bilateral seminal vesicles and retro prostatic soft tissues, likely related to known rectal microperforation, unchanged. Associated cellulitis involving the lower pelvic wall, penile shaft, and scrotum. No drainable fluid collection/abscess on CT. Displaced fiducial radiation marker now posterior to the rectum, new from recent CT. Electronically Signed  By: Julian Hy M.D.    On: 08/17/2018 11:34   US Renal  Result Date: 08/14/2018 CLINICAL DATA:  Acute renal failure. EXAM: RENAL / URINARY TRACT ULTRASOUND COMPLETE COMPARISON:  Noncontrast CT yesterday. FINDINGS: Right Kidney: Renal measurements: 12.6 x 5.2 x 6.1 cm = volume: 207 mL . Echogenicity within normal limits. No mass or hydronephrosis visualized. Left Kidney: Renal measurements: 12.8 x 5.1 x 5.7 cm = volume: 195 mL. Echogenicity within normal limits. No mass or hydronephrosis visualized. Bladder: Bladder wall thickening of 9 mm, as seen on CT. IMPRESSION: 1. Bladder wall thickening, similar to CT yesterday. 2. Unremarkable sonographic appearance of both kidneys. No hydronephrosis. Electronically Signed   By: Keith Rake M.D.   On: 08/14/2018 03:48   US Scrotum W/doppler  Result Date: 08/13/2018 CLINICAL DATA:  Initial evaluation for acute testicular pain, swelling. EXAM: SCROTAL ULTRASOUND DOPPLER ULTRASOUND OF THE TESTICLES TECHNIQUE: Complete ultrasound examination of the testicles, epididymis, and other scrotal structures was performed. Color and spectral Doppler ultrasound were also utilized to evaluate blood flow to the testicles. COMPARISON:  None. FINDINGS: Right testicle Measurements: 3.5 x 2.5 x 1.9 cm. No mass lesion. Testicular microlithiasis noted. Left testicle Measurements: 3.9 x 2.2 x 2.3 cm. No mass lesion. Testicular microlithiasis noted. Right epididymis:  Normal in size and appearance. Left epididymis: Diffusely increased vascularity seen within the left epididymis, suggesting acute epididymitis. Hydrocele:  Bilateral hydroceles noted. Varicocele:  Bilateral varicoceles noted. Pulsed Doppler interrogation of both testes demonstrates normal low resistance arterial and venous waveforms bilaterally. Incidental note made of a probable small fat containing right inguinal hernia. IMPRESSION: 1. Increased vascularity within the left epididymis, suggesting acute epididymitis. No evidence for testicular  torsion. 2. Small moderate bilateral hydroceles, likely reactive. 3. Small bilateral varicoceles. 4. Probable small fat containing right inguinal hernia. 5. Testicular microlithiasis. Current literature suggests that testicular microlithiasis is not a significant independent risk factor for development of testicular carcinoma, and that follow up imaging is not warranted in the absence of other risk factors. Monthly testicular self-examination and annual physical exams are considered appropriate surveillance. If patient has other risk factors for testicular carcinoma, then referral to Urology should be considered. (Reference: DeCastro, et al.: A 5-Year Follow up Study of Asymptomatic Men with Testicular Microlithiasis. J Urol 2008; [id number].) Electronically Signed   By: Jeannine Boga M.D.   On: 08/13/2018 22:48   US Abdomen Limited Ruq  Result Date: 08/14/2018 CLINICAL DATA:  Initial evaluation for elevated AST with leukocytosis. EXAM: ULTRASOUND ABDOMEN LIMITED RIGHT UPPER QUADRANT COMPARISON:  None. FINDINGS: Gallbladder: No gallstones or wall thickening visualized. No sonographic Murphy sign noted by sonographer. Common bile duct: Diameter: 4.6 mm Liver: No focal lesion identified. Within normal limits in parenchymal echogenicity. Portal vein is patent on color Doppler imaging with normal direction of blood flow towards the liver. IMPRESSION: Normal right upper quadrant ultrasound. No cholelithiasis, evidence for acute cholecystitis, or biliary dilatation. Electronically Signed   By: Jeannine Boga M.D.   On: 08/14/2018 03:43     LOS: 7 days   Oren Binet, MD  [facility]  If 7PM-7AM, please contact night-coverage  Please page via [email or web address] [email or web address]-click on MD name and type text message  08/21/2018, 1:08 PM

## 2018-08-21 NOTE — Progress Notes (Signed)
3 Days Post-Op   Subjective/Chief Complaint: Complains of pelvic pain   Objective: Vital signs in last 24 hours: Temp:  [98.2 F (36.8 C)-101.5 F (38.6 C)] 99.4 F (37.4 C) (01/11 0443) Pulse Rate:  [98-105] 103 (01/11 0443) Resp:  [16-18] 16 (01/11 0443) BP: (133-156)/(70-80) 151/75 (01/11 0443) SpO2:  [97 %-98 %] 97 % (01/11 0443) Last BM Date: 08/20/18  Intake/Output from previous day: 01/10 0701 - 01/11 0700 In: 1490.5 [P.O.:840; I.V.:450.5; IV Piggyback:200] Out: 9163 [Urine:3450] Intake/Output this shift: No intake/output data recorded.  General appearance: alert and cooperative Resp: clear to auscultation bilaterally Cardio: regular rate and rhythm GI: soft, non-tender; bowel sounds normal; no masses,  no organomegaly Male genitalia: open penile wounds with lots of purulent drainage  Lab Results:  Recent Labs    08/20/18 0522 08/21/18 0318  WBC 11.5* 10.7*  HGB 7.2* 7.3*  HCT 23.1* 23.4*  PLT 560* 629*   BMET Recent Labs    08/20/18 0522 08/21/18 0318  NA 135 134*  K 3.9 4.0  CL 105 105  CO2 21* 21*  GLUCOSE 111* 90  BUN 33* 24*  CREATININE 1.43* 1.49*  CALCIUM 7.8* 7.9*   PT/INR No results for input(s): LABPROT, INR in the last 72 hours. ABG No results for input(s): PHART, HCO3 in the last 72 hours.  Invalid input(s): PCO2, PO2  Studies/Results: No results found.  Anti-infectives: Anti-infectives (From admission, onward)   Start     Dose/Rate Route Frequency Ordered Stop   08/20/18 2200  ceFEPIme (MAXIPIME) 2 g in sodium chloride 0.9 % 100 mL IVPB  Status:  Discontinued     2 g 200 mL/hr over 30 Minutes Intravenous Every 12 hours 08/20/18 1315 08/20/18 1406   08/20/18 1500  Ampicillin-Sulbactam (UNASYN) 3 g in sodium chloride 0.9 % 100 mL IVPB     3 g 200 mL/hr over 30 Minutes Intravenous Every 6 hours 08/20/18 1420     08/17/18 1400  metroNIDAZOLE (FLAGYL) tablet 500 mg  Status:  Discontinued     500 mg Oral Every 8 hours 08/17/18  1303 08/20/18 1406   08/16/18 2000  ceFEPIme (MAXIPIME) 2 g in sodium chloride 0.9 % 100 mL IVPB  Status:  Discontinued     2 g 200 mL/hr over 30 Minutes Intravenous Every 24 hours 08/16/18 1328 08/20/18 1315   08/15/18 1030  vancomycin (VANCOCIN) 1,250 mg in sodium chloride 0.9 % 250 mL IVPB     1,250 mg 166.7 mL/hr over 90 Minutes Intravenous  Once 08/15/18 0942 08/15/18 1444   08/14/18 2000  ceFEPIme (MAXIPIME) 1 g in sodium chloride 0.9 % 100 mL IVPB  Status:  Discontinued     1 g 200 mL/hr over 30 Minutes Intravenous Every 24 hours 08/13/18 2052 08/16/18 1328   08/13/18 2052  vancomycin variable dose per unstable renal function (pharmacist dosing)  Status:  Discontinued      Does not apply See admin instructions 08/13/18 2052 08/16/18 1442   08/13/18 2045  ceFEPIme (MAXIPIME) 2 g in sodium chloride 0.9 % 100 mL IVPB     2 g 200 mL/hr over 30 Minutes Intravenous  Once 08/13/18 2038 08/13/18 2141   08/13/18 2045  metroNIDAZOLE (FLAGYL) IVPB 500 mg  Status:  Discontinued     500 mg 100 mL/hr over 60 Minutes Intravenous Every 8 hours 08/13/18 2038 08/17/18 1303   08/13/18 2045  vancomycin (VANCOCIN) IVPB 1000 mg/200 mL premix     1,000 mg 200 mL/hr over 60 Minutes  Intravenous  Once 08/13/18 2038 08/13/18 2333      Assessment/Plan: s/p Procedure(s): CYSTOSCOPY (N/A) IRRIGATION AND DEBRIDEMENT penal scrotal ABSCESS (N/A) Continue IV abx  Continue wound care Planning for possible diverting colostomy and urology procedure on Monday  LOS: 7 days    Phillip Blackwell 08/21/2018

## 2018-08-22 LAB — CBC
HEMATOCRIT: 22.2 % — AB (ref 39.0–52.0)
HEMOGLOBIN: 6.9 g/dL — AB (ref 13.0–17.0)
MCH: 28 pg (ref 26.0–34.0)
MCHC: 31.1 g/dL (ref 30.0–36.0)
MCV: 90.2 fL (ref 80.0–100.0)
Platelets: 594 10*3/uL — ABNORMAL HIGH (ref 150–400)
RBC: 2.46 MIL/uL — ABNORMAL LOW (ref 4.22–5.81)
RDW: 15.8 % — ABNORMAL HIGH (ref 11.5–15.5)
WBC: 9.8 10*3/uL (ref 4.0–10.5)
nRBC: 0 % (ref 0.0–0.2)

## 2018-08-22 LAB — HEMOGLOBIN AND HEMATOCRIT, BLOOD
HCT: 27.7 % — ABNORMAL LOW (ref 39.0–52.0)
Hemoglobin: 8.6 g/dL — ABNORMAL LOW (ref 13.0–17.0)

## 2018-08-22 LAB — PREPARE RBC (CROSSMATCH)

## 2018-08-22 MED ORDER — SODIUM CHLORIDE 0.9% IV SOLUTION: Freq: Once | INTRAVENOUS | Status: DC

## 2018-08-22 MED ORDER — ACETAMINOPHEN 500 MG PO TABS
1000.0000 mg | ORAL_TABLET | Freq: Once | ORAL | Status: AC
  Administered 2018-08-22: 1000 mg via ORAL
  Filled 2018-08-22: qty 2

## 2018-08-22 MED ORDER — DIPHENHYDRAMINE HCL 25 MG PO CAPS
25.0000 mg | ORAL_CAPSULE | Freq: Once | ORAL | Status: AC
  Administered 2018-08-22: 25 mg via ORAL
  Filled 2018-08-22: qty 1

## 2018-08-22 NOTE — Progress Notes (Signed)
Pt has refused CPAP for several nights.

## 2018-08-22 NOTE — Progress Notes (Signed)
MD notified of pt temp 99.3 prior to blood started. New orders received will continue to monitor.

## 2018-08-22 NOTE — Progress Notes (Signed)
[facility] Progress Note  4 Days Post-Op  Subjective: CC-  No new complaints. Continues to have some lower abdominal pain. Denies n/v. Tolerating diet.  Hg 6.9 this AM, getting 1 unit PRBCs.  Objective: Vital signs in last 24 hours: Temp:  [98.1 F (36.7 C)-99.3 F (37.4 C)] 99.3 F (37.4 C) (01/12 0932) Pulse Rate:  [67-104] 91 (01/12 0932) Resp:  [18-19] 19 (01/12 0647) BP: (91-151)/(60-77) 135/61 (01/12 0932) SpO2:  [96 %-100 %] 97 % (01/12 0932) Last BM Date: 08/20/18  Intake/Output from previous day: 01/11 0701 - 01/12 0700 In: 490 [IV Piggyback:490] Out: 3250 [Urine:3250] Intake/Output this shift: No intake/output data recorded.  PE: Gen:  Alert, NAD, pleasant HEENT: EOM's intact, pupils equal and round Pulm:  effort normal Abd: Soft, ND, +BS, no HSM, mild lower abdominal TTP GU: edema noted to penis and scrotum, penrose in place with purulent drainage   Lab Results:  Recent Labs    08/21/18 0318 08/22/18 0409  WBC 10.7* 9.8  HGB 7.3* 6.9*  HCT 23.4* 22.2*  PLT 629* 594*   BMET Recent Labs    08/20/18 0522 08/21/18 0318  NA 135 134*  K 3.9 4.0  CL 105 105  CO2 21* 21*  GLUCOSE 111* 90  BUN 33* 24*  CREATININE 1.43* 1.49*  CALCIUM 7.8* 7.9*   PT/INR No results for input(s): LABPROT, INR in the last 72 hours. CMP     Component Value Date/Time   NA 134 (L) 08/21/2018 0318   K 4.0 08/21/2018 0318   CL 105 08/21/2018 0318   CO2 21 (L) 08/21/2018 0318   GLUCOSE 90 08/21/2018 0318   BUN 24 (H) 08/21/2018 0318   CREATININE 1.49 (H) 08/21/2018 0318   CALCIUM 7.9 (L) 08/21/2018 0318   PROT 6.2 (L) 08/19/2018 0339   ALBUMIN 1.2 (L) 08/19/2018 0339   AST 42 (H) 08/19/2018 0339   ALT 31 08/19/2018 0339   ALKPHOS 66 08/19/2018 0339   BILITOT 1.6 (H) 08/19/2018 0339   GFRNONAA 51 (L) 08/21/2018 0318   GFRAA 59 (L) 08/21/2018 0318   Lipase     Component Value Date/Time   LIPASE 33 08/13/2018 2047        Studies/Results: No results found.  Anti-infectives: Anti-infectives (From admission, onward)   Start     Dose/Rate Route Frequency Ordered Stop   08/20/18 2200  ceFEPIme (MAXIPIME) 2 g in sodium chloride 0.9 % 100 mL IVPB  Status:  Discontinued     2 g 200 mL/hr over 30 Minutes Intravenous Every 12 hours 08/20/18 1315 08/20/18 1406   08/20/18 1500  Ampicillin-Sulbactam (UNASYN) 3 g in sodium chloride 0.9 % 100 mL IVPB     3 g 200 mL/hr over 30 Minutes Intravenous Every 6 hours 08/20/18 1420     08/17/18 1400  metroNIDAZOLE (FLAGYL) tablet 500 mg  Status:  Discontinued     500 mg Oral Every 8 hours 08/17/18 1303 08/20/18 1406   08/16/18 2000  ceFEPIme (MAXIPIME) 2 g in sodium chloride 0.9 % 100 mL IVPB  Status:  Discontinued     2 g 200 mL/hr over 30 Minutes Intravenous Every 24 hours 08/16/18 1328 08/20/18 1315   08/15/18 1030  vancomycin (VANCOCIN) 1,250 mg in sodium chloride 0.9 % 250 mL IVPB     1,250 mg 166.7 mL/hr over 90 Minutes Intravenous  Once 08/15/18 0942 08/15/18 1444   08/14/18 2000  ceFEPIme (MAXIPIME) 1 g in sodium chloride 0.9 % 100 mL IVPB  Status:  Discontinued     1 g 200 mL/hr over 30 Minutes Intravenous Every 24 hours 08/13/18 2052 08/16/18 1328   08/13/18 2052  vancomycin variable dose per unstable renal function (pharmacist dosing)  Status:  Discontinued      Does not apply See admin instructions 08/13/18 2052 08/16/18 1442   08/13/18 2045  ceFEPIme (MAXIPIME) 2 g in sodium chloride 0.9 % 100 mL IVPB     2 g 200 mL/hr over 30 Minutes Intravenous  Once 08/13/18 2038 08/13/18 2141   08/13/18 2045  metroNIDAZOLE (FLAGYL) IVPB 500 mg  Status:  Discontinued     500 mg 100 mL/hr over 60 Minutes Intravenous Every 8 hours 08/13/18 2038 08/17/18 1303   08/13/18 2045  vancomycin (VANCOCIN) IVPB 1000 mg/200 mL premix     1,000 mg 200 mL/hr over 60 Minutes Intravenous  Once 08/13/18 2038 08/13/18 2333       Assessment/Plan Hx of prostate CA, recurrent,  with external beam radiation Gleason 6 OSA - CPAP BPH Hiccups x 1 month AKI - creatinine 1.49 (1/11), good UOP Anemia - chronic disease  Full thickness perforation of rectal ulcer Severe sepsis - prostatitis/epididymitis/colitis/severe penoscrotal infection - WBC trending down 9.8 -s/p 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 Tresa Moore - Plan for OR tomorrow with urology and Dr. Georgette Dover, repeat wound debridement and colostomy. WOC marked for ostomy.  ID - Maxipime 1/3>> 1/10,Flagyl 1/3>>1/10, unasyn 1/10>>day#3 FEN - Reg diet, Ensure, NPO after MN VTE - SCDs, no chemical DVT prophylaxis due to anemia Foley - in place   LOS: 8 days    Wellington Hampshire , [facility] Surgery 08/22/2018, 9:43 AM Pager: [phone] Mon 7:00 am -11:30 AM Tues-Fri 7:00 am-4:30 pm Sat-Sun 7:00 am-11:30 am

## 2018-08-22 NOTE — Progress Notes (Signed)
PROGRESS NOTE        PATIENT DETAILS Name: Phillip Blackwell Age: 58 y.o. Sex: male Date of Birth: [birth year] Admit Date: 08/13/2018 Admitting Physician Shela Leff, MD [id number], Pcp Not In  Brief Narrative: Patient is a 58 y.o. male with history of hypertension, BPH, OSA prostate cancer status post external beam radiation-presented with sepsis secondary to rectal ulcer with perforation with seeding of the GU tract causing epididymitis/prostatitis.  Further hospital course complicated by worsening penile/scrotal infection and AKI.  See below for further details  Subjective: Lying comfortably in bed-afebrile overnight.  No chest pain or shortness of breath.  Assessment/Plan: Severe sepsis secondary to perforated rectal ulcer with prostatitis/epididymitis and penoscrotal abscess: Sepsis pathophysiology has resolved-however febrile last night.  Underwent I&D of penoscrotal abscess on 1/8.  Blood culture positive for eggerthella lenta.  Initially on cefepime and Flagyl-after discussion with infectious disease-Dr. Michel Bickers on 1/10-transitioned to Unasyn.  Plan is for total 3 weeks of antimicrobial therapy, once patient is further stable-we will transition to Augmentin.    AKI: Likely hemodynamically mediated-from ATN in the setting of sepsis.  Renal function has improved-creatinine has plateaued to 1.4 range.  Continue to monitor electrolytes periodically.    Multifactorial anemia secondary to acute blood loss anemia and secondary to acute/critical illness: Hemoglobin continues to slowly downtrend-now less than 7-plan is to transfuse 1 unit of PRBC today.  Recheck CBC tomorrow.   Hyperbilirubinemia: Appears to be mostly direct hyperbilirubinemia-no biliary dilatation seen on CT scan.  Bilirubin has normalized-not sure if this was all secondary to sepsis.  Follow periodically.    Hypertension: Controlled-continue with metoprolol and prazosin.  Dyslipidemia:  Continue statin  BPH: Continue Flomax  OSA: CPAP nightly  Moderate protein calorie malnutrition: Continue supplements  Acute debility/deconditioning: Likely secondary to acute illness-will likely require SNF for CIR on discharge.  DVT Prophylaxis: SCD's  Code Status: Full code  Family Communication: Spouse at bedside  Disposition Plan: Remain inpatient-requires several more days of hospitalization before consideration of discharge  Antimicrobial agents: Anti-infectives (From admission, onward)   Start     Dose/Rate Route Frequency Ordered Stop   08/20/18 2200  ceFEPIme (MAXIPIME) 2 g in sodium chloride 0.9 % 100 mL IVPB  Status:  Discontinued     2 g 200 mL/hr over 30 Minutes Intravenous Every 12 hours 08/20/18 1315 08/20/18 1406   08/20/18 1500  Ampicillin-Sulbactam (UNASYN) 3 g in sodium chloride 0.9 % 100 mL IVPB     3 g 200 mL/hr over 30 Minutes Intravenous Every 6 hours 08/20/18 1420     08/17/18 1400  metroNIDAZOLE (FLAGYL) tablet 500 mg  Status:  Discontinued     500 mg Oral Every 8 hours 08/17/18 1303 08/20/18 1406   08/16/18 2000  ceFEPIme (MAXIPIME) 2 g in sodium chloride 0.9 % 100 mL IVPB  Status:  Discontinued     2 g 200 mL/hr over 30 Minutes Intravenous Every 24 hours 08/16/18 1328 08/20/18 1315   08/15/18 1030  vancomycin (VANCOCIN) 1,250 mg in sodium chloride 0.9 % 250 mL IVPB     1,250 mg 166.7 mL/hr over 90 Minutes Intravenous  Once 08/15/18 0942 08/15/18 1444   08/14/18 2000  ceFEPIme (MAXIPIME) 1 g in sodium chloride 0.9 % 100 mL IVPB  Status:  Discontinued     1 g 200 mL/hr over 30 Minutes Intravenous Every  24 hours 08/13/18 2052 08/16/18 1328   08/13/18 2052  vancomycin variable dose per unstable renal function (pharmacist dosing)  Status:  Discontinued      Does not apply See admin instructions 08/13/18 2052 08/16/18 1442   08/13/18 2045  ceFEPIme (MAXIPIME) 2 g in sodium chloride 0.9 % 100 mL IVPB     2 g 200 mL/hr over 30 Minutes Intravenous   Once 08/13/18 2038 08/13/18 2141   08/13/18 2045  metroNIDAZOLE (FLAGYL) IVPB 500 mg  Status:  Discontinued     500 mg 100 mL/hr over 60 Minutes Intravenous Every 8 hours 08/13/18 2038 08/17/18 1303   08/13/18 2045  vancomycin (VANCOCIN) IVPB 1000 mg/200 mL premix     1,000 mg 200 mL/hr over 60 Minutes Intravenous  Once 08/13/18 2038 08/13/18 2333      Procedures: None  CONSULTS:  general surgery and urology  Time spent: 25 minutes-Greater than 50% of this time was spent in counseling, explanation of diagnosis, planning of further management, and coordination of care.  MEDICATIONS: Scheduled Meds: . sodium chloride   Intravenous Once  . atorvastatin  40 mg Oral Q supper  . cholecalciferol  2,000 Units Oral Daily  . feeding supplement (ENSURE ENLIVE)  237 mL Oral BID BM  . ferrous sulfate  325 mg Oral Q breakfast  . metoprolol tartrate  50 mg Oral BID  . multivitamin with minerals  1 tablet Oral Daily  . mupirocin ointment  1 application Nasal BID  . mupirocin ointment  1 application Topical BID  . pantoprazole  40 mg Oral BID AC  . polycarbophil  625 mg Oral BID  . prazosin  2 mg Oral QHS   Continuous Infusions: . sodium chloride Stopped (08/17/18 0659)  . ampicillin-sulbactam (UNASYN) IV 3 g (08/22/18 0804)  . lactated ringers 10 mL/hr at 08/18/18 2212   PRN Meds:.sodium chloride, acetaminophen **OR** acetaminophen, clonazePAM, ondansetron (ZOFRAN) IV, oxyCODONE, tamsulosin   PHYSICAL EXAM: Vital signs: Vitals:   08/22/18 0932 08/22/18 1016 08/22/18 1039 08/22/18 1041  BP: 135/61 (!) 158/74 (!) 144/70 (!) 144/70  Pulse: 91 88 90 90  Resp:  18 18   Temp: 99.3 F (37.4 C) 99.4 F (37.4 C) 98.8 F (37.1 C) 98.8 F (37.1 C)  TempSrc: Oral Oral Oral Oral  SpO2: 97% 95% 99% 99%  Weight:      Height:       Filed Weights   08/13/18 1922 08/13/18 2234  Weight: 86.2 kg 86.2 kg   Body mass index is 25.77 kg/m.   General appearance:Awake, alert, not in any  distress.  Eyes:no scleral icterus. HEENT: Atraumatic and Normocephalic Neck: supple, no JVD. Resp:Good air entry bilaterally,no rales or rhonchi CVS: S1 S2 regular, no murmurs.  GI: Bowel sounds present, Non tender and not distended with no gaurding, rigidity or rebound. Extremities: B/L Lower Ext shows no edema, both legs are warm to touch Neurology:  Non focal Psychiatric: Normal judgment and insight. Normal mood. Musculoskeletal:No digital cyanosis Skin:No Rash, warm and dry Wounds:N/A  I have personally reviewed following labs and imaging studies  LABORATORY DATA: CBC: Recent Labs  Lab 08/18/18 0359 08/19/18 0339 08/20/18 0522 08/21/18 0318 08/22/18 0409  WBC 14.9* 12.0* 11.5* 10.7* 9.8  HGB 8.1* 7.4* 7.2* 7.3* 6.9*  HCT 24.2* 23.0* 23.1* 23.4* 22.2*  MCV 89.6 89.5 89.2 89.7 90.2  PLT 423* 496* 560* 629* 594*    Basic Metabolic Panel: Recent Labs  Lab 08/17/18 0506 08/18/18 0359 08/19/18 0737 08/20/18 0522  08/21/18 0318  NA 135 135 134* 135 134*  K 3.8 4.5 4.4 3.9 4.0  CL 104 104 106 105 105  CO2 20* 21* 21* 21* 21*  GLUCOSE 96 86 172* 111* 90  BUN 56* 46* 38* 33* 24*  CREATININE 2.41* 2.01* 1.55* 1.43* 1.49*  CALCIUM 8.0* 8.0* 8.0* 7.8* 7.9*    GFR: Estimated Creatinine Clearance: 58.6 mL/min (A) (by C-G formula based on SCr of 1.49 mg/dL (H)).  Liver Function Tests: Recent Labs  Lab 08/16/18 0327 08/17/18 0506 08/19/18 0339  AST 47* 41 42*  ALT 33 33 31  ALKPHOS 88 83 66  BILITOT 2.9* 2.3* 1.6*  PROT 6.1* 6.3* 6.2*  ALBUMIN 1.4* 1.4* 1.2*   No results for input(s): LIPASE, AMYLASE in the last 168 hours. No results for input(s): AMMONIA in the last 168 hours.  Coagulation Profile: No results for input(s): INR, PROTIME in the last 168 hours.  Cardiac Enzymes: No results for input(s): CKTOTAL, CKMB, CKMBINDEX, TROPONINI in the last 168 hours.  BNP (last 3 results) No results for input(s): PROBNP in the last 8760 hours.  HbA1C: No  results for input(s): HGBA1C in the last 72 hours.  CBG: Recent Labs  Lab 08/18/18 2229 08/21/18 0958  GLUCAP 100* 118*    Lipid Profile: No results for input(s): CHOL, HDL, LDLCALC, TRIG, CHOLHDL, LDLDIRECT in the last 72 hours.  Thyroid Function Tests: No results for input(s): TSH, T4TOTAL, FREET4, T3FREE, THYROIDAB in the last 72 hours.  Anemia Panel: No results for input(s): VITAMINB12, FOLATE, FERRITIN, TIBC, IRON, RETICCTPCT in the last 72 hours.  Urine analysis:    Component Value Date/Time   COLORURINE AMBER (A) 08/13/2018 2010   APPEARANCEUR CLOUDY (A) 08/13/2018 2010   LABSPEC 1.014 08/13/2018 2010   PHURINE 5.0 08/13/2018 2010   GLUCOSEU NEGATIVE 08/13/2018 2010   HGBUR NEGATIVE 08/13/2018 2010   [place] NEGATIVE 08/13/2018 2010   [place] NEGATIVE 08/13/2018 2010   PROTEINUR 30 (A) 08/13/2018 2010   NITRITE NEGATIVE 08/13/2018 2010   LEUKOCYTESUR NEGATIVE 08/13/2018 2010    Sepsis Labs: Lactic Acid, Venous    Component Value Date/Time   LATICACIDVEN 1.95 (H) 08/13/2018 2313    MICROBIOLOGY: Recent Results (from the past 240 hour(s))  Culture, blood (Routine x 2)     Status: Abnormal   Collection Time: 08/13/18  7:20 PM  Result Value Ref Range Status   Specimen Description BLOOD RIGHT ARM  Final   Special Requests   Final    BOTTLES DRAWN AEROBIC AND ANAEROBIC Blood Culture adequate volume   Culture  Setup Time   Final    GRAM POSITIVE RODS ANAEROBIC BOTTLE ONLY CRITICAL RESULT CALLED TO, READ BACK BY AND VERIFIED WITH: J. Justice, [place] ON 08/16/18 BY C. JESSUP, MLT.    Culture (A)  Final    EGGERTHELLA LENTA Standardized susceptibility testing for this organism is not available. Performed at [facility], [place] [address], [place], [place] [address]    Report Status 08/19/2018 FINAL  Final  Culture, blood (Routine x 2)     Status: None   Collection Time: 08/13/18  7:40 PM  Result Value Ref Range Status   Specimen Description  BLOOD LEFT ARM  Final   Special Requests   Final    BOTTLES DRAWN AEROBIC ONLY Blood Culture results may not be optimal due to an excessive volume of blood received in culture bottles   Culture   Final    NO GROWTH 5 DAYS  Performed at [facility], [place] [address], [place], [place] [address]    Report Status 08/18/2018 FINAL  Final  Urine culture     Status: None   Collection Time: 08/13/18  8:29 PM  Result Value Ref Range Status   Specimen Description URINE, RANDOM  Final   Special Requests NONE  Final   Culture   Final    NO GROWTH Performed at [facility], [place] [address], [place], [place] [address]    Report Status 08/15/2018 FINAL  Final  MRSA PCR Screening     Status: None   Collection Time: 08/15/18 12:09 AM  Result Value Ref Range Status   MRSA by PCR NEGATIVE NEGATIVE Final    Comment:        The GeneXpert MRSA Assay (FDA approved for NASAL specimens only), is one component of a comprehensive MRSA colonization surveillance program. It is not intended to diagnose MRSA infection nor to guide or monitor treatment for MRSA infections. Performed at [facility], [place] [address] [place], [place] [address]   Surgical pcr screen     Status: Abnormal   Collection Time: 08/18/18 10:06 AM  Result Value Ref Range Status   MRSA, PCR NEGATIVE NEGATIVE Final   Staphylococcus aureus POSITIVE (A) NEGATIVE Final    Comment: (NOTE) The Xpert SA Assay (FDA approved for NASAL specimens in patients 19 years of age and older), is one component of a comprehensive surveillance program. It is not intended to diagnose infection nor to guide or monitor treatment. Performed at [facility], [place] [address], [place], [place] [address]   Aerobic/Anaerobic Culture (surgical/deep wound)     Status: Abnormal (Preliminary result)   Collection Time: 08/18/18  6:14 PM  Result Value Ref Range Status   Specimen Description TISSUE  Final   Special Requests PENOSCROTAL  Final    Gram Stain   Final    RARE WBC PRESENT, PREDOMINANTLY PMN ABUNDANT GRAM POSITIVE COCCI ABUNDANT GRAM NEGATIVE RODS ABUNDANT GRAM POSITIVE RODS Performed at [facility], [address], [place], [place] [address]    Culture (A)  Final    MULTIPLE ORGANISMS PRESENT, NONE PREDOMINANT NO ANAEROBES ISOLATED; CULTURE IN PROGRESS FOR 5 DAYS    Report Status PENDING  Incomplete  Aerobic/Anaerobic Culture (surgical/deep wound)     Status: None (Preliminary result)   Collection Time: 08/18/18  6:16 PM  Result Value Ref Range Status   Specimen Description ABSCESS  Final   Special Requests PENOSCROTAL SWABS  Final   Gram Stain   Final    NO WBC SEEN ABUNDANT GRAM POSITIVE COCCI MODERATE GRAM NEGATIVE RODS MODERATE GRAM POSITIVE RODS Performed at [facility], [address] [place],  [address]    Culture   Final    NORMAL SKIN FLORA NO ANAEROBES ISOLATED; CULTURE IN PROGRESS FOR 5 DAYS    Report Status PENDING  Incomplete    RADIOLOGY STUDIES/RESULTS: Ct Abdomen Pelvis Wo Contrast  Result Date: 08/13/2018 CLINICAL DATA:  Groin swelling EXAM: CT ABDOMEN AND PELVIS WITHOUT CONTRAST TECHNIQUE: Multidetector CT imaging of the abdomen and pelvis was performed following the standard protocol without IV contrast. COMPARISON:  MRI 07/10/2018 FINDINGS: Lower chest: Lung bases demonstrate no acute consolidation or effusion. The heart size is normal. Small hiatal hernia. Hepatobiliary: No focal liver abnormality is seen. No gallstones, gallbladder wall thickening, or biliary dilatation. Pancreas: Unremarkable. No pancreatic ductal dilatation or surrounding inflammatory changes. Spleen: Normal in  size without focal abnormality. Adrenals/Urinary Tract: Adrenal glands are within normal limits. No hydronephrosis. Thick-walled urinary bladder Stomach/Bowel: Stomach is nonenlarged. No dilated small bowel. Extensive gas collection Between the rectum and prostate. Vascular/Lymphatic: Mild  aortic atherosclerosis. No aneurysm. No significantly enlarged lymph nodes. Reproductive: Metallic densities along the posterior aspect of the prostate. Large gas collections around the prostate gland. Moderate edema of the scrotum and around the penis. Other: No free air. Fat in the left inguinal canal. Edema within the pubic soft tissues. Gas and fluid collection extending from the rectal area and along both sides of the prostate gland. Musculoskeletal: No acute or suspicious abnormality. IMPRESSION: 1. Moderate edema and mild inflammatory changes involving the scrotum and soft tissues about the penis, but without soft tissue emphysema. Mild edema within the pubic soft tissues. 2. Abnormal gas and fluid collection between the rectum and prostate gland and extending along the right and left aspects of the prostate gland, felt to correspond to previously demonstrated rectal ulcer and sinus tracks. 3. Thick-walled appearance of the urinary bladder, possible cystitis Electronically Signed   By: Donavan Foil M.D.   On: 08/13/2018 23:19   Dg Chest 2 View  Result Date: 08/13/2018 CLINICAL DATA:  Acute onset of genital swelling. Fever. EXAM: CHEST - 2 VIEW COMPARISON:  Chest radiograph performed 07/07/2018 FINDINGS: The lungs are well-aerated. Minimal left basilar atelectasis is noted. There is no evidence of pleural effusion or pneumothorax. The heart is normal in size; the mediastinal contour is within normal limits. No acute osseous abnormalities are seen. IMPRESSION: Minimal left basilar atelectasis noted. Lungs otherwise clear. Electronically Signed   By: Garald Balding M.D.   On: 08/13/2018 22:16   Ct Pelvis Wo Contrast  Result Date: 08/17/2018 CLINICAL DATA:  Proctitis with micro perforation, now with abscess at penile shaft, worsening pelvic swelling EXAM: CT PELVIS WITHOUT CONTRAST TECHNIQUE: Multidetector CT imaging of the pelvis was performed following the standard protocol without intravenous contrast.  COMPARISON:  CT abdomen/pelvis dated 09/09/2018. MR pelvis dated 07/10/2018. FINDINGS: Motion degraded images. Urinary Tract: Thick-walled bladder with indwelling Foley catheter and nondependent gas. Bowel: Visualized bowel is poorly evaluated but grossly unremarkable. Vascular/Lymphatic: Mild atherosclerotic calcifications of the bilateral iliac vessels. No suspicious pelvic lymphadenopathy. Reproductive: Gas within the bilateral seminal vesicles. Additional gas along the retro prostatic soft tissues (series 3/image 37) and extending along the right pelvic sidewall (series 3/image 31), unchanged. Fiducial radiation markers along the posterior prostate. One of these markers is now displaced posterior to the rectum (series 3/image 29), new from recent CT. Other: Subcutaneous stranding along the anterior lower pelvic wall, left greater than right (series 3/image 32), progressive. Additional fluid/stranding along the proximal penile shaft (series 3/image 54) with diffuse scrotal edema (series 3/image 68). This appearance is compatible with cellulitis. No drainable fluid collection/abscess on CT. Musculoskeletal: Visualized osseous structures are within normal limits. IMPRESSION: Gas along the bilateral seminal vesicles and retro prostatic soft tissues, likely related to known rectal microperforation, unchanged. Associated cellulitis involving the lower pelvic wall, penile shaft, and scrotum. No drainable fluid collection/abscess on CT. Displaced fiducial radiation marker now posterior to the rectum, new from recent CT. Electronically Signed   By: Julian Hy M.D.   On: 08/17/2018 11:34   US Renal  Result Date: 08/14/2018 CLINICAL DATA:  Acute renal failure. EXAM: RENAL / URINARY TRACT ULTRASOUND COMPLETE COMPARISON:  Noncontrast CT yesterday. FINDINGS: Right Kidney: Renal measurements: 12.6 x 5.2 x 6.1 cm = volume: 207 mL . Echogenicity within normal  limits. No mass or hydronephrosis visualized. Left Kidney:  Renal measurements: 12.8 x 5.1 x 5.7 cm = volume: 195 mL. Echogenicity within normal limits. No mass or hydronephrosis visualized. Bladder: Bladder wall thickening of 9 mm, as seen on CT. IMPRESSION: 1. Bladder wall thickening, similar to CT yesterday. 2. Unremarkable sonographic appearance of both kidneys. No hydronephrosis. Electronically Signed   By: Keith Rake M.D.   On: 08/14/2018 03:48   US Scrotum W/doppler  Result Date: 08/13/2018 CLINICAL DATA:  Initial evaluation for acute testicular pain, swelling. EXAM: SCROTAL ULTRASOUND DOPPLER ULTRASOUND OF THE TESTICLES TECHNIQUE: Complete ultrasound examination of the testicles, epididymis, and other scrotal structures was performed. Color and spectral Doppler ultrasound were also utilized to evaluate blood flow to the testicles. COMPARISON:  None. FINDINGS: Right testicle Measurements: 3.5 x 2.5 x 1.9 cm. No mass lesion. Testicular microlithiasis noted. Left testicle Measurements: 3.9 x 2.2 x 2.3 cm. No mass lesion. Testicular microlithiasis noted. Right epididymis:  Normal in size and appearance. Left epididymis: Diffusely increased vascularity seen within the left epididymis, suggesting acute epididymitis. Hydrocele:  Bilateral hydroceles noted. Varicocele:  Bilateral varicoceles noted. Pulsed Doppler interrogation of both testes demonstrates normal low resistance arterial and venous waveforms bilaterally. Incidental note made of a probable small fat containing right inguinal hernia. IMPRESSION: 1. Increased vascularity within the left epididymis, suggesting acute epididymitis. No evidence for testicular torsion. 2. Small moderate bilateral hydroceles, likely reactive. 3. Small bilateral varicoceles. 4. Probable small fat containing right inguinal hernia. 5. Testicular microlithiasis. Current literature suggests that testicular microlithiasis is not a significant independent risk factor for development of testicular carcinoma, and that follow up imaging  is not warranted in the absence of other risk factors. Monthly testicular self-examination and annual physical exams are considered appropriate surveillance. If patient has other risk factors for testicular carcinoma, then referral to Urology should be considered. (Reference: DeCastro, et al.: A 5-Year Follow up Study of Asymptomatic Men with Testicular Microlithiasis. J Urol 2008; [id number].) Electronically Signed   By: Jeannine Boga M.D.   On: 08/13/2018 22:48   US Abdomen Limited Ruq  Result Date: 08/14/2018 CLINICAL DATA:  Initial evaluation for elevated AST with leukocytosis. EXAM: ULTRASOUND ABDOMEN LIMITED RIGHT UPPER QUADRANT COMPARISON:  None. FINDINGS: Gallbladder: No gallstones or wall thickening visualized. No sonographic Murphy sign noted by sonographer. Common bile duct: Diameter: 4.6 mm Liver: No focal lesion identified. Within normal limits in parenchymal echogenicity. Portal vein is patent on color Doppler imaging with normal direction of blood flow towards the liver. IMPRESSION: Normal right upper quadrant ultrasound. No cholelithiasis, evidence for acute cholecystitis, or biliary dilatation. Electronically Signed   By: Jeannine Boga M.D.   On: 08/14/2018 03:43     LOS: 8 days   Oren Binet, MD  [facility]  If 7PM-7AM, please contact night-coverage  Please page via [email or web address]-click on MD name and type text message  08/22/2018, 12:45 PM

## 2018-08-22 NOTE — Plan of Care (Signed)
  Problem: Education: Goal: Knowledge of General Education information will improve Description Including pain rating scale, medication(s)/side effects and non-pharmacologic comfort measures Outcome: Progressing   

## 2018-08-23 ENCOUNTER — Inpatient Hospital Stay (HOSPITAL_COMMUNITY): Payer: No Typology Code available for payment source | Admitting: Anesthesiology

## 2018-08-23 ENCOUNTER — Encounter (HOSPITAL_COMMUNITY): Payer: Self-pay | Admitting: Anesthesiology

## 2018-08-23 ENCOUNTER — Encounter (HOSPITAL_COMMUNITY)
Admission: EM | Disposition: A | Discharge status: Home Health | Payer: Self-pay | Source: Home / Self Care | Attending: Internal Medicine

## 2018-08-23 HISTORY — PX: LAPAROSCOPIC PARTIAL COLECTOMY: SHX5907

## 2018-08-23 HISTORY — PX: SCROTAL EXPLORATION: SHX2386

## 2018-08-23 LAB — CBC
HCT: 26.7 % — ABNORMAL LOW (ref 39.0–52.0)
Hemoglobin: 8.1 g/dL — ABNORMAL LOW (ref 13.0–17.0)
MCH: 27.5 pg (ref 26.0–34.0)
MCHC: 30.3 g/dL (ref 30.0–36.0)
MCV: 90.5 fL (ref 80.0–100.0)
PLATELETS: 625 10*3/uL — AB (ref 150–400)
RBC: 2.95 MIL/uL — ABNORMAL LOW (ref 4.22–5.81)
RDW: 15.5 % (ref 11.5–15.5)
WBC: 9.1 10*3/uL (ref 4.0–10.5)
nRBC: 0 % (ref 0.0–0.2)

## 2018-08-23 LAB — BASIC METABOLIC PANEL
Anion gap: 7 (ref 5–15)
BUN: 9 mg/dL (ref 6–20)
CO2: 23 mmol/L (ref 22–32)
Calcium: 7.9 mg/dL — ABNORMAL LOW (ref 8.9–10.3)
Chloride: 106 mmol/L (ref 98–111)
Creatinine, Ser: 1.33 mg/dL — ABNORMAL HIGH (ref 0.61–1.24)
GFR calc Af Amer: >60 mL/min (ref >60)
GFR calc non Af Amer: 58 mL/min — ABNORMAL LOW (ref >60)
GLUCOSE: 101 mg/dL — AB (ref 70–99)
Potassium: 3.9 mmol/L (ref 3.5–5.1)
Sodium: 136 mmol/L (ref 135–145)

## 2018-08-23 LAB — AEROBIC/ANAEROBIC CULTURE W GRAM STAIN (SURGICAL/DEEP WOUND)

## 2018-08-23 LAB — AEROBIC/ANAEROBIC CULTURE (SURGICAL/DEEP WOUND)
CULTURE: NORMAL
GRAM STAIN: NONE SEEN

## 2018-08-23 LAB — TYPE AND SCREEN
ABO/RH(D): O POS
Antibody Screen: NEGATIVE
UNIT DIVISION: 0

## 2018-08-23 LAB — BPAM RBC
Blood Product Expiration Date: 202002042359
ISSUE DATE / TIME: 202001121019
Unit Type and Rh: 5100

## 2018-08-23 SURGERY — LAPAROSCOPIC PARTIAL COLECTOMY
Anesthesia: General | Site: Scrotum

## 2018-08-23 MED ORDER — FENTANYL CITRATE (PF) 100 MCG/2ML IJ SOLN
25.0000 ug | INTRAMUSCULAR | Status: DC | PRN
  Administered 2018-08-23: 50 ug via INTRAVENOUS

## 2018-08-23 MED ORDER — ROCURONIUM BROMIDE 50 MG/5ML IV SOSY
PREFILLED_SYRINGE | INTRAVENOUS | Status: DC | PRN
  Administered 2018-08-23: 30 mg via INTRAVENOUS
  Administered 2018-08-23: 50 mg via INTRAVENOUS

## 2018-08-23 MED ORDER — PROPOFOL 10 MG/ML IV BOLUS
INTRAVENOUS | Status: DC | PRN
  Administered 2018-08-23: 120 mg via INTRAVENOUS

## 2018-08-23 MED ORDER — PROMETHAZINE HCL 25 MG/ML IJ SOLN: 6.2500 mg | INTRAMUSCULAR | Status: DC | PRN

## 2018-08-23 MED ORDER — FENTANYL CITRATE (PF) 100 MCG/2ML IJ SOLN
INTRAMUSCULAR | Status: DC | PRN
  Administered 2018-08-23: 50 ug via INTRAVENOUS
  Administered 2018-08-23: 25 ug via INTRAVENOUS
  Administered 2018-08-23: 50 ug via INTRAVENOUS
  Administered 2018-08-23: 25 ug via INTRAVENOUS
  Administered 2018-08-23 (×2): 50 ug via INTRAVENOUS

## 2018-08-23 MED ORDER — OXYCODONE HCL 5 MG PO TABS
5.0000 mg | ORAL_TABLET | ORAL | Status: AC | PRN
  Administered 2018-08-23: 5 mg via ORAL
  Administered 2018-08-24 (×2): 10 mg via ORAL
  Filled 2018-08-23 (×2): qty 2
  Filled 2018-08-23: qty 1

## 2018-08-23 MED ORDER — BUPIVACAINE-EPINEPHRINE (PF) 0.25% -1:200000 IJ SOLN
INTRAMUSCULAR | Status: DC | PRN
  Administered 2018-08-23: 10 mL

## 2018-08-23 MED ORDER — SODIUM CHLORIDE 0.9 % IV SOLN
3.0000 g | INTRAVENOUS | Status: AC
  Administered 2018-08-23: 3 g via INTRAVENOUS
  Filled 2018-08-23: qty 3

## 2018-08-23 MED ORDER — OXYCODONE HCL 5 MG/5ML PO SOLN: 5.0000 mg | Freq: Once | ORAL | Status: DC | PRN

## 2018-08-23 MED ORDER — SUGAMMADEX SODIUM 200 MG/2ML IV SOLN
INTRAVENOUS | Status: DC | PRN
  Administered 2018-08-23: 200 mg via INTRAVENOUS

## 2018-08-23 MED ORDER — LIDOCAINE 2% (20 MG/ML) 5 ML SYRINGE
INTRAMUSCULAR | Status: DC | PRN
  Administered 2018-08-23: 60 mg via INTRAVENOUS

## 2018-08-23 MED ORDER — ONDANSETRON HCL 4 MG/2ML IJ SOLN
INTRAMUSCULAR | Status: DC | PRN
  Administered 2018-08-23: 4 mg via INTRAVENOUS

## 2018-08-23 MED ORDER — SCOPOLAMINE 1 MG/3DAYS TD PT72
MEDICATED_PATCH | TRANSDERMAL | Status: DC | PRN
  Administered 2018-08-23: 1 via TRANSDERMAL

## 2018-08-23 MED ORDER — SCOPOLAMINE 1 MG/3DAYS TD PT72
MEDICATED_PATCH | TRANSDERMAL | Status: AC
  Filled 2018-08-23: qty 1

## 2018-08-23 MED ORDER — MIDAZOLAM HCL 5 MG/5ML IJ SOLN
INTRAMUSCULAR | Status: DC | PRN
  Administered 2018-08-23: 2 mg via INTRAVENOUS

## 2018-08-23 MED ORDER — ROCURONIUM BROMIDE 50 MG/5ML IV SOSY
PREFILLED_SYRINGE | INTRAVENOUS | Status: AC
  Filled 2018-08-23: qty 5

## 2018-08-23 MED ORDER — DEXAMETHASONE SODIUM PHOSPHATE 10 MG/ML IJ SOLN
INTRAMUSCULAR | Status: AC
  Filled 2018-08-23: qty 1

## 2018-08-23 MED ORDER — MIDAZOLAM HCL 2 MG/2ML IJ SOLN
INTRAMUSCULAR | Status: AC
  Filled 2018-08-23: qty 2

## 2018-08-23 MED ORDER — SODIUM CHLORIDE 0.9 % IV SOLN
INTRAVENOUS | Status: DC | PRN
  Administered 2018-08-23: 25 ug/min via INTRAVENOUS

## 2018-08-23 MED ORDER — PROPOFOL 10 MG/ML IV BOLUS
INTRAVENOUS | Status: AC
  Filled 2018-08-23: qty 20

## 2018-08-23 MED ORDER — FENTANYL CITRATE (PF) 250 MCG/5ML IJ SOLN
INTRAMUSCULAR | Status: AC
  Filled 2018-08-23: qty 5

## 2018-08-23 MED ORDER — SODIUM CHLORIDE 0.9 % IR SOLN
Status: DC | PRN
  Administered 2018-08-23 (×2): 3000 mL

## 2018-08-23 MED ORDER — LACTATED RINGERS IV SOLN
INTRAVENOUS | Status: DC
  Administered 2018-08-23 – 2018-08-26 (×5): via INTRAVENOUS

## 2018-08-23 MED ORDER — SODIUM CHLORIDE (PF) 0.9 % IJ SOLN
INTRAMUSCULAR | Status: AC
  Filled 2018-08-23: qty 10

## 2018-08-23 MED ORDER — KETOROLAC TROMETHAMINE 30 MG/ML IJ SOLN
INTRAMUSCULAR | Status: AC
  Filled 2018-08-23: qty 1

## 2018-08-23 MED ORDER — ONDANSETRON HCL 4 MG/2ML IJ SOLN
INTRAMUSCULAR | Status: AC
  Filled 2018-08-23: qty 2

## 2018-08-23 MED ORDER — BUPIVACAINE-EPINEPHRINE (PF) 0.25% -1:200000 IJ SOLN
INTRAMUSCULAR | Status: AC
  Filled 2018-08-23: qty 30

## 2018-08-23 MED ORDER — SODIUM CHLORIDE 0.9 % IV SOLN
3.0000 g | Freq: Four times a day (QID) | INTRAVENOUS | Status: DC
  Administered 2018-08-23 – 2018-09-02 (×39): 3 g via INTRAVENOUS
  Filled 2018-08-23 (×42): qty 3

## 2018-08-23 MED ORDER — DEXAMETHASONE SODIUM PHOSPHATE 10 MG/ML IJ SOLN
INTRAMUSCULAR | Status: DC | PRN
  Administered 2018-08-23: 5 mg via INTRAVENOUS

## 2018-08-23 MED ORDER — FENTANYL CITRATE (PF) 100 MCG/2ML IJ SOLN
INTRAMUSCULAR | Status: AC
  Filled 2018-08-23: qty 2

## 2018-08-23 MED ORDER — OXYCODONE HCL 5 MG PO TABS: 5.0000 mg | ORAL_TABLET | Freq: Once | ORAL | Status: DC | PRN

## 2018-08-23 MED ORDER — METOPROLOL TARTRATE 50 MG PO TABS
75.0000 mg | ORAL_TABLET | Freq: Two times a day (BID) | ORAL | Status: DC
  Administered 2018-08-23 – 2018-08-24 (×2): 75 mg via ORAL
  Filled 2018-08-23 (×2): qty 1

## 2018-08-23 SURGICAL SUPPLY — 53 items
BLADE CLIPPER SURG (BLADE) ×2 IMPLANT
BLADE SURG 10 STRL SS (BLADE) ×2 IMPLANT
CANISTER SUCT 3000ML PPV (MISCELLANEOUS) ×4 IMPLANT
CHLORAPREP W/TINT 26ML (MISCELLANEOUS) ×4 IMPLANT
CLOSURE WOUND 1/2 X4 (GAUZE/BANDAGES/DRESSINGS) ×1
COVER SURGICAL LIGHT HANDLE (MISCELLANEOUS) ×6 IMPLANT
DECANTER SPIKE VIAL GLASS SM (MISCELLANEOUS) ×4 IMPLANT
DRAIN PENROSE 1/2X12 LTX STRL (WOUND CARE) ×6 IMPLANT
DRAPE WARM FLUID 44X44 (DRAPE) ×2 IMPLANT
DRSG OPSITE POSTOP 4X8 (GAUZE/BANDAGES/DRESSINGS) IMPLANT
DRSG TEGADERM 2-3/8X2-3/4 SM (GAUZE/BANDAGES/DRESSINGS) ×2 IMPLANT
DRSG TEGADERM 4X4.75 (GAUZE/BANDAGES/DRESSINGS) ×2 IMPLANT
ELECT CAUTERY BLADE 6.4 (BLADE) ×8 IMPLANT
ELECT REM PT RETURN 9FT ADLT (ELECTROSURGICAL) ×4
ELECTRODE REM PT RTRN 9FT ADLT (ELECTROSURGICAL) ×2 IMPLANT
GAUZE SPONGE 2X2 8PLY STRL LF (GAUZE/BANDAGES/DRESSINGS) IMPLANT
GLOVE BIO SURGEON STRL SZ7 (GLOVE) ×8 IMPLANT
GLOVE BIOGEL M 7.0 STRL (GLOVE) ×6 IMPLANT
GLOVE BIOGEL PI IND STRL 7.0 (GLOVE) IMPLANT
GLOVE BIOGEL PI IND STRL 7.5 (GLOVE) ×4 IMPLANT
GLOVE BIOGEL PI INDICATOR 7.0 (GLOVE) ×6
GLOVE BIOGEL PI INDICATOR 7.5 (GLOVE) ×2
GOWN STRL REUS W/ TWL LRG LVL3 (GOWN DISPOSABLE) ×12 IMPLANT
GOWN STRL REUS W/TWL LRG LVL3 (GOWN DISPOSABLE) ×20
KIT BASIN OR (CUSTOM PROCEDURE TRAY) ×6 IMPLANT
KIT OSTOMY DRAINABLE 2.75 STR (WOUND CARE) ×2 IMPLANT
KIT TURNOVER KIT B (KITS) ×4 IMPLANT
LEGGING LITHOTOMY PAIR STRL (DRAPES) ×2 IMPLANT
NS IRRIG 1000ML POUR BTL (IV SOLUTION) ×8 IMPLANT
PACK GENERAL/GYN (CUSTOM PROCEDURE TRAY) ×2 IMPLANT
PAD ABD 8X10 STRL (GAUZE/BANDAGES/DRESSINGS) ×2 IMPLANT
PAD ARMBOARD 7.5X6 YLW CONV (MISCELLANEOUS) ×8 IMPLANT
PENCIL BUTTON HOLSTER BLD 10FT (ELECTRODE) ×6 IMPLANT
SET TUBE SMOKE EVAC HIGH FLOW (TUBING) ×4 IMPLANT
SHEARS HARMONIC ACE PLUS 36CM (ENDOMECHANICALS) IMPLANT
SLEEVE ENDOPATH XCEL 5M (ENDOMECHANICALS) ×6 IMPLANT
SPONGE GAUZE 2X2 STER 10/PKG (GAUZE/BANDAGES/DRESSINGS) ×2
SPONGE LAP 4X18 RFD (DISPOSABLE) ×2 IMPLANT
STAPLER PROXIMATE 75MM BLUE (STAPLE) ×2 IMPLANT
STRIP CLOSURE SKIN 1/2X4 (GAUZE/BANDAGES/DRESSINGS) ×1 IMPLANT
SUT ETHILON 1 TP 1 60 (SUTURE) ×2 IMPLANT
SUT SILK 2 0 SH CR/8 (SUTURE) ×2 IMPLANT
SUT SILK 2 0 TIES 10X30 (SUTURE) ×4 IMPLANT
SUT SILK 3 0 SH CR/8 (SUTURE) ×4 IMPLANT
SUT SILK 3 0 TIES 10X30 (SUTURE) ×4 IMPLANT
TOWEL OR 17X26 10 PK STRL BLUE (TOWEL DISPOSABLE) ×8 IMPLANT
TRAY FOLEY SLVR 16FR TEMP STAT (SET/KITS/TRAYS/PACK) ×2 IMPLANT
TRAY LAPAROSCOPIC MC (CUSTOM PROCEDURE TRAY) ×4 IMPLANT
TROCAR XCEL NON-BLD 5MMX100MML (ENDOMECHANICALS) ×4 IMPLANT
TUBE CONNECTING 12'X1/4 (SUCTIONS) ×2
TUBE CONNECTING 12X1/4 (SUCTIONS) ×6 IMPLANT
WATER STERILE IRR 1000ML POUR (IV SOLUTION) ×4 IMPLANT
YANKAUER SUCT BULB TIP NO VENT (SUCTIONS) ×6 IMPLANT

## 2018-08-23 NOTE — Transfer of Care (Signed)
Immediate Anesthesia Transfer of Care Note  Patient: Phillip Blackwell  Procedure(s) Performed: LAPAROSCOPIC  END COLOSTOMY CREATION (N/A Abdomen) SECONDARY PENILE/SCROTUM DEBRIDEMENT (N/A Scrotum)  Patient Location: PACU  Anesthesia Type:General  Level of Consciousness: awake, alert  and oriented  Airway & Oxygen Therapy: Patient Spontanous Breathing and Patient connected to nasal cannula oxygen  Post-op Assessment: Report given to RN, Post -op Vital signs reviewed and stable and Patient moving all extremities X 4  Post vital signs: Reviewed and stable  Last Vitals:  Vitals Value Taken Time  BP 183/93 08/23/2018 12:01 PM  Temp    Pulse 100 08/23/2018 12:02 PM  Resp 19 08/23/2018 12:02 PM  SpO2 96 % 08/23/2018 12:02 PM  Vitals shown include unvalidated device data.  Last Pain:  Vitals:   08/23/18 0800  TempSrc:   PainSc: 0-No pain      Patients Stated Pain Goal: 2 (03/55/97 4163)  Complications: No apparent anesthesia complications

## 2018-08-23 NOTE — Progress Notes (Signed)
PROGRESS NOTE        PATIENT DETAILS Name: Phillip Blackwell Age: 58 y.o. Sex: male Date of Birth: 10/29/1960 Admit Date: 08/13/2018 Admitting Physician Shela Leff, MD [id number], Pcp Not In  Brief Narrative: Patient is a 58 y.o. male with history of hypertension, BPH, OSA prostate cancer status post external beam radiation-presented with sepsis secondary to rectal ulcer with perforation with seeding of the GU tract causing epididymitis/prostatitis.  Further hospital course complicated by worsening penile/scrotal infection and AKI.  See below for further details  Subjective: Lying comfortably in bed-afebrile overnight.  No chest pain or shortness of breath.  Assessment/Plan: Severe sepsis secondary to perforated rectal ulcer with prostatitis/epididymitis and penoscrotal abscess along with eggerthella lenta bacteremia: Sepsis pathophysiology resolved-low-grade fever overnight-underwent I&D of penoscrotal abscess on 1/8 and 1/13.  Both general surgery and urology following-underwent diverting colostomy on 1/13 as well.  This MD spoke with Dr. Michel Bickers on 1/10-recommendations were for 3 weeks of antimicrobial therapy-currently on Unasyn-when more stable we will transition to Augmentin.  AKI: Likely hemodynamically mediated-from ATN in the setting of sepsis.  Renal function has improved-creatinine has plateaued to 1.4 range.  Continue to monitor electrolytes periodically.    Multifactorial anemia secondary to acute blood loss anemia and secondary to acute/critical illness: Hemoglobin stable following 1 unit of PRBC transfusion-continue to follow CBC periodically.  No evidence of ongoing blood loss-suspect that drop in hemoglobin is more from acute illness.    Hyperbilirubinemia: Appears to be mostly direct hyperbilirubinemia-no biliary dilatation seen on CT scan.  Bilirubin has normalized-not sure if this was all secondary to sepsis.  Follow periodically.     Hypertension: Uncontrolled-increase metoprolol to 75 mg twice daily, continue prazosin.  Will follow and adjust accordingly.  Dyslipidemia: Continue statin  BPH: Continue Flomax  OSA: CPAP nightly-but per RT note-patient has been refusing lately-we will counsel  Moderate protein calorie malnutrition: Continue supplements  Acute debility/deconditioning: Likely secondary to acute illness-will likely require SNF for CIR on discharge.  DVT Prophylaxis: SCD's  Code Status: Full code  Family Communication: Spouse at bedside  Disposition Plan: Remain inpatient-requires several more days of hospitalization before consideration of discharge  Antimicrobial agents: Anti-infectives (From admission, onward)   Start     Dose/Rate Route Frequency Ordered Stop   08/23/18 0845  Ampicillin-Sulbactam (UNASYN) 3 g in sodium chloride 0.9 % 100 mL IVPB     3 g 200 mL/hr over 30 Minutes Intravenous To ShortStay Surgical 08/23/18 0839 08/23/18 1018   08/20/18 2200  ceFEPIme (MAXIPIME) 2 g in sodium chloride 0.9 % 100 mL IVPB  Status:  Discontinued     2 g 200 mL/hr over 30 Minutes Intravenous Every 12 hours 08/20/18 1315 08/20/18 1406   08/20/18 1500  Ampicillin-Sulbactam (UNASYN) 3 g in sodium chloride 0.9 % 100 mL IVPB  Status:  Discontinued     3 g 200 mL/hr over 30 Minutes Intravenous Every 6 hours 08/20/18 1420 08/23/18 0839   08/17/18 1400  metroNIDAZOLE (FLAGYL) tablet 500 mg  Status:  Discontinued     500 mg Oral Every 8 hours 08/17/18 1303 08/20/18 1406   08/16/18 2000  ceFEPIme (MAXIPIME) 2 g in sodium chloride 0.9 % 100 mL IVPB  Status:  Discontinued     2 g 200 mL/hr over 30 Minutes Intravenous Every 24 hours 08/16/18 1328 08/20/18 1315   08/15/18  1030  vancomycin (VANCOCIN) 1,250 mg in sodium chloride 0.9 % 250 mL IVPB     1,250 mg 166.7 mL/hr over 90 Minutes Intravenous  Once 08/15/18 0942 08/15/18 1444   08/14/18 2000  ceFEPIme (MAXIPIME) 1 g in sodium chloride 0.9 % 100 mL  IVPB  Status:  Discontinued     1 g 200 mL/hr over 30 Minutes Intravenous Every 24 hours 08/13/18 2052 08/16/18 1328   08/13/18 2052  vancomycin variable dose per unstable renal function (pharmacist dosing)  Status:  Discontinued      Does not apply See admin instructions 08/13/18 2052 08/16/18 1442   08/13/18 2045  ceFEPIme (MAXIPIME) 2 g in sodium chloride 0.9 % 100 mL IVPB     2 g 200 mL/hr over 30 Minutes Intravenous  Once 08/13/18 2038 08/13/18 2141   08/13/18 2045  metroNIDAZOLE (FLAGYL) IVPB 500 mg  Status:  Discontinued     500 mg 100 mL/hr over 60 Minutes Intravenous Every 8 hours 08/13/18 2038 08/17/18 1303   08/13/18 2045  vancomycin (VANCOCIN) IVPB 1000 mg/200 mL premix     1,000 mg 200 mL/hr over 60 Minutes Intravenous  Once 08/13/18 2038 08/13/18 2333      Procedures: None  CONSULTS:  general surgery and urology  Time spent: 25 minutes-Greater than 50% of this time was spent in counseling, explanation of diagnosis, planning of further management, and coordination of care.  MEDICATIONS: Scheduled Meds: . sodium chloride   Intravenous Once  . atorvastatin  40 mg Oral Q supper  . cholecalciferol  2,000 Units Oral Daily  . feeding supplement (ENSURE ENLIVE)  237 mL Oral BID BM  . fentaNYL      . ferrous sulfate  325 mg Oral Q breakfast  . metoprolol tartrate  50 mg Oral BID  . multivitamin with minerals  1 tablet Oral Daily  . pantoprazole  40 mg Oral BID AC  . polycarbophil  625 mg Oral BID  . prazosin  2 mg Oral QHS   Continuous Infusions: . sodium chloride Stopped (08/17/18 0659)  . lactated ringers 10 mL/hr at 08/18/18 2212  . lactated ringers 10 mL/hr at 08/23/18 0851   PRN Meds:.sodium chloride, acetaminophen **OR** acetaminophen, clonazePAM, ondansetron (ZOFRAN) IV, oxyCODONE, tamsulosin   PHYSICAL EXAM: Vital signs: Vitals:   08/23/18 1300 08/23/18 1302 08/23/18 1313 08/23/18 1333  BP:  (!) 133/101  (!) 172/91  Pulse: 94 98 89 100  Resp: 20 10  (!) 27   Temp: 97.6 F (36.4 C)   97.8 F (36.6 C)  TempSrc:    Oral  SpO2: 96% 97% 98% 97%  Weight:      Height:       Filed Weights   08/13/18 1922 08/13/18 2234  Weight: 86.2 kg 86.2 kg   Body mass index is 25.77 kg/m.   General appearance:Awake, alert  Eyes:no scleral icterus. HEENT: Atraumatic and Normocephalic Neck: supple, no JVD. Resp:Good air entry bilaterally,no rales or rhonchi CVS: S1 S2 regular, no murmurs.  GI: Bowel sounds present, appropriately tender at the incision site-scrotal area well dressed-did not open (as he just came back from the operating room) Extremities: B/L Lower Ext shows no edema, both legs are warm to touch Neurology:  Non focal Psychiatric: Normal judgment and insight. Normal mood. Musculoskeletal:No digital cyanosis Skin:No Rash, warm and dry Wounds:N/A  I have personally reviewed following labs and imaging studies  LABORATORY DATA: CBC: Recent Labs  Lab 08/19/18 0339 08/20/18 0522 08/21/18 0318 08/22/18 0409 08/22/18  1454 08/23/18 0407  WBC 12.0* 11.5* 10.7* 9.8  --  9.1  HGB 7.4* 7.2* 7.3* 6.9* 8.6* 8.1*  HCT 23.0* 23.1* 23.4* 22.2* 27.7* 26.7*  MCV 89.5 89.2 89.7 90.2  --  90.5  PLT 496* 560* 629* 594*  --  625*    Basic Metabolic Panel: Recent Labs  Lab 08/18/18 0359 08/19/18 0737 08/20/18 0522 08/21/18 0318 08/23/18 0407  NA 135 134* 135 134* 136  K 4.5 4.4 3.9 4.0 3.9  CL 104 106 105 105 106  CO2 21* 21* 21* 21* 23  GLUCOSE 86 172* 111* 90 101*  BUN 46* 38* 33* 24* 9  CREATININE 2.01* 1.55* 1.43* 1.49* 1.33*  CALCIUM 8.0* 8.0* 7.8* 7.9* 7.9*    GFR: Estimated Creatinine Clearance: 65.6 mL/min (A) (by C-G formula based on SCr of 1.33 mg/dL (H)).  Liver Function Tests: Recent Labs  Lab 08/17/18 0506 08/19/18 0339  AST 41 42*  ALT 33 31  ALKPHOS 83 66  BILITOT 2.3* 1.6*  PROT 6.3* 6.2*  ALBUMIN 1.4* 1.2*   No results for input(s): LIPASE, AMYLASE in the last 168 hours. No results for input(s):  AMMONIA in the last 168 hours.  Coagulation Profile: No results for input(s): INR, PROTIME in the last 168 hours.  Cardiac Enzymes: No results for input(s): CKTOTAL, CKMB, CKMBINDEX, TROPONINI in the last 168 hours.  BNP (last 3 results) No results for input(s): PROBNP in the last 8760 hours.  HbA1C: No results for input(s): HGBA1C in the last 72 hours.  CBG: Recent Labs  Lab 08/18/18 2229 08/21/18 0958  GLUCAP 100* 118*    Lipid Profile: No results for input(s): CHOL, HDL, LDLCALC, TRIG, CHOLHDL, LDLDIRECT in the last 72 hours.  Thyroid Function Tests: No results for input(s): TSH, T4TOTAL, FREET4, T3FREE, THYROIDAB in the last 72 hours.  Anemia Panel: No results for input(s): VITAMINB12, FOLATE, FERRITIN, TIBC, IRON, RETICCTPCT in the last 72 hours.  Urine analysis:    Component Value Date/Time   COLORURINE AMBER (A) 08/13/2018 2010   APPEARANCEUR CLOUDY (A) 08/13/2018 2010   LABSPEC 1.014 08/13/2018 2010   PHURINE 5.0 08/13/2018 2010   GLUCOSEU NEGATIVE 08/13/2018 2010   HGBUR NEGATIVE 08/13/2018 2010   [place] NEGATIVE 08/13/2018 2010   [place] NEGATIVE 08/13/2018 2010   PROTEINUR 30 (A) 08/13/2018 2010   NITRITE NEGATIVE 08/13/2018 2010   LEUKOCYTESUR NEGATIVE 08/13/2018 2010    Sepsis Labs: Lactic Acid, Venous    Component Value Date/Time   LATICACIDVEN 1.95 (H) 08/13/2018 2313    MICROBIOLOGY: Recent Results (from the past 240 hour(s))  Culture, blood (Routine x 2)     Status: Abnormal   Collection Time: 08/13/18  7:20 PM  Result Value Ref Range Status   Specimen Description BLOOD RIGHT ARM  Final   Special Requests   Final    BOTTLES DRAWN AEROBIC AND ANAEROBIC Blood Culture adequate volume   Culture  Setup Time   Final    GRAM POSITIVE RODS ANAEROBIC BOTTLE ONLY CRITICAL RESULT CALLED TO, READ BACK BY AND VERIFIED WITH: J. Sawyer, Glide ON 08/16/18 BY C. JESSUP, MLT.    Culture (A)  Final    EGGERTHELLA LENTA Standardized  susceptibility testing for this organism is not available. Performed at [facility], [place] [address], [place], [place] [address]    Report Status 08/19/2018 FINAL  Final  Culture, blood (Routine x 2)     Status: None   Collection Time: 08/13/18  7:40 PM  Result Value Ref Range Status   Specimen Description BLOOD LEFT ARM  Final   Special Requests   Final    BOTTLES DRAWN AEROBIC ONLY Blood Culture results may not be optimal due to an excessive volume of blood received in culture bottles   Culture   Final    NO GROWTH 5 DAYS Performed at [facility] [address] [place], [place] [address]    Report Status 08/18/2018 FINAL  Final  Urine culture     Status: None   Collection Time: 08/13/18  8:29 PM  Result Value Ref Range Status   Specimen Description URINE, RANDOM  Final   Special Requests NONE  Final   Culture   Final    NO GROWTH Performed at [facility], [place] [address], [place], [place] [address]    Report Status 08/15/2018 FINAL  Final  MRSA PCR Screening     Status: None   Collection Time: 08/15/18 12:09 AM  Result Value Ref Range Status   MRSA by PCR NEGATIVE NEGATIVE Final    Comment:        The GeneXpert MRSA Assay (FDA approved for NASAL specimens only), is one component of a comprehensive MRSA colonization surveillance program. It is not intended to diagnose MRSA infection nor to guide or monitor treatment for MRSA infections. Performed at [facility], [place] [address], [place], [place] [address]   Surgical pcr screen     Status: Abnormal   Collection Time: 08/18/18 10:06 AM  Result Value Ref Range Status   MRSA, PCR NEGATIVE NEGATIVE Final   Staphylococcus aureus POSITIVE (A) NEGATIVE Final    Comment: (NOTE) The Xpert SA Assay (FDA approved for NASAL specimens in patients 63 years of age and older), is one component of a comprehensive surveillance program. It is not intended to diagnose infection nor to guide or monitor  treatment. Performed at [facility], [place] [address], [place], [place] [address]   Aerobic/Anaerobic Culture (surgical/deep wound)     Status: Abnormal   Collection Time: 08/18/18  6:14 PM  Result Value Ref Range Status   Specimen Description TISSUE  Final   Special Requests PENOSCROTAL  Final   Gram Stain   Final    RARE WBC PRESENT, PREDOMINANTLY PMN ABUNDANT GRAM POSITIVE COCCI ABUNDANT GRAM NEGATIVE RODS ABUNDANT GRAM POSITIVE RODS Performed at [facility], [address], [place], [place] [address]    Culture (A)  Final    MULTIPLE ORGANISMS PRESENT, NONE PREDOMINANT MIXED ANAEROBIC FLORA PRESENT.  CALL LAB IF FURTHER IID REQUIRED.    Report Status 08/23/2018 FINAL  Final  Aerobic/Anaerobic Culture (surgical/deep wound)     Status: None   Collection Time: 08/18/18  6:16 PM  Result Value Ref Range Status   Specimen Description ABSCESS  Final   Special Requests PENOSCROTAL SWABS  Final   Gram Stain   Final    NO WBC SEEN ABUNDANT GRAM POSITIVE COCCI MODERATE GRAM NEGATIVE RODS MODERATE GRAM POSITIVE RODS Performed at [facility], [place] [address], [place], [place] [address]    Culture   Final    NORMAL SKIN FLORA MIXED ANAEROBIC FLORA PRESENT.  CALL LAB IF FURTHER IID REQUIRED.    Report Status 08/23/2018 FINAL  Final    RADIOLOGY STUDIES/RESULTS: Ct Abdomen Pelvis Wo Contrast  Result Date: 08/13/2018 CLINICAL DATA:  Groin swelling EXAM: CT ABDOMEN AND PELVIS WITHOUT CONTRAST TECHNIQUE: Multidetector CT imaging of the abdomen and  pelvis was performed following the standard protocol without IV contrast. COMPARISON:  MRI 07/10/2018 FINDINGS: Lower chest: Lung bases demonstrate no acute consolidation or effusion. The heart size is normal. Small hiatal hernia. Hepatobiliary: No focal liver abnormality is seen. No gallstones, gallbladder wall thickening, or biliary dilatation. Pancreas: Unremarkable. No pancreatic ductal dilatation or surrounding inflammatory  changes. Spleen: Normal in size without focal abnormality. Adrenals/Urinary Tract: Adrenal glands are within normal limits. No hydronephrosis. Thick-walled urinary bladder Stomach/Bowel: Stomach is nonenlarged. No dilated small bowel. Extensive gas collection Between the rectum and prostate. Vascular/Lymphatic: Mild aortic atherosclerosis. No aneurysm. No significantly enlarged lymph nodes. Reproductive: Metallic densities along the posterior aspect of the prostate. Large gas collections around the prostate gland. Moderate edema of the scrotum and around the penis. Other: No free air. Fat in the left inguinal canal. Edema within the pubic soft tissues. Gas and fluid collection extending from the rectal area and along both sides of the prostate gland. Musculoskeletal: No acute or suspicious abnormality. IMPRESSION: 1. Moderate edema and mild inflammatory changes involving the scrotum and soft tissues about the penis, but without soft tissue emphysema. Mild edema within the pubic soft tissues. 2. Abnormal gas and fluid collection between the rectum and prostate gland and extending along the right and left aspects of the prostate gland, felt to correspond to previously demonstrated rectal ulcer and sinus tracks. 3. Thick-walled appearance of the urinary bladder, possible cystitis Electronically Signed   By: Donavan Foil M.D.   On: 08/13/2018 23:19   Dg Chest 2 View  Result Date: 08/13/2018 CLINICAL DATA:  Acute onset of genital swelling. Fever. EXAM: CHEST - 2 VIEW COMPARISON:  Chest radiograph performed 07/07/2018 FINDINGS: The lungs are well-aerated. Minimal left basilar atelectasis is noted. There is no evidence of pleural effusion or pneumothorax. The heart is normal in size; the mediastinal contour is within normal limits. No acute osseous abnormalities are seen. IMPRESSION: Minimal left basilar atelectasis noted. Lungs otherwise clear. Electronically Signed   By: Garald Balding M.D.   On: 08/13/2018 22:16    Ct Pelvis Wo Contrast  Result Date: 08/17/2018 CLINICAL DATA:  Proctitis with micro perforation, now with abscess at penile shaft, worsening pelvic swelling EXAM: CT PELVIS WITHOUT CONTRAST TECHNIQUE: Multidetector CT imaging of the pelvis was performed following the standard protocol without intravenous contrast. COMPARISON:  CT abdomen/pelvis dated 09/09/2018. MR pelvis dated 07/10/2018. FINDINGS: Motion degraded images. Urinary Tract: Thick-walled bladder with indwelling Foley catheter and nondependent gas. Bowel: Visualized bowel is poorly evaluated but grossly unremarkable. Vascular/Lymphatic: Mild atherosclerotic calcifications of the bilateral iliac vessels. No suspicious pelvic lymphadenopathy. Reproductive: Gas within the bilateral seminal vesicles. Additional gas along the retro prostatic soft tissues (series 3/image 37) and extending along the right pelvic sidewall (series 3/image 31), unchanged. Fiducial radiation markers along the posterior prostate. One of these markers is now displaced posterior to the rectum (series 3/image 29), new from recent CT. Other: Subcutaneous stranding along the anterior lower pelvic wall, left greater than right (series 3/image 32), progressive. Additional fluid/stranding along the proximal penile shaft (series 3/image 54) with diffuse scrotal edema (series 3/image 68). This appearance is compatible with cellulitis. No drainable fluid collection/abscess on CT. Musculoskeletal: Visualized osseous structures are within normal limits. IMPRESSION: Gas along the bilateral seminal vesicles and retro prostatic soft tissues, likely related to known rectal microperforation, unchanged. Associated cellulitis involving the lower pelvic wall, penile shaft, and scrotum. No drainable fluid collection/abscess on CT. Displaced fiducial radiation marker now posterior to the rectum, new from  recent CT. Electronically Signed   By: Julian Hy M.D.   On: 08/17/2018 11:34   US  Renal  Result Date: 08/14/2018 CLINICAL DATA:  Acute renal failure. EXAM: RENAL / URINARY TRACT ULTRASOUND COMPLETE COMPARISON:  Noncontrast CT yesterday. FINDINGS: Right Kidney: Renal measurements: 12.6 x 5.2 x 6.1 cm = volume: 207 mL . Echogenicity within normal limits. No mass or hydronephrosis visualized. Left Kidney: Renal measurements: 12.8 x 5.1 x 5.7 cm = volume: 195 mL. Echogenicity within normal limits. No mass or hydronephrosis visualized. Bladder: Bladder wall thickening of 9 mm, as seen on CT. IMPRESSION: 1. Bladder wall thickening, similar to CT yesterday. 2. Unremarkable sonographic appearance of both kidneys. No hydronephrosis. Electronically Signed   By: Keith Rake M.D.   On: 08/14/2018 03:48   US Scrotum W/doppler  Result Date: 08/13/2018 CLINICAL DATA:  Initial evaluation for acute testicular pain, swelling. EXAM: SCROTAL ULTRASOUND DOPPLER ULTRASOUND OF THE TESTICLES TECHNIQUE: Complete ultrasound examination of the testicles, epididymis, and other scrotal structures was performed. Color and spectral Doppler ultrasound were also utilized to evaluate blood flow to the testicles. COMPARISON:  None. FINDINGS: Right testicle Measurements: 3.5 x 2.5 x 1.9 cm. No mass lesion. Testicular microlithiasis noted. Left testicle Measurements: 3.9 x 2.2 x 2.3 cm. No mass lesion. Testicular microlithiasis noted. Right epididymis:  Normal in size and appearance. Left epididymis: Diffusely increased vascularity seen within the left epididymis, suggesting acute epididymitis. Hydrocele:  Bilateral hydroceles noted. Varicocele:  Bilateral varicoceles noted. Pulsed Doppler interrogation of both testes demonstrates normal low resistance arterial and venous waveforms bilaterally. Incidental note made of a probable small fat containing right inguinal hernia. IMPRESSION: 1. Increased vascularity within the left epididymis, suggesting acute epididymitis. No evidence for testicular torsion. 2. Small moderate  bilateral hydroceles, likely reactive. 3. Small bilateral varicoceles. 4. Probable small fat containing right inguinal hernia. 5. Testicular microlithiasis. Current literature suggests that testicular microlithiasis is not a significant independent risk factor for development of testicular carcinoma, and that follow up imaging is not warranted in the absence of other risk factors. Monthly testicular self-examination and annual physical exams are considered appropriate surveillance. If patient has other risk factors for testicular carcinoma, then referral to Urology should be considered. (Reference: DeCastro, et al.: A 5-Year Follow up Study of Asymptomatic Men with Testicular Microlithiasis. J Urol 2008; 841:6606-3016.) Electronically Signed   By: Jeannine Boga M.D.   On: 08/13/2018 22:48   US Abdomen Limited Ruq  Result Date: 08/14/2018 CLINICAL DATA:  Initial evaluation for elevated AST with leukocytosis. EXAM: ULTRASOUND ABDOMEN LIMITED RIGHT UPPER QUADRANT COMPARISON:  None. FINDINGS: Gallbladder: No gallstones or wall thickening visualized. No sonographic Murphy sign noted by sonographer. Common bile duct: Diameter: 4.6 mm Liver: No focal lesion identified. Within normal limits in parenchymal echogenicity. Portal vein is patent on color Doppler imaging with normal direction of blood flow towards the liver. IMPRESSION: Normal right upper quadrant ultrasound. No cholelithiasis, evidence for acute cholecystitis, or biliary dilatation. Electronically Signed   By: Jeannine Boga M.D.   On: 08/14/2018 03:43     LOS: 9 days   Oren Binet, MD  [facility]  If 7PM-7AM, please contact night-coverage  Please page via [email or web address] [email or web address]-click on MD name and type text message  08/23/2018, 2:13 PM

## 2018-08-23 NOTE — Op Note (Signed)
Preop diagnosis: Full-thickness rectal perforation secondary to prostate radiation with sepsis of GI/GU origin.  Postop diagnosis: Same Procedure performed: Laparoscopic descending colostomy with creation of Hartman's pouch Surgeon:Amariss Detamore K Teion Ballin Anesthesia: General endotracheal Indications: This is a 58 year old male with prostate cancer treated with external beam radiation.  He has developed a full-thickness rectal perforation as well as necrosis of the penis and scrotum.  He has been managed by Dr. Phebe Colla of urology and Dr. Annye English of colorectal surgery.  We are asked to perform a end colostomy to divert his fecal stream.  The patient is in the operating room for further debridement of his penis and scrotum.  Description of procedure: The patient is brought to the operating room and placed in supine position on the operating room table.  After an adequate level of general anesthesia was obtained, his abdomen was shaved, prepped with ChloraPrep and draped sterile fashion.  A timeout was taken to ensure the proper patient and proper procedure.  A 5 mm Optiview trocar was placed in the right upper quadrant.  We insufflated CO2 maintaining a maximum pressure of 15 mmHg.  The patient has no adhesions in his abdomen.  We placed another 5 mm port in the right lower quadrant.  I identified the descending colon.  This seems relatively free of any inflammation.  He has the usual attachments to the lateral abdominal wall.  I placed another 5 mm port through the area that had been marked for the colostomy.  We used a harmonic scalpel to mobilize the lateral attachments of the descending colon.  This seemed to allow enough mobilization to reach the ostomy site without undue tension.  I grasped the colon with a locking clamp.  We then released our insufflation.  I made a circular incision around the port in the left upper quadrant.  We excised the skin and subcutaneous tissue.  I made a cruciate incision in the  fascia around the port site.  We were able to exteriorize this part of the colon and grasp it with Babcock clamps.  We are able to bring up a loop of descending colon.  I created a small opening in the mesentery in this loop.  We then divided the colon with a GIA 75 stapler.  We took down some of the mesentery using the harmonic scalpel.  We were able to reduce the Hartman's pouch back into the peritoneal cavity.  There was enough mobilization of the proximal end to allow creation of an end colostomy.  We reinsufflated CO2 and reinserted the laparoscope.  No bleeding was noted.  There is no twist in the colostomy.  We released our insufflation and remove the port sites.  I then matured the colostomy by creating an opening in the end of the colon.  We matured with 3-0 Vicryl sutures.  Prior to maturing I placed several fascial sutures of 2-0 silk to secure the side of the colostomy to the edge of the fascia.  Once we had completed maturing of the colostomy I cut a ostomy appliance to fit.  The 2 port sites were closed with 4-0 Monocryl sutures.  Benzoin Steri-Strips were placed over these areas.  We used a separate set of clean instruments for closure of the port sites.  The patient is then turned over to Dr. Tresa Moore for his portion of the procedure.  Imogene Burn. Georgette Dover, MD, [facility] Surgery  General/ Trauma Surgery Beeper [phone]  08/23/2018 11:10 AM

## 2018-08-23 NOTE — Progress Notes (Signed)
Day of Surgery   Subjective/Chief Complaint:   1 - Prostate Cancer, Likely Recurrent - s/p primary external beam radiation completed 03/2017 in [place] for larger volume Gleason 6 disease. Initial PSA 5.5 and diagnosed 2017 by Dr. Alyson Ingles.  Recent PSA Course: 06/2017 - PSA 1.1 06/2018 - PSA 2.6   2 - Acute Renal Failure - Cr 7.2 / K 5.0 by ER labs 08/14/18 on eval sepsis. [place] and CT w/o hydro or distend bladder. Baseline Cr 1.1 as recently as 07/2018.  3 - Sepsis of GI/GU Origin with Prostatitis / Epididymitis / Colitis / Severe Penoscrotal Infection - lactate 3.8, WBC 42k by ET labs 08/14/18 on eval fevers and malaise. UA unremarkable, UCX negative. [place] 1/3 Egertella Scrotal US and Pelvic CT with inflmmation of epidydimis / prostate / peri-rectal gas c/w likely infection stemming from colon microperf seeding prostate and GU tract. Initially had good systemic response to IV ABX but worsened penoscrotal skin swelling and then some purlulent drainage 1/7, Repeat CT 1/7 with some progression of peri-rectal gas and new displacement of prior fiducial marker (confirming hole in rectum) and still no large fluid collections / sub-Q gas and exam with new draining sinus tracts at penoscrotal junction. 1st stage OR penoscrotal wound debridment 1/8, 3 large penrose drains place, approx 5cm2 non-viable skin removed. New Wound CX 1/8 - skin flora. +MRSA screen.    4 - Rectal Ulcer/Suspect Perforation - known DX rectal ulcer with periodic bleeding following prostate radiation 2018. Flex-Sig / BX 06/2018 of ulcer edges negative for malignancy. CT 08/14/18 with gas in peri-rectal / peri-prostatic space c/w likely microperforation.   Today " Phillip Blackwell" is seen to proceed with end colostomy and next stage peno-scrotal wound debridement.    Objective: Vital signs in last 24 hours: Temp:  [98.7 F (37.1 C)-100.9 F (38.3 C)] 100.3 F (37.9 C) (01/13 0507) Pulse Rate:  [72-98] 87 (01/13 0845) Resp:  [18-22] 18 (01/13  0507) BP: (135-170)/(61-80) 170/77 (01/13 0845) SpO2:  [93 %-99 %] 93 % (01/13 0507) Last BM Date: 08/22/18  Intake/Output from previous day: 01/12 0701 - 01/13 0700 In: 905.9 [I.V.:120; Blood:385.9; IV Piggyback:400] Out: 3176 [Urine:2675; Stool:501] Intake/Output this shift: No intake/output data recorded.   EXAM: NAD in pre-op holding, family at bedside.  Head: Normocephalic.  Eyes: Pupils are equal, round, and reactive to light.  Neck: Normal range of motion.  Cardiovascular: regular rate.  Respiratory:  Non-labored on room air GI: Soft. There is no abdominal tenderness. There is no rebound and no guarding.  Genitourinary:    Genitourinary Comments: Recent surgical drians with stable purlulence and local swelling of penis, scrotum.  Perineum with consistent purlulent drainage. Musculoskeletal: Normal range of motion.  Neurological: He is alert.  Skin: Skin is warm.  Psychiatric: He has a normal mood and affect. His behavior is normal.  Lab Results:  Recent Labs    08/22/18 0409 08/22/18 1454 08/23/18 0407  WBC 9.8  --  9.1  HGB 6.9* 8.6* 8.1*  HCT 22.2* 27.7* 26.7*  PLT 594*  --  625*   BMET Recent Labs    08/21/18 0318 08/23/18 0407  NA 134* 136  K 4.0 3.9  CL 105 106  CO2 21* 23  GLUCOSE 90 101*  BUN 24* 9  CREATININE 1.49* 1.33*  CALCIUM 7.9* 7.9*   PT/INR No results for input(s): LABPROT, INR in the last 72 hours. ABG No results for input(s): PHART, HCO3 in the last 72 hours.  Invalid input(s): PCO2,  PO2  Studies/Results: No results found.  Anti-infectives: Anti-infectives (From admission, onward)   Start     Dose/Rate Route Frequency Ordered Stop   08/23/18 0845  Ampicillin-Sulbactam (UNASYN) 3 g in sodium chloride 0.9 % 100 mL IVPB     3 g 200 mL/hr over 30 Minutes Intravenous To ShortStay Surgical 08/23/18 0839 08/24/18 0845   08/20/18 2200  ceFEPIme (MAXIPIME) 2 g in sodium chloride 0.9 % 100 mL IVPB  Status:  Discontinued     2 g 200  mL/hr over 30 Minutes Intravenous Every 12 hours 08/20/18 1315 08/20/18 1406   08/20/18 1500  Ampicillin-Sulbactam (UNASYN) 3 g in sodium chloride 0.9 % 100 mL IVPB  Status:  Discontinued     3 g 200 mL/hr over 30 Minutes Intravenous Every 6 hours 08/20/18 1420 08/23/18 0839   08/17/18 1400  metroNIDAZOLE (FLAGYL) tablet 500 mg  Status:  Discontinued     500 mg Oral Every 8 hours 08/17/18 1303 08/20/18 1406   08/16/18 2000  ceFEPIme (MAXIPIME) 2 g in sodium chloride 0.9 % 100 mL IVPB  Status:  Discontinued     2 g 200 mL/hr over 30 Minutes Intravenous Every 24 hours 08/16/18 1328 08/20/18 1315   08/15/18 1030  vancomycin (VANCOCIN) 1,250 mg in sodium chloride 0.9 % 250 mL IVPB     1,250 mg 166.7 mL/hr over 90 Minutes Intravenous  Once 08/15/18 0942 08/15/18 1444   08/14/18 2000  ceFEPIme (MAXIPIME) 1 g in sodium chloride 0.9 % 100 mL IVPB  Status:  Discontinued     1 g 200 mL/hr over 30 Minutes Intravenous Every 24 hours 08/13/18 2052 08/16/18 1328   08/13/18 2052  vancomycin variable dose per unstable renal function (pharmacist dosing)  Status:  Discontinued      Does not apply See admin instructions 08/13/18 2052 08/16/18 1442   08/13/18 2045  ceFEPIme (MAXIPIME) 2 g in sodium chloride 0.9 % 100 mL IVPB     2 g 200 mL/hr over 30 Minutes Intravenous  Once 08/13/18 2038 08/13/18 2141   08/13/18 2045  metroNIDAZOLE (FLAGYL) IVPB 500 mg  Status:  Discontinued     500 mg 100 mL/hr over 60 Minutes Intravenous Every 8 hours 08/13/18 2038 08/17/18 1303   08/13/18 2045  vancomycin (VANCOCIN) IVPB 1000 mg/200 mL premix     1,000 mg 200 mL/hr over 60 Minutes Intravenous  Once 08/13/18 2038 08/13/18 2333      Assessment/Plan:  1 - Prostate Cancer, Likely Recurrent - PSA pattern most c/w active disease, but no signs of metastatic involvement. May warrant tissue sampling via transperineal approach in elective setting. No further diagnostics or intervention this admission.   2 - Acute Renal  Failure - Resolved,  likely pre-renal due to sepsis. No hydro to warrant stenting / neph tubes. Rec continued foley for accurate UOP monitoring.  3 - Sepsis of GI/GU Origin with Prostatitis / Epididymitis / Colitis / Severe Penoscrotal Infection - Improving sytemically and locally on IV ABX and s/p local wound debridement.   Proceed with 2nd stage debridement today. Risks, benefits, expected peri-op course with likely need for additional stage debridments discussed with pt and family.   4 - Rectal Ulcer/Suspect Perforation - likely from progression of known radiation-induced rectal ulcer. Appreciate gen surg co-management / proceeding with colostomy today.   Alexis Frock 08/23/2018

## 2018-08-23 NOTE — Anesthesia Procedure Notes (Signed)
Procedure Name: Intubation Date/Time: 08/23/2018 9:47 AM Performed by: Kyung Rudd, CRNA Pre-anesthesia Checklist: Patient identified, Emergency Drugs available, Suction available, Patient being monitored and Timeout performed Patient Re-evaluated:Patient Re-evaluated prior to induction Oxygen Delivery Method: Circle system utilized Preoxygenation: Pre-oxygenation with 100% oxygen Induction Type: IV induction Ventilation: Mask ventilation without difficulty Laryngoscope Size: Mac and 4 Grade View: Grade II Tube type: Oral Tube size: 7.5 mm Number of attempts: 1 Airway Equipment and Method: Stylet Placement Confirmation: ETT inserted through vocal cords under direct vision,  positive ETCO2 and breath sounds checked- equal and bilateral Secured at: 22 cm Tube secured with: Tape Dental Injury: Teeth and Oropharynx as per pre-operative assessment

## 2018-08-23 NOTE — Progress Notes (Signed)
Pharmacy Antibiotic Note  Phillip Blackwell is a 58 y.o. male admitted on 08/13/2018 with sepsis. Patient with blood cultures growing rare organism: EGGERTHELLA LENTA. Dr. Sloan Leiter discussed the case with ID today and the plan is to narrow therapy to Unasyn since pt is improving with the plan to transition to PO Augmentin after and extended course.   He is on D11 of abx now. S/p Laparoscopic descending colostomy and Second Stage Penoscrotal Wound Debridement   Crcl>>65 ml/min  Plan:  Unasyn 3g IV q6   Height: 6' (182.9 cm) Weight: 190 lb (86.2 kg) IBW/kg (Calculated) : 77.6  Temp (24hrs), Avg:99 F (37.2 C), Min:97.5 F (36.4 C), Max:100.9 F (38.3 C)  Recent Labs  Lab 08/18/18 0359 08/19/18 0339 08/19/18 0737 08/20/18 0522 08/21/18 0318 08/22/18 0409 08/23/18 0407  WBC 14.9* 12.0*  --  11.5* 10.7* 9.8 9.1  CREATININE 2.01*  --  1.55* 1.43* 1.49*  --  1.33*    Estimated Creatinine Clearance: 65.6 mL/min (A) (by C-G formula based on SCr of 1.33 mg/dL (H)).    No Known Allergies  Antimicrobials this admission: Vanc 01/03 >> 1/6 Cefepime 01/03 >>1/10 Flagyl 1/3>>1/10 Unasyn 1/10>>  Microbiology Results: 1/3 BCx: EGGERTHELLA LENTA 1/3 UCx: NGF 1/8 Penoscrotal cx>>Mixed  1/5 MRSA PCR: negative   Onnie Boer, PharmD, BCIDP, AAHIVP, CPP Infectious Disease Pharmacist 08/23/2018 1:49 PM

## 2018-08-23 NOTE — Progress Notes (Signed)
RN verified the presence of a signed informed consent that matches stated procedure by patient. Verified armband matches patient's stated name and birth date. Verified NPO status and that all jewelry, contact, glasses, dentures, and partials had been removed (if applicable).   Unasyn q6 hour order got discontinued when I received dose due at 0900. Dr. Tresa Moore notified of same and states he will reorder.

## 2018-08-23 NOTE — Anesthesia Preprocedure Evaluation (Addendum)
Anesthesia Evaluation  Patient identified by MRN, date of birth, ID band Patient awake    Reviewed: Allergy & Precautions, NPO status , Patient's Chart, lab work & pertinent test results  History of Anesthesia Complications Negative for: history of anesthetic complications  Airway Mallampati: III  TM Distance: >3 FB Neck ROM: Full    Dental  (+) Dental Advisory Given, Teeth Intact   Pulmonary sleep apnea (no longer uses CPAP) ,    breath sounds clear to auscultation       Cardiovascular hypertension, Pt. on medications and Pt. on home beta blockers  Rhythm:Regular Rate:Tachycardia     Neuro/Psych PSYCHIATRIC DISORDERS Anxiety Depression CVA, No Residual Symptoms    GI/Hepatic Neg liver ROS, GERD  Medicated,  Endo/Other  negative endocrine ROS  Renal/GU Renal InsufficiencyRenal disease     Musculoskeletal negative musculoskeletal ROS (+)   Abdominal   Peds  Hematology  (+) anemia ,  Thrombocytosis    Anesthesia Other Findings   Reproductive/Obstetrics                            Anesthesia Physical Anesthesia Plan  ASA: III  Anesthesia Plan: General   Post-op Pain Management:    Induction: Intravenous  PONV Risk Score and Plan: 4 or greater and Treatment may vary due to age or medical condition, Ondansetron, Scopolamine patch - Pre-op, Midazolam and Dexamethasone  Airway Management Planned: Oral ETT  Additional Equipment: None  Intra-op Plan:   Post-operative Plan: Extubation in OR  Informed Consent: I have reviewed the patients History and Physical, chart, labs and discussed the procedure including the risks, benefits and alternatives for the proposed anesthesia with the patient or authorized representative who has indicated his/her understanding and acceptance.   Dental advisory given  Plan Discussed with: CRNA and Anesthesiologist  Anesthesia Plan Comments:         Anesthesia Quick Evaluation

## 2018-08-23 NOTE — Brief Op Note (Signed)
08/13/2018 - 08/23/2018  11:42 AM  PATIENT:  Phillip Blackwell  58 y.o. male  PRE-OPERATIVE DIAGNOSIS:  chronic rectal ulcer with pelvic sepsis, severe periscrotal infection  POST-OPERATIVE DIAGNOSIS:  chronic rectal ulcer with pelvic sepsis, severe periscrotal infection  PROCEDURE:   Second Stage Penoscrotal Wound Debridement  SURGEON:  Surgeon(s) and Role:     Alexis Frock, MD - Primary  PHYSICIAN ASSISTANT:   ASSISTANTS: none   ANESTHESIA:   general  EBL:  25 mL   BLOOD ADMINISTERED:none  DRAINS: Penrose drain x 3 to wound drainage   LOCAL MEDICATIONS USED:  NONE  SPECIMEN:  No Specimen  DISPOSITION OF SPECIMEN:  N/A  COUNTS:  YES  TOURNIQUET:  * No tourniquets in log *  DICTATION: .Other Dictation: Dictation Number [id number]  PLAN OF CARE: Admit to inpatient   PATIENT DISPOSITION:  PACU - hemodynamically stable.   Delay start of Pharmacological VTE agent (>24hrs) due to surgical blood loss or risk of bleeding: yes

## 2018-08-23 NOTE — Progress Notes (Signed)
Pt has refused cpap at this time. RT dc'd order at this time. RT will monitor.

## 2018-08-24 ENCOUNTER — Encounter (HOSPITAL_COMMUNITY): Payer: Self-pay | Admitting: Surgery

## 2018-08-24 DIAGNOSIS — N451 Epididymitis: Secondary | ICD-10-CM

## 2018-08-24 DIAGNOSIS — N179 Acute kidney failure, unspecified: Secondary | ICD-10-CM

## 2018-08-24 DIAGNOSIS — R17 Unspecified jaundice: Secondary | ICD-10-CM

## 2018-08-24 DIAGNOSIS — A419 Sepsis, unspecified organism: Principal | ICD-10-CM

## 2018-08-24 DIAGNOSIS — R6520 Severe sepsis without septic shock: Secondary | ICD-10-CM

## 2018-08-24 LAB — BASIC METABOLIC PANEL
Anion gap: 6 (ref 5–15)
BUN: 10 mg/dL (ref 6–20)
CO2: 24 mmol/L (ref 22–32)
Calcium: 8.1 mg/dL — ABNORMAL LOW (ref 8.9–10.3)
Chloride: 107 mmol/L (ref 98–111)
Creatinine, Ser: 1.25 mg/dL — ABNORMAL HIGH (ref 0.61–1.24)
GFR calc Af Amer: >60 mL/min (ref >60)
GFR calc non Af Amer: >60 mL/min (ref >60)
Glucose, Bld: 138 mg/dL — ABNORMAL HIGH (ref 70–99)
Potassium: 4.3 mmol/L (ref 3.5–5.1)
Sodium: 137 mmol/L (ref 135–145)

## 2018-08-24 LAB — CBC
HCT: 26.5 % — ABNORMAL LOW (ref 39.0–52.0)
Hemoglobin: 8.4 g/dL — ABNORMAL LOW (ref 13.0–17.0)
MCH: 28.8 pg (ref 26.0–34.0)
MCHC: 31.7 g/dL (ref 30.0–36.0)
MCV: 90.8 fL (ref 80.0–100.0)
Platelets: 601 10*3/uL — ABNORMAL HIGH (ref 150–400)
RBC: 2.92 MIL/uL — ABNORMAL LOW (ref 4.22–5.81)
RDW: 15 % (ref 11.5–15.5)
WBC: 8.8 10*3/uL (ref 4.0–10.5)
nRBC: 0 % (ref 0.0–0.2)

## 2018-08-24 MED ORDER — PRO-STAT SUGAR FREE PO LIQD
30.0000 mL | Freq: Two times a day (BID) | ORAL | Status: DC
  Administered 2018-08-24 – 2018-09-03 (×19): 30 mL via ORAL
  Filled 2018-08-24 (×20): qty 30

## 2018-08-24 MED ORDER — BOOST / RESOURCE BREEZE PO LIQD CUSTOM
1.0000 | Freq: Two times a day (BID) | ORAL | Status: DC
  Administered 2018-08-24 – 2018-09-03 (×14): 1 via ORAL
  Filled 2018-08-24: qty 1

## 2018-08-24 MED ORDER — OXYCODONE HCL 5 MG PO TABS
5.0000 mg | ORAL_TABLET | ORAL | Status: AC | PRN
  Administered 2018-08-25 (×3): 10 mg via ORAL
  Filled 2018-08-24: qty 1
  Filled 2018-08-24 (×3): qty 2

## 2018-08-24 MED ORDER — METOPROLOL TARTRATE 100 MG PO TABS
100.0000 mg | ORAL_TABLET | Freq: Two times a day (BID) | ORAL | Status: DC
  Administered 2018-08-24 – 2018-09-03 (×20): 100 mg via ORAL
  Filled 2018-08-24 (×21): qty 1

## 2018-08-24 NOTE — Consult Note (Signed)
[place] Nurse ostomy consult note Stoma type/location: LLQ, end colostomy Stomal assessment/size: 1" x 1 1/4" oval shaped, pink, moist, flush Peristomal assessment: intact  Treatment options for stomal/peristomal skin: 2" barrier ring to aid seal for flush stoma Output green, output Ostomy pouching: 1pc.flex convex with 2" barrier  Education provided:  Explained role of ostomy nurse and creation of stoma  Explained stoma characteristics (budded, flush, color, texture, care) Demonstrated pouch change (cutting new skin barrier, measuring stoma, cleaning peristomal skin and stoma, use of barrier ring) Education on emptying   Enrolled patient in [place] program: Yes  Wife to be present for next visit on Thursday in the am Patient was able to open and close lock and roll closure after education Patient ask appropriate questions   [place] Nurse will follow along with you for continued support with ostomy teaching and care [place] MSN, [place], [place], [place], [place]

## 2018-08-24 NOTE — Progress Notes (Signed)
Subjective/Chief Complaint:   1 - Prostate Cancer, Likely Recurrent - s/p primary external beam radiation completed 03/2017 in [place]  for larger volume Gleason 6 disease. Initial PSA 5.5 and diagnosed 2017 by Dr. Alyson Ingles.  Recent PSA Course: 06/2017 - PSA 1.1 06/2018 - PSA 2.6   2 - Acute Renal Failure - Cr 7.2 / K 5.0 by ER labs 08/14/18 on eval sepsis. [place] and CT w/o hydro or distend bladder. Baseline Cr 1.1 as recently as 07/2018.  3 - Sepsis of GI/GU Origin with Prostatitis / Epididymitis / Colitis / Severe Penoscrotal Infection - lactate 3.8, WBC 42k by ET labs 08/14/18 on eval fevers and malaise. UA unremarkable, UCX negative. [place] 1/3 Egertella Scrotal US and Pelvic CT with inflmmation of epidydimis / prostate / peri-rectal gas c/w likely infection stemming from colon microperf seeding prostate and GU tract. Initially had good systemic response to IV ABX but worsened penoscrotal skin swelling and then some purlulent drainage 1/7, Repeat CT 1/7 with some progression of peri-rectal gas and new displacement of prior fiducial marker (confirming hole in rectum) and still no large fluid collections / sub-Q gas and exam with new draining sinus tracts at penoscrotal junction. 1st stage OR penoscrotal wound debridment 1/8, 3 large penrose drains place, approx 5cm2 non-viable skin removed. New Wound CX 1/8 - skin flora. +MRSA screen. Repeat debridement 1/13 and additional 4cm2 penile shaft tissue removed, drains replaced, end colostomy by Dr. Gershon Crane.   4 - Rectal Ulcer/Suspect Perforation - known DX rectal ulcer with periodic bleeding following prostate radiation 2018. Flex-Sig / BX 06/2018 of ulcer edges negative for malignancy. CT 08/14/18 with gas in peri-rectal / peri-prostatic space c/w likely microperforation. Lap end colostomy performed by gen surg 1/13.   Today " Phillip Blackwell" is stable. GFR and Hgb remain good. Still some wound drainage. Not much colostomy output yet.    Objective: Vital signs in  last 24 hours: Temp:  [97.5 F (36.4 C)-98.6 F (37 C)] 98.6 F (37 C) (01/13 2110) Pulse Rate:  [81-111] 87 (01/14 1058) Resp:  [10-27] 18 (01/13 2110) BP: (133-183)/(83-101) 149/83 (01/14 1058) SpO2:  [95 %-100 %] 97 % (01/13 2110) Last BM Date: 08/24/17  Intake/Output from previous day: 01/13 0701 - 01/14 0700 In: 1292.5 [I.V.:1192.5; IV Piggyback:100] Out: 450 [Urine:425; Blood:25] Intake/Output this shift: Total I/O In: -  Out: 250 [Urine:250]   EXAM: NAD in pre-op holding, family at bedside.  Head: Normocephalic.  Eyes: Pupils are equal, round, and reactive to light.  Neck: Normal range of motion.  Cardiovascular: regular rate.  Respiratory:  Non-labored on room air GI: Soft. There is no abdominal tenderness. There is no rebound and no guarding.  Genitourinary:    Genitourinary Comments: Recent surgical drians with improved but persistent purlulence and local swelling of penis, scrotum, perineum. Musculoskeletal: Normal range of motion.  Neurological: He is alert.  Skin: Skin is warm.  Psychiatric: He has a normal mood and affect. His behavior is normal.  Lab Results:  Recent Labs    08/23/18 0407 08/24/18 0324  WBC 9.1 8.8  HGB 8.1* 8.4*  HCT 26.7* 26.5*  PLT 625* 601*   BMET Recent Labs    08/23/18 0407 08/24/18 0324  NA 136 137  K 3.9 4.3  CL 106 107  CO2 23 24  GLUCOSE 101* 138*  BUN 9 10  CREATININE 1.33* 1.25*  CALCIUM 7.9* 8.1*   PT/INR No results for input(s): LABPROT, INR in the last 72 hours. ABG No results  for input(s): PHART, HCO3 in the last 72 hours.  Invalid input(s): PCO2, PO2  Studies/Results: No results found.  Anti-infectives: Anti-infectives (From admission, onward)   Start     Dose/Rate Route Frequency Ordered Stop   08/23/18 1500  Ampicillin-Sulbactam (UNASYN) 3 g in sodium chloride 0.9 % 100 mL IVPB     3 g 200 mL/hr over 30 Minutes Intravenous Every 6 hours 08/23/18 1434     08/23/18 0845  Ampicillin-Sulbactam  (UNASYN) 3 g in sodium chloride 0.9 % 100 mL IVPB     3 g 200 mL/hr over 30 Minutes Intravenous To ShortStay Surgical 08/23/18 0839 08/23/18 1018   08/20/18 2200  ceFEPIme (MAXIPIME) 2 g in sodium chloride 0.9 % 100 mL IVPB  Status:  Discontinued     2 g 200 mL/hr over 30 Minutes Intravenous Every 12 hours 08/20/18 1315 08/20/18 1406   08/20/18 1500  Ampicillin-Sulbactam (UNASYN) 3 g in sodium chloride 0.9 % 100 mL IVPB  Status:  Discontinued     3 g 200 mL/hr over 30 Minutes Intravenous Every 6 hours 08/20/18 1420 08/23/18 0839   08/17/18 1400  metroNIDAZOLE (FLAGYL) tablet 500 mg  Status:  Discontinued     500 mg Oral Every 8 hours 08/17/18 1303 08/20/18 1406   08/16/18 2000  ceFEPIme (MAXIPIME) 2 g in sodium chloride 0.9 % 100 mL IVPB  Status:  Discontinued     2 g 200 mL/hr over 30 Minutes Intravenous Every 24 hours 08/16/18 1328 08/20/18 1315   08/15/18 1030  vancomycin (VANCOCIN) 1,250 mg in sodium chloride 0.9 % 250 mL IVPB     1,250 mg 166.7 mL/hr over 90 Minutes Intravenous  Once 08/15/18 0942 08/15/18 1444   08/14/18 2000  ceFEPIme (MAXIPIME) 1 g in sodium chloride 0.9 % 100 mL IVPB  Status:  Discontinued     1 g 200 mL/hr over 30 Minutes Intravenous Every 24 hours 08/13/18 2052 08/16/18 1328   08/13/18 2052  vancomycin variable dose per unstable renal function (pharmacist dosing)  Status:  Discontinued      Does not apply See admin instructions 08/13/18 2052 08/16/18 1442   08/13/18 2045  ceFEPIme (MAXIPIME) 2 g in sodium chloride 0.9 % 100 mL IVPB     2 g 200 mL/hr over 30 Minutes Intravenous  Once 08/13/18 2038 08/13/18 2141   08/13/18 2045  metroNIDAZOLE (FLAGYL) IVPB 500 mg  Status:  Discontinued     500 mg 100 mL/hr over 60 Minutes Intravenous Every 8 hours 08/13/18 2038 08/17/18 1303   08/13/18 2045  vancomycin (VANCOCIN) IVPB 1000 mg/200 mL premix     1,000 mg 200 mL/hr over 60 Minutes Intravenous  Once 08/13/18 2038 08/13/18 2333      Assessment/Plan:  1 -  Prostate Cancer, Likely Recurrent - PSA pattern most c/w active disease, but no signs of metastatic involvement. May warrant tissue sampling via transperineal approach in elective setting. No further diagnostics or intervention this admission.   2 - Acute Renal Failure - Resolved,  likely pre-renal due to sepsis. No hydro to warrant stenting / neph tubes. Rec continued foley for accurate UOP monitoring.  3 - Sepsis of GI/GU Origin with Prostatitis / Epididymitis / Colitis / Severe Penoscrotal Infection - Improving sytemically and locally on IV ABX and s/p staged local wound debridement and end colostomy.   Will likely need another debridment at least before transitioning wound towards closure / grafitng. Will consult plastics once clears infectious paramaters of wound.  4 -  Rectal Ulcer/Suspect Perforation - likely from progression of known radiation-induced rectal ulcer. Now s/p fecal diversion which should drastically (but likely not completely) reduce output across perforation.   Alexis Frock 08/24/2018

## 2018-08-24 NOTE — Progress Notes (Addendum)
Physical Therapy Treatment Patient Details Name: Phillip Blackwell MRN: [id number] DOB: 07-15-1961 Today's Date: 08/24/2018    History of Present Illness Pt is a 58 y.o. male with medical history significant of anxiety, depression, GERD, hypertension, CVA, OSA presenting to the hospital for evaluation of groin pain and swelling , fever, blood in stool, work-up was significant for sepsis, acute blood loss anemia, is admitted for further work-up, this is secondary to colitis/prostatitis/epididymitis from perforated rectal ulcer    PT Comments    Pt performed progression to mobility and gait training.  He remains limited due to pain in groin and rectum.  Plan for intensive therapy at [facility] remains appropriate.  Pt is highly motivated.  Will continue PT per POC with emphasis on improving functional mobility and strength.     Follow Up Recommendations  [facility];Supervision/Assistance - 24 hour     Equipment Recommendations  None recommended by PT    Recommendations for Other Services       Precautions / Restrictions Precautions Precautions: Fall Restrictions Weight Bearing Restrictions: No    Mobility  Bed Mobility Overal bed mobility: Needs Assistance Bed Mobility: Supine to Sit     Supine to sit: Mod assist;+2 for physical assistance     General bed mobility comments: Pt performed modified rolling and complete transfer (helicopter technique) to bring legs around to edge of bed.  Pt only able to ground R foot and started to stand due to scrotal edema and pain.    Transfers Overall transfer level: Needs assistance Equipment used: Rolling walker (2 wheeled) Transfers: Sit to/from Stand Sit to Stand: Mod assist;+2 safety/equipment         General transfer comment: Cues for hand placement to push from seated surface.  Pt standing into wide base due to scrotal edema and required cues to decrease BOS in standing to progress to ambulation.    Ambulation/Gait Ambulation/Gait  assistance: Min assist ( +2 for chair follow ) Gait Distance (Feet): 40 Feet Assistive device: Rolling walker (2 wheeled) Gait Pattern/deviations: Step-through pattern;Wide base of support;Shuffle;Decreased stride length;Decreased step length - right;Decreased step length - left     General Gait Details: Cues for RW safety, cues for stepping closer to device.  Pt with slow waddling pace.     Stairs             Wheelchair Mobility    Modified Rankin (Stroke Patients Only)       Balance Overall balance assessment: Needs assistance   Sitting balance-Leahy Scale: Poor       Standing balance-Leahy Scale: Poor                              Cognition Arousal/Alertness: Awake/alert Behavior During Therapy: WFL for tasks assessed/performed Overall Cognitive Status: Within Functional Limits for tasks assessed                                        Exercises      General Comments        Pertinent Vitals/Pain Pain Assessment: Faces Faces Pain Scale: Hurts even more Pain Location: scrotal and rectal pain, denies stomach pain.   Pain Descriptors / Indicators: Aching Pain Intervention(s): Monitored during session;Repositioned    Home Living  Prior Function            PT Goals (current goals can now be found in the care plan section) Acute Rehab PT Goals Patient Stated Goal: to get stronger Potential to Achieve Goals: Good Progress towards PT goals: Progressing toward goals    Frequency    Min 3X/week      PT Plan Current plan remains appropriate    Co-evaluation              AM-PAC PT "6 Clicks" Mobility   Outcome Measure  Help needed turning from your back to your side while in a flat bed without using bedrails?: Total Help needed moving from lying on your back to sitting on the side of a flat bed without using bedrails?: Total Help needed moving to and from a bed to a chair (including  a wheelchair)?: Total Help needed standing up from a chair using your arms (e.g., wheelchair or bedside chair)?: Total Help needed to walk in hospital room?: Total Help needed climbing 3-5 steps with a railing? : Total 6 Click Score: 6    End of Session Equipment Utilized During Treatment: Gait belt Activity Tolerance: Patient limited by fatigue;Patient limited by lethargy Patient left: in chair;with call bell/phone within reach;with chair alarm set(informed patient and nurse to sit x 1 hour.  ) Nurse Communication: Mobility status PT Visit Diagnosis: Muscle weakness (generalized) (M62.81);Difficulty in walking, not elsewhere classified (R26.2)     Time: 0093-8182 PT Time Calculation (min) (ACUTE ONLY): 25 min  Charges:  $Gait Training: 8-22 mins $Therapeutic Activity: 8-22 mins                     [name], PTA Acute Rehabilitation Services Pager [phone] Office [phone]     [name] 08/24/2018, 4:56 PM

## 2018-08-24 NOTE — Progress Notes (Signed)
Nutrition Follow-up  DOCUMENTATION CODES:   Non-severe (moderate) malnutrition in context of chronic illness  INTERVENTION:   - Continue MVI with minerals daily  - Boost Breeze po BID, each supplement provides 250 kcal and 9 grams of protein  - Prostat 30 ml BID, each supplement provides 100 kcals and 15 grams protein  - Encourage adequate PO intake with a focus on protein-rich foods  - d/c Ensure Enlive due to pt preference  NUTRITION DIAGNOSIS:   Moderate Malnutrition related to chronic illness (recurrent prostate cancer s/p radiation, chronic rectal ulcer/perforation) as evidenced by mild fat depletion, mild muscle depletion, percent weight loss (11.1% weight loss in less than 2 months).  Ongoing  GOAL:   Patient will meet greater than or equal to 90% of their needs  Progressing  MONITOR:   PO intake, Supplement acceptance, I & O's, Weight trends, Labs  REASON FOR ASSESSMENT:   Malnutrition Screening Tool    ASSESSMENT:   58 year old male who presented to the ED on 1/3 with groin swelling, N/V, and diarrhea. PMH significant for anxiety, depression, CVA, GERD, recurrent prostate cancer s/p radiation in 2018, chronic rectal ulcer/perforation,  HTN. Pt admitted with Code Sepsis secondary to acute epididymitis and AKI.  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 Pt had just received bath and states pain well-controlled with medication.  Pt reports that he is eating "some." Pt states that he ate eggs and some sausage for breakfast this morning. Pt reports that he was eating softer foods since he just had surgery yesterday. RD encouraged adequate PO intake with a focus on protein to aid in wound healing. Pt expresses understanding.  Pt states that Ensure Enlive "messes with my stomach because  it's like milk." Pt is willing to try Boost Breeze instead. Pt also willing to try Pro-stat to increase protein intake.  Meal Completion: 100% x last 2 recorded meals (last meals recorded on 1/10)  Medications reviewed and include: vitamin D-3, Ensure Enlive BID, ferrous sulfate daily, MVI with minerals, Protonix, Fibercon, IV Unasyn  Labs reviewed.  Diet Order:   Diet Order            Diet regular Room service appropriate? Yes; Fluid consistency: Thin  Diet effective now              EDUCATION NEEDS:   Education needs have been addressed  Skin:  Skin Assessment: Skin Integrity Issues: Incisions: perineum, scrotum, abdomen  Last BM:  1/12  Height:   Ht Readings from Last 1 Encounters:  08/13/18 6' (1.829 m)    Weight:   Wt Readings from Last 1 Encounters:  08/13/18 86.2 kg    Ideal Body Weight:  80.9 kg  BMI:  Body mass index is 25.77 kg/m.  Estimated Nutritional Needs:   Kcal:  2100-2300  Protein:  100-115 grams  Fluid:  >/= 2.1 L    Gaynell Face, MS, RD, LDN Inpatient Clinical Dietitian Pager: [phone] Weekend/After Hours: [phone]

## 2018-08-24 NOTE — Progress Notes (Signed)
Inpatient Rehabilitation Admissions Coordinator  Discussed at [facility] with RN CM and SW. They are pursuing SNF rehab venue with coverage by the [place]. We will sign off at this time.  Danne Baxter, RN, MSN Rehab Admissions Coordinator [phone] 08/24/2018 11:19 AM

## 2018-08-24 NOTE — Progress Notes (Signed)
[facility] Progress Note  1 Day Post-Op  Subjective: CC: abdominal pain Mild soreness in abdomen, improved with pain medication. Denies nausea. Tolerating diet and having some stool output from stoma already.   Objective: Vital signs in last 24 hours: Temp:  [97.5 F (36.4 C)-98.6 F (37 C)] 98.6 F (37 C) (01/13 2110) Pulse Rate:  [81-111] 91 (01/13 2110) Resp:  [10-27] 18 (01/13 2110) BP: (133-183)/(84-101) 147/87 (01/13 2110) SpO2:  [95 %-100 %] 97 % (01/13 2110) Last BM Date: 08/22/18  Intake/Output from previous day: 01/13 0701 - 01/14 0700 In: 1292.5 [I.V.:1192.5; IV Piggyback:100] Out: 450 [Urine:425; Blood:25] Intake/Output this shift: No intake/output data recorded.  PE: Gen:  Alert, NAD, pleasant Card:  Regular rate and rhythm, pedal pulses 2+ BL Pulm:  Normal effort, clear to auscultation bilaterally Abd: Soft, appropriately TTP, mildly distended, +BS, stoma with stool in ostomy, laparoscopic dressings c/d/i Skin: warm and dry, no rashes  Psych: A&Ox3   Lab Results:  Recent Labs    08/23/18 0407 08/24/18 0324  WBC 9.1 8.8  HGB 8.1* 8.4*  HCT 26.7* 26.5*  PLT 625* 601*   BMET Recent Labs    08/23/18 0407 08/24/18 0324  NA 136 137  K 3.9 4.3  CL 106 107  CO2 23 24  GLUCOSE 101* 138*  BUN 9 10  CREATININE 1.33* 1.25*  CALCIUM 7.9* 8.1*   PT/INR No results for input(s): LABPROT, INR in the last 72 hours. CMP     Component Value Date/Time   NA 137 08/24/2018 0324   K 4.3 08/24/2018 0324   CL 107 08/24/2018 0324   CO2 24 08/24/2018 0324   GLUCOSE 138 (H) 08/24/2018 0324   BUN 10 08/24/2018 0324   CREATININE 1.25 (H) 08/24/2018 0324   CALCIUM 8.1 (L) 08/24/2018 0324   PROT 6.2 (L) 08/19/2018 0339   ALBUMIN 1.2 (L) 08/19/2018 0339   AST 42 (H) 08/19/2018 0339   ALT 31 08/19/2018 0339   ALKPHOS 66 08/19/2018 0339   BILITOT 1.6 (H) 08/19/2018 0339   GFRNONAA >60 08/24/2018 0324   GFRAA >60 08/24/2018 0324   Lipase      Component Value Date/Time   LIPASE 33 08/13/2018 2047       Studies/Results: No results found.  Anti-infectives: Anti-infectives (From admission, onward)   Start     Dose/Rate Route Frequency Ordered Stop   08/23/18 1500  Ampicillin-Sulbactam (UNASYN) 3 g in sodium chloride 0.9 % 100 mL IVPB     3 g 200 mL/hr over 30 Minutes Intravenous Every 6 hours 08/23/18 1434     08/23/18 0845  Ampicillin-Sulbactam (UNASYN) 3 g in sodium chloride 0.9 % 100 mL IVPB     3 g 200 mL/hr over 30 Minutes Intravenous To [facility]Stay [facility] 08/23/18 0839 08/23/18 1018   08/20/18 2200  ceFEPIme (MAXIPIME) 2 g in sodium chloride 0.9 % 100 mL IVPB  Status:  Discontinued     2 g 200 mL/hr over 30 Minutes Intravenous Every 12 hours 08/20/18 1315 08/20/18 1406   08/20/18 1500  Ampicillin-Sulbactam (UNASYN) 3 g in sodium chloride 0.9 % 100 mL IVPB  Status:  Discontinued     3 g 200 mL/hr over 30 Minutes Intravenous Every 6 hours 08/20/18 1420 08/23/18 0839   08/17/18 1400  metroNIDAZOLE (FLAGYL) tablet 500 mg  Status:  Discontinued     500 mg Oral Every 8 hours 08/17/18 1303 08/20/18 1406   08/16/18 2000  ceFEPIme (MAXIPIME) 2 g in sodium chloride  0.9 % 100 mL IVPB  Status:  Discontinued     2 g 200 mL/hr over 30 Minutes Intravenous Every 24 hours 08/16/18 1328 08/20/18 1315   08/15/18 1030  vancomycin (VANCOCIN) 1,250 mg in sodium chloride 0.9 % 250 mL IVPB     1,250 mg 166.7 mL/hr over 90 Minutes Intravenous  Once 08/15/18 0942 08/15/18 1444   08/14/18 2000  ceFEPIme (MAXIPIME) 1 g in sodium chloride 0.9 % 100 mL IVPB  Status:  Discontinued     1 g 200 mL/hr over 30 Minutes Intravenous Every 24 hours 08/13/18 2052 08/16/18 1328   08/13/18 2052  vancomycin variable dose per unstable renal function (pharmacist dosing)  Status:  Discontinued      Does not apply See admin instructions 08/13/18 2052 08/16/18 1442   08/13/18 2045  ceFEPIme (MAXIPIME) 2 g in sodium chloride 0.9 % 100 mL IVPB     2 g 200  mL/hr over 30 Minutes Intravenous  Once 08/13/18 2038 08/13/18 2141   08/13/18 2045  metroNIDAZOLE (FLAGYL) IVPB 500 mg  Status:  Discontinued     500 mg 100 mL/hr over 60 Minutes Intravenous Every 8 hours 08/13/18 2038 08/17/18 1303   08/13/18 2045  vancomycin (VANCOCIN) IVPB 1000 mg/200 mL premix     1,000 mg 200 mL/hr over 60 Minutes Intravenous  Once 08/13/18 2038 08/13/18 2333       Assessment/Plan Hx of prostate CA, recurrent,with external beam radiation Gleason 6 OSA - refusing CPAP BPH Hiccups x 1 month AKI - creatinine 1.25, trending down Anemia - chronic disease, h/h 8.4/26.5 today  Full thickness perforation of rectal ulcer Severe sepsis - prostatitis/epididymitis/colitis/severe penoscrotal infection  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 Georgette Dover - POD#1 - WOC to see - [facility] RN for continued stoma teaching ordered - follow up in chart S/p penoscrotal wound debridement 08/23/18 Dr. Tresa Moore - wound care per urology  ID -Maxipime 1/3> 1/10,Flagyl 1/3>1/10; Loralyn [place] 1/10>> FEN -Reg diet VTE -SCDs, ok for chemical VTE from a general surgery standpoint Foley -in place  LOS: 10 days    Brigid Re , [facility] 08/24/2018, 9:16 AM Pager: [phone] Consults: [phone] Mon-Fri 7:00 am-4:30 pm Sat-Sun 7:00 am-11:30 am

## 2018-08-24 NOTE — Progress Notes (Signed)
PROGRESS NOTE        PATIENT DETAILS Name: SANCHEZ HEMMER Age: 58 y.o. Sex: male Date of Birth: 10/02/1960 Admit Date: 08/13/2018 Admitting Physician Shela Leff, MD JAS:[id number], Pcp Not In  Brief Narrative: Patient is a 58 y.o. male with history of hypertension, BPH, OSA prostate cancer status post external beam radiation-presented with sepsis secondary to rectal ulcer with perforation with seeding of the GU tract causing epididymitis/prostatitis.  Further hospital course complicated by worsening penile/scrotal infection and AKI.  See below for further details  Subjective: No chest pain or Shortness of breath. Lying comfortably in bed-some minimal stools in the ostomy  Assessment/Plan: Severe sepsis secondary to perforated rectal ulcer with prostatitis/epididymitis and penoscrotal abscess along with eggerthella lenta bacteremia: Sepsis pathophysiology has resolved-right.  Underwent diverting colostomy and a repeat scrotal debridement on 1/13.  Had undergone initial I&D of his scrotal area on 1/8. This MD spoke with Dr. Michel Bickers on 1/10-recommendations were for 3 weeks of antimicrobial therapy-currently on Unasyn-when more stable we will transition to Augmentin.  AKI: Likely hemodynamically mediated-from ATN in the setting of sepsis.  Renal function has markedly improved-creatinine has now normalized.  Multifactorial anemia secondary to acute blood loss anemia and secondary to acute/critical illness: Hemoglobin stable following transfusion of 1 unit of PRBC-follow CBC periodically.  Hyperbilirubinemia: Appears to be mostly direct hyperbilirubinemia-no biliary dilatation seen on CT scan.  Bilirubin has normalized-not sure if this was all secondary to sepsis.  Follow periodically.    Hypertension: Uncontrolled-increase metoprolol 200 mg twice daily, continue prazosin-S blood pressure is still an issue we will add amlodipine.    Dyslipidemia: Continue  statin  BPH: Continue Flomax  OSA: CPAP nightly-but per RT note-patient has been refusing lately-we will counsel  Moderate protein calorie malnutrition: Continue supplements  Acute debility/deconditioning: Likely secondary to acute illness-social worker consulted for SNF placement on discharge-hopefully in a few days.  DVT Prophylaxis: SCD's  Code Status: Full code  Family Communication: Spouse at bedside  Disposition Plan: Remain inpatient-requires several more days of hospitalization before consideration of discharge  Antimicrobial agents: Anti-infectives (From admission, onward)   Start     Dose/Rate Route Frequency Ordered Stop   08/23/18 1500  Ampicillin-Sulbactam (UNASYN) 3 g in sodium chloride 0.9 % 100 mL IVPB     3 g 200 mL/hr over 30 Minutes Intravenous Every 6 hours 08/23/18 1434     08/23/18 0845  Ampicillin-Sulbactam (UNASYN) 3 g in sodium chloride 0.9 % 100 mL IVPB     3 g 200 mL/hr over 30 Minutes Intravenous To [facility]Stay [facility] 08/23/18 0839 08/23/18 1018   08/20/18 2200  ceFEPIme (MAXIPIME) 2 g in sodium chloride 0.9 % 100 mL IVPB  Status:  Discontinued     2 g 200 mL/hr over 30 Minutes Intravenous Every 12 hours 08/20/18 1315 08/20/18 1406   08/20/18 1500  Ampicillin-Sulbactam (UNASYN) 3 g in sodium chloride 0.9 % 100 mL IVPB  Status:  Discontinued     3 g 200 mL/hr over 30 Minutes Intravenous Every 6 hours 08/20/18 1420 08/23/18 0839   08/17/18 1400  metroNIDAZOLE (FLAGYL) tablet 500 mg  Status:  Discontinued     500 mg Oral Every 8 hours 08/17/18 1303 08/20/18 1406   08/16/18 2000  ceFEPIme (MAXIPIME) 2 g in sodium chloride 0.9 % 100 mL IVPB  Status:  Discontinued  2 g 200 mL/hr over 30 Minutes Intravenous Every 24 hours 08/16/18 1328 08/20/18 1315   08/15/18 1030  vancomycin (VANCOCIN) 1,250 mg in sodium chloride 0.9 % 250 mL IVPB     1,250 mg 166.7 mL/hr over 90 Minutes Intravenous  Once 08/15/18 0942 08/15/18 1444   08/14/18 2000  ceFEPIme  (MAXIPIME) 1 g in sodium chloride 0.9 % 100 mL IVPB  Status:  Discontinued     1 g 200 mL/hr over 30 Minutes Intravenous Every 24 hours 08/13/18 2052 08/16/18 1328   08/13/18 2052  vancomycin variable dose per unstable renal function (pharmacist dosing)  Status:  Discontinued      Does not apply See admin instructions 08/13/18 2052 08/16/18 1442   08/13/18 2045  ceFEPIme (MAXIPIME) 2 g in sodium chloride 0.9 % 100 mL IVPB     2 g 200 mL/hr over 30 Minutes Intravenous  Once 08/13/18 2038 08/13/18 2141   08/13/18 2045  metroNIDAZOLE (FLAGYL) IVPB 500 mg  Status:  Discontinued     500 mg 100 mL/hr over 60 Minutes Intravenous Every 8 hours 08/13/18 2038 08/17/18 1303   08/13/18 2045  vancomycin (VANCOCIN) IVPB 1000 mg/200 mL premix     1,000 mg 200 mL/hr over 60 Minutes Intravenous  Once 08/13/18 2038 08/13/18 2333      Procedures: None  CONSULTS:  general surgery and urology  Time spent: 25 minutes-Greater than 50% of this time was spent in counseling, explanation of diagnosis, planning of further management, and coordination of care.  MEDICATIONS: Scheduled Meds: . sodium chloride   Intravenous Once  . atorvastatin  40 mg Oral Q supper  . cholecalciferol  2,000 Units Oral Daily  . feeding supplement  1 Container Oral BID BM  . feeding supplement (PRO-STAT SUGAR FREE 64)  30 mL Oral BID  . ferrous sulfate  325 mg Oral Q breakfast  . metoprolol tartrate  75 mg Oral BID  . multivitamin with minerals  1 tablet Oral Daily  . pantoprazole  40 mg Oral BID AC  . polycarbophil  625 mg Oral BID  . prazosin  2 mg Oral QHS   Continuous Infusions: . sodium chloride Stopped (08/17/18 0659)  . ampicillin-sulbactam (UNASYN) IV 3 g (08/24/18 0814)  . lactated ringers 10 mL/hr at 08/18/18 2212  . lactated ringers 10 mL/hr at 08/23/18 0851   PRN Meds:.sodium chloride, acetaminophen **OR** acetaminophen, clonazePAM, ondansetron (ZOFRAN) IV, tamsulosin   PHYSICAL EXAM: Vital  signs: Vitals:   08/23/18 1313 08/23/18 1333 08/23/18 2110 08/24/18 1058  BP:  (!) 172/91 (!) 147/87 (!) 149/83  Pulse: 89 100 91 87  Resp: (!) 27  18   Temp:  97.8 F (36.6 C) 98.6 F (37 C)   TempSrc:  Oral Oral   SpO2: 98% 97% 97%   Weight:      Height:       Filed Weights   08/13/18 1922 08/13/18 2234  Weight: 86.2 kg 86.2 kg   Body mass index is 25.77 kg/m.   General appearance:Awake, alert, not in any distress.  Eyes:no scleral icterus. HEENT: Atraumatic and Normocephalic Neck: supple, no JVD. Resp:Good air entry bilaterally,no rales or rhonchi CVS: S1 S2 regular, no murmurs.  GI: Bowel sounds present, Non tender and not distended with no gaurding, rigidity or rebound.  Some minimal dark stools in the ostomy bag Extremities: B/L Lower Ext shows no edema, both legs are warm to touch Neurology:  Non focal Psychiatric: Normal judgment and insight. Normal mood. Musculoskeletal:No  digital cyanosis Skin:No Rash, warm and dry Wounds:N/A  I have personally reviewed following labs and imaging studies  LABORATORY DATA: CBC: Recent Labs  Lab 08/20/18 0522 08/21/18 0318 08/22/18 0409 08/22/18 1454 08/23/18 0407 08/24/18 0324  WBC 11.5* 10.7* 9.8  --  9.1 8.8  HGB 7.2* 7.3* 6.9* 8.6* 8.1* 8.4*  HCT 23.1* 23.4* 22.2* 27.7* 26.7* 26.5*  MCV 89.2 89.7 90.2  --  90.5 90.8  PLT 560* 629* 594*  --  625* 601*    Basic Metabolic Panel: Recent Labs  Lab 08/19/18 0737 08/20/18 0522 08/21/18 0318 08/23/18 0407 08/24/18 0324  NA 134* 135 134* 136 137  K 4.4 3.9 4.0 3.9 4.3  CL 106 105 105 106 107  CO2 21* 21* 21* 23 24  GLUCOSE 172* 111* 90 101* 138*  BUN 38* 33* 24* 9 10  CREATININE 1.55* 1.43* 1.49* 1.33* 1.25*  CALCIUM 8.0* 7.8* 7.9* 7.9* 8.1*    GFR: Estimated Creatinine Clearance: 69.8 mL/min (A) (by C-G formula based on SCr of 1.25 mg/dL (H)).  Liver Function Tests: Recent Labs  Lab 08/19/18 0339  AST 42*  ALT 31  ALKPHOS 66  BILITOT 1.6*  PROT  6.2*  ALBUMIN 1.2*   No results for input(s): LIPASE, AMYLASE in the last 168 hours. No results for input(s): AMMONIA in the last 168 hours.  Coagulation Profile: No results for input(s): INR, PROTIME in the last 168 hours.  Cardiac Enzymes: No results for input(s): CKTOTAL, CKMB, CKMBINDEX, TROPONINI in the last 168 hours.  BNP (last 3 results) No results for input(s): PROBNP in the last 8760 hours.  HbA1C: No results for input(s): HGBA1C in the last 72 hours.  CBG: Recent Labs  Lab 08/18/18 2229 08/21/18 0958  GLUCAP 100* 118*    Lipid Profile: No results for input(s): CHOL, HDL, LDLCALC, TRIG, CHOLHDL, LDLDIRECT in the last 72 hours.  Thyroid Function Tests: No results for input(s): TSH, T4TOTAL, FREET4, T3FREE, THYROIDAB in the last 72 hours.  Anemia Panel: No results for input(s): VITAMINB12, FOLATE, FERRITIN, TIBC, IRON, RETICCTPCT in the last 72 hours.  Urine analysis:    Component Value Date/Time   COLORURINE AMBER (A) 08/13/2018 2010   APPEARANCEUR CLOUDY (A) 08/13/2018 2010   LABSPEC 1.014 08/13/2018 2010   PHURINE 5.0 08/13/2018 2010   GLUCOSEU NEGATIVE 08/13/2018 2010   HGBUR NEGATIVE 08/13/2018 2010   [place] NEGATIVE 08/13/2018 2010   [place] NEGATIVE 08/13/2018 2010   PROTEINUR 30 (A) 08/13/2018 2010   NITRITE NEGATIVE 08/13/2018 2010   LEUKOCYTESUR NEGATIVE 08/13/2018 2010    Sepsis Labs: Lactic Acid, Venous    Component Value Date/Time   LATICACIDVEN 1.95 (H) 08/13/2018 2313    MICROBIOLOGY: Recent Results (from the past 240 hour(s))  MRSA PCR Screening     Status: None   Collection Time: 08/15/18 12:09 AM  Result Value Ref Range Status   MRSA by PCR NEGATIVE NEGATIVE Final    Comment:        The GeneXpert MRSA Assay (FDA approved for NASAL specimens only), is one component of a comprehensive MRSA colonization surveillance program. It is not intended to diagnose MRSA infection nor to guide or monitor treatment for MRSA  infections. Performed at [facility], [place] [address], [place], [place] [address]   Surgical pcr screen     Status: Abnormal   Collection Time: 08/18/18 10:06 AM  Result Value Ref Range Status   MRSA, PCR NEGATIVE NEGATIVE Final   Staphylococcus aureus POSITIVE (A) NEGATIVE  Final    Comment: (NOTE) The Xpert SA Assay (FDA approved for NASAL specimens in patients 25 years of age and older), is one component of a comprehensive surveillance program. It is not intended to diagnose infection nor to guide or monitor treatment. Performed at [facility], [place] [address], [place], [place] [address]   Aerobic/Anaerobic Culture (surgical/deep wound)     Status: Abnormal   Collection Time: 08/18/18  6:14 PM  Result Value Ref Range Status   Specimen Description TISSUE  Final   Special Requests PENOSCROTAL  Final   Gram Stain   Final    RARE WBC PRESENT, PREDOMINANTLY PMN ABUNDANT GRAM POSITIVE COCCI ABUNDANT GRAM NEGATIVE RODS ABUNDANT GRAM POSITIVE RODS Performed at [facility], [address] [place], [place] [address]    Culture (A)  Final    MULTIPLE ORGANISMS PRESENT, NONE PREDOMINANT MIXED ANAEROBIC FLORA PRESENT.  CALL LAB IF FURTHER IID REQUIRED.    Report Status 08/23/2018 FINAL  Final  Aerobic/Anaerobic Culture (surgical/deep wound)     Status: None   Collection Time: 08/18/18  6:16 PM  Result Value Ref Range Status   Specimen Description ABSCESS  Final   Special Requests PENOSCROTAL SWABS  Final   Gram Stain   Final    NO WBC SEEN ABUNDANT GRAM POSITIVE COCCI MODERATE GRAM NEGATIVE RODS MODERATE GRAM POSITIVE RODS Performed at [facility], [place] [address], [place],  [address]    Culture   Final    NORMAL SKIN FLORA MIXED ANAEROBIC FLORA PRESENT.  CALL LAB IF FURTHER IID REQUIRED.    Report Status 08/23/2018 FINAL  Final    RADIOLOGY STUDIES/RESULTS: Ct Abdomen Pelvis Wo Contrast  Result Date: 08/13/2018 CLINICAL DATA:  Groin  swelling EXAM: CT ABDOMEN AND PELVIS WITHOUT CONTRAST TECHNIQUE: Multidetector CT imaging of the abdomen and pelvis was performed following the standard protocol without IV contrast. COMPARISON:  MRI 07/10/2018 FINDINGS: Lower chest: Lung bases demonstrate no acute consolidation or effusion. The heart size is normal. Small hiatal hernia. Hepatobiliary: No focal liver abnormality is seen. No gallstones, gallbladder wall thickening, or biliary dilatation. Pancreas: Unremarkable. No pancreatic ductal dilatation or surrounding inflammatory changes. Spleen: Normal in size without focal abnormality. Adrenals/Urinary Tract: Adrenal glands are within normal limits. No hydronephrosis. Thick-walled urinary bladder Stomach/Bowel: Stomach is nonenlarged. No dilated small bowel. Extensive gas collection Between the rectum and prostate. Vascular/Lymphatic: Mild aortic atherosclerosis. No aneurysm. No significantly enlarged lymph nodes. Reproductive: Metallic densities along the posterior aspect of the prostate. Large gas collections around the prostate gland. Moderate edema of the scrotum and around the penis. Other: No free air. Fat in the left inguinal canal. Edema within the pubic soft tissues. Gas and fluid collection extending from the rectal area and along both sides of the prostate gland. Musculoskeletal: No acute or suspicious abnormality. IMPRESSION: 1. Moderate edema and mild inflammatory changes involving the scrotum and soft tissues about the penis, but without soft tissue emphysema. Mild edema within the pubic soft tissues. 2. Abnormal gas and fluid collection between the rectum and prostate gland and extending along the right and left aspects of the prostate gland, felt to correspond to previously demonstrated rectal ulcer and sinus tracks. 3. Thick-walled appearance of the urinary bladder, possible cystitis Electronically Signed   By: Donavan Foil M.D.   On: 08/13/2018 23:19   Dg Chest 2 View  Result Date:  08/13/2018 CLINICAL DATA:  Acute onset of genital swelling. Fever. EXAM: CHEST - 2 VIEW  COMPARISON:  Chest radiograph performed 07/07/2018 FINDINGS: The lungs are well-aerated. Minimal left basilar atelectasis is noted. There is no evidence of pleural effusion or pneumothorax. The heart is normal in size; the mediastinal contour is within normal limits. No acute osseous abnormalities are seen. IMPRESSION: Minimal left basilar atelectasis noted. Lungs otherwise clear. Electronically Signed   By: Garald Balding M.D.   On: 08/13/2018 22:16   Ct Pelvis Wo Contrast  Result Date: 08/17/2018 CLINICAL DATA:  Proctitis with micro perforation, now with abscess at penile shaft, worsening pelvic swelling EXAM: CT PELVIS WITHOUT CONTRAST TECHNIQUE: Multidetector CT imaging of the pelvis was performed following the standard protocol without intravenous contrast. COMPARISON:  CT abdomen/pelvis dated 09/09/2018. MR pelvis dated 07/10/2018. FINDINGS: Motion degraded images. Urinary Tract: Thick-walled bladder with indwelling Foley catheter and nondependent gas. Bowel: Visualized bowel is poorly evaluated but grossly unremarkable. Vascular/Lymphatic: Mild atherosclerotic calcifications of the bilateral iliac vessels. No suspicious pelvic lymphadenopathy. Reproductive: Gas within the bilateral seminal vesicles. Additional gas along the retro prostatic soft tissues (series 3/image 37) and extending along the right pelvic sidewall (series 3/image 31), unchanged. Fiducial radiation markers along the posterior prostate. One of these markers is now displaced posterior to the rectum (series 3/image 29), new from recent CT. Other: Subcutaneous stranding along the anterior lower pelvic wall, left greater than right (series 3/image 32), progressive. Additional fluid/stranding along the proximal penile shaft (series 3/image 54) with diffuse scrotal edema (series 3/image 68). This appearance is compatible with cellulitis. No drainable fluid  collection/abscess on CT. Musculoskeletal: Visualized osseous structures are within normal limits. IMPRESSION: Gas along the bilateral seminal vesicles and retro prostatic soft tissues, likely related to known rectal microperforation, unchanged. Associated cellulitis involving the lower pelvic wall, penile shaft, and scrotum. No drainable fluid collection/abscess on CT. Displaced fiducial radiation marker now posterior to the rectum, new from recent CT. Electronically Signed   By: Julian Hy M.D.   On: 08/17/2018 11:34   US Renal  Result Date: 08/14/2018 CLINICAL DATA:  Acute renal failure. EXAM: RENAL / URINARY TRACT ULTRASOUND COMPLETE COMPARISON:  Noncontrast CT yesterday. FINDINGS: Right Kidney: Renal measurements: 12.6 x 5.2 x 6.1 cm = volume: 207 mL . Echogenicity within normal limits. No mass or hydronephrosis visualized. Left Kidney: Renal measurements: 12.8 x 5.1 x 5.7 cm = volume: 195 mL. Echogenicity within normal limits. No mass or hydronephrosis visualized. Bladder: Bladder wall thickening of 9 mm, as seen on CT. IMPRESSION: 1. Bladder wall thickening, similar to CT yesterday. 2. Unremarkable sonographic appearance of both kidneys. No hydronephrosis. Electronically Signed   By: Keith Rake M.D.   On: 08/14/2018 03:48   US Scrotum W/doppler  Result Date: 08/13/2018 CLINICAL DATA:  Initial evaluation for acute testicular pain, swelling. EXAM: SCROTAL ULTRASOUND DOPPLER ULTRASOUND OF THE TESTICLES TECHNIQUE: Complete ultrasound examination of the testicles, epididymis, and other scrotal structures was performed. Color and spectral Doppler ultrasound were also utilized to evaluate blood flow to the testicles. COMPARISON:  None. FINDINGS: Right testicle Measurements: 3.5 x 2.5 x 1.9 cm. No mass lesion. Testicular microlithiasis noted. Left testicle Measurements: 3.9 x 2.2 x 2.3 cm. No mass lesion. Testicular microlithiasis noted. Right epididymis:  Normal in size and appearance. Left  epididymis: Diffusely increased vascularity seen within the left epididymis, suggesting acute epididymitis. Hydrocele:  Bilateral hydroceles noted. Varicocele:  Bilateral varicoceles noted. Pulsed Doppler interrogation of both testes demonstrates normal low resistance arterial and venous waveforms bilaterally. Incidental note made of a probable small fat containing right inguinal hernia.  IMPRESSION: 1. Increased vascularity within the left epididymis, suggesting acute epididymitis. No evidence for testicular torsion. 2. Small moderate bilateral hydroceles, likely reactive. 3. Small bilateral varicoceles. 4. Probable small fat containing right inguinal hernia. 5. Testicular microlithiasis. Current literature suggests that testicular microlithiasis is not a significant independent risk factor for development of testicular carcinoma, and that follow up imaging is not warranted in the absence of other risk factors. Monthly testicular self-examination and annual physical exams are considered appropriate surveillance. If patient has other risk factors for testicular carcinoma, then referral to Urology should be considered. (Reference: DeCastro, et al.: A 5-Year Follow up Study of Asymptomatic Men with Testicular Microlithiasis. J Urol 2008; [id number].) Electronically Signed   By: Jeannine Boga M.D.   On: 08/13/2018 22:48   US Abdomen Limited Ruq  Result Date: 08/14/2018 CLINICAL DATA:  Initial evaluation for elevated AST with leukocytosis. EXAM: ULTRASOUND ABDOMEN LIMITED RIGHT UPPER QUADRANT COMPARISON:  None. FINDINGS: Gallbladder: No gallstones or wall thickening visualized. No sonographic Murphy sign noted by sonographer. Common bile duct: Diameter: 4.6 mm Liver: No focal lesion identified. Within normal limits in parenchymal echogenicity. Portal vein is patent on color Doppler imaging with normal direction of blood flow towards the liver. IMPRESSION: Normal right upper quadrant ultrasound. No  cholelithiasis, evidence for acute cholecystitis, or biliary dilatation. Electronically Signed   By: Jeannine Boga M.D.   On: 08/14/2018 03:43     LOS: 10 days   Oren Binet, MD  [facility]  If 7PM-7AM, please contact night-coverage  Please page via [email or web address]-click on MD name and type text message  08/24/2018, 1:02 PM

## 2018-08-25 MED ORDER — OXYCODONE HCL 5 MG PO TABS
5.0000 mg | ORAL_TABLET | ORAL | Status: DC | PRN
  Administered 2018-08-26 – 2018-09-03 (×29): 10 mg via ORAL
  Filled 2018-08-25 (×29): qty 2

## 2018-08-25 MED ORDER — SIMETHICONE 80 MG PO CHEW
80.0000 mg | CHEWABLE_TABLET | Freq: Four times a day (QID) | ORAL | Status: DC | PRN
  Administered 2018-08-28 – 2018-09-02 (×6): 80 mg via ORAL
  Filled 2018-08-25 (×6): qty 1

## 2018-08-25 NOTE — Progress Notes (Signed)
PROGRESS NOTE        PATIENT DETAILS Name: Phillip Blackwell Age: 58 y.o. Sex: male Date of Birth: 1961/02/18 Admit Date: 08/13/2018 Admitting Physician Shela Leff, MD [id number], Pcp Not In  Brief Narrative: Patient is a 58 y.o. male with history of hypertension, BPH, OSA prostate cancer status post external beam radiation-presented with sepsis secondary to rectal ulcer with perforation with seeding of the GU tract causing epididymitis/prostatitis.  Further hospital course complicated by worsening penile/scrotal infection and AKI.  See below for further details  Subjective: No chest pain or Shortness of breath. Lying comfortably in bed-some minimal stools in the ostomy  Assessment/Plan: Severe sepsis secondary to perforated rectal ulcer with prostatitis/epididymitis and penoscrotal abscess along with eggerthella lenta bacteremia: Sepsis pathophysiology has resolved, he is now afebrile and clinically improved.  Underwent diverting colostomy and a repeat scrotal debridement on 1/13.  Had undergone initial I&D of his scrotal area on 1/8. This MD spoke with Dr. Michel Bickers on 1/10-recommendations were for 3 weeks of antimicrobial therapy-currently on Unasyn-when more stable we will transition to Augmentin.  Urology planning on third scrotal debridement of the next few days.  AKI: Hemodynamically mediated from ATN in the setting of sepsis, renal function has markedly improved-follow periodically.   Multifactorial anemia secondary to acute blood loss anemia and secondary to acute/critical illness: Hemoglobin stable following transfusion of 1 unit of PRBC-follow CBC periodically.  Hyperbilirubinemia: Appears to be mostly direct hyperbilirubinemia-no biliary dilatation seen on CT scan.  Bilirubin has normalized-not sure if this was all secondary to sepsis.  Follow periodically.    Hypertension: Better controlled-continue metoprolol 100 mg twice daily, prazosin-we  will add amlodipine if blood pressure still uncontrolled over the next few days.    Dyslipidemia: Continue statin  BPH: Continue Flomax  OSA: CPAP nightly-but per RT note-patient has been refusing lately-we will counsel  Moderate protein calorie malnutrition: Continue supplements  Acute debility/deconditioning: Likely secondary to acute illness-social worker consulted for SNF placement on discharge-hopefully this can be done in the next few days/later this week  DVT Prophylaxis: SCD's  Code Status: Full code  Family Communication: Spouse at bedside  Disposition Plan: Remain inpatient-requires several more days of hospitalization before consideration of discharge  Antimicrobial agents: Anti-infectives (From admission, onward)   Start     Dose/Rate Route Frequency Ordered Stop   08/23/18 1500  Ampicillin-Sulbactam (UNASYN) 3 g in sodium chloride 0.9 % 100 mL IVPB     3 g 200 mL/hr over 30 Minutes Intravenous Every 6 hours 08/23/18 1434     08/23/18 0845  Ampicillin-Sulbactam (UNASYN) 3 g in sodium chloride 0.9 % 100 mL IVPB     3 g 200 mL/hr over 30 Minutes Intravenous To [facility]Stay [facility] 08/23/18 0839 08/23/18 1018   08/20/18 2200  ceFEPIme (MAXIPIME) 2 g in sodium chloride 0.9 % 100 mL IVPB  Status:  Discontinued     2 g 200 mL/hr over 30 Minutes Intravenous Every 12 hours 08/20/18 1315 08/20/18 1406   08/20/18 1500  Ampicillin-Sulbactam (UNASYN) 3 g in sodium chloride 0.9 % 100 mL IVPB  Status:  Discontinued     3 g 200 mL/hr over 30 Minutes Intravenous Every 6 hours 08/20/18 1420 08/23/18 0839   08/17/18 1400  metroNIDAZOLE (FLAGYL) tablet 500 mg  Status:  Discontinued     500 mg Oral Every 8 hours 08/17/18  1303 08/20/18 1406   08/16/18 2000  ceFEPIme (MAXIPIME) 2 g in sodium chloride 0.9 % 100 mL IVPB  Status:  Discontinued     2 g 200 mL/hr over 30 Minutes Intravenous Every 24 hours 08/16/18 1328 08/20/18 1315   08/15/18 1030  vancomycin (VANCOCIN) 1,250 mg in sodium  chloride 0.9 % 250 mL IVPB     1,250 mg 166.7 mL/hr over 90 Minutes Intravenous  Once 08/15/18 0942 08/15/18 1444   08/14/18 2000  ceFEPIme (MAXIPIME) 1 g in sodium chloride 0.9 % 100 mL IVPB  Status:  Discontinued     1 g 200 mL/hr over 30 Minutes Intravenous Every 24 hours 08/13/18 2052 08/16/18 1328   08/13/18 2052  vancomycin variable dose per unstable renal function (pharmacist dosing)  Status:  Discontinued      Does not apply See admin instructions 08/13/18 2052 08/16/18 1442   08/13/18 2045  ceFEPIme (MAXIPIME) 2 g in sodium chloride 0.9 % 100 mL IVPB     2 g 200 mL/hr over 30 Minutes Intravenous  Once 08/13/18 2038 08/13/18 2141   08/13/18 2045  metroNIDAZOLE (FLAGYL) IVPB 500 mg  Status:  Discontinued     500 mg 100 mL/hr over 60 Minutes Intravenous Every 8 hours 08/13/18 2038 08/17/18 1303   08/13/18 2045  vancomycin (VANCOCIN) IVPB 1000 mg/200 mL premix     1,000 mg 200 mL/hr over 60 Minutes Intravenous  Once 08/13/18 2038 08/13/18 2333      Procedures: None  CONSULTS:  general surgery and urology  Time spent: 25 minutes-Greater than 50% of this time was spent in counseling, explanation of diagnosis, planning of further management, and coordination of care.  MEDICATIONS: Scheduled Meds: . sodium chloride   Intravenous Once  . atorvastatin  40 mg Oral Q supper  . cholecalciferol  2,000 Units Oral Daily  . feeding supplement  1 Container Oral BID BM  . feeding supplement (PRO-STAT SUGAR FREE 64)  30 mL Oral BID  . ferrous sulfate  325 mg Oral Q breakfast  . metoprolol tartrate  100 mg Oral BID  . multivitamin with minerals  1 tablet Oral Daily  . pantoprazole  40 mg Oral BID AC  . polycarbophil  625 mg Oral BID  . prazosin  2 mg Oral QHS   Continuous Infusions: . sodium chloride Stopped (08/17/18 0659)  . ampicillin-sulbactam (UNASYN) IV 3 g (08/25/18 0758)  . lactated ringers 10 mL/hr at 08/18/18 2212  . lactated ringers 10 mL/hr at 08/23/18 0851   PRN  Meds:.sodium chloride, acetaminophen **OR** acetaminophen, clonazePAM, ondansetron (ZOFRAN) IV, oxyCODONE, simethicone, tamsulosin   PHYSICAL EXAM: Vital signs: Vitals:   08/24/18 1058 08/24/18 1658 08/24/18 2052 08/25/18 0442  BP: (!) 149/83 132/69 134/75 139/68  Pulse: 87 76 77 78  Resp:  (!) 24 17 17   Temp:  97.6 F (36.4 C) 98.9 F (37.2 C) 98.8 F (37.1 C)  TempSrc:  Oral Oral Oral  SpO2:  97% 98% 95%  Weight:      Height:       Filed Weights   08/13/18 1922 08/13/18 2234  Weight: 86.2 kg 86.2 kg   Body mass index is 25.77 kg/m.   General appearance:Awake, alert, not in any distress.  Eyes:no scleral icterus. HEENT: Atraumatic and Normocephalic Neck: supple, no JVD. Resp:Good air entry bilaterally,no rales or rhonchi CVS: S1 S2 regular, no murmurs.  GI: Bowel sounds present, Non tender and not distended with no gaurding, rigidity or rebound.  Stoma in  place. Extremities: B/L Lower Ext shows no edema, both legs are warm to touch Neurology:  Non focal Psychiatric: Normal judgment and insight. Normal mood. Musculoskeletal:No digital cyanosis Skin:No Rash, warm and dry Wounds:N/A  I have personally reviewed following labs and imaging studies  LABORATORY DATA: CBC: Recent Labs  Lab 08/20/18 0522 08/21/18 0318 08/22/18 0409 08/22/18 1454 08/23/18 0407 08/24/18 0324  WBC 11.5* 10.7* 9.8  --  9.1 8.8  HGB 7.2* 7.3* 6.9* 8.6* 8.1* 8.4*  HCT 23.1* 23.4* 22.2* 27.7* 26.7* 26.5*  MCV 89.2 89.7 90.2  --  90.5 90.8  PLT 560* 629* 594*  --  625* 601*    Basic Metabolic Panel: Recent Labs  Lab 08/19/18 0737 08/20/18 0522 08/21/18 0318 08/23/18 0407 08/24/18 0324  NA 134* 135 134* 136 137  K 4.4 3.9 4.0 3.9 4.3  CL 106 105 105 106 107  CO2 21* 21* 21* 23 24  GLUCOSE 172* 111* 90 101* 138*  BUN 38* 33* 24* 9 10  CREATININE 1.55* 1.43* 1.49* 1.33* 1.25*  CALCIUM 8.0* 7.8* 7.9* 7.9* 8.1*    GFR: Estimated Creatinine Clearance: 69.8 mL/min (A) (by C-G  formula based on SCr of 1.25 mg/dL (H)).  Liver Function Tests: Recent Labs  Lab 08/19/18 0339  AST 42*  ALT 31  ALKPHOS 66  BILITOT 1.6*  PROT 6.2*  ALBUMIN 1.2*   No results for input(s): LIPASE, AMYLASE in the last 168 hours. No results for input(s): AMMONIA in the last 168 hours.  Coagulation Profile: No results for input(s): INR, PROTIME in the last 168 hours.  Cardiac Enzymes: No results for input(s): CKTOTAL, CKMB, CKMBINDEX, TROPONINI in the last 168 hours.  BNP (last 3 results) No results for input(s): PROBNP in the last 8760 hours.  HbA1C: No results for input(s): HGBA1C in the last 72 hours.  CBG: Recent Labs  Lab 08/18/18 2229 08/21/18 0958  GLUCAP 100* 118*    Lipid Profile: No results for input(s): CHOL, HDL, LDLCALC, TRIG, CHOLHDL, LDLDIRECT in the last 72 hours.  Thyroid Function Tests: No results for input(s): TSH, T4TOTAL, FREET4, T3FREE, THYROIDAB in the last 72 hours.  Anemia Panel: No results for input(s): VITAMINB12, FOLATE, FERRITIN, TIBC, IRON, RETICCTPCT in the last 72 hours.  Urine analysis:    Component Value Date/Time   COLORURINE AMBER (A) 08/13/2018 2010   APPEARANCEUR CLOUDY (A) 08/13/2018 2010   LABSPEC 1.014 08/13/2018 2010   PHURINE 5.0 08/13/2018 2010   GLUCOSEU NEGATIVE 08/13/2018 2010   HGBUR NEGATIVE 08/13/2018 2010   [place] NEGATIVE 08/13/2018 2010   [place] NEGATIVE 08/13/2018 2010   PROTEINUR 30 (A) 08/13/2018 2010   NITRITE NEGATIVE 08/13/2018 2010   LEUKOCYTESUR NEGATIVE 08/13/2018 2010    Sepsis Labs: Lactic Acid, Venous    Component Value Date/Time   LATICACIDVEN 1.95 (H) 08/13/2018 2313    MICROBIOLOGY: Recent Results (from the past 240 hour(s))  Surgical pcr screen     Status: Abnormal   Collection Time: 08/18/18 10:06 AM  Result Value Ref Range Status   MRSA, PCR NEGATIVE NEGATIVE Final   Staphylococcus aureus POSITIVE (A) NEGATIVE Final    Comment: (NOTE) The Xpert SA Assay (FDA  approved for NASAL specimens in patients 60 years of age and older), is one component of a comprehensive surveillance program. It is not intended to diagnose infection nor to guide or monitor treatment. Performed at [facility], [place] [address], [place], [place] [address]   Aerobic/Anaerobic Culture (surgical/deep wound)  Status: Abnormal   Collection Time: 08/18/18  6:14 PM  Result Value Ref Range Status   Specimen Description TISSUE  Final   Special Requests PENOSCROTAL  Final   Gram Stain   Final    RARE WBC PRESENT, PREDOMINANTLY PMN ABUNDANT GRAM POSITIVE COCCI ABUNDANT GRAM NEGATIVE RODS ABUNDANT GRAM POSITIVE RODS Performed at [facility], [place] [address], [place], [place] [address]    Culture (A)  Final    MULTIPLE ORGANISMS PRESENT, NONE PREDOMINANT MIXED ANAEROBIC FLORA PRESENT.  CALL LAB IF FURTHER IID REQUIRED.    Report Status 08/23/2018 FINAL  Final  Aerobic/Anaerobic Culture (surgical/deep wound)     Status: None   Collection Time: 08/18/18  6:16 PM  Result Value Ref Range Status   Specimen Description ABSCESS  Final   Special Requests PENOSCROTAL SWABS  Final   Gram Stain   Final    NO WBC SEEN ABUNDANT GRAM POSITIVE COCCI MODERATE GRAM NEGATIVE RODS MODERATE GRAM POSITIVE RODS Performed at [facility], [place] [address] [place], [place] [address]    Culture   Final    NORMAL SKIN FLORA MIXED ANAEROBIC FLORA PRESENT.  CALL LAB IF FURTHER IID REQUIRED.    Report Status 08/23/2018 FINAL  Final    RADIOLOGY STUDIES/RESULTS: Ct Abdomen Pelvis Wo Contrast  Result Date: 08/13/2018 CLINICAL DATA:  Groin swelling EXAM: CT ABDOMEN AND PELVIS WITHOUT CONTRAST TECHNIQUE: Multidetector CT imaging of the abdomen and pelvis was performed following the standard protocol without IV contrast. COMPARISON:  MRI 07/10/2018 FINDINGS: Lower chest: Lung bases demonstrate no acute consolidation or effusion. The heart size is normal. Small hiatal hernia.  Hepatobiliary: No focal liver abnormality is seen. No gallstones, gallbladder wall thickening, or biliary dilatation. Pancreas: Unremarkable. No pancreatic ductal dilatation or surrounding inflammatory changes. Spleen: Normal in size without focal abnormality. Adrenals/Urinary Tract: Adrenal glands are within normal limits. No hydronephrosis. Thick-walled urinary bladder Stomach/Bowel: Stomach is nonenlarged. No dilated small bowel. Extensive gas collection Between the rectum and prostate. Vascular/Lymphatic: Mild aortic atherosclerosis. No aneurysm. No significantly enlarged lymph nodes. Reproductive: Metallic densities along the posterior aspect of the prostate. Large gas collections around the prostate gland. Moderate edema of the scrotum and around the penis. Other: No free air. Fat in the left inguinal canal. Edema within the pubic soft tissues. Gas and fluid collection extending from the rectal area and along both sides of the prostate gland. Musculoskeletal: No acute or suspicious abnormality. IMPRESSION: 1. Moderate edema and mild inflammatory changes involving the scrotum and soft tissues about the penis, but without soft tissue emphysema. Mild edema within the pubic soft tissues. 2. Abnormal gas and fluid collection between the rectum and prostate gland and extending along the right and left aspects of the prostate gland, felt to correspond to previously demonstrated rectal ulcer and sinus tracks. 3. Thick-walled appearance of the urinary bladder, possible cystitis Electronically Signed   By: Donavan Foil M.D.   On: 08/13/2018 23:19   Dg Chest 2 View  Result Date: 08/13/2018 CLINICAL DATA:  Acute onset of genital swelling. Fever. EXAM: CHEST - 2 VIEW COMPARISON:  Chest radiograph performed 07/07/2018 FINDINGS: The lungs are well-aerated. Minimal left basilar atelectasis is noted. There is no evidence of pleural effusion or pneumothorax. The heart is normal in size; the mediastinal contour is within  normal limits. No acute osseous abnormalities are seen. IMPRESSION: Minimal left basilar atelectasis noted. Lungs otherwise clear. Electronically Signed   By: Garald Balding M.D.   On: 08/13/2018  22:16   Ct Pelvis Wo Contrast  Result Date: 08/17/2018 CLINICAL DATA:  Proctitis with micro perforation, now with abscess at penile shaft, worsening pelvic swelling EXAM: CT PELVIS WITHOUT CONTRAST TECHNIQUE: Multidetector CT imaging of the pelvis was performed following the standard protocol without intravenous contrast. COMPARISON:  CT abdomen/pelvis dated 09/09/2018. MR pelvis dated 07/10/2018. FINDINGS: Motion degraded images. Urinary Tract: Thick-walled bladder with indwelling Foley catheter and nondependent gas. Bowel: Visualized bowel is poorly evaluated but grossly unremarkable. Vascular/Lymphatic: Mild atherosclerotic calcifications of the bilateral iliac vessels. No suspicious pelvic lymphadenopathy. Reproductive: Gas within the bilateral seminal vesicles. Additional gas along the retro prostatic soft tissues (series 3/image 37) and extending along the right pelvic sidewall (series 3/image 31), unchanged. Fiducial radiation markers along the posterior prostate. One of these markers is now displaced posterior to the rectum (series 3/image 29), new from recent CT. Other: Subcutaneous stranding along the anterior lower pelvic wall, left greater than right (series 3/image 32), progressive. Additional fluid/stranding along the proximal penile shaft (series 3/image 54) with diffuse scrotal edema (series 3/image 68). This appearance is compatible with cellulitis. No drainable fluid collection/abscess on CT. Musculoskeletal: Visualized osseous structures are within normal limits. IMPRESSION: Gas along the bilateral seminal vesicles and retro prostatic soft tissues, likely related to known rectal microperforation, unchanged. Associated cellulitis involving the lower pelvic wall, penile shaft, and scrotum. No drainable  fluid collection/abscess on CT. Displaced fiducial radiation marker now posterior to the rectum, new from recent CT. Electronically Signed   By: Julian Hy M.D.   On: 08/17/2018 11:34   US Renal  Result Date: 08/14/2018 CLINICAL DATA:  Acute renal failure. EXAM: RENAL / URINARY TRACT ULTRASOUND COMPLETE COMPARISON:  Noncontrast CT yesterday. FINDINGS: Right Kidney: Renal measurements: 12.6 x 5.2 x 6.1 cm = volume: 207 mL . Echogenicity within normal limits. No mass or hydronephrosis visualized. Left Kidney: Renal measurements: 12.8 x 5.1 x 5.7 cm = volume: 195 mL. Echogenicity within normal limits. No mass or hydronephrosis visualized. Bladder: Bladder wall thickening of 9 mm, as seen on CT. IMPRESSION: 1. Bladder wall thickening, similar to CT yesterday. 2. Unremarkable sonographic appearance of both kidneys. No hydronephrosis. Electronically Signed   By: Keith Rake M.D.   On: 08/14/2018 03:48   US Scrotum W/doppler  Result Date: 08/13/2018 CLINICAL DATA:  Initial evaluation for acute testicular pain, swelling. EXAM: SCROTAL ULTRASOUND DOPPLER ULTRASOUND OF THE TESTICLES TECHNIQUE: Complete ultrasound examination of the testicles, epididymis, and other scrotal structures was performed. Color and spectral Doppler ultrasound were also utilized to evaluate blood flow to the testicles. COMPARISON:  None. FINDINGS: Right testicle Measurements: 3.5 x 2.5 x 1.9 cm. No mass lesion. Testicular microlithiasis noted. Left testicle Measurements: 3.9 x 2.2 x 2.3 cm. No mass lesion. Testicular microlithiasis noted. Right epididymis:  Normal in size and appearance. Left epididymis: Diffusely increased vascularity seen within the left epididymis, suggesting acute epididymitis. Hydrocele:  Bilateral hydroceles noted. Varicocele:  Bilateral varicoceles noted. Pulsed Doppler interrogation of both testes demonstrates normal low resistance arterial and venous waveforms bilaterally. Incidental note made of a probable  small fat containing right inguinal hernia. IMPRESSION: 1. Increased vascularity within the left epididymis, suggesting acute epididymitis. No evidence for testicular torsion. 2. Small moderate bilateral hydroceles, likely reactive. 3. Small bilateral varicoceles. 4. Probable small fat containing right inguinal hernia. 5. Testicular microlithiasis. Current literature suggests that testicular microlithiasis is not a significant independent risk factor for development of testicular carcinoma, and that follow up imaging is not warranted in the absence  of other risk factors. Monthly testicular self-examination and annual physical exams are considered appropriate surveillance. If patient has other risk factors for testicular carcinoma, then referral to Urology should be considered. (Reference: DeCastro, et al.: A 5-Year Follow up Study of Asymptomatic Men with Testicular Microlithiasis. J Urol 2008; [id number].) Electronically Signed   By: Jeannine Boga M.D.   On: 08/13/2018 22:48   US Abdomen Limited Ruq  Result Date: 08/14/2018 CLINICAL DATA:  Initial evaluation for elevated AST with leukocytosis. EXAM: ULTRASOUND ABDOMEN LIMITED RIGHT UPPER QUADRANT COMPARISON:  None. FINDINGS: Gallbladder: No gallstones or wall thickening visualized. No sonographic Murphy sign noted by sonographer. Common bile duct: Diameter: 4.6 mm Liver: No focal lesion identified. Within normal limits in parenchymal echogenicity. Portal vein is patent on color Doppler imaging with normal direction of blood flow towards the liver. IMPRESSION: Normal right upper quadrant ultrasound. No cholelithiasis, evidence for acute cholecystitis, or biliary dilatation. Electronically Signed   By: Jeannine Boga M.D.   On: 08/14/2018 03:43     LOS: 11 days   Oren Binet, MD  [facility]  If 7PM-7AM, please contact night-coverage  Please page via [email or web address] [email or web address]-click on MD name and type text  message  08/25/2018, 10:53 AM

## 2018-08-25 NOTE — Progress Notes (Signed)
CSW received consult regarding PT recommendation of SNF at discharge.  Patient is refusing SNF. Wife is at bedside. He states that the [facility] hospital or SNF contracted facilities are too far away for his family to visit and he feels he can do well at home with home health. CSW discussed the patient's current level of function related to mobilbity with the patient and spouse. They acknowledge understanding of this and feel the patient would be able to have their care needs met at home. Wife expresses agreement with plan and requests a walker as well. RNCM aware.  CSW signing off. Please re-consult if further needs arise.   Phillip Locus Natsumi Whitsitt LCSW [phone]

## 2018-08-25 NOTE — Progress Notes (Signed)
[facility] Progress Note  2 Days Post-Op  Subjective: CC: rectal pain Had increased rectal pain from hard chair yesterday. Intermittent abdominal pain but feels like gas. Passing gas/stool via stoma.   Objective: Vital signs in last 24 hours: Temp:  [97.6 F (36.4 C)-98.9 F (37.2 C)] 98.8 F (37.1 C) (01/15 0442) Pulse Rate:  [76-87] 78 (01/15 0442) Resp:  [17-24] 17 (01/15 0442) BP: (132-149)/(68-83) 139/68 (01/15 0442) SpO2:  [95 %-98 %] 95 % (01/15 0442) Last BM Date: 08/24/17  Intake/Output from previous day: 01/14 0701 - 01/15 0700 In: -  Out: 1300 [Urine:1300] Intake/Output this shift: No intake/output data recorded.  PE: Gen:  Alert, NAD, pleasant Card:  Regular rate and rhythm, pedal pulses 2+ BL Pulm:  Normal effort, clear to auscultation bilaterally Abd: Soft, appropriately TTP, mildly distended, +BS, stoma flat with stool in ostomy, laparoscopic incisions c/d/i Skin: warm and dry, no rashes  Psych: A&Ox3   Lab Results:  Recent Labs    08/23/18 0407 08/24/18 0324  WBC 9.1 8.8  HGB 8.1* 8.4*  HCT 26.7* 26.5*  PLT 625* 601*   BMET Recent Labs    08/23/18 0407 08/24/18 0324  NA 136 137  K 3.9 4.3  CL 106 107  CO2 23 24  GLUCOSE 101* 138*  BUN 9 10  CREATININE 1.33* 1.25*  CALCIUM 7.9* 8.1*   PT/INR No results for input(s): LABPROT, INR in the last 72 hours. CMP     Component Value Date/Time   NA 137 08/24/2018 0324   K 4.3 08/24/2018 0324   CL 107 08/24/2018 0324   CO2 24 08/24/2018 0324   GLUCOSE 138 (H) 08/24/2018 0324   BUN 10 08/24/2018 0324   CREATININE 1.25 (H) 08/24/2018 0324   CALCIUM 8.1 (L) 08/24/2018 0324   PROT 6.2 (L) 08/19/2018 0339   ALBUMIN 1.2 (L) 08/19/2018 0339   AST 42 (H) 08/19/2018 0339   ALT 31 08/19/2018 0339   ALKPHOS 66 08/19/2018 0339   BILITOT 1.6 (H) 08/19/2018 0339   GFRNONAA >60 08/24/2018 0324   GFRAA >60 08/24/2018 0324   Lipase     Component Value Date/Time   LIPASE 33  08/13/2018 2047       Studies/Results: No results found.  Anti-infectives: Anti-infectives (From admission, onward)   Start     Dose/Rate Route Frequency Ordered Stop   08/23/18 1500  Ampicillin-Sulbactam (UNASYN) 3 g in sodium chloride 0.9 % 100 mL IVPB     3 g 200 mL/hr over 30 Minutes Intravenous Every 6 hours 08/23/18 1434     08/23/18 0845  Ampicillin-Sulbactam (UNASYN) 3 g in sodium chloride 0.9 % 100 mL IVPB     3 g 200 mL/hr over 30 Minutes Intravenous To ShortStay Surgical 08/23/18 0839 08/23/18 1018   08/20/18 2200  ceFEPIme (MAXIPIME) 2 g in sodium chloride 0.9 % 100 mL IVPB  Status:  Discontinued     2 g 200 mL/hr over 30 Minutes Intravenous Every 12 hours 08/20/18 1315 08/20/18 1406   08/20/18 1500  Ampicillin-Sulbactam (UNASYN) 3 g in sodium chloride 0.9 % 100 mL IVPB  Status:  Discontinued     3 g 200 mL/hr over 30 Minutes Intravenous Every 6 hours 08/20/18 1420 08/23/18 0839   08/17/18 1400  metroNIDAZOLE (FLAGYL) tablet 500 mg  Status:  Discontinued     500 mg Oral Every 8 hours 08/17/18 1303 08/20/18 1406   08/16/18 2000  ceFEPIme (MAXIPIME) 2 g in sodium chloride 0.9 % 100  mL IVPB  Status:  Discontinued     2 g 200 mL/hr over 30 Minutes Intravenous Every 24 hours 08/16/18 1328 08/20/18 1315   08/15/18 1030  vancomycin (VANCOCIN) 1,250 mg in sodium chloride 0.9 % 250 mL IVPB     1,250 mg 166.7 mL/hr over 90 Minutes Intravenous  Once 08/15/18 0942 08/15/18 1444   08/14/18 2000  ceFEPIme (MAXIPIME) 1 g in sodium chloride 0.9 % 100 mL IVPB  Status:  Discontinued     1 g 200 mL/hr over 30 Minutes Intravenous Every 24 hours 08/13/18 2052 08/16/18 1328   08/13/18 2052  vancomycin variable dose per unstable renal function (pharmacist dosing)  Status:  Discontinued      Does not apply See admin instructions 08/13/18 2052 08/16/18 1442   08/13/18 2045  ceFEPIme (MAXIPIME) 2 g in sodium chloride 0.9 % 100 mL IVPB     2 g 200 mL/hr over 30 Minutes Intravenous  Once  08/13/18 2038 08/13/18 2141   08/13/18 2045  metroNIDAZOLE (FLAGYL) IVPB 500 mg  Status:  Discontinued     500 mg 100 mL/hr over 60 Minutes Intravenous Every 8 hours 08/13/18 2038 08/17/18 1303   08/13/18 2045  vancomycin (VANCOCIN) IVPB 1000 mg/200 mL premix     1,000 mg 200 mL/hr over 60 Minutes Intravenous  Once 08/13/18 2038 08/13/18 2333       Assessment/Plan Hx of prostate CA, recurrent,with external beam radiation Gleason 6 OSA - refusing CPAP BPH Hiccups x 1 month AKI - creatinine 1.25 1/14, trending down Anemia - chronic disease, h/h 8.4/26.5 1/14  Full thickness perforation of rectal ulcer Severe sepsis - prostatitis/epididymitis/colitis/severe penoscrotal infection  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 Georgette Dover - POD#2 - WOC following, [facility] RN for continued stoma teaching ordered - follow up in chart S/p penoscrotal wound debridement 08/23/18 Dr. Tresa Moore - wound care per urology  ID -Maxipime 1/3>1/10,Flagyl 1/3>1/10; Loralyn [place] 1/10>> FEN -Reg diet VTE -SCDs, ok for chemical VTE from a general surgery standpoint Foley -in place  Therapies recommending CIR. Continue current care.   LOS: 11 days    Brigid Re , [facility] 08/25/2018, 8:12 AM Pager: [phone] Consults: [phone] Mon-Fri 7:00 am-4:30 pm Sat-Sun 7:00 am-11:30 am

## 2018-08-25 NOTE — Progress Notes (Signed)
Subjective/Chief Complaint:   1 - Prostate Cancer, Likely Recurrent - s/p primary external beam radiation completed 03/2017 in [facility] for larger volume Gleason 6 disease. Initial PSA 5.5 and diagnosed 2017 by Dr. Alyson Ingles.  Recent PSA Course: 06/2017 - PSA 1.1 06/2018 - PSA 2.6   2 - Acute Renal Failure - Cr 7.2 / K 5.0 by ER labs 08/14/18 on eval sepsis. [place] and CT w/o hydro or distend bladder. Baseline Cr 1.1 as recently as 07/2018.  3 - Sepsis of GI/GU Origin with Prostatitis / Epididymitis / Colitis / Severe Penoscrotal Infection - lactate 3.8, WBC 42k by ET labs 08/14/18 on eval fevers and malaise. UA unremarkable, UCX negative. [place] 1/3 Egertella Scrotal US and Pelvic CT with inflmmation of epidydimis / prostate / peri-rectal gas c/w likely infection stemming from colon microperf seeding prostate and GU tract. Initially had good systemic response to IV ABX but worsened penoscrotal skin swelling and then some purlulent drainage 1/7, Repeat CT 1/7 with some progression of peri-rectal gas and new displacement of prior fiducial marker (confirming hole in rectum) and still no large fluid collections / sub-Q gas and exam with new draining sinus tracts at penoscrotal junction. 1st stage OR penoscrotal wound debridment 1/8, 3 large penrose drains place, approx 5cm2 non-viable skin removed. New Wound CX 1/8 - skin flora. +MRSA screen. Repeat debridement 1/13 and additional 4cm2 penile shaft tissue removed, drains replaced, end colostomy by Dr. Gershon Crane.   4 - Rectal Ulcer/Suspect Perforation - known DX rectal ulcer with periodic bleeding following prostate radiation 2018. Flex-Sig / BX 06/2018 of ulcer edges negative for malignancy. CT 08/14/18 with gas in peri-rectal / peri-prostatic space c/w likely microperforation. Lap end colostomy performed by gen surg 1/13.   Today " Phillip Blackwell" is slowly improving. Increasing mobility with walker. GFR and Hgb remain good. Still some wound drainage. Starting to have some  colostomy output    Objective: Vital signs in last 24 hours: Temp:  [97.6 F (36.4 C)-98.9 F (37.2 C)] 98.8 F (37.1 C) (01/15 0442) Pulse Rate:  [76-78] 78 (01/15 0442) Resp:  [17-24] 17 (01/15 0442) BP: (132-139)/(68-75) 139/68 (01/15 0442) SpO2:  [95 %-98 %] 95 % (01/15 0442) Last BM Date: 08/25/18  Intake/Output from previous day: 01/14 0701 - 01/15 0700 In: -  Out: 1300 [Urine:1300] Intake/Output this shift: No intake/output data recorded.   EXAM: NAD in pre-op holding, family at bedside.  Head: Normocephalic.  Eyes: Pupils are equal, round, and reactive to light.  Neck: Normal range of motion.  Cardiovascular: regular rate.  Respiratory:  Non-labored on room air GI: Soft. There is no abdominal tenderness. There is no rebound and no guarding. LLQ with some green liquid and gas.  Genitourinary:    Genitourinary Comments: Recent surgical drians with improved but persistent purlulence and local swelling of penis, scrotum, perineum. Musculoskeletal: Normal range of motion.  Neurological: He is alert.  Skin: Skin is warm.  Psychiatric: He has a normal mood and affect. His behavior is normal.  Lab Results:  Recent Labs    08/23/18 0407 08/24/18 0324  WBC 9.1 8.8  HGB 8.1* 8.4*  HCT 26.7* 26.5*  PLT 625* 601*   BMET Recent Labs    08/23/18 0407 08/24/18 0324  NA 136 137  K 3.9 4.3  CL 106 107  CO2 23 24  GLUCOSE 101* 138*  BUN 9 10  CREATININE 1.33* 1.25*  CALCIUM 7.9* 8.1*   PT/INR No results for input(s): LABPROT, INR in the  last 72 hours. ABG No results for input(s): PHART, HCO3 in the last 72 hours.  Invalid input(s): PCO2, PO2  Studies/Results: No results found.  Anti-infectives: Anti-infectives (From admission, onward)   Start     Dose/Rate Route Frequency Ordered Stop   08/23/18 1500  Ampicillin-Sulbactam (UNASYN) 3 g in sodium chloride 0.9 % 100 mL IVPB     3 g 200 mL/hr over 30 Minutes Intravenous Every 6 hours 08/23/18 1434      08/23/18 0845  Ampicillin-Sulbactam (UNASYN) 3 g in sodium chloride 0.9 % 100 mL IVPB     3 g 200 mL/hr over 30 Minutes Intravenous To ShortStay Surgical 08/23/18 0839 08/23/18 1018   08/20/18 2200  ceFEPIme (MAXIPIME) 2 g in sodium chloride 0.9 % 100 mL IVPB  Status:  Discontinued     2 g 200 mL/hr over 30 Minutes Intravenous Every 12 hours 08/20/18 1315 08/20/18 1406   08/20/18 1500  Ampicillin-Sulbactam (UNASYN) 3 g in sodium chloride 0.9 % 100 mL IVPB  Status:  Discontinued     3 g 200 mL/hr over 30 Minutes Intravenous Every 6 hours 08/20/18 1420 08/23/18 0839   08/17/18 1400  metroNIDAZOLE (FLAGYL) tablet 500 mg  Status:  Discontinued     500 mg Oral Every 8 hours 08/17/18 1303 08/20/18 1406   08/16/18 2000  ceFEPIme (MAXIPIME) 2 g in sodium chloride 0.9 % 100 mL IVPB  Status:  Discontinued     2 g 200 mL/hr over 30 Minutes Intravenous Every 24 hours 08/16/18 1328 08/20/18 1315   08/15/18 1030  vancomycin (VANCOCIN) 1,250 mg in sodium chloride 0.9 % 250 mL IVPB     1,250 mg 166.7 mL/hr over 90 Minutes Intravenous  Once 08/15/18 0942 08/15/18 1444   08/14/18 2000  ceFEPIme (MAXIPIME) 1 g in sodium chloride 0.9 % 100 mL IVPB  Status:  Discontinued     1 g 200 mL/hr over 30 Minutes Intravenous Every 24 hours 08/13/18 2052 08/16/18 1328   08/13/18 2052  vancomycin variable dose per unstable renal function (pharmacist dosing)  Status:  Discontinued      Does not apply See admin instructions 08/13/18 2052 08/16/18 1442   08/13/18 2045  ceFEPIme (MAXIPIME) 2 g in sodium chloride 0.9 % 100 mL IVPB     2 g 200 mL/hr over 30 Minutes Intravenous  Once 08/13/18 2038 08/13/18 2141   08/13/18 2045  metroNIDAZOLE (FLAGYL) IVPB 500 mg  Status:  Discontinued     500 mg 100 mL/hr over 60 Minutes Intravenous Every 8 hours 08/13/18 2038 08/17/18 1303   08/13/18 2045  vancomycin (VANCOCIN) IVPB 1000 mg/200 mL premix     1,000 mg 200 mL/hr over 60 Minutes Intravenous  Once 08/13/18 2038 08/13/18 2333       Assessment/Plan:  1 - Prostate Cancer, Likely Recurrent - PSA pattern most c/w active disease, but no signs of metastatic involvement. May warrant tissue sampling via transperineal approach in elective setting. No further diagnostics or intervention this admission.   2 - Acute Renal Failure - Resolved,  likely pre-renal due to sepsis. No hydro to warrant stenting / neph tubes. Rec continued foley for accurate UOP monitoring.  3 - Sepsis of GI/GU Origin with Prostatitis / Epididymitis / Colitis / Severe Penoscrotal Infection - Improving sytemically and locally on IV ABX and s/p staged local wound debridement and end colostomy.   Will likely need another debridment at least before transitioning wound towards closure / grafitng, scheduled now for 1/16 around 4pm.  Will consult plastics once clears infectious paramaters of wound.  4 - Rectal Ulcer/Suspect Perforation - likely from progression of known radiation-induced rectal ulcer. Now s/p fecal diversion which should drastically (but likely not completely) reduce output across perforation.   WIll follow, please call me directly anytime with questions.   Alexis Frock 08/25/2018

## 2018-08-25 NOTE — Op Note (Addendum)
NAME: Phillip Blackwell, LOFLIN MEDICAL RECORD IR:[id number] ACCOUNT [id number] DATE OF BIRTH:[birth year] FACILITY: [facility] LOCATION: [id number] PHYSICIAN:Jassiah Viviano, MD  OPERATIVE REPORT  DATE OF PROCEDURE:  08/23/2018  PREOPERATIVE DIAGNOSIS:  Complex penoscrotal wound, rectal perforation, advanced prostate cancer.  PROCEDURE:  Second-stage penoscrotal wound debridement, removal of approximately 4 sq cm tissue.  ESTIMATED BLOOD LOSS:  Nil.  COMPLICATIONS:  None.  SPECIMENS:  None.  FINDINGS: 1.  Continued complex penoscrotal wound improved, approximately 4 sq cm nonviable penile shaft tissue removed. 2.  Visible viability of bilateral testes.  DRAINS:  Penrose drains x3 to wound drainage.  INDICATION:  The patient is a very pleasant but unfortunate 58 year old man who is status post primary radiation therapy for prostate cancer performed at another facility.  He has had a complicated course following this.  He now has a rectal perforation  with gross contamination of his pelvis, GU organs, and complex wound infection.  He has been admitted on aggressive intravenous antibiotics after presenting septic and is status post first-stage wound debridement on 08/20/2018.  After continued high  output from his wounds likely representing tracking from known rectal perforation, it was felt in consultation with general surgery that clearly a fecal diversion would be warranted with end colostomy.  He underwent this successfully today under the care  of the general surgery team and now presents for his second-stage wound debridement by my team.  Informed consent was obtained and placed in the medical record.  DESCRIPTION OF PROCEDURE:  The patient being identified, the procedure being second-stage penoscrotal wound debridement was confirmed.  Procedure timeout was performed.  Intravenous antibiotics were administered and verified.  General endotracheal  anesthesia was already introduced.  The patient was  already under anesthesia in room #1.  He was previously in a supine position.  He was transitioned into a low lithotomy position, and a sterile field was created by prepping and draping his penis,  perineum and proximal thighs using iodine.  After removal of is in situ Foley catheter and in situ previous Penrose drains, exam under anesthesia revealed improvement but persistence of complex penoscrotal wound and infection.  There was much less  nonviable tissue, but there was still some purulent drainage from all aspects of the wound including peripubic, intrapenile, inferior scrotal, and perineal, approximately 4 cm, and nonviable tissue was removed from the penile shaft.   Next, approximately 4000 mL of saline was used via the Pulsavac irrigation device, and all tissue was debrided using irrigation, debridement of the lower anterior abdominal wall beneath the umbilicus via the penoscrotal incision.   Notably, none of this appeared to be deep to fascia.  The fascia appeared completely viable on penile shaft and scrotal contents.  The bilateral testes were inspected and suitably viable.  A perineal incision was also irrigated as well copiously.  Given  continued purulence from the wound, it was clearly felt that any attempts at reconstruction were not yet appropriate, and continued drainage is the best option.  As such, 3 large Penrose drains were once again applied, and the inferior-most tracking from  the perineum to dependent scrotum was sutured in place with nylon and 2 additional drains, 1 on the right, 1 on the left, tracking from the area of the penoscrotal junction superiorly to the dependent scrotum.  A dressing of ABD and mesh underwear was  applied, and procedure was terminated.  The patient tolerated the procedure well.  No immediate perioperative complications.  The patient was taken to the Post Anesthesia  Care Unit in stable condition.  Notably, a 16-French Foley catheter was placed  during the  procedure for penile retraction.  Temperature probe type foley was placed with 10 mL in the balloon.  LN/NUANCE  D:08/23/2018 T:08/23/2018 JOB:[id number]  4cm2 of skin EXCISED.

## 2018-08-25 NOTE — Anesthesia Postprocedure Evaluation (Signed)
Anesthesia Post Note  Patient: Phillip Blackwell  Procedure(s) Performed: LAPAROSCOPIC  END COLOSTOMY CREATION (N/A Abdomen) SECONDARY PENILE/SCROTUM DEBRIDEMENT (N/A Scrotum)     Patient location during evaluation: PACU Anesthesia Type: General Level of consciousness: awake and alert Pain management: pain level controlled Vital Signs Assessment: post-procedure vital signs reviewed and stable Respiratory status: spontaneous breathing, nonlabored ventilation and respiratory function stable Cardiovascular status: blood pressure returned to baseline and stable Postop Assessment: no apparent nausea or vomiting Anesthetic complications: no    Last Vitals:  Vitals:   08/25/18 0442 08/25/18 1630  BP: 139/68 (!) 146/72  Pulse: 78 81  Resp: 17   Temp: 37.1 C 37.8 C  SpO2: 95% 95%    Last Pain:  Vitals:   08/25/18 1630  TempSrc: Tympanic  PainSc:                  Audry Pili

## 2018-08-25 NOTE — Progress Notes (Addendum)
NCM made aware by CSW pt is declining SNF placement @ d/c , however, agreeble to home health services. NCM confirmed with pt/wife. Pt is [facility]. NCM called( voice message left) and paged  [place] pager , [phone], office # [phone], [phone] to assist with setting up home health services and potential DME needs, NCM awaiting response. Whitman Hero RN,BSN,CM

## 2018-08-26 ENCOUNTER — Encounter (HOSPITAL_COMMUNITY): Payer: Self-pay | Admitting: *Deleted

## 2018-08-26 ENCOUNTER — Inpatient Hospital Stay (HOSPITAL_COMMUNITY): Payer: No Typology Code available for payment source | Admitting: Certified Registered Nurse Anesthetist

## 2018-08-26 ENCOUNTER — Encounter (HOSPITAL_COMMUNITY)
Admission: EM | Disposition: A | Discharge status: Home Health | Payer: Self-pay | Source: Home / Self Care | Attending: Internal Medicine

## 2018-08-26 HISTORY — PX: SCROTAL EXPLORATION: SHX2386

## 2018-08-26 LAB — BASIC METABOLIC PANEL
Anion gap: 7 (ref 5–15)
BUN: 11 mg/dL (ref 6–20)
CALCIUM: 8.1 mg/dL — AB (ref 8.9–10.3)
CO2: 23 mmol/L (ref 22–32)
Chloride: 106 mmol/L (ref 98–111)
Creatinine, Ser: 1.27 mg/dL — ABNORMAL HIGH (ref 0.61–1.24)
GFR calc non Af Amer: >60 mL/min (ref >60)
Glucose, Bld: 113 mg/dL — ABNORMAL HIGH (ref 70–99)
Potassium: 3.9 mmol/L (ref 3.5–5.1)
SODIUM: 136 mmol/L (ref 135–145)

## 2018-08-26 LAB — CBC
HCT: 24.3 % — ABNORMAL LOW (ref 39.0–52.0)
Hemoglobin: 7.8 g/dL — ABNORMAL LOW (ref 13.0–17.0)
MCH: 29 pg (ref 26.0–34.0)
MCHC: 32.1 g/dL (ref 30.0–36.0)
MCV: 90.3 fL (ref 80.0–100.0)
NRBC: 0 % (ref 0.0–0.2)
PLATELETS: 499 10*3/uL — AB (ref 150–400)
RBC: 2.69 MIL/uL — ABNORMAL LOW (ref 4.22–5.81)
RDW: 15 % (ref 11.5–15.5)
WBC: 9.8 10*3/uL (ref 4.0–10.5)

## 2018-08-26 LAB — POCT I-STAT 4, (NA,K, GLUC, HGB,HCT)
Glucose, Bld: 93 mg/dL (ref 70–99)
HCT: 25 % — ABNORMAL LOW (ref 39.0–52.0)
Hemoglobin: 8.5 g/dL — ABNORMAL LOW (ref 13.0–17.0)
POTASSIUM: 3.9 mmol/L (ref 3.5–5.1)
Sodium: 139 mmol/L (ref 135–145)

## 2018-08-26 SURGERY — EXPLORATION, SCROTUM
Anesthesia: General | Site: Scrotum

## 2018-08-26 MED ORDER — ONDANSETRON HCL 4 MG/2ML IJ SOLN
INTRAMUSCULAR | Status: AC
  Filled 2018-08-26: qty 2

## 2018-08-26 MED ORDER — DEXAMETHASONE SODIUM PHOSPHATE 10 MG/ML IJ SOLN
INTRAMUSCULAR | Status: DC | PRN
  Administered 2018-08-26: 8 mg via INTRAVENOUS

## 2018-08-26 MED ORDER — FENTANYL CITRATE (PF) 250 MCG/5ML IJ SOLN
INTRAMUSCULAR | Status: AC
  Filled 2018-08-26: qty 5

## 2018-08-26 MED ORDER — 0.9 % SODIUM CHLORIDE (POUR BTL) OPTIME
TOPICAL | Status: DC | PRN
  Administered 2018-08-26: 1000 mL

## 2018-08-26 MED ORDER — LIDOCAINE 2% (20 MG/ML) 5 ML SYRINGE
INTRAMUSCULAR | Status: AC
  Filled 2018-08-26: qty 5

## 2018-08-26 MED ORDER — HYDROMORPHONE HCL 1 MG/ML IJ SOLN
INTRAMUSCULAR | Status: AC
  Administered 2018-08-26: 0.5 mg via INTRAVENOUS
  Filled 2018-08-26: qty 1

## 2018-08-26 MED ORDER — PROPOFOL 10 MG/ML IV BOLUS
INTRAVENOUS | Status: DC | PRN
  Administered 2018-08-26: 160 mg via INTRAVENOUS

## 2018-08-26 MED ORDER — ONDANSETRON HCL 4 MG/2ML IJ SOLN
INTRAMUSCULAR | Status: DC | PRN
  Administered 2018-08-26: 4 mg via INTRAVENOUS

## 2018-08-26 MED ORDER — DEXAMETHASONE SODIUM PHOSPHATE 10 MG/ML IJ SOLN
INTRAMUSCULAR | Status: AC
  Filled 2018-08-26: qty 1

## 2018-08-26 MED ORDER — MUPIROCIN 2 % EX OINT
TOPICAL_OINTMENT | Freq: Two times a day (BID) | CUTANEOUS | Status: DC
  Administered 2018-08-26 – 2018-08-27 (×4): via NASAL
  Administered 2018-08-28: 1 via NASAL
  Administered 2018-08-28: 09:00:00 via NASAL
  Administered 2018-08-29: 1 via NASAL
  Administered 2018-08-30 (×2): via NASAL
  Administered 2018-08-31: 1 via NASAL
  Administered 2018-08-31 – 2018-09-02 (×5): via NASAL
  Filled 2018-08-26 (×6): qty 22

## 2018-08-26 MED ORDER — PHENYLEPHRINE 40 MCG/ML (10ML) SYRINGE FOR IV PUSH (FOR BLOOD PRESSURE SUPPORT)
PREFILLED_SYRINGE | INTRAVENOUS | Status: DC | PRN
  Administered 2018-08-26 (×4): 80 ug via INTRAVENOUS

## 2018-08-26 MED ORDER — MIDAZOLAM HCL 5 MG/5ML IJ SOLN
INTRAMUSCULAR | Status: DC | PRN
  Administered 2018-08-26: 2 mg via INTRAVENOUS

## 2018-08-26 MED ORDER — FENTANYL CITRATE (PF) 100 MCG/2ML IJ SOLN
INTRAMUSCULAR | Status: DC | PRN
  Administered 2018-08-26: 25 ug via INTRAVENOUS
  Administered 2018-08-26: 50 ug via INTRAVENOUS
  Administered 2018-08-26 (×2): 25 ug via INTRAVENOUS

## 2018-08-26 MED ORDER — LIDOCAINE 2% (20 MG/ML) 5 ML SYRINGE
INTRAMUSCULAR | Status: DC | PRN
  Administered 2018-08-26: 100 mg via INTRAVENOUS

## 2018-08-26 MED ORDER — PROPOFOL 10 MG/ML IV BOLUS
INTRAVENOUS | Status: AC
  Filled 2018-08-26: qty 20

## 2018-08-26 MED ORDER — HYDROMORPHONE HCL 1 MG/ML IJ SOLN
0.2500 mg | INTRAMUSCULAR | Status: DC | PRN
  Administered 2018-08-26 (×4): 0.5 mg via INTRAVENOUS

## 2018-08-26 MED ORDER — ROCURONIUM BROMIDE 50 MG/5ML IV SOSY
PREFILLED_SYRINGE | INTRAVENOUS | Status: AC
  Filled 2018-08-26: qty 5

## 2018-08-26 MED ORDER — SODIUM CHLORIDE 0.9 % IR SOLN
Status: DC | PRN
  Administered 2018-08-26: 3000 mL

## 2018-08-26 MED ORDER — MIDAZOLAM HCL 2 MG/2ML IJ SOLN
INTRAMUSCULAR | Status: AC
  Filled 2018-08-26: qty 2

## 2018-08-26 SURGICAL SUPPLY — 37 items
BAG URO CATCHER STRL LF (MISCELLANEOUS) ×2 IMPLANT
BLADE 10 SAFETY STRL DISP (BLADE) ×2 IMPLANT
BLADE SURG 15 STRL LF DISP TIS (BLADE) ×2 IMPLANT
BLADE SURG 15 STRL SS (BLADE) ×4
BRIEF STRETCH FOR OB PAD LRG (UNDERPADS AND DIAPERS) ×1 IMPLANT
CANISTER SUCT 3000ML PPV (MISCELLANEOUS) ×2 IMPLANT
CANISTER WOUND CARE 500ML ATS (WOUND CARE) ×1 IMPLANT
CONNECTOR Y ATS VAC SYSTEM (MISCELLANEOUS) ×1 IMPLANT
CONT SPEC 4OZ CLIKSEAL STRL BL (MISCELLANEOUS) ×2 IMPLANT
COVER WAND RF STERILE (DRAPES) ×2 IMPLANT
DRAIN PENROSE 3/4X12 (DRAIN) ×3 IMPLANT
DRAPE INCISE IOBAN 66X45 STRL (DRAPES) ×1 IMPLANT
DRSG PAD ABDOMINAL 8X10 ST (GAUZE/BANDAGES/DRESSINGS) ×4 IMPLANT
DRSG VAC ATS MED SENSATRAC (GAUZE/BANDAGES/DRESSINGS) ×2 IMPLANT
ELECT REM PT RETURN 9FT ADLT (ELECTROSURGICAL) ×2
ELECTRODE REM PT RTRN 9FT ADLT (ELECTROSURGICAL) ×1 IMPLANT
GAUZE SPONGE 4X4 12PLY STRL (GAUZE/BANDAGES/DRESSINGS) ×2 IMPLANT
GLOVE ORTHO TXT STRL SZ7.5 (GLOVE) ×2 IMPLANT
HANDPIECE INTERPULSE COAX TIP (DISPOSABLE) ×2
IV NS IRRIG 3000ML ARTHROMATIC (IV SOLUTION) ×1 IMPLANT
KIT BASIN OR (CUSTOM PROCEDURE TRAY) ×2 IMPLANT
KIT TURNOVER KIT B (KITS) ×2 IMPLANT
NS IRRIG 1000ML POUR BTL (IV SOLUTION) ×4 IMPLANT
PACK CYSTO (CUSTOM PROCEDURE TRAY) ×2 IMPLANT
PACK GENERAL/GYN (CUSTOM PROCEDURE TRAY) ×1 IMPLANT
PAD ABD 8X10 STRL (GAUZE/BANDAGES/DRESSINGS) ×1 IMPLANT
PAD ARMBOARD 7.5X6 YLW CONV (MISCELLANEOUS) ×4 IMPLANT
SET HNDPC FAN SPRY TIP SCT (DISPOSABLE) IMPLANT
SOL PREP POV-IOD 4OZ 10% (MISCELLANEOUS) ×2 IMPLANT
SPONGE LAP 18X18 X RAY DECT (DISPOSABLE) IMPLANT
SUT CHROMIC 3 0 SH 27 (SUTURE) ×2 IMPLANT
SUT ETHILON 1 TP 1 60 (SUTURE) ×1 IMPLANT
SWAB COLLECTION DEVICE MRSA (MISCELLANEOUS) ×1 IMPLANT
TOWEL OR 17X24 6PK STRL BLUE (TOWEL DISPOSABLE) ×2 IMPLANT
TOWEL OR 17X26 10 PK STRL BLUE (TOWEL DISPOSABLE) ×2 IMPLANT
TRAY FOLEY MTR SLVR 16FR STAT (SET/KITS/TRAYS/PACK) ×1 IMPLANT
WATER STERILE IRR 1000ML POUR (IV SOLUTION) ×2 IMPLANT

## 2018-08-26 NOTE — Progress Notes (Signed)
Day of Surgery   Subjective/Chief Complaint:   1 - Prostate Cancer, Likely Recurrent - s/p primary external beam radiation completed 03/2017 in [place] for larger volume Gleason 6 disease. Initial PSA 5.5 and diagnosed 2017 by Dr. Alyson Ingles.  Recent PSA Course: 06/2017 - PSA 1.1 06/2018 - PSA 2.6   2 - Acute Renal Failure - Cr 7.2 / K 5.0 by ER labs 08/14/18 on eval sepsis. [place] and CT w/o hydro or distend bladder. Baseline Cr 1.1 as recently as 07/2018. Resolved back to baseline with hydration and conclusion of SIRS.   3 - Sepsis of GI/GU Origin with Prostatitis / Epididymitis / Colitis / Severe Penoscrotal Infection - lactate 3.8, WBC 42k by ET labs 08/14/18 on eval fevers and malaise. UA unremarkable, UCX negative. [place] 1/3 [place] Scrotal US and Pelvic CT with inflmmation of epidydimis / prostate / peri-rectal gas c/w likely infection stemming from colon microperf seeding prostate and GU tract. Initially had good systemic response to IV ABX but worsened penoscrotal skin swelling and then some purlulent drainage 1/7, Repeat CT 1/7 with some progression of peri-rectal gas and new displacement of prior fiducial marker (confirming hole in rectum) and still no large fluid collections / sub-Q gas and exam with new draining sinus tracts at penoscrotal junction. 1st stage OR penoscrotal wound debridment 1/8, 3 large penrose drains place, approx 5cm2 non-viable skin removed. New Wound CX 1/8 - skin flora. +MRSA screen. Repeat debridement 1/13 and additional 4cm2 penile shaft tissue removed, drains replaced, end colostomy by Dr. Gershon Crane.   4 - Rectal Ulcer/Suspect Perforation - known DX rectal ulcer with periodic bleeding following prostate radiation 2018. Flex-Sig / BX 06/2018 of ulcer edges negative for malignancy. CT 08/14/18 with gas in peri-rectal / peri-prostatic space c/w likely microperforation. Lap end colostomy performed by gen surg 1/13.   Today " Phillip Blackwell" is stable, ready for 3rd stage wound  debridement today. He is improving, but still with some fevers and thrombocytosis.    Objective: Vital signs in last 24 hours: Temp:  [98.9 F (37.2 C)-100 F (37.8 C)] 99.4 F (37.4 C) (01/16 0422) Pulse Rate:  [68-81] 68 (01/16 1058) Resp:  [17] 17 (01/16 0422) BP: (141-159)/(69-88) 159/77 (01/16 1058) SpO2:  [95 %-97 %] 97 % (01/16 0422) Weight:  [86 kg] 86 kg (01/16 1436) Last BM Date: 08/25/18  Intake/Output from previous day: 01/15 0701 - 01/16 0700 In: 960 [P.O.:960] Out: 2950 [Urine:2950] Intake/Output this shift: Total I/O In: -  Out: 1900 [Urine:1900]   EXAM: NAD in pre-op holding, family at bedside.  Head: Normocephalic.  Eyes: Pupils are equal, round, and reactive to light.  Neck: Normal range of motion.  Cardiovascular: regular rate.  Respiratory:  Non-labored on room air GI: Soft. There is no abdominal tenderness. There is no rebound and no guarding. LLQ with some green liquid and gas.  Genitourinary:    Genitourinary Comments: Recent surgical drians with improved but persistent purlulence and local swelling of penis, scrotum, perineum. Musculoskeletal: Normal range of motion.  Neurological: He is alert.  Skin: Skin is warm.  Psychiatric: He has a normal mood and affect. His behavior is normal.  Lab Results:  Recent Labs    08/24/18 0324 08/26/18 0301  WBC 8.8 9.8  HGB 8.4* 7.8*  HCT 26.5* 24.3*  PLT 601* 499*   BMET Recent Labs    08/24/18 0324 08/26/18 0301  NA 137 136  K 4.3 3.9  CL 107 106  CO2 24 23  GLUCOSE 138*  113*  BUN 10 11  CREATININE 1.25* 1.27*  CALCIUM 8.1* 8.1*   PT/INR No results for input(s): LABPROT, INR in the last 72 hours. ABG No results for input(s): PHART, HCO3 in the last 72 hours.  Invalid input(s): PCO2, PO2  Studies/Results: No results found.  Anti-infectives: Anti-infectives (From admission, onward)   Start     Dose/Rate Route Frequency Ordered Stop   08/23/18 1500  [MAR Hold]  Ampicillin-Sulbactam  (UNASYN) 3 g in sodium chloride 0.9 % 100 mL IVPB     (MAR Hold since Thu 08/26/2018 at 1418. Reason: Transfer to a Procedural area.)   3 g 200 mL/hr over 30 Minutes Intravenous Every 6 hours 08/23/18 1434     08/23/18 0845  Ampicillin-Sulbactam (UNASYN) 3 g in sodium chloride 0.9 % 100 mL IVPB     3 g 200 mL/hr over 30 Minutes Intravenous To ShortStay Surgical 08/23/18 0839 08/23/18 1018   08/20/18 2200  ceFEPIme (MAXIPIME) 2 g in sodium chloride 0.9 % 100 mL IVPB  Status:  Discontinued     2 g 200 mL/hr over 30 Minutes Intravenous Every 12 hours 08/20/18 1315 08/20/18 1406   08/20/18 1500  Ampicillin-Sulbactam (UNASYN) 3 g in sodium chloride 0.9 % 100 mL IVPB  Status:  Discontinued     3 g 200 mL/hr over 30 Minutes Intravenous Every 6 hours 08/20/18 1420 08/23/18 0839   08/17/18 1400  metroNIDAZOLE (FLAGYL) tablet 500 mg  Status:  Discontinued     500 mg Oral Every 8 hours 08/17/18 1303 08/20/18 1406   08/16/18 2000  ceFEPIme (MAXIPIME) 2 g in sodium chloride 0.9 % 100 mL IVPB  Status:  Discontinued     2 g 200 mL/hr over 30 Minutes Intravenous Every 24 hours 08/16/18 1328 08/20/18 1315   08/15/18 1030  vancomycin (VANCOCIN) 1,250 mg in sodium chloride 0.9 % 250 mL IVPB     1,250 mg 166.7 mL/hr over 90 Minutes Intravenous  Once 08/15/18 0942 08/15/18 1444   08/14/18 2000  ceFEPIme (MAXIPIME) 1 g in sodium chloride 0.9 % 100 mL IVPB  Status:  Discontinued     1 g 200 mL/hr over 30 Minutes Intravenous Every 24 hours 08/13/18 2052 08/16/18 1328   08/13/18 2052  vancomycin variable dose per unstable renal function (pharmacist dosing)  Status:  Discontinued      Does not apply See admin instructions 08/13/18 2052 08/16/18 1442   08/13/18 2045  ceFEPIme (MAXIPIME) 2 g in sodium chloride 0.9 % 100 mL IVPB     2 g 200 mL/hr over 30 Minutes Intravenous  Once 08/13/18 2038 08/13/18 2141   08/13/18 2045  metroNIDAZOLE (FLAGYL) IVPB 500 mg  Status:  Discontinued     500 mg 100 mL/hr over 60  Minutes Intravenous Every 8 hours 08/13/18 2038 08/17/18 1303   08/13/18 2045  vancomycin (VANCOCIN) IVPB 1000 mg/200 mL premix     1,000 mg 200 mL/hr over 60 Minutes Intravenous  Once 08/13/18 2038 08/13/18 2333      Assessment/Plan:  1 - Prostate Cancer, Likely Recurrent - PSA pattern most c/w active disease, but no signs of metastatic involvement. May warrant tissue sampling via transperineal approach in elective setting. No further diagnostics or intervention this admission.   2 - Acute Renal Failure - Resolved,  likely pre-renal due to sepsis. No hydro to warrant stenting / neph tubes. Rec continued foley for accurate UOP monitoring.  3 - Sepsis of GI/GU Origin with Prostatitis / Epididymitis / Colitis /  Severe Penoscrotal Infection - Improving sytemically and locally on IV ABX and s/p staged local wound debridement and end colostomy.   Proceed as planned today with 3rd stage wound debridement. Risks, benefits, expected peri-op course and need for further stage debriding and reconstruction discussed with pt and family who voice understanding.    Will consult plastics once clears infectious paramaters of wound.  4 - Rectal Ulcer/Suspect Perforation - likely from progression of known radiation-induced rectal ulcer. Now s/p fecal diversion which should drastically (but likely not completely) reduce output across perforation.   WIll follow, please call me directly anytime with questions.   Alexis Frock 08/26/2018

## 2018-08-26 NOTE — Progress Notes (Addendum)
Patient ID: Phillip Blackwell, male   DOB: April 16, 1961, 58 y.o.   MRN: [id number]    3 Days Post-Op  Subjective: Patient complains of gas, but otherwise no nausea.  Abdominal pain is minimal and well-controlled.  Bag is filling up with a lot of gas, but minimal stool.  Eating some, but doesn't have a great appetite  Objective: Vital signs in last 24 hours: Temp:  [98.9 F (37.2 C)-100 F (37.8 C)] 99.4 F (37.4 C) (01/16 0422) Pulse Rate:  [70-81] 70 (01/16 0422) Resp:  [17] 17 (01/16 0422) BP: (141-153)/(69-88) 141/69 (01/16 0422) SpO2:  [95 %-97 %] 97 % (01/16 0422) Last BM Date: 08/25/18  Intake/Output from previous day: 01/15 0701 - 01/16 0700 In: 960 [P.O.:960] Out: 2950 [Urine:2950] Intake/Output this shift: Total I/O In: -  Out: 800 [Urine:800]  PE: Abd: soft, minimally tender, but appropriate, +BS, incisions are c/d/i with steri-strips present on right side.  Colostomy in place and appears to have just been burped.  No real feculent output.  Appears to have a lot of paste/wax around his stoma.  Stoma is unable to really be visualized.  Lab Results:  Recent Labs    08/24/18 0324 08/26/18 0301  WBC 8.8 9.8  HGB 8.4* 7.8*  HCT 26.5* 24.3*  PLT 601* 499*   BMET Recent Labs    08/24/18 0324 08/26/18 0301  NA 137 136  K 4.3 3.9  CL 107 106  CO2 24 23  GLUCOSE 138* 113*  BUN 10 11  CREATININE 1.25* 1.27*  CALCIUM 8.1* 8.1*   PT/INR No results for input(s): LABPROT, INR in the last 72 hours. CMP     Component Value Date/Time   NA 136 08/26/2018 0301   K 3.9 08/26/2018 0301   CL 106 08/26/2018 0301   CO2 23 08/26/2018 0301   GLUCOSE 113 (H) 08/26/2018 0301   BUN 11 08/26/2018 0301   CREATININE 1.27 (H) 08/26/2018 0301   CALCIUM 8.1 (L) 08/26/2018 0301   PROT 6.2 (L) 08/19/2018 0339   ALBUMIN 1.2 (L) 08/19/2018 0339   AST 42 (H) 08/19/2018 0339   ALT 31 08/19/2018 0339   ALKPHOS 66 08/19/2018 0339   BILITOT 1.6 (H) 08/19/2018 0339   GFRNONAA >60  08/26/2018 0301   GFRAA >60 08/26/2018 0301   Lipase     Component Value Date/Time   LIPASE 33 08/13/2018 2047       Studies/Results: No results found.  Anti-infectives: Anti-infectives (From admission, onward)   Start     Dose/Rate Route Frequency Ordered Stop   08/23/18 1500  Ampicillin-Sulbactam (UNASYN) 3 g in sodium chloride 0.9 % 100 mL IVPB     3 g 200 mL/hr over 30 Minutes Intravenous Every 6 hours 08/23/18 1434     08/23/18 0845  Ampicillin-Sulbactam (UNASYN) 3 g in sodium chloride 0.9 % 100 mL IVPB     3 g 200 mL/hr over 30 Minutes Intravenous To ShortStay Surgical 08/23/18 0839 08/23/18 1018   08/20/18 2200  ceFEPIme (MAXIPIME) 2 g in sodium chloride 0.9 % 100 mL IVPB  Status:  Discontinued     2 g 200 mL/hr over 30 Minutes Intravenous Every 12 hours 08/20/18 1315 08/20/18 1406   08/20/18 1500  Ampicillin-Sulbactam (UNASYN) 3 g in sodium chloride 0.9 % 100 mL IVPB  Status:  Discontinued     3 g 200 mL/hr over 30 Minutes Intravenous Every 6 hours 08/20/18 1420 08/23/18 0839   08/17/18 1400  metroNIDAZOLE (FLAGYL) tablet 500 mg  Status:  Discontinued     500 mg Oral Every 8 hours 08/17/18 1303 08/20/18 1406   08/16/18 2000  ceFEPIme (MAXIPIME) 2 g in sodium chloride 0.9 % 100 mL IVPB  Status:  Discontinued     2 g 200 mL/hr over 30 Minutes Intravenous Every 24 hours 08/16/18 1328 08/20/18 1315   08/15/18 1030  vancomycin (VANCOCIN) 1,250 mg in sodium chloride 0.9 % 250 mL IVPB     1,250 mg 166.7 mL/hr over 90 Minutes Intravenous  Once 08/15/18 0942 08/15/18 1444   08/14/18 2000  ceFEPIme (MAXIPIME) 1 g in sodium chloride 0.9 % 100 mL IVPB  Status:  Discontinued     1 g 200 mL/hr over 30 Minutes Intravenous Every 24 hours 08/13/18 2052 08/16/18 1328   08/13/18 2052  vancomycin variable dose per unstable renal function (pharmacist dosing)  Status:  Discontinued      Does not apply See admin instructions 08/13/18 2052 08/16/18 1442   08/13/18 2045  ceFEPIme (MAXIPIME)  2 g in sodium chloride 0.9 % 100 mL IVPB     2 g 200 mL/hr over 30 Minutes Intravenous  Once 08/13/18 2038 08/13/18 2141   08/13/18 2045  metroNIDAZOLE (FLAGYL) IVPB 500 mg  Status:  Discontinued     500 mg 100 mL/hr over 60 Minutes Intravenous Every 8 hours 08/13/18 2038 08/17/18 1303   08/13/18 2045  vancomycin (VANCOCIN) IVPB 1000 mg/200 mL premix     1,000 mg 200 mL/hr over 60 Minutes Intravenous  Once 08/13/18 2038 08/13/18 2333       Assessment/Plan Hx of prostate CA, recurrent,with external beam radiation Gleason 6 OSA BPH Hiccups x 1 month AKI - creatinine 1.27, stable Anemia - chronic disease, stable  Full thickness perforation of rectal ulcer Severe sepsis - prostatitis/epididymitis/colitis/severe penoscrotal infection  POD 3, 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 Georgette Dover -WOC following, [facility] RN for continued stoma teaching ordered - follow up in chart -patient otherwise surgically stable at this time. S/p penoscrotal wound debridement 08/23/18 Dr. Tresa Moore - wound care per urology  ID -Maxipime 1/3>1/10,Flagyl 1/3>1/10;unasyn 1/10>> FEN -Reg diet VTE -SCDs,ok for chemical VTE from a general surgery standpoint Foley -in place   LOS: 12 days    Henreitta Cea , [facility] 08/26/2018, 10:03 AM Pager: [phone]

## 2018-08-26 NOTE — Brief Op Note (Signed)
08/13/2018 - 08/26/2018  5:20 PM  PATIENT:  Phillip Blackwell  57 y.o. male  PRE-OPERATIVE DIAGNOSIS:  penis and scrotal wound  POST-OPERATIVE DIAGNOSIS:  penis and scrotal wound  PROCEDURE:  Procedure(s): THIRD STAGE PENIS AND SCROTAL DEBRIDEMENT (N/A)  SURGEON:  Surgeon(s) and Role:    * Alexis Frock, MD - Primary  PHYSICIAN ASSISTANT:   ASSISTANTS: none   ANESTHESIA:   general  EBL:  50 mL   BLOOD ADMINISTERED:none  DRAINS: penrose x 3 to wound drainage   LOCAL MEDICATIONS USED:  NONE  SPECIMEN:  No Specimen  DISPOSITION OF SPECIMEN:  N/A  COUNTS:  YES  TOURNIQUET:  * No tourniquets in log *  DICTATION: .Other Dictation: Dictation Number [id number]  PLAN OF CARE: Admit to inpatient   PATIENT DISPOSITION:  PACU - hemodynamically stable.   Delay start of Pharmacological VTE agent (>24hrs) due to surgical blood loss or risk of bleeding: yes

## 2018-08-26 NOTE — Consult Note (Signed)
[facility] Nurse ostomy follow up Stoma type/location: LLQ, end colostomy Stomal assessment/size: 1" x 1 1/4" oval shaped, flush with the skin  Peristomal assessment: intact  Treatment options for stomal/peristomal skin: using 2" barrier ring around the stoma to aid in seal with flush stoma Output minimal, liquid, no stool recorded Ostomy pouching: 1pc.flexible convex with 2" barrier ring.  Education provided:  Explained stoma characteristics (budded, flush, color, texture, care) to wife and patient  Demonstrated pouch change (cutting new skin barrier, measuring stoma, cleaning peristomal skin and stoma, use of barrier ring).  Allowed patient and wife to cut new skin barrier, they both had difficulties with this skill Education on emptying when 1/3 to 1/2 full and how to empty Demonstrated use of wick to clean spout  Discussed bathing, diet, gas, medication use, constipation Patient seems concerned for frequency of pouch change and supplies in general with [employer] benefits. Reiterated HHRN can assist and I will mark [facility] Cat[employer]og for the patient  Enrolled patient in [place] Start Discharge program: Yes  [facility] nurse will see patient Friday before weekend to prepare if DC may be planned for the weekend. Extra supplies in the patient's room  Carvell Hoeffner [facility], [place], [place]

## 2018-08-26 NOTE — Progress Notes (Addendum)
PT Cancellation Note  Patient Details Name: MARKISE HAYMER MRN: [id number] DOB: 10-Jul-1961   Cancelled Treatment:    Reason Eval/Treat Not Completed: (P) Patient declined, no reason specified(Pt awaiting ostomy nurse teaching and requested return tomorrow. )   [name] 08/26/2018, 12:20 PM  [name], PTA Acute Rehabilitation Services Pager [phone] Office [phone]

## 2018-08-26 NOTE — Progress Notes (Signed)
Pharmacy Antibiotic Note  Phillip CREIGHTON is a 58 y.o. male admitted on 08/13/2018 with sepsis. Patient with blood cultures growing rare organism: EGGERTHELLA LENTA. Dr. Sloan Leiter discussed the case with ID today and the plan is to narrow therapy to Unasyn since pt is improving with the plan to transition to PO Augmentin after and extended course.   He is on D14 of abx now. S/p Laparoscopic descending colostomy and Second Stage Penoscrotal Wound Debridementt. Afebrile, WBC wnl, clinically improved.  AKI  Improved, SCr 1.27.  Crcl>>65 ml/min ID recommended 3 weeks IV antibiotic then transition to oral augmentin.  To PO Augmentin soon -when more stable per TRH.   Plan: Continue Unasyn 3g IV q6h Monitor clinical status, renal function daily.    Height: 6' (182.9 cm) Weight: 190 lb (86.2 kg) IBW/kg (Calculated) : 77.6  Temp (24hrs), Avg:99.4 F (37.4 C), Min:98.9 F (37.2 C), Max:100 F (37.8 C)  Recent Labs  Lab 08/20/18 0522 08/21/18 0318 08/22/18 0409 08/23/18 0407 08/24/18 0324 08/26/18 0301  WBC 11.5* 10.7* 9.8 9.1 8.8 9.8  CREATININE 1.43* 1.49*  --  1.33* 1.25* 1.27*    Estimated Creatinine Clearance: 68.7 mL/min (A) (by C-G formula based on SCr of 1.27 mg/dL (H)).    No Known Allergies  Antimicrobials this admission: Vanc 01/03 >> 1/6 Cefepime 01/03 >>1/10 Flagyl 1/3>>1/10 Unasyn 1/10>>  Microbiology Results: 1/3 BCx: EGGERTHELLA LENTA 1/3 UCx: NGF 1/8 Penoscrotal cx>>Mixed  1/5 MRSA PCR: negative  1/8 MRSA surgical PCR: SA positive; MRSA negative  Thank you for allowing pharmacy to be part of this patients care team.  Nicole Cella, [place] Pharmacist [phone] Please check AMION for all [place] phone numbers After 10:00 PM, call [place] 08/26/2018 1:39 PM

## 2018-08-26 NOTE — Anesthesia Postprocedure Evaluation (Signed)
Anesthesia Post Note  Patient: Phillip Blackwell  Procedure(s) Performed: THIRD STAGE PENIS AND SCROTAL IRRIGATION AND  DEBRIDEMENT (N/A Scrotum)     Patient location during evaluation: PACU Anesthesia Type: General Level of consciousness: awake Pain management: pain level controlled Vital Signs Assessment: post-procedure vital signs reviewed and stable Respiratory status: spontaneous breathing Cardiovascular status: stable Postop Assessment: no apparent nausea or vomiting    Last Vitals:  Vitals:   08/26/18 0422 08/26/18 1058  BP: (!) 141/69 (!) 159/77  Pulse: 70 68  Resp: 17   Temp: 37.4 C   SpO2: 97%     Last Pain:  Vitals:   08/26/18 1100  TempSrc:   PainSc: 8                  Carlena Ruybal

## 2018-08-26 NOTE — Anesthesia Preprocedure Evaluation (Signed)
Anesthesia Evaluation  Patient identified by MRN, date of birth, ID band Patient awake    Reviewed: Allergy & Precautions, NPO status , Patient's Chart, lab work & pertinent test results  Airway Mallampati: II  TM Distance: >3 FB     Dental   Pulmonary sleep apnea ,    breath sounds clear to auscultation       Cardiovascular hypertension,  Rhythm:Regular Rate:Normal     Neuro/Psych    GI/Hepatic Neg liver ROS, GERD  ,  Endo/Other    Renal/GU Renal disease     Musculoskeletal   Abdominal   Peds  Hematology   Anesthesia Other Findings   Reproductive/Obstetrics                             Anesthesia Physical Anesthesia Plan  ASA: III  Anesthesia Plan: General   Post-op Pain Management:    Induction: Intravenous  PONV Risk Score and Plan: Treatment may vary due to age or medical condition, Ondansetron, Dexamethasone and Midazolam  Airway Management Planned: Oral ETT  Additional Equipment:   Intra-op Plan:   Post-operative Plan:   Informed Consent: I have reviewed the patients History and Physical, chart, labs and discussed the procedure including the risks, benefits and alternatives for the proposed anesthesia with the patient or authorized representative who has indicated his/her understanding and acceptance.     Dental advisory given  Plan Discussed with: CRNA  Anesthesia Plan Comments:         Anesthesia Quick Evaluation

## 2018-08-26 NOTE — Progress Notes (Signed)
PT Cancellation Note  Patient Details Name: Phillip Blackwell MRN: [id number] DOB: June 19, 1961   Cancelled Treatment:    Reason Eval/Treat Not Completed: (P) Patient at procedure or test/unavailable(Pt off unit for procedure will f/u per POC.  )   [name] 08/26/2018, 3:51 PM  [name], PTA Acute Rehabilitation Services Pager [phone] Office [phone]

## 2018-08-26 NOTE — Anesthesia Procedure Notes (Signed)
Procedure Name: Intubation Performed by: Milford Cage, CRNA Pre-anesthesia Checklist: Patient identified, Emergency Drugs available, Suction available and Patient being monitored Patient Re-evaluated:Patient Re-evaluated prior to induction Oxygen Delivery Method: Circle System Utilized Preoxygenation: Pre-oxygenation with 100% oxygen Induction Type: IV induction Tube type: Oral Number of attempts: 1 Airway Equipment and Method: Stylet and Oral airway Placement Confirmation: ETT inserted through vocal cords under direct vision,  positive ETCO2 and breath sounds checked- equal and bilateral Tube secured with: Tape Dental Injury: Teeth and Oropharynx as per pre-operative assessment

## 2018-08-26 NOTE — Transfer of Care (Signed)
Immediate Anesthesia Transfer of Care Note  Patient: Phillip Blackwell  Procedure(s) Performed: THIRD STAGE PENIS AND SCROTAL IRRIGATION AND  DEBRIDEMENT (N/A Scrotum)  Patient Location: PACU  Anesthesia Type:General  Level of Consciousness: awake and alert   Airway & Oxygen Therapy: Patient Spontanous Breathing and Patient connected to nasal cannula oxygen  Post-op Assessment: Report given to RN, Post -op Vital signs reviewed and stable and Patient moving all extremities  Post vital signs: Reviewed and stable  Last Vitals:  Vitals Value Taken Time  BP 156/75 08/26/2018  5:46 PM  Temp 36.5 C 08/26/2018  5:46 PM  Pulse 67 08/26/2018  5:52 PM  Resp 9 08/26/2018  5:52 PM  SpO2 99 % 08/26/2018  5:52 PM  Vitals shown include unvalidated device data.  Last Pain:  Vitals:   08/26/18 1746  TempSrc:   PainSc: 0-No pain      Patients Stated Pain Goal: 2 (61/95/09 3267)  Complications: No apparent anesthesia complications

## 2018-08-26 NOTE — Progress Notes (Signed)
PROGRESS NOTE        PATIENT DETAILS Name: Phillip Blackwell Age: 58 y.o. Sex: male Date of Birth: September 01, 1960 Admit Date: 08/13/2018 Admitting Physician Shela Leff, MD [id number], Pcp Not In  Brief Narrative: Patient is a 58 y.o. male with history of hypertension, BPH, OSA prostate cancer status post external beam radiation-presented with sepsis secondary to rectal ulcer with perforation with seeding of the GU tract causing epididymitis/prostatitis.  Further hospital course complicated by worsening penile/scrotal infection and AKI.  See below for further details  Subjective: Lying comfortably in bed denies any chest pain or shortness of breath.  Does not want to go to SNF-wants to go home with [facility] home health services when he is ready for discharge.  He is scheduled for third scrotal debridement later this afternoon.  Assessment/Plan: Severe sepsis secondary to perforated rectal ulcer with prostatitis/epididymitis and penoscrotal abscess along with eggerthella lenta bacteremia: Sepsis pathophysiology has resolved, he is now afebrile and clinically improved.  Underwent diverting colostomy and a repeat scrotal debridement on 1/13.  Had undergone initial I&D of his scrotal area on 1/8. This MD spoke with Dr. Michel Bickers on 1/10-recommendations were for 3 weeks of antimicrobial therapy-currently on Unasyn-when more stable we will transition to Augmentin.  Urology planning on third scrotal debridement later today.  AKI: Hemodynamically mediated-likely ATN in the setting of sepsis.  Renal function has markedly improved-follow electrolytes periodically.   Multifactorial anemia secondary to acute blood loss anemia and secondary to acute/critical illness: No further hematochezia-hemoglobin slowly downtrending again-high suspicion for anemia of acute illness causing worsening hemoglobin levels.  Has received 1 unit of PRBC transfusion so far.  Follow CBC periodically.     Hyperbilirubinemia: Appears to be mostly direct hyperbilirubinemia-no biliary dilatation seen on CT scan.  Bilirubin has normalized-not sure if this was all secondary to sepsis.  Follow periodically.    Hypertension: Controlled-continue metoprolol, prazosin.   Dyslipidemia: Continue statin  BPH: Continue Flomax  OSA: CPAP nightly-but per RT note-patient has been refusing lately-we will counsel  Moderate protein calorie malnutrition: Continue supplements  Acute debility/deconditioning: Likely secondary to acute illness-initially plans were for SNF placement-however patient refusing SNF and wants to go home.  Will need [facility] services on discharge  DVT Prophylaxis: SCD's  Code Status: Full code  Family Communication: Spouse at bedside  Disposition Plan: Remain inpatient-requires several more days of hospitalization before consideration of discharge  Antimicrobial agents: Anti-infectives (From admission, onward)   Start     Dose/Rate Route Frequency Ordered Stop   08/23/18 1500  Ampicillin-Sulbactam (UNASYN) 3 g in sodium chloride 0.9 % 100 mL IVPB     3 g 200 mL/hr over 30 Minutes Intravenous Every 6 hours 08/23/18 1434     08/23/18 0845  Ampicillin-Sulbactam (UNASYN) 3 g in sodium chloride 0.9 % 100 mL IVPB     3 g 200 mL/hr over 30 Minutes Intravenous To [facility] 08/23/18 0839 08/23/18 1018   08/20/18 2200  ceFEPIme (MAXIPIME) 2 g in sodium chloride 0.9 % 100 mL IVPB  Status:  Discontinued     2 g 200 mL/hr over 30 Minutes Intravenous Every 12 hours 08/20/18 1315 08/20/18 1406   08/20/18 1500  Ampicillin-Sulbactam (UNASYN) 3 g in sodium chloride 0.9 % 100 mL IVPB  Status:  Discontinued     3 g 200 mL/hr over 30 Minutes  Intravenous Every 6 hours 08/20/18 1420 08/23/18 0839   08/17/18 1400  metroNIDAZOLE (FLAGYL) tablet 500 mg  Status:  Discontinued     500 mg Oral Every 8 hours 08/17/18 1303 08/20/18 1406   08/16/18 2000  ceFEPIme (MAXIPIME) 2 g in  sodium chloride 0.9 % 100 mL IVPB  Status:  Discontinued     2 g 200 mL/hr over 30 Minutes Intravenous Every 24 hours 08/16/18 1328 08/20/18 1315   08/15/18 1030  vancomycin (VANCOCIN) 1,250 mg in sodium chloride 0.9 % 250 mL IVPB     1,250 mg 166.7 mL/hr over 90 Minutes Intravenous  Once 08/15/18 0942 08/15/18 1444   08/14/18 2000  ceFEPIme (MAXIPIME) 1 g in sodium chloride 0.9 % 100 mL IVPB  Status:  Discontinued     1 g 200 mL/hr over 30 Minutes Intravenous Every 24 hours 08/13/18 2052 08/16/18 1328   08/13/18 2052  vancomycin variable dose per unstable renal function (pharmacist dosing)  Status:  Discontinued      Does not apply See admin instructions 08/13/18 2052 08/16/18 1442   08/13/18 2045  ceFEPIme (MAXIPIME) 2 g in sodium chloride 0.9 % 100 mL IVPB     2 g 200 mL/hr over 30 Minutes Intravenous  Once 08/13/18 2038 08/13/18 2141   08/13/18 2045  metroNIDAZOLE (FLAGYL) IVPB 500 mg  Status:  Discontinued     500 mg 100 mL/hr over 60 Minutes Intravenous Every 8 hours 08/13/18 2038 08/17/18 1303   08/13/18 2045  vancomycin (VANCOCIN) IVPB 1000 mg/200 mL premix     1,000 mg 200 mL/hr over 60 Minutes Intravenous  Once 08/13/18 2038 08/13/18 2333      Procedures: None  CONSULTS:  general surgery and urology  Time spent: 25 minutes-Greater than 50% of this time was spent in counseling, explanation of diagnosis, planning of further management, and coordination of care.  MEDICATIONS: Scheduled Meds: . sodium chloride   Intravenous Once  . atorvastatin  40 mg Oral Q supper  . cholecalciferol  2,000 Units Oral Daily  . feeding supplement  1 Container Oral BID BM  . feeding supplement (PRO-STAT SUGAR FREE 64)  30 mL Oral BID  . ferrous sulfate  325 mg Oral Q breakfast  . metoprolol tartrate  100 mg Oral BID  . multivitamin with minerals  1 tablet Oral Daily  . pantoprazole  40 mg Oral BID AC  . polycarbophil  625 mg Oral BID  . prazosin  2 mg Oral QHS   Continuous  Infusions: . sodium chloride Stopped (08/17/18 0659)  . ampicillin-sulbactam (UNASYN) IV 3 g (08/26/18 1106)  . lactated ringers 10 mL/hr at 08/18/18 2212  . lactated ringers 10 mL/hr at 08/25/18 1729   PRN Meds:.sodium chloride, acetaminophen **OR** acetaminophen, clonazePAM, ondansetron (ZOFRAN) IV, oxyCODONE, simethicone, tamsulosin   PHYSICAL EXAM: Vital signs: Vitals:   08/25/18 1630 08/25/18 2128 08/26/18 0422 08/26/18 1058  BP: (!) 146/72 (!) 153/88 (!) 141/69 (!) 159/77  Pulse: 81 78 70 68  Resp:  17 17   Temp: 100 F (37.8 C) 98.9 F (37.2 C) 99.4 F (37.4 C)   TempSrc: Tympanic Oral Oral   SpO2: 95% 96% 97%   Weight:      Height:       Filed Weights   08/13/18 1922 08/13/18 2234  Weight: 86.2 kg 86.2 kg   Body mass index is 25.77 kg/m.   General appearance:Awake, alert, not in any distress.  Eyes:no scleral icterus. HEENT: Atraumatic and Normocephalic Neck:  supple, no JVD. Resp:Good air entry bilaterally,no rales or rhonchi CVS: S1 S2 regular, no murmurs.  GI: Bowel sounds present, Non tender and not distended with no gaurding, rigidity or rebound.  Ostomy in place Extremities: B/L Lower Ext shows no edema, both legs are warm to touch Neurology:  Non focal Psychiatric: Normal judgment and insight. Normal mood. Musculoskeletal:No digital cyanosis Skin:No Rash, warm and dry Wounds:N/A  I have personally reviewed following labs and imaging studies  LABORATORY DATA: CBC: Recent Labs  Lab 08/21/18 0318 08/22/18 0409 08/22/18 1454 08/23/18 0407 08/24/18 0324 08/26/18 0301  WBC 10.7* 9.8  --  9.1 8.8 9.8  HGB 7.3* 6.9* 8.6* 8.1* 8.4* 7.8*  HCT 23.4* 22.2* 27.7* 26.7* 26.5* 24.3*  MCV 89.7 90.2  --  90.5 90.8 90.3  PLT 629* 594*  --  625* 601* 499*    Basic Metabolic Panel: Recent Labs  Lab 08/20/18 0522 08/21/18 0318 08/23/18 0407 08/24/18 0324 08/26/18 0301  NA 135 134* 136 137 136  K 3.9 4.0 3.9 4.3 3.9  CL 105 105 106 107 106  CO2 21*  21* 23 24 23   GLUCOSE 111* 90 101* 138* 113*  BUN 33* 24* 9 10 11   CREATININE 1.43* 1.49* 1.33* 1.25* 1.27*  CALCIUM 7.8* 7.9* 7.9* 8.1* 8.1*    GFR: Estimated Creatinine Clearance: 68.7 mL/min (A) (by C-G formula based on SCr of 1.27 mg/dL (H)).  Liver Function Tests: No results for input(s): AST, ALT, ALKPHOS, BILITOT, PROT, ALBUMIN in the last 168 hours. No results for input(s): LIPASE, AMYLASE in the last 168 hours. No results for input(s): AMMONIA in the last 168 hours.  Coagulation Profile: No results for input(s): INR, PROTIME in the last 168 hours.  Cardiac Enzymes: No results for input(s): CKTOTAL, CKMB, CKMBINDEX, TROPONINI in the last 168 hours.  BNP (last 3 results) No results for input(s): PROBNP in the last 8760 hours.  HbA1C: No results for input(s): HGBA1C in the last 72 hours.  CBG: Recent Labs  Lab 08/21/18 0958  GLUCAP 118*    Lipid Profile: No results for input(s): CHOL, HDL, LDLCALC, TRIG, CHOLHDL, LDLDIRECT in the last 72 hours.  Thyroid Function Tests: No results for input(s): TSH, T4TOTAL, FREET4, T3FREE, THYROIDAB in the last 72 hours.  Anemia Panel: No results for input(s): VITAMINB12, FOLATE, FERRITIN, TIBC, IRON, RETICCTPCT in the last 72 hours.  Urine analysis:    Component Value Date/Time   COLORURINE AMBER (A) 08/13/2018 2010   APPEARANCEUR CLOUDY (A) 08/13/2018 2010   LABSPEC 1.014 08/13/2018 2010   PHURINE 5.0 08/13/2018 2010   GLUCOSEU NEGATIVE 08/13/2018 2010   HGBUR NEGATIVE 08/13/2018 2010   [place] NEGATIVE 08/13/2018 2010   [place] NEGATIVE 08/13/2018 2010   PROTEINUR 30 (A) 08/13/2018 2010   NITRITE NEGATIVE 08/13/2018 2010   LEUKOCYTESUR NEGATIVE 08/13/2018 2010    Sepsis Labs: Lactic Acid, Venous    Component Value Date/Time   LATICACIDVEN 1.95 (H) 08/13/2018 2313    MICROBIOLOGY: Recent Results (from the past 240 hour(s))  Surgical pcr screen     Status: Abnormal   Collection Time: 08/18/18 10:06 AM   Result Value Ref Range Status   MRSA, PCR NEGATIVE NEGATIVE Final   Staphylococcus aureus POSITIVE (A) NEGATIVE Final    Comment: (NOTE) The Xpert SA Assay (FDA approved for NASAL specimens in patients 75 years of age and older), is one component of a comprehensive surveillance program. It is not intended to diagnose infection nor to guide or monitor treatment. Performed at  [facility], [place] [address] [place], [place] [address]   Aerobic/Anaerobic Culture (surgical/deep wound)     Status: Abnormal   Collection Time: 08/18/18  6:14 PM  Result Value Ref Range Status   Specimen Description TISSUE  Final   Special Requests PENOSCROTAL  Final   Gram Stain   Final    RARE WBC PRESENT, PREDOMINANTLY PMN ABUNDANT GRAM POSITIVE COCCI ABUNDANT GRAM NEGATIVE RODS ABUNDANT GRAM POSITIVE RODS Performed at [facility], [address] [place], [place] [address]    Culture (A)  Final    MULTIPLE ORGANISMS PRESENT, NONE PREDOMINANT MIXED ANAEROBIC FLORA PRESENT.  CALL LAB IF FURTHER IID REQUIRED.    Report Status 08/23/2018 FINAL  Final  Aerobic/Anaerobic Culture (surgical/deep wound)     Status: None   Collection Time: 08/18/18  6:16 PM  Result Value Ref Range Status   Specimen Description ABSCESS  Final   Special Requests PENOSCROTAL SWABS  Final   Gram Stain   Final    NO WBC SEEN ABUNDANT GRAM POSITIVE COCCI MODERATE GRAM NEGATIVE RODS MODERATE GRAM POSITIVE RODS Performed at [facility], [place] [address] [place], [place] [address]    Culture   Final    NORMAL SKIN FLORA MIXED ANAEROBIC FLORA PRESENT.  CALL LAB IF FURTHER IID REQUIRED.    Report Status 08/23/2018 FINAL  Final    RADIOLOGY STUDIES/RESULTS: Ct Abdomen Pelvis Wo Contrast  Result Date: 08/13/2018 CLINICAL DATA:  Groin swelling EXAM: CT ABDOMEN AND PELVIS WITHOUT CONTRAST TECHNIQUE: Multidetector CT imaging of the abdomen and pelvis was performed following the standard protocol without IV  contrast. COMPARISON:  MRI 07/10/2018 FINDINGS: Lower chest: Lung bases demonstrate no acute consolidation or effusion. The heart size is normal. Small hiatal hernia. Hepatobiliary: No focal liver abnormality is seen. No gallstones, gallbladder wall thickening, or biliary dilatation. Pancreas: Unremarkable. No pancreatic ductal dilatation or surrounding inflammatory changes. Spleen: Normal in size without focal abnormality. Adrenals/Urinary Tract: Adrenal glands are within normal limits. No hydronephrosis. Thick-walled urinary bladder Stomach/Bowel: Stomach is nonenlarged. No dilated small bowel. Extensive gas collection Between the rectum and prostate. Vascular/Lymphatic: Mild aortic atherosclerosis. No aneurysm. No significantly enlarged lymph nodes. Reproductive: Metallic densities along the posterior aspect of the prostate. Large gas collections around the prostate gland. Moderate edema of the scrotum and around the penis. Other: No free air. Fat in the left inguinal canal. Edema within the pubic soft tissues. Gas and fluid collection extending from the rectal area and along both sides of the prostate gland. Musculoskeletal: No acute or suspicious abnormality. IMPRESSION: 1. Moderate edema and mild inflammatory changes involving the scrotum and soft tissues about the penis, but without soft tissue emphysema. Mild edema within the pubic soft tissues. 2. Abnormal gas and fluid collection between the rectum and prostate gland and extending along the right and left aspects of the prostate gland, felt to correspond to previously demonstrated rectal ulcer and sinus tracks. 3. Thick-walled appearance of the urinary bladder, possible cystitis Electronically Signed   By: Donavan Foil M.D.   On: 08/13/2018 23:19   Dg Chest 2 View  Result Date: 08/13/2018 CLINICAL DATA:  Acute onset of genital swelling. Fever. EXAM: CHEST - 2 VIEW COMPARISON:  Chest radiograph performed 07/07/2018 FINDINGS: The lungs are well-aerated.  Minimal left basilar atelectasis is noted. There is no evidence of pleural effusion or pneumothorax. The heart is normal in size; the mediastinal contour is within normal limits. No acute osseous abnormalities are seen. IMPRESSION:  Minimal left basilar atelectasis noted. Lungs otherwise clear. Electronically Signed   By: Garald Balding M.D.   On: 08/13/2018 22:16   Ct Pelvis Wo Contrast  Result Date: 08/17/2018 CLINICAL DATA:  Proctitis with micro perforation, now with abscess at penile shaft, worsening pelvic swelling EXAM: CT PELVIS WITHOUT CONTRAST TECHNIQUE: Multidetector CT imaging of the pelvis was performed following the standard protocol without intravenous contrast. COMPARISON:  CT abdomen/pelvis dated 09/09/2018. MR pelvis dated 07/10/2018. FINDINGS: Motion degraded images. Urinary Tract: Thick-walled bladder with indwelling Foley catheter and nondependent gas. Bowel: Visualized bowel is poorly evaluated but grossly unremarkable. Vascular/Lymphatic: Mild atherosclerotic calcifications of the bilateral iliac vessels. No suspicious pelvic lymphadenopathy. Reproductive: Gas within the bilateral seminal vesicles. Additional gas along the retro prostatic soft tissues (series 3/image 37) and extending along the right pelvic sidewall (series 3/image 31), unchanged. Fiducial radiation markers along the posterior prostate. One of these markers is now displaced posterior to the rectum (series 3/image 29), new from recent CT. Other: Subcutaneous stranding along the anterior lower pelvic wall, left greater than right (series 3/image 32), progressive. Additional fluid/stranding along the proximal penile shaft (series 3/image 54) with diffuse scrotal edema (series 3/image 68). This appearance is compatible with cellulitis. No drainable fluid collection/abscess on CT. Musculoskeletal: Visualized osseous structures are within normal limits. IMPRESSION: Gas along the bilateral seminal vesicles and retro prostatic soft  tissues, likely related to known rectal microperforation, unchanged. Associated cellulitis involving the lower pelvic wall, penile shaft, and scrotum. No drainable fluid collection/abscess on CT. Displaced fiducial radiation marker now posterior to the rectum, new from recent CT. Electronically Signed   By: Julian Hy M.D.   On: 08/17/2018 11:34   US Renal  Result Date: 08/14/2018 CLINICAL DATA:  Acute renal failure. EXAM: RENAL / URINARY TRACT ULTRASOUND COMPLETE COMPARISON:  Noncontrast CT yesterday. FINDINGS: Right Kidney: Renal measurements: 12.6 x 5.2 x 6.1 cm = volume: 207 mL . Echogenicity within normal limits. No mass or hydronephrosis visualized. Left Kidney: Renal measurements: 12.8 x 5.1 x 5.7 cm = volume: 195 mL. Echogenicity within normal limits. No mass or hydronephrosis visualized. Bladder: Bladder wall thickening of 9 mm, as seen on CT. IMPRESSION: 1. Bladder wall thickening, similar to CT yesterday. 2. Unremarkable sonographic appearance of both kidneys. No hydronephrosis. Electronically Signed   By: Keith Rake M.D.   On: 08/14/2018 03:48   US Scrotum W/doppler  Result Date: 08/13/2018 CLINICAL DATA:  Initial evaluation for acute testicular pain, swelling. EXAM: SCROTAL ULTRASOUND DOPPLER ULTRASOUND OF THE TESTICLES TECHNIQUE: Complete ultrasound examination of the testicles, epididymis, and other scrotal structures was performed. Color and spectral Doppler ultrasound were also utilized to evaluate blood flow to the testicles. COMPARISON:  None. FINDINGS: Right testicle Measurements: 3.5 x 2.5 x 1.9 cm. No mass lesion. Testicular microlithiasis noted. Left testicle Measurements: 3.9 x 2.2 x 2.3 cm. No mass lesion. Testicular microlithiasis noted. Right epididymis:  Normal in size and appearance. Left epididymis: Diffusely increased vascularity seen within the left epididymis, suggesting acute epididymitis. Hydrocele:  Bilateral hydroceles noted. Varicocele:  Bilateral varicoceles  noted. Pulsed Doppler interrogation of both testes demonstrates normal low resistance arterial and venous waveforms bilaterally. Incidental note made of a probable small fat containing right inguinal hernia. IMPRESSION: 1. Increased vascularity within the left epididymis, suggesting acute epididymitis. No evidence for testicular torsion. 2. Small moderate bilateral hydroceles, likely reactive. 3. Small bilateral varicoceles. 4. Probable small fat containing right inguinal hernia. 5. Testicular microlithiasis. Current literature suggests that testicular microlithiasis is not  a significant independent risk factor for development of testicular carcinoma, and that follow up imaging is not warranted in the absence of other risk factors. Monthly testicular self-examination and annual physical exams are considered appropriate surveillance. If patient has other risk factors for testicular carcinoma, then referral to Urology should be considered. (Reference: DeCastro, et al.: A 5-Year Follow up Study of Asymptomatic Men with Testicular Microlithiasis. J Urol 2008; 675:[id number].) Electronically Signed   By: Jeannine Boga M.D.   On: 08/13/2018 22:48   US Abdomen Limited Ruq  Result Date: 08/14/2018 CLINICAL DATA:  Initial evaluation for elevated AST with leukocytosis. EXAM: ULTRASOUND ABDOMEN LIMITED RIGHT UPPER QUADRANT COMPARISON:  None. FINDINGS: Gallbladder: No gallstones or wall thickening visualized. No sonographic Murphy sign noted by sonographer. Common bile duct: Diameter: 4.6 mm Liver: No focal lesion identified. Within normal limits in parenchymal echogenicity. Portal vein is patent on color Doppler imaging with normal direction of blood flow towards the liver. IMPRESSION: Normal right upper quadrant ultrasound. No cholelithiasis, evidence for acute cholecystitis, or biliary dilatation. Electronically Signed   By: Jeannine Boga M.D.   On: 08/14/2018 03:43     LOS: 12 days   Oren Binet,  MD  [facility]  If 7PM-7AM, please contact night-coverage  Please page via [email or web address] [email or web address]-click on MD name and type text message  08/26/2018, 1:19 PM

## 2018-08-27 ENCOUNTER — Encounter (HOSPITAL_COMMUNITY): Payer: Self-pay | Admitting: Urology

## 2018-08-27 DIAGNOSIS — A419 Sepsis, unspecified organism: Principal | ICD-10-CM

## 2018-08-27 DIAGNOSIS — R6520 Severe sepsis without septic shock: Secondary | ICD-10-CM

## 2018-08-27 LAB — BASIC METABOLIC PANEL
Anion gap: 8 (ref 5–15)
BUN: 13 mg/dL (ref 6–20)
CO2: 25 mmol/L (ref 22–32)
Calcium: 8.2 mg/dL — ABNORMAL LOW (ref 8.9–10.3)
Chloride: 105 mmol/L (ref 98–111)
Creatinine, Ser: 1.29 mg/dL — ABNORMAL HIGH (ref 0.61–1.24)
GFR calc Af Amer: >60 mL/min (ref >60)
Glucose, Bld: 135 mg/dL — ABNORMAL HIGH (ref 70–99)
Potassium: 4.8 mmol/L (ref 3.5–5.1)
Sodium: 138 mmol/L (ref 135–145)

## 2018-08-27 LAB — CBC
HCT: 27.1 % — ABNORMAL LOW (ref 39.0–52.0)
Hemoglobin: 8.7 g/dL — ABNORMAL LOW (ref 13.0–17.0)
MCH: 29.7 pg (ref 26.0–34.0)
MCHC: 32.1 g/dL (ref 30.0–36.0)
MCV: 92.5 fL (ref 80.0–100.0)
Platelets: 455 10*3/uL — ABNORMAL HIGH (ref 150–400)
RBC: 2.93 MIL/uL — ABNORMAL LOW (ref 4.22–5.81)
RDW: 14.9 % (ref 11.5–15.5)
WBC: 11.7 10*3/uL — ABNORMAL HIGH (ref 4.0–10.5)
nRBC: 0 % (ref 0.0–0.2)

## 2018-08-27 NOTE — Progress Notes (Signed)
NCM faxed home health orders and DME needs to [facility], Dr. Little Ishikawa @ [phone]. Whitman Hero RN,BSN,CM

## 2018-08-27 NOTE — Consult Note (Signed)
[place] Nurse ostomy follow up Stoma type/location: LLQ colostomy Stomal assessment/size: oval shaped, measured with pouch change yesterday to demonstrate to patient again and his wife. Pink, moist Peristomal assessment: NA Treatment options for stomal/peristomal skin: using 2" barrier ring to aid in seal Output still no output documented, but he self reports some pasty stool Ostomy pouching: 1pc. Flexible convex with 2" barrier ring  Education provided:  Education on emptying when 1/3 to 1/2 full and how to empty. His wife is in the room as well. Reinforced frequency of pouch changes vs. Emptying  Provided patient with [employer] and marked items currently using. Suggested they contact them to determine if they accept his [employer] benefits  Answered patient/family questions:   Patient concerned about consistency of stool and how he will empty. Instructions and demonstration performed.   Enrolled patient in [facility] Discharge program: Yes  Patient would greatly benefit from [facility], he and his wife will need support for continued education with ostomy care.  Supplies in the room (4 flex convex, belt (if needed), 5 barrier rings).    [place] Nurse will follow along with you for continued support with ostomy teaching and care [place] MSN, RN, [place], [place], [place]

## 2018-08-27 NOTE — Op Note (Signed)
NAME: Phillip Blackwell, Phillip Blackwell MEDICAL RECORD VX:[id number] ACCOUNT [id number] DATE OF BIRTH:[birth year] FACILITY: [facility] LOCATION: [facility] PHYSICIAN:Destina Mantei, MD  OPERATIVE REPORT  DATE OF PROCEDURE:  08/26/2018  PREOPERATIVE DIAGNOSIS:  Complex penoscrotal wound, history of rectal perforation and prostate cancer.  PROCEDURE:  Third stage wound debridement.  ESTIMATED BLOOD LOSS:  Nil.  COMPLICATIONS:  None.  SPECIMENS:  None.  FINDINGS:  Continued complex penoscrotal wound with improved, but persistent purulence and significantly improved, but persistent edema and inflammation.  DRAINS:  Penrose drain x3 to wound drainage.  INDICATIONS:  The patient is a pleasant 58 year old gentleman who has unfortunately developed a rectal perforation after radiation for prostate cancer performed at another facility. Due to this rectal drainage, he developed a complex GU infection  including prostatitis, epididymal orchitis and soft tissue infection of his lower abdomen, penis and perineum.  He underwent 2 prior wound debridements.  He has had a significant improvement and his systemic infectious parameters with intravenous  antibiotics.  His wound remains complex with need for staged debridement.  He presents for this today.  Informed consent was obtained and placed in medical record.  DESCRIPTION OF PROCEDURE:  The patient was identified.  The procedure third stage penoscrotal wound was confirmed.  Procedure timeout was performed.  Antibiotics administered, general LMA anesthesia induced.  The patient was placed into a low lithotomy  position, sterile field was created prepped and draped base of the penis, perineum and proximal thighs using iodine after his situ drains had been removed.  Exam of the wound revealed improved granulation, but also some persistent purulence tracking from  all aspects of the wound which included an area from the superior penile abdominal wall junction all the way to the  perineum.  On all of his wound sites the Pulsavac lavage device was used using 3000 mL of fluid.  The whole area was debrided.  There was  minimal nonviable tissue.  There was granulation base all aspects of the wound that was quite favorable.  Initial attempt was made at placing a VAC dressing over the area to hasten granulation; however, due to the complex geometry including penis and  scrotum, there is not a viable configuration that resulted in appropriate sealing of the back of the VAC.  As such, decision was made to proceed with additional wound drainage.  As such, three 1/2-inch Penrose drains were once again placed, one from the  superior right lateral penile, another superior left lateral penile exiting the inferior scrotum and another from the inferior scrotum to the perineum.  They were all anchored in place with large nylon suture and a Foley catheter was placed through to  straight drain.  The procedure terminated.  The patient tolerated the procedure well.  No immediate perioperative complications.  The patient was taken to postanesthesia care in stable condition.  TN/NUANCE  D:08/26/2018 T:08/26/2018 JOB:[id number]

## 2018-08-27 NOTE — Progress Notes (Signed)
PROGRESS NOTE        PATIENT DETAILS Name: Phillip Blackwell Age: 58 y.o. Sex: male Date of Birth: 04-07-1961 Admit Date: 08/13/2018 Admitting Physician Shela Leff, MD [facility], Pcp Not In  Brief Narrative: Patient is a 58 y.o. male with history of hypertension, BPH, OSA prostate cancer status post external beam radiation-presented with sepsis secondary to rectal ulcer with perforation with seeding of the GU tract causing epididymitis/prostatitis.  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 Further hospital course complicated by worsening penile/scrotal infection and AKI.  Evaluated by general surgery and urology-underwent diverting colostomy, and has had multiple trips to the OR for scrotal debridement.  See below for further details   Subjective: Claims he ambulated multiple times this morning-he looks a whole lot better-no major complaints overnight.  No chest pain or shortness of breath.  Assessment/Plan: Severe sepsis secondary to perforated rectal ulcer with prostatitis/epididymitis and penoscrotal abscess along with eggerthella lenta bacteremia: Sepsis pathophysiology has resolved, he is now afebrile and clinically improved.  Underwent diverting colostomy and a repeat scrotal debridement on 1/13.  Had undergone initial I&D of his scrotal area on 1/8. This MD spoke with Dr. Michel Bickers on 1/10-recommendations were for 3 weeks (total) of antimicrobial therapy-currently on Unasyn-when he does not require any further scrotal debridements we will transition to Augmentin.  Urology following-we will await further recommendations to see if patient requires further debridements.  Per urology note-they are also planning to consult plastics as well.    AKI: Hemodynamically mediated-likely ATN in the setting of sepsis.  Renal function has markedly improved-follow electrolytes periodically.   Multifactorial anemia secondary to acute blood loss anemia and  secondary to acute/critical illness: No further hematochezia-hemoglobin levels are relatively stable-no further hematochezia, suspicion for acute illness causing him to have persistent anemia rather than further GI bleeding.  Has been transfused 1 unit of PRBC so far, follow CBC periodically.   Hyperbilirubinemia: Appears to be mostly direct hyperbilirubinemia-no biliary dilatation seen on CT scan.  Bilirubin has normalized-not sure if this was all secondary to sepsis.  Follow periodically.    Hypertension: Controlled-continue metoprolol, prazosin.   Dyslipidemia: Continue statin  BPH: Continue Flomax  OSA: CPAP nightly-but per RT note-patient has been refusing lately-we will counsel  Moderate protein calorie malnutrition: Continue supplements  Acute debility/deconditioning: Likely secondary to acute illness-initially plans were for SNF placement-however patient refusing SNF and wants to go home.  Will need maximum home health services on discharge  DVT Prophylaxis: SCD's  Code Status: Full code  Family Communication: Spouse at bedside  Disposition Plan: Remain inpatient-requires several more days of hospitalization before consideration of discharge  Antimicrobial agents: Anti-infectives (From admission, onward)   Start     Dose/Rate Route Frequency Ordered Stop   08/23/18 1500  Ampicillin-Sulbactam (UNASYN) 3 g in sodium chloride 0.9 % 100 mL IVPB     3 g 200 mL/hr over 30 Minutes Intravenous Every 6 hours 08/23/18 1434     08/23/18 0845  Ampicillin-Sulbactam (UNASYN) 3 g in sodium chloride 0.9 % 100 mL IVPB     3 g 200 mL/hr over 30 Minutes Intravenous To ShortStay [facility] 08/23/18 0839 08/23/18 1018   08/20/18 2200  ceFEPIme (MAXIPIME) 2 g in sodium chloride 0.9 % 100 mL IVPB  Status:  Discontinued     2 g 200 mL/hr over 30 Minutes Intravenous  Every 12 hours 08/20/18 1315 08/20/18 1406   08/20/18 1500  Ampicillin-Sulbactam (UNASYN) 3 g in sodium chloride 0.9 % 100 mL IVPB   Status:  Discontinued     3 g 200 mL/hr over 30 Minutes Intravenous Every 6 hours 08/20/18 1420 08/23/18 0839   08/17/18 1400  metroNIDAZOLE (FLAGYL) tablet 500 mg  Status:  Discontinued     500 mg Oral Every 8 hours 08/17/18 1303 08/20/18 1406   08/16/18 2000  ceFEPIme (MAXIPIME) 2 g in sodium chloride 0.9 % 100 mL IVPB  Status:  Discontinued     2 g 200 mL/hr over 30 Minutes Intravenous Every 24 hours 08/16/18 1328 08/20/18 1315   08/15/18 1030  vancomycin (VANCOCIN) 1,250 mg in sodium chloride 0.9 % 250 mL IVPB     1,250 mg 166.7 mL/hr over 90 Minutes Intravenous  Once 08/15/18 0942 08/15/18 1444   08/14/18 2000  ceFEPIme (MAXIPIME) 1 g in sodium chloride 0.9 % 100 mL IVPB  Status:  Discontinued     1 g 200 mL/hr over 30 Minutes Intravenous Every 24 hours 08/13/18 2052 08/16/18 1328   08/13/18 2052  vancomycin variable dose per unstable renal function (pharmacist dosing)  Status:  Discontinued      Does not apply See admin instructions 08/13/18 2052 08/16/18 1442   08/13/18 2045  ceFEPIme (MAXIPIME) 2 g in sodium chloride 0.9 % 100 mL IVPB     2 g 200 mL/hr over 30 Minutes Intravenous  Once 08/13/18 2038 08/13/18 2141   08/13/18 2045  metroNIDAZOLE (FLAGYL) IVPB 500 mg  Status:  Discontinued     500 mg 100 mL/hr over 60 Minutes Intravenous Every 8 hours 08/13/18 2038 08/17/18 1303   08/13/18 2045  vancomycin (VANCOCIN) IVPB 1000 mg/200 mL premix     1,000 mg 200 mL/hr over 60 Minutes Intravenous  Once 08/13/18 2038 08/13/18 2333      Procedures: None  CONSULTS:  general surgery and urology  Time spent: 25 minutes-Greater than 50% of this time was spent in counseling, explanation of diagnosis, planning of further management, and coordination of care.  MEDICATIONS: Scheduled Meds: . sodium chloride   Intravenous Once  . atorvastatin  40 mg Oral Q supper  . cholecalciferol  2,000 Units Oral Daily  . feeding supplement  1 Container Oral BID BM  . feeding supplement (PRO-STAT  SUGAR FREE 64)  30 mL Oral BID  . ferrous sulfate  325 mg Oral Q breakfast  . metoprolol tartrate  100 mg Oral BID  . multivitamin with minerals  1 tablet Oral Daily  . mupirocin ointment   Nasal BID  . pantoprazole  40 mg Oral BID AC  . polycarbophil  625 mg Oral BID  . prazosin  2 mg Oral QHS   Continuous Infusions: . sodium chloride 100 mL (08/26/18 2126)  . ampicillin-sulbactam (UNASYN) IV 3 g (08/27/18 0839)  . lactated ringers 10 mL/hr at 08/18/18 2212  . lactated ringers 10 mL/hr at 08/25/18 1729   PRN Meds:.sodium chloride, acetaminophen **OR** acetaminophen, clonazePAM, ondansetron (ZOFRAN) IV, oxyCODONE, simethicone, tamsulosin   PHYSICAL EXAM: Vital signs: Vitals:   08/26/18 1816 08/26/18 2154 08/27/18 0437 08/27/18 0822  BP: (!) 152/81 (!) 145/79 (!) 152/73 (!) 153/87  Pulse: 60 72 63 75  Resp: 13 18 18    Temp:  98.8 F (37.1 C) 97.9 F (36.6 C)   TempSrc:  Oral Oral   SpO2: 98% 99% 99%   Weight:      Height:  Filed Weights   08/13/18 1922 08/13/18 2234 08/26/18 1436  Weight: 86.2 kg 86.2 kg 86 kg   Body mass index is 25.71 kg/m.   General appearance:Awake, alert, not in any distress.  Eyes:no scleral icterus. HEENT: Atraumatic and Normocephalic Neck: supple, no JVD. Resp:Good air entry bilaterally,no rales or rhonchi CVS: S1 S2 regular, no murmurs.  GI: Bowel sounds present, Non tender and not distended with no gaurding, rigidity or rebound.  Ostomy in place Extremities: B/L Lower Ext shows no edema, both legs are warm to touch Neurology:  Non focal Psychiatric: Normal judgment and insight. Normal mood. Musculoskeletal:No digital cyanosis Skin:No Rash, warm and dry Wounds:N/A  I have personally reviewed following labs and imaging studies  LABORATORY DATA: CBC: Recent Labs  Lab 08/22/18 0409  08/23/18 0407 08/24/18 0324 08/26/18 0301 08/26/18 1618 08/27/18 0430  WBC 9.8  --  9.1 8.8 9.8  --  11.7*  HGB 6.9*   < > 8.1* 8.4* 7.8* 8.5*  8.7*  HCT 22.2*   < > 26.7* 26.5* 24.3* 25.0* 27.1*  MCV 90.2  --  90.5 90.8 90.3  --  92.5  PLT 594*  --  625* 601* 499*  --  455*   < > = values in this interval not displayed.    Basic Metabolic Panel: Recent Labs  Lab 08/21/18 0318 08/23/18 0407 08/24/18 0324 08/26/18 0301 08/26/18 1618 08/27/18 0430  NA 134* 136 137 136 139 138  K 4.0 3.9 4.3 3.9 3.9 4.8  CL 105 106 107 106  --  105  CO2 21* 23 24 23   --  25  GLUCOSE 90 101* 138* 113* 93 135*  BUN 24* 9 10 11   --  13  CREATININE 1.49* 1.33* 1.25* 1.27*  --  1.29*  CALCIUM 7.9* 7.9* 8.1* 8.1*  --  8.2*    GFR: Estimated Creatinine Clearance: 67.7 mL/min (A) (by C-G formula based on SCr of 1.29 mg/dL (H)).  Liver Function Tests: No results for input(s): AST, ALT, ALKPHOS, BILITOT, PROT, ALBUMIN in the last 168 hours. No results for input(s): LIPASE, AMYLASE in the last 168 hours. No results for input(s): AMMONIA in the last 168 hours.  Coagulation Profile: No results for input(s): INR, PROTIME in the last 168 hours.  Cardiac Enzymes: No results for input(s): CKTOTAL, CKMB, CKMBINDEX, TROPONINI in the last 168 hours.  BNP (last 3 results) No results for input(s): PROBNP in the last 8760 hours.  HbA1C: No results for input(s): HGBA1C in the last 72 hours.  CBG: Recent Labs  Lab 08/21/18 0958  GLUCAP 118*    Lipid Profile: No results for input(s): CHOL, HDL, LDLCALC, TRIG, CHOLHDL, LDLDIRECT in the last 72 hours.  Thyroid Function Tests: No results for input(s): TSH, T4TOTAL, FREET4, T3FREE, THYROIDAB in the last 72 hours.  Anemia Panel: No results for input(s): VITAMINB12, FOLATE, FERRITIN, TIBC, IRON, RETICCTPCT in the last 72 hours.  Urine analysis:    Component Value Date/Time   COLORURINE AMBER (A) 08/13/2018 2010   APPEARANCEUR CLOUDY (A) 08/13/2018 2010   LABSPEC 1.014 08/13/2018 2010   PHURINE 5.0 08/13/2018 2010   GLUCOSEU NEGATIVE 08/13/2018 2010   HGBUR NEGATIVE 08/13/2018 2010    [place] NEGATIVE 08/13/2018 2010   [place] NEGATIVE 08/13/2018 2010   PROTEINUR 30 (A) 08/13/2018 2010   NITRITE NEGATIVE 08/13/2018 2010   LEUKOCYTESUR NEGATIVE 08/13/2018 2010    Sepsis Labs: Lactic Acid, Venous    Component Value Date/Time   LATICACIDVEN 1.95 (H) 08/13/2018 2313  MICROBIOLOGY: Recent Results (from the past 240 hour(s))  Surgical pcr screen     Status: Abnormal   Collection Time: 08/18/18 10:06 AM  Result Value Ref Range Status   MRSA, PCR NEGATIVE NEGATIVE Final   Staphylococcus aureus POSITIVE (A) NEGATIVE Final    Comment: (NOTE) The Xpert SA Assay (FDA approved for NASAL specimens in patients 50 years of age and older), is one component of a comprehensive surveillance program. It is not intended to diagnose infection nor to guide or monitor treatment. Performed at [facility], [place] [address] [place], [place] [address]   Aerobic/Anaerobic Culture (surgical/deep wound)     Status: Abnormal   Collection Time: 08/18/18  6:14 PM  Result Value Ref Range Status   Specimen Description TISSUE  Final   Special Requests PENOSCROTAL  Final   Gram Stain   Final    RARE WBC PRESENT, PREDOMINANTLY PMN ABUNDANT GRAM POSITIVE COCCI ABUNDANT GRAM NEGATIVE RODS ABUNDANT GRAM POSITIVE RODS Performed at [facility], [address], [place], [place] [address]    Culture (A)  Final    MULTIPLE ORGANISMS PRESENT, NONE PREDOMINANT MIXED ANAEROBIC FLORA PRESENT.  CALL LAB IF FURTHER IID REQUIRED.    Report Status 08/23/2018 FINAL  Final  Aerobic/Anaerobic Culture (surgical/deep wound)     Status: None   Collection Time: 08/18/18  6:16 PM  Result Value Ref Range Status   Specimen Description ABSCESS  Final   Special Requests PENOSCROTAL SWABS  Final   Gram Stain   Final    NO WBC SEEN ABUNDANT GRAM POSITIVE COCCI MODERATE GRAM NEGATIVE RODS MODERATE GRAM POSITIVE RODS Performed at [facility], [place] [address] [place], [place]  [address]    Culture   Final    NORMAL SKIN FLORA MIXED ANAEROBIC FLORA PRESENT.  CALL LAB IF FURTHER IID REQUIRED.    Report Status 08/23/2018 FINAL  Final    RADIOLOGY STUDIES/RESULTS: Ct Abdomen Pelvis Wo Contrast  Result Date: 08/13/2018 CLINICAL DATA:  Groin swelling EXAM: CT ABDOMEN AND PELVIS WITHOUT CONTRAST TECHNIQUE: Multidetector CT imaging of the abdomen and pelvis was performed following the standard protocol without IV contrast. COMPARISON:  MRI 07/10/2018 FINDINGS: Lower chest: Lung bases demonstrate no acute consolidation or effusion. The heart size is normal. Small hiatal hernia. Hepatobiliary: No focal liver abnormality is seen. No gallstones, gallbladder wall thickening, or biliary dilatation. Pancreas: Unremarkable. No pancreatic ductal dilatation or surrounding inflammatory changes. Spleen: Normal in size without focal abnormality. Adrenals/Urinary Tract: Adrenal glands are within normal limits. No hydronephrosis. Thick-walled urinary bladder Stomach/Bowel: Stomach is nonenlarged. No dilated small bowel. Extensive gas collection Between the rectum and prostate. Vascular/Lymphatic: Mild aortic atherosclerosis. No aneurysm. No significantly enlarged lymph nodes. Reproductive: Metallic densities along the posterior aspect of the prostate. Large gas collections around the prostate gland. Moderate edema of the scrotum and around the penis. Other: No free air. Fat in the left inguinal canal. Edema within the pubic soft tissues. Gas and fluid collection extending from the rectal area and along both sides of the prostate gland. Musculoskeletal: No acute or suspicious abnormality. IMPRESSION: 1. Moderate edema and mild inflammatory changes involving the scrotum and soft tissues about the penis, but without soft tissue emphysema. Mild edema within the pubic soft tissues. 2. Abnormal gas and fluid collection between the rectum and prostate gland and extending along the right and left aspects of the  prostate gland, felt to correspond to previously demonstrated rectal ulcer and sinus tracks. 3. Thick-walled  appearance of the urinary bladder, possible cystitis Electronically Signed   By: Donavan Foil M.D.   On: 08/13/2018 23:19   Dg Chest 2 View  Result Date: 08/13/2018 CLINICAL DATA:  Acute onset of genital swelling. Fever. EXAM: CHEST - 2 VIEW COMPARISON:  Chest radiograph performed 07/07/2018 FINDINGS: The lungs are well-aerated. Minimal left basilar atelectasis is noted. There is no evidence of pleural effusion or pneumothorax. The heart is normal in size; the mediastinal contour is within normal limits. No acute osseous abnormalities are seen. IMPRESSION: Minimal left basilar atelectasis noted. Lungs otherwise clear. Electronically Signed   By: Garald Balding M.D.   On: 08/13/2018 22:16   Ct Pelvis Wo Contrast  Result Date: 08/17/2018 CLINICAL DATA:  Proctitis with micro perforation, now with abscess at penile shaft, worsening pelvic swelling EXAM: CT PELVIS WITHOUT CONTRAST TECHNIQUE: Multidetector CT imaging of the pelvis was performed following the standard protocol without intravenous contrast. COMPARISON:  CT abdomen/pelvis dated 09/09/2018. MR pelvis dated 07/10/2018. FINDINGS: Motion degraded images. Urinary Tract: Thick-walled bladder with indwelling Foley catheter and nondependent gas. Bowel: Visualized bowel is poorly evaluated but grossly unremarkable. Vascular/Lymphatic: Mild atherosclerotic calcifications of the bilateral iliac vessels. No suspicious pelvic lymphadenopathy. Reproductive: Gas within the bilateral seminal vesicles. Additional gas along the retro prostatic soft tissues (series 3/image 37) and extending along the right pelvic sidewall (series 3/image 31), unchanged. Fiducial radiation markers along the posterior prostate. One of these markers is now displaced posterior to the rectum (series 3/image 29), new from recent CT. Other: Subcutaneous stranding along the anterior  lower pelvic wall, left greater than right (series 3/image 32), progressive. Additional fluid/stranding along the proximal penile shaft (series 3/image 54) with diffuse scrotal edema (series 3/image 68). This appearance is compatible with cellulitis. No drainable fluid collection/abscess on CT. Musculoskeletal: Visualized osseous structures are within normal limits. IMPRESSION: Gas along the bilateral seminal vesicles and retro prostatic soft tissues, likely related to known rectal microperforation, unchanged. Associated cellulitis involving the lower pelvic wall, penile shaft, and scrotum. No drainable fluid collection/abscess on CT. Displaced fiducial radiation marker now posterior to the rectum, new from recent CT. Electronically Signed   By: Julian Hy M.D.   On: 08/17/2018 11:34   US Renal  Result Date: 08/14/2018 CLINICAL DATA:  Acute renal failure. EXAM: RENAL / URINARY TRACT ULTRASOUND COMPLETE COMPARISON:  Noncontrast CT yesterday. FINDINGS: Right Kidney: Renal measurements: 12.6 x 5.2 x 6.1 cm = volume: 207 mL . Echogenicity within normal limits. No mass or hydronephrosis visualized. Left Kidney: Renal measurements: 12.8 x 5.1 x 5.7 cm = volume: 195 mL. Echogenicity within normal limits. No mass or hydronephrosis visualized. Bladder: Bladder wall thickening of 9 mm, as seen on CT. IMPRESSION: 1. Bladder wall thickening, similar to CT yesterday. 2. Unremarkable sonographic appearance of both kidneys. No hydronephrosis. Electronically Signed   By: Keith Rake M.D.   On: 08/14/2018 03:48   US Scrotum W/doppler  Result Date: 08/13/2018 CLINICAL DATA:  Initial evaluation for acute testicular pain, swelling. EXAM: SCROTAL ULTRASOUND DOPPLER ULTRASOUND OF THE TESTICLES TECHNIQUE: Complete ultrasound examination of the testicles, epididymis, and other scrotal structures was performed. Color and spectral Doppler ultrasound were also utilized to evaluate blood flow to the testicles. COMPARISON:   None. FINDINGS: Right testicle Measurements: 3.5 x 2.5 x 1.9 cm. No mass lesion. Testicular microlithiasis noted. Left testicle Measurements: 3.9 x 2.2 x 2.3 cm. No mass lesion. Testicular microlithiasis noted. Right epididymis:  Normal in size and appearance. Left epididymis: Diffusely increased vascularity  seen within the left epididymis, suggesting acute epididymitis. Hydrocele:  Bilateral hydroceles noted. Varicocele:  Bilateral varicoceles noted. Pulsed Doppler interrogation of both testes demonstrates normal low resistance arterial and venous waveforms bilaterally. Incidental note made of a probable small fat containing right inguinal hernia. IMPRESSION: 1. Increased vascularity within the left epididymis, suggesting acute epididymitis. No evidence for testicular torsion. 2. Small moderate bilateral hydroceles, likely reactive. 3. Small bilateral varicoceles. 4. Probable small fat containing right inguinal hernia. 5. Testicular microlithiasis. Current literature suggests that testicular microlithiasis is not a significant independent risk factor for development of testicular carcinoma, and that follow up imaging is not warranted in the absence of other risk factors. Monthly testicular self-examination and annual physical exams are considered appropriate surveillance. If patient has other risk factors for testicular carcinoma, then referral to Urology should be considered. (Reference: DeCastro, et al.: A 5-Year Follow up Study of Asymptomatic Men with Testicular Microlithiasis. J Urol 2008; [id number].) Electronically Signed   By: Jeannine Boga M.D.   On: 08/13/2018 22:48   US Abdomen Limited Ruq  Result Date: 08/14/2018 CLINICAL DATA:  Initial evaluation for elevated AST with leukocytosis. EXAM: ULTRASOUND ABDOMEN LIMITED RIGHT UPPER QUADRANT COMPARISON:  None. FINDINGS: Gallbladder: No gallstones or wall thickening visualized. No sonographic Murphy sign noted by sonographer. Common bile duct:  Diameter: 4.6 mm Liver: No focal lesion identified. Within normal limits in parenchymal echogenicity. Portal vein is patent on color Doppler imaging with normal direction of blood flow towards the liver. IMPRESSION: Normal right upper quadrant ultrasound. No cholelithiasis, evidence for acute cholecystitis, or biliary dilatation. Electronically Signed   By: Jeannine Boga M.D.   On: 08/14/2018 03:43     LOS: 13 days   Oren Binet, MD  [facility]  If 7PM-7AM, please contact night-coverage  Please page via [email or web address]-click on MD name and type text message  08/27/2018, 1:15 PM

## 2018-08-27 NOTE — Progress Notes (Addendum)
Physical Therapy Treatment Patient Details Name: Phillip Blackwell MRN: [id number] DOB: May 23, 1961 Today's Date: 08/27/2018    History of Present Illness Pt is a 58 y.o. male with medical history significant of anxiety, depression, GERD, hypertension, CVA, OSA presenting to the hospital for evaluation of groin pain and swelling , fever, blood in stool, work-up was significant for sepsis, acute blood loss anemia, is admitted for further work-up, this is secondary to colitis/prostatitis/epididymitis from perforated rectal ulcer.  Pt is s/p ostomy and muktiple I and D to penis/scrotal area.     PT Comments    Pt motivated to progress gait today.  Pt is heavily reliant on device and this was not his baseline.  He continues to benefit from CIR therapies to improve strength and function before returning home.  Based on his progress and motivation he has the ability to return to baseline with aggressive CIR therapies.  Pt cannot manage at home in his current state and will require RW and hospital bed to return home as he heavily relies on adjusting height of bed to stand and sit.  Plan next session for transfers from standard seat height to assess functional limitations.     Follow Up Recommendations  CIR;Supervision/Assistance - 24 hour     Equipment Recommendations  None recommended by PT    Recommendations for Other Services       Precautions / Restrictions Precautions Precautions: Fall Restrictions Weight Bearing Restrictions: No    Mobility  Bed Mobility Overal bed mobility: Needs Assistance Bed Mobility: Supine to Sit;Sit to Supine     Supine to sit: Min guard Sit to supine: Min guard   General bed mobility comments: Pt performing transfer with decreased assistance.    PTA managed lines/leads and min guard assistance provided for safety.  HOB elevated and height of bed elevated to come to edge of bed.  To return to bed lowered bed to allow for ease returning B LEs to supine.     Transfers Overall transfer level: Needs assistance Equipment used: Rolling walker (2 wheeled) Transfers: Sit to/from Stand Sit to Stand: Min guard  Bed placed significantly high due to scrotal pain as he cannot stand from standard seat height without physical assistance.         General transfer comment: Cues for hand placement.  Pt remains unable to place both feet on the floor before standing due to scrotal pain.  Once in standing he is able to decrease BOS significantly.    Ambulation/Gait Ambulation/Gait assistance: Min guard Gait Distance (Feet): 180 Feet Assistive device: Rolling walker (2 wheeled) Gait Pattern/deviations: Step-through pattern;Shuffle;Decreased stride length;Decreased step length - right;Decreased step length - left     General Gait Details: Cues for upper trunk control, increasing B step length and increasing B step height.  As patient fatigues he begins to shuffle more.  Adjusted RW to improve upper trunk posture.     Stairs             Wheelchair Mobility    Modified Rankin (Stroke Patients Only)       Balance Overall balance assessment: Needs assistance Sitting-balance support: Feet supported;Bilateral upper extremity supported Sitting balance-Leahy Scale: Fair Sitting balance - Comments: R lateral lean secondary to pain   Standing balance support: Bilateral upper extremity supported Standing balance-Leahy Scale: Fair Standing balance comment: UE support for balance in standing  Cognition Arousal/Alertness: Awake/alert Behavior During Therapy: WFL for tasks assessed/performed Overall Cognitive Status: Within Functional Limits for tasks assessed                                        Exercises General Exercises - Lower Extremity Ankle Circles/Pumps: AROM;15 reps;Both;Supine Quad Sets: AROM;Supine;Both;10 reps Heel Slides: Supine;Both;10 reps;AROM Hip ABduction/ADduction:  AAROM;Supine;Both;AROM;10 reps Straight Leg Raises: AAROM;Supine;Both;Limitations;10 reps Straight Leg Raises Limitations: extensor lag due to quad weakness.    General Comments        Pertinent Vitals/Pain Pain Assessment: 0-10 Pain Score: 7  Pain Location: scrotal and rectal pain, denies stomach pain.   Pain Descriptors / Indicators: Aching Pain Intervention(s): Monitored during session;Repositioned    Home Living                      Prior Function            PT Goals (current goals can now be found in the care plan section) Acute Rehab PT Goals Patient Stated Goal: to get stronger Potential to Achieve Goals: Good Progress towards PT goals: Progressing toward goals    Frequency    Min 3X/week      PT Plan Current plan remains appropriate    Co-evaluation              AM-PAC PT "6 Clicks" Mobility   Outcome Measure  Help needed turning from your back to your side while in a flat bed without using bedrails?: A Little Help needed moving from lying on your back to sitting on the side of a flat bed without using bedrails?: A Little Help needed moving to and from a bed to a chair (including a wheelchair)?: A Little Help needed standing up from a chair using your arms (e.g., wheelchair or bedside chair)?: A Little Help needed to walk in hospital room?: A Little Help needed climbing 3-5 steps with a railing? : A Little 6 Click Score: 18    End of Session Equipment Utilized During Treatment: Gait belt Activity Tolerance: Patient limited by fatigue;Patient limited by lethargy Patient left: in bed;with call bell/phone within reach Nurse Communication: Mobility status PT Visit Diagnosis: Muscle weakness (generalized) (M62.81);Difficulty in walking, not elsewhere classified (R26.2)     Time: [id number] PT Time Calculation (min) (ACUTE ONLY): 23 min  Charges:  $Gait Training: 8-22 mins $Therapeutic Exercise: 8-22 mins                     [name], PTA Acute Rehabilitation Services Pager [phone] Office [phone]     Phillip Blackwell 08/27/2018, 4:02 PM

## 2018-08-27 NOTE — Anesthesia Postprocedure Evaluation (Signed)
Anesthesia Post Note  Patient: Phillip Blackwell  Procedure(s) Performed: THIRD STAGE PENIS AND SCROTAL IRRIGATION AND  DEBRIDEMENT (N/A Scrotum)     Patient location during evaluation: PACU Anesthesia Type: General Level of consciousness: awake Pain management: pain level controlled Vital Signs Assessment: post-procedure vital signs reviewed and stable Respiratory status: spontaneous breathing Postop Assessment: no apparent nausea or vomiting Anesthetic complications: no    Last Vitals:  Vitals:   08/27/18 0437 08/27/18 0822  BP: (!) 152/73 (!) 153/87  Pulse: 63 75  Resp: 18   Temp: 36.6 C   SpO2: 99%     Last Pain:  Vitals:   08/27/18 0841  TempSrc:   PainSc: 8                  Lizvet Chunn

## 2018-08-28 LAB — BASIC METABOLIC PANEL
Anion gap: 9 (ref 5–15)
BUN: 12 mg/dL (ref 6–20)
CO2: 24 mmol/L (ref 22–32)
Calcium: 8.2 mg/dL — ABNORMAL LOW (ref 8.9–10.3)
Chloride: 107 mmol/L (ref 98–111)
Creatinine, Ser: 1.1 mg/dL (ref 0.61–1.24)
GFR calc Af Amer: >60 mL/min (ref >60)
GFR calc non Af Amer: >60 mL/min (ref >60)
GLUCOSE: 106 mg/dL — AB (ref 70–99)
Potassium: 3.9 mmol/L (ref 3.5–5.1)
Sodium: 140 mmol/L (ref 135–145)

## 2018-08-28 LAB — CBC
HCT: 27.5 % — ABNORMAL LOW (ref 39.0–52.0)
Hemoglobin: 8.4 g/dL — ABNORMAL LOW (ref 13.0–17.0)
MCH: 28 pg (ref 26.0–34.0)
MCHC: 30.5 g/dL (ref 30.0–36.0)
MCV: 91.7 fL (ref 80.0–100.0)
Platelets: 428 10*3/uL — ABNORMAL HIGH (ref 150–400)
RBC: 3 MIL/uL — ABNORMAL LOW (ref 4.22–5.81)
RDW: 14.8 % (ref 11.5–15.5)
WBC: 10.5 10*3/uL (ref 4.0–10.5)
nRBC: 0 % (ref 0.0–0.2)

## 2018-08-28 NOTE — Progress Notes (Signed)
PROGRESS NOTE        PATIENT DETAILS Name: Phillip Blackwell Age: 58 y.o. Sex: male Date of Birth: 11-06-1960 Admit Date: 08/13/2018 Admitting Physician Shela Leff, MD CWU:[id number], Pcp Not In  Brief Narrative: Patient is a 58 y.o. male with history of hypertension, BPH, OSA prostate cancer status post external beam radiation-presented with sepsis secondary to rectal ulcer with perforation with seeding of the GU tract causing epididymitis/prostatitis.  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 Further hospital course complicated by worsening penile/scrotal infection and AKI.  Evaluated by general surgery and urology-underwent diverting colostomy, and has had multiple trips to the OR for scrotal debridement.  See below for further details   Subjective: Patient in bed, appears comfortable, denies any headache, no fever, no chest pain or pressure, no shortness of breath , no abdominal pain. No focal weakness.  Assessment/Plan:  Severe sepsis secondary to perforated rectal ulcer with prostatitis/epididymitis and penoscrotal abscess along with eggerthella lenta bacteremia: Sepsis pathophysiology has resolved, he is also undergone diverting colostomy which appears stable, seen by urology underwent scrotal debridement on 08/23/2018 along with 08/27/2018, I discussed the case with urologist Dr. Tammi Klippel on 08/28/2018 he will require at least 1 more OR visit.  Previous MD had discussed the case with ID physician Dr. Megan Salon.  He has been maintained on IV Unasyn.  He will require total of 3 weeks of antibiotics once stable from urology standpoint will be transition to oral Augmentin.  He may require to see plastic surgery as well.  AKI: Hemodynamically mediated-likely ATN in the setting of sepsis.  Renal function has markedly improved-follow electrolytes periodically.   Multifactorial anemia secondary to acute blood loss anemia and secondary to acute/critical  illness: No further hematochezia-hemoglobin levels are relatively stable-no further hematochezia, suspicion for acute illness causing him to have persistent anemia rather than further GI bleeding.  Has been transfused 1 unit of PRBC so far, follow CBC periodically.   Hyperbilirubinemia: Appears to be mostly direct hyperbilirubinemia-no biliary dilatation seen on CT scan.  Bilirubin has normalized-not sure if this was all secondary to sepsis.  Follow periodically.    Hypertension: Controlled-continue metoprolol, prazosin.   Dyslipidemia: Continue statin  BPH: Continue Flomax  OSA: CPAP nightly-but per RT note-patient has been refusing lately-we will counsel  Moderate protein calorie malnutrition: Continue supplements  Acute debility/deconditioning: Likely secondary to acute illness-initially plans were for SNF placement-however patient refusing SNF and wants to go home.  Will need maximum home health services on discharge  DVT Prophylaxis: SCD's  Code Status: Full code  Family Communication: Spouse at bedside  Disposition Plan: Remain inpatient-requires several more days of hospitalization before consideration of discharge  Antimicrobial agents: Anti-infectives (From admission, onward)   Start     Dose/Rate Route Frequency Ordered Stop   08/23/18 1500  Ampicillin-Sulbactam (UNASYN) 3 g in sodium chloride 0.9 % 100 mL IVPB     3 g 200 mL/hr over 30 Minutes Intravenous Every 6 hours 08/23/18 1434     08/23/18 0845  Ampicillin-Sulbactam (UNASYN) 3 g in sodium chloride 0.9 % 100 mL IVPB     3 g 200 mL/hr over 30 Minutes Intravenous To [facility] 08/23/18 0839 08/23/18 1018   08/20/18 2200  ceFEPIme (MAXIPIME) 2 g in sodium chloride 0.9 % 100 mL IVPB  Status:  Discontinued     2 g 200  mL/hr over 30 Minutes Intravenous Every 12 hours 08/20/18 1315 08/20/18 1406   08/20/18 1500  Ampicillin-Sulbactam (UNASYN) 3 g in sodium chloride 0.9 % 100 mL IVPB  Status:  Discontinued      3 g 200 mL/hr over 30 Minutes Intravenous Every 6 hours 08/20/18 1420 08/23/18 0839   08/17/18 1400  metroNIDAZOLE (FLAGYL) tablet 500 mg  Status:  Discontinued     500 mg Oral Every 8 hours 08/17/18 1303 08/20/18 1406   08/16/18 2000  ceFEPIme (MAXIPIME) 2 g in sodium chloride 0.9 % 100 mL IVPB  Status:  Discontinued     2 g 200 mL/hr over 30 Minutes Intravenous Every 24 hours 08/16/18 1328 08/20/18 1315   08/15/18 1030  vancomycin (VANCOCIN) 1,250 mg in sodium chloride 0.9 % 250 mL IVPB     1,250 mg 166.7 mL/hr over 90 Minutes Intravenous  Once 08/15/18 0942 08/15/18 1444   08/14/18 2000  ceFEPIme (MAXIPIME) 1 g in sodium chloride 0.9 % 100 mL IVPB  Status:  Discontinued     1 g 200 mL/hr over 30 Minutes Intravenous Every 24 hours 08/13/18 2052 08/16/18 1328   08/13/18 2052  vancomycin variable dose per unstable renal function (pharmacist dosing)  Status:  Discontinued      Does not apply See admin instructions 08/13/18 2052 08/16/18 1442   08/13/18 2045  ceFEPIme (MAXIPIME) 2 g in sodium chloride 0.9 % 100 mL IVPB     2 g 200 mL/hr over 30 Minutes Intravenous  Once 08/13/18 2038 08/13/18 2141   08/13/18 2045  metroNIDAZOLE (FLAGYL) IVPB 500 mg  Status:  Discontinued     500 mg 100 mL/hr over 60 Minutes Intravenous Every 8 hours 08/13/18 2038 08/17/18 1303   08/13/18 2045  vancomycin (VANCOCIN) IVPB 1000 mg/200 mL premix     1,000 mg 200 mL/hr over 60 Minutes Intravenous  Once 08/13/18 2038 08/13/18 2333      Procedures: None  CONSULTS:  general surgery and urology  Time spent: 25 minutes-Greater than 50% of this time was spent in counseling, explanation of diagnosis, planning of further management, and coordination of care.  MEDICATIONS: Scheduled Meds: . sodium chloride   Intravenous Once  . atorvastatin  40 mg Oral Q supper  . cholecalciferol  2,000 Units Oral Daily  . feeding supplement  1 Container Oral BID BM  . feeding supplement (PRO-STAT SUGAR FREE 64)  30 mL Oral  BID  . ferrous sulfate  325 mg Oral Q breakfast  . metoprolol tartrate  100 mg Oral BID  . multivitamin with minerals  1 tablet Oral Daily  . mupirocin ointment   Nasal BID  . pantoprazole  40 mg Oral BID AC  . polycarbophil  625 mg Oral BID  . prazosin  2 mg Oral QHS   Continuous Infusions: . sodium chloride 100 mL (08/26/18 2126)  . ampicillin-sulbactam (UNASYN) IV 3 g (08/28/18 0907)  . lactated ringers 10 mL/hr at 08/18/18 2212  . lactated ringers 10 mL/hr at 08/25/18 1729   PRN Meds:.sodium chloride, acetaminophen **OR** acetaminophen, clonazePAM, ondansetron (ZOFRAN) IV, oxyCODONE, simethicone, tamsulosin   PHYSICAL EXAM: Vital signs: Vitals:   08/27/18 0822 08/27/18 2133 08/28/18 0547 08/28/18 0859  BP: (!) 153/87 (!) 149/75 (!) 150/69 (!) 155/79  Pulse: 75 61 67 68  Resp:  18 18   Temp:  98 F (36.7 C) 97.8 F (36.6 C)   TempSrc:  Oral Oral   SpO2:  100% 98%   Weight:  Height:       Filed Weights   08/13/18 1922 08/13/18 2234 08/26/18 1436  Weight: 86.2 kg 86.2 kg 86 kg   Body mass index is 25.71 kg/m.   Exam  Awake Alert, Oriented X 3, No new F.N deficits, Normal affect [place].AT,PERRAL Supple Neck,No JVD, No cervical lymphadenopathy appriciated.  Symmetrical Chest wall movement, Good air movement bilaterally, CTAB RRR,No Gallops, Rubs or new Murmurs, No Parasternal Heave +ve B.Sounds, Abd Soft, No tenderness, No organomegaly appriciated, No rebound - guarding or rigidity. No Cyanosis, Clubbing or edema, Foley catheter in place, scrotal postop site under bandage.   I have personally reviewed following labs and imaging studies  LABORATORY DATA: CBC: Recent Labs  Lab 08/23/18 0407 08/24/18 0324 08/26/18 0301 08/26/18 1618 08/27/18 0430 08/28/18 0452  WBC 9.1 8.8 9.8  --  11.7* 10.5  HGB 8.1* 8.4* 7.8* 8.5* 8.7* 8.4*  HCT 26.7* 26.5* 24.3* 25.0* 27.1* 27.5*  MCV 90.5 90.8 90.3  --  92.5 91.7  PLT 625* 601* 499*  --  455* 428*    Basic  Metabolic Panel: Recent Labs  Lab 08/23/18 0407 08/24/18 0324 08/26/18 0301 08/26/18 1618 08/27/18 0430 08/28/18 0452  NA 136 137 136 139 138 140  K 3.9 4.3 3.9 3.9 4.8 3.9  CL 106 107 106  --  105 107  CO2 23 24 23   --  25 24  GLUCOSE 101* 138* 113* 93 135* 106*  BUN 9 10 11   --  13 12  CREATININE 1.33* 1.25* 1.27*  --  1.29* 1.10  CALCIUM 7.9* 8.1* 8.1*  --  8.2* 8.2*    GFR: Estimated Creatinine Clearance: 79.4 mL/min (by C-G formula based on SCr of 1.1 mg/dL).  Liver Function Tests: No results for input(s): AST, ALT, ALKPHOS, BILITOT, PROT, ALBUMIN in the last 168 hours. No results for input(s): LIPASE, AMYLASE in the last 168 hours. No results for input(s): AMMONIA in the last 168 hours.  Coagulation Profile: No results for input(s): INR, PROTIME in the last 168 hours.  Cardiac Enzymes: No results for input(s): CKTOTAL, CKMB, CKMBINDEX, TROPONINI in the last 168 hours.  BNP (last 3 results) No results for input(s): PROBNP in the last 8760 hours.  HbA1C: No results for input(s): HGBA1C in the last 72 hours.  CBG: No results for input(s): GLUCAP in the last 168 hours.  Lipid Profile: No results for input(s): CHOL, HDL, LDLCALC, TRIG, CHOLHDL, LDLDIRECT in the last 72 hours.  Thyroid Function Tests: No results for input(s): TSH, T4TOTAL, FREET4, T3FREE, THYROIDAB in the last 72 hours.  Anemia Panel: No results for input(s): VITAMINB12, FOLATE, FERRITIN, TIBC, IRON, RETICCTPCT in the last 72 hours.  Urine analysis:    Component Value Date/Time   COLORURINE AMBER (A) 08/13/2018 2010   APPEARANCEUR CLOUDY (A) 08/13/2018 2010   LABSPEC 1.014 08/13/2018 2010   PHURINE 5.0 08/13/2018 2010   GLUCOSEU NEGATIVE 08/13/2018 2010   HGBUR NEGATIVE 08/13/2018 2010   [place] NEGATIVE 08/13/2018 2010   Derma NEGATIVE 08/13/2018 2010   PROTEINUR 30 (A) 08/13/2018 2010   NITRITE NEGATIVE 08/13/2018 2010   LEUKOCYTESUR NEGATIVE 08/13/2018 2010    Sepsis  Labs: Lactic Acid, Venous    Component Value Date/Time   LATICACIDVEN 1.95 (H) 08/13/2018 2313    MICROBIOLOGY: Recent Results (from the past 240 hour(s))  Aerobic/Anaerobic Culture (surgical/deep wound)     Status: Abnormal   Collection Time: 08/18/18  6:14 PM  Result Value Ref Range Status   Specimen Description TISSUE  Final   Special Requests PENOSCROTAL  Final   Gram Stain   Final    RARE WBC PRESENT, PREDOMINANTLY PMN ABUNDANT GRAM POSITIVE COCCI ABUNDANT GRAM NEGATIVE RODS ABUNDANT GRAM POSITIVE RODS Performed at [facility], [place] [address] [place], [place] [address]    Culture (A)  Final    MULTIPLE ORGANISMS PRESENT, NONE PREDOMINANT MIXED ANAEROBIC FLORA PRESENT.  CALL LAB IF FURTHER IID REQUIRED.    Report Status 08/23/2018 FINAL  Final  Aerobic/Anaerobic Culture (surgical/deep wound)     Status: None   Collection Time: 08/18/18  6:16 PM  Result Value Ref Range Status   Specimen Description ABSCESS  Final   Special Requests PENOSCROTAL SWABS  Final   Gram Stain   Final    NO WBC SEEN ABUNDANT GRAM POSITIVE COCCI MODERATE GRAM NEGATIVE RODS MODERATE GRAM POSITIVE RODS Performed at [facility], [place] [address], [place], [place] [address]    Culture   Final    NORMAL SKIN FLORA MIXED ANAEROBIC FLORA PRESENT.  CALL LAB IF FURTHER IID REQUIRED.    Report Status 08/23/2018 FINAL  Final    RADIOLOGY STUDIES/RESULTS: Ct Abdomen Pelvis Wo Contrast  Result Date: 08/13/2018 CLINICAL DATA:  Groin swelling EXAM: CT ABDOMEN AND PELVIS WITHOUT CONTRAST TECHNIQUE: Multidetector CT imaging of the abdomen and pelvis was performed following the standard protocol without IV contrast. COMPARISON:  MRI 07/10/2018 FINDINGS: Lower chest: Lung bases demonstrate no acute consolidation or effusion. The heart size is normal. Small hiatal hernia. Hepatobiliary: No focal liver abnormality is seen. No gallstones, gallbladder wall thickening, or biliary dilatation. Pancreas:  Unremarkable. No pancreatic ductal dilatation or surrounding inflammatory changes. Spleen: Normal in size without focal abnormality. Adrenals/Urinary Tract: Adrenal glands are within normal limits. No hydronephrosis. Thick-walled urinary bladder Stomach/Bowel: Stomach is nonenlarged. No dilated small bowel. Extensive gas collection Between the rectum and prostate. Vascular/Lymphatic: Mild aortic atherosclerosis. No aneurysm. No significantly enlarged lymph nodes. Reproductive: Metallic densities along the posterior aspect of the prostate. Large gas collections around the prostate gland. Moderate edema of the scrotum and around the penis. Other: No free air. Fat in the left inguinal canal. Edema within the pubic soft tissues. Gas and fluid collection extending from the rectal area and along both sides of the prostate gland. Musculoskeletal: No acute or suspicious abnormality. IMPRESSION: 1. Moderate edema and mild inflammatory changes involving the scrotum and soft tissues about the penis, but without soft tissue emphysema. Mild edema within the pubic soft tissues. 2. Abnormal gas and fluid collection between the rectum and prostate gland and extending along the right and left aspects of the prostate gland, felt to correspond to previously demonstrated rectal ulcer and sinus tracks. 3. Thick-walled appearance of the urinary bladder, possible cystitis Electronically Signed   By: Donavan Foil M.D.   On: 08/13/2018 23:19   Dg Chest 2 View  Result Date: 08/13/2018 CLINICAL DATA:  Acute onset of genital swelling. Fever. EXAM: CHEST - 2 VIEW COMPARISON:  Chest radiograph performed 07/07/2018 FINDINGS: The lungs are well-aerated. Minimal left basilar atelectasis is noted. There is no evidence of pleural effusion or pneumothorax. The heart is normal in size; the mediastinal contour is within normal limits. No acute osseous abnormalities are seen. IMPRESSION: Minimal left basilar atelectasis noted. Lungs otherwise clear.  Electronically Signed   By: Garald Balding M.D.   On: 08/13/2018 22:16   Ct Pelvis Wo Contrast  Result Date: 08/17/2018 CLINICAL DATA:  Proctitis with micro perforation, now with abscess at  penile shaft, worsening pelvic swelling EXAM: CT PELVIS WITHOUT CONTRAST TECHNIQUE: Multidetector CT imaging of the pelvis was performed following the standard protocol without intravenous contrast. COMPARISON:  CT abdomen/pelvis dated 09/09/2018. MR pelvis dated 07/10/2018. FINDINGS: Motion degraded images. Urinary Tract: Thick-walled bladder with indwelling Foley catheter and nondependent gas. Bowel: Visualized bowel is poorly evaluated but grossly unremarkable. Vascular/Lymphatic: Mild atherosclerotic calcifications of the bilateral iliac vessels. No suspicious pelvic lymphadenopathy. Reproductive: Gas within the bilateral seminal vesicles. Additional gas along the retro prostatic soft tissues (series 3/image 37) and extending along the right pelvic sidewall (series 3/image 31), unchanged. Fiducial radiation markers along the posterior prostate. One of these markers is now displaced posterior to the rectum (series 3/image 29), new from recent CT. Other: Subcutaneous stranding along the anterior lower pelvic wall, left greater than right (series 3/image 32), progressive. Additional fluid/stranding along the proximal penile shaft (series 3/image 54) with diffuse scrotal edema (series 3/image 68). This appearance is compatible with cellulitis. No drainable fluid collection/abscess on CT. Musculoskeletal: Visualized osseous structures are within normal limits. IMPRESSION: Gas along the bilateral seminal vesicles and retro prostatic soft tissues, likely related to known rectal microperforation, unchanged. Associated cellulitis involving the lower pelvic wall, penile shaft, and scrotum. No drainable fluid collection/abscess on CT. Displaced fiducial radiation marker now posterior to the rectum, new from recent CT. Electronically  Signed   By: Julian Hy M.D.   On: 08/17/2018 11:34   US Renal  Result Date: 08/14/2018 CLINICAL DATA:  Acute renal failure. EXAM: RENAL / URINARY TRACT ULTRASOUND COMPLETE COMPARISON:  Noncontrast CT yesterday. FINDINGS: Right Kidney: Renal measurements: 12.6 x 5.2 x 6.1 cm = volume: 207 mL . Echogenicity within normal limits. No mass or hydronephrosis visualized. Left Kidney: Renal measurements: 12.8 x 5.1 x 5.7 cm = volume: 195 mL. Echogenicity within normal limits. No mass or hydronephrosis visualized. Bladder: Bladder wall thickening of 9 mm, as seen on CT. IMPRESSION: 1. Bladder wall thickening, similar to CT yesterday. 2. Unremarkable sonographic appearance of both kidneys. No hydronephrosis. Electronically Signed   By: Keith Rake M.D.   On: 08/14/2018 03:48   US Scrotum W/doppler  Result Date: 08/13/2018 CLINICAL DATA:  Initial evaluation for acute testicular pain, swelling. EXAM: SCROTAL ULTRASOUND DOPPLER ULTRASOUND OF THE TESTICLES TECHNIQUE: Complete ultrasound examination of the testicles, epididymis, and other scrotal structures was performed. Color and spectral Doppler ultrasound were also utilized to evaluate blood flow to the testicles. COMPARISON:  None. FINDINGS: Right testicle Measurements: 3.5 x 2.5 x 1.9 cm. No mass lesion. Testicular microlithiasis noted. Left testicle Measurements: 3.9 x 2.2 x 2.3 cm. No mass lesion. Testicular microlithiasis noted. Right epididymis:  Normal in size and appearance. Left epididymis: Diffusely increased vascularity seen within the left epididymis, suggesting acute epididymitis. Hydrocele:  Bilateral hydroceles noted. Varicocele:  Bilateral varicoceles noted. Pulsed Doppler interrogation of both testes demonstrates normal low resistance arterial and venous waveforms bilaterally. Incidental note made of a probable small fat containing right inguinal hernia. IMPRESSION: 1. Increased vascularity within the left epididymis, suggesting acute  epididymitis. No evidence for testicular torsion. 2. Small moderate bilateral hydroceles, likely reactive. 3. Small bilateral varicoceles. 4. Probable small fat containing right inguinal hernia. 5. Testicular microlithiasis. Current literature suggests that testicular microlithiasis is not a significant independent risk factor for development of testicular carcinoma, and that follow up imaging is not warranted in the absence of other risk factors. Monthly testicular self-examination and annual physical exams are considered appropriate surveillance. If patient has other risk factors for  testicular carcinoma, then referral to Urology should be considered. (Reference: DeCastro, et al.: A 5-Year Follow up Study of Asymptomatic Men with Testicular Microlithiasis. J Urol 2008; 179:1505-6979.) Electronically Signed   By: Jeannine Boga M.D.   On: 08/13/2018 22:48   US Abdomen Limited Ruq  Result Date: 08/14/2018 CLINICAL DATA:  Initial evaluation for elevated AST with leukocytosis. EXAM: ULTRASOUND ABDOMEN LIMITED RIGHT UPPER QUADRANT COMPARISON:  None. FINDINGS: Gallbladder: No gallstones or wall thickening visualized. No sonographic Murphy sign noted by sonographer. Common bile duct: Diameter: 4.6 mm Liver: No focal lesion identified. Within normal limits in parenchymal echogenicity. Portal vein is patent on color Doppler imaging with normal direction of blood flow towards the liver. IMPRESSION: Normal right upper quadrant ultrasound. No cholelithiasis, evidence for acute cholecystitis, or biliary dilatation. Electronically Signed   By: Jeannine Boga M.D.   On: 08/14/2018 03:43     LOS: 14 days    Signature  Lala Lund M.D on 08/28/2018 at 12:24 PM   -  To page go to [email or web address]

## 2018-08-29 NOTE — Progress Notes (Signed)
3 Days Post-Op   Subjective/Chief Complaint:   1 - Prostate Cancer, Likely Recurrent - s/p primary external beam radiation completed 03/2017 in [place] for larger volume Gleason 6 disease. Initial PSA 5.5 and diagnosed 2017 by Dr. Alyson Ingles.  Recent PSA Course: 06/2017 - PSA 1.1 06/2018 - PSA 2.6   2 - Acute Renal Failure - Cr 7.2 / K 5.0 by ER labs 08/14/18 on eval sepsis. [place] and CT w/o hydro or distend bladder. Baseline Cr 1.1 as recently as 07/2018. Resolved back to baseline with hydration and conclusion of SIRS.   3 - Sepsis of GI/GU Origin with Prostatitis / Epididymitis / Colitis / Severe Penoscrotal Infection - lactate 3.8, WBC 42k by ET labs 08/14/18 on eval fevers and malaise. UA unremarkable, UCX negative. [facility]'[place] 1/3 [place] Scrotal US and Pelvic CT with inflmmation of epidydimis / prostate / peri-rectal gas c/w likely infection stemming from colon microperf seeding prostate and GU tract. Initially had good systemic response to IV ABX but worsened penoscrotal skin swelling and then some purlulent drainage 1/7, Repeat CT 1/7 with some progression of peri-rectal gas and new displacement of prior fiducial marker (confirming hole in rectum) and still no large fluid collections / sub-Q gas and exam with new draining sinus tracts at penoscrotal junction. 1st stage OR penoscrotal wound debridment 1/8, 3 large penrose drains place, approx 5cm2 non-viable skin removed. New Wound CX 1/8 - skin flora. +MRSA screen. Repeat debridement 1/13 and additional 4cm2 penile shaft tissue removed, drains replaced, end colostomy by Dr. Gershon Crane.    4 - Rectal Ulcer/Suspect Perforation - known DX rectal ulcer with periodic bleeding following prostate radiation 2018. Flex-Sig / BX 06/2018 of ulcer edges negative for malignancy. CT 08/14/18 with gas in peri-rectal / peri-prostatic space c/w likely microperforation. Lap end colostomy performed by gen surg 1/13.   Today he denies any worsening penile pain. He is having more  drainage from the dorsal aspect of his penile incision    Objective: Vital signs in last 24 hours: Temp:  [98.8 F (37.1 C)-99.8 F (37.7 C)] 99.5 F (37.5 C) (01/19 2150) Pulse Rate:  [68-81] 73 (01/19 2150) Resp:  [16-22] 16 (01/19 2150) BP: (137-158)/(65-83) 156/65 (01/19 2150) SpO2:  [95 %-100 %] 95 % (01/19 2150) Last BM Date: 08/28/18  Intake/Output from previous day: 01/18 0701 - 01/19 0700 In: 1807 [P.O.:1500; I.V.:107; IV Piggyback:200.1] Out: 2525 [Urine:2525] Intake/Output this shift: Total I/O In: -  Out: 650 [Urine:650]   EXAM: NAD in pre-op holding, family at bedside.  Head: Normocephalic.  Eyes: Pupils are equal, round, and reactive to light.  Neck: Normal range of motion.  Cardiovascular: regular rate.  Respiratory:  Non-labored on room air GI: Soft. There is no abdominal tenderness. There is no rebound and no guarding. LLQ with some green liquid and gas.  Genitourinary:    Genitourinary Comments: Recent surgical drians with improved but persistent purlulence and local swelling of penis, scrotum, perineum. Musculoskeletal: Normal range of motion.  Neurological: He is alert.  Skin: Skin is warm.  Psychiatric: He has a normal mood and affect. His behavior is normal.  Lab Results:  Recent Labs    08/27/18 0430 08/28/18 0452  WBC 11.7* 10.5  HGB 8.7* 8.4*  HCT 27.1* 27.5*  PLT 455* 428*   BMET Recent Labs    08/27/18 0430 08/28/18 0452  NA 138 140  K 4.8 3.9  CL 105 107  CO2 25 24  GLUCOSE 135* 106*  BUN 13 12  CREATININE 1.29* 1.10  CALCIUM 8.2* 8.2*   PT/INR No results for input(s): LABPROT, INR in the last 72 hours. ABG No results for input(s): PHART, HCO3 in the last 72 hours.  Invalid input(s): PCO2, PO2  Studies/Results: No results found.  Anti-infectives: Anti-infectives (From admission, onward)   Start     Dose/Rate Route Frequency Ordered Stop   08/23/18 1500  Ampicillin-Sulbactam (UNASYN) 3 g in sodium chloride 0.9 %  100 mL IVPB     3 g 200 mL/hr over 30 Minutes Intravenous Every 6 hours 08/23/18 1434     08/23/18 0845  Ampicillin-Sulbactam (UNASYN) 3 g in sodium chloride 0.9 % 100 mL IVPB     3 g 200 mL/hr over 30 Minutes Intravenous To ShortStay Surgical 08/23/18 0839 08/23/18 1018   08/20/18 2200  ceFEPIme (MAXIPIME) 2 g in sodium chloride 0.9 % 100 mL IVPB  Status:  Discontinued     2 g 200 mL/hr over 30 Minutes Intravenous Every 12 hours 08/20/18 1315 08/20/18 1406   08/20/18 1500  Ampicillin-Sulbactam (UNASYN) 3 g in sodium chloride 0.9 % 100 mL IVPB  Status:  Discontinued     3 g 200 mL/hr over 30 Minutes Intravenous Every 6 hours 08/20/18 1420 08/23/18 0839   08/17/18 1400  metroNIDAZOLE (FLAGYL) tablet 500 mg  Status:  Discontinued     500 mg Oral Every 8 hours 08/17/18 1303 08/20/18 1406   08/16/18 2000  ceFEPIme (MAXIPIME) 2 g in sodium chloride 0.9 % 100 mL IVPB  Status:  Discontinued     2 g 200 mL/hr over 30 Minutes Intravenous Every 24 hours 08/16/18 1328 08/20/18 1315   08/15/18 1030  vancomycin (VANCOCIN) 1,250 mg in sodium chloride 0.9 % 250 mL IVPB     1,250 mg 166.7 mL/hr over 90 Minutes Intravenous  Once 08/15/18 0942 08/15/18 1444   08/14/18 2000  ceFEPIme (MAXIPIME) 1 g in sodium chloride 0.9 % 100 mL IVPB  Status:  Discontinued     1 g 200 mL/hr over 30 Minutes Intravenous Every 24 hours 08/13/18 2052 08/16/18 1328   08/13/18 2052  vancomycin variable dose per unstable renal function (pharmacist dosing)  Status:  Discontinued      Does not apply See admin instructions 08/13/18 2052 08/16/18 1442   08/13/18 2045  ceFEPIme (MAXIPIME) 2 g in sodium chloride 0.9 % 100 mL IVPB     2 g 200 mL/hr over 30 Minutes Intravenous  Once 08/13/18 2038 08/13/18 2141   08/13/18 2045  metroNIDAZOLE (FLAGYL) IVPB 500 mg  Status:  Discontinued     500 mg 100 mL/hr over 60 Minutes Intravenous Every 8 hours 08/13/18 2038 08/17/18 1303   08/13/18 2045  vancomycin (VANCOCIN) IVPB 1000 mg/200 mL  premix     1,000 mg 200 mL/hr over 60 Minutes Intravenous  Once 08/13/18 2038 08/13/18 2333      Assessment/Plan:  1 - Prostate Cancer, Likely Recurrent - PSA pattern most c/w active disease, but no signs of metastatic involvement. May warrant tissue sampling via transperineal approach in elective setting. No further diagnostics or intervention this admission.   2 - Acute Renal Failure - Resolved,  likely pre-renal due to sepsis. No hydro to warrant stenting / neph tubes. Rec continued foley for accurate UOP monitoring.  3 - Sepsis of GI/GU Origin with Prostatitis / Epididymitis / Colitis / Severe Penoscrotal Infection - Improving sytemically and locally on IV ABX and s/p staged local wound debridement and end colostomy.   The patient  will likely need a fourth debridement of his penile skin   Will consult plastics once clears infectious paramaters of wound.  4 - Rectal Ulcer/Suspect Perforation - likely from progression of known radiation-induced rectal ulcer. Now s/p fecal diversion which should drastically (but likely not completely) reduce output across perforation.   WIll follow, please call me directly anytime with questions.   Nicolette Bang 08/29/2018

## 2018-08-29 NOTE — Progress Notes (Signed)
PROGRESS NOTE        PATIENT DETAILS Name: Phillip Blackwell Age: 58 y.o. Sex: male Date of Birth: [birth year] Admit Date: 08/13/2018 Admitting Physician Shela Leff, MD NID:[id number], Pcp Not In  Brief Narrative: Patient is a 58 y.o. male with history of hypertension, BPH, OSA prostate cancer status post external beam radiation-presented with sepsis secondary to rectal ulcer with perforation with seeding of the GU tract causing epididymitis/prostatitis.  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 Further hospital course complicated by worsening penile/scrotal infection and AKI.  Evaluated by general surgery and urology-underwent diverting colostomy, and has had multiple trips to the OR for scrotal debridement.  See below for further details   Subjective:  Patient in bed, appears comfortable, denies any headache, no fever, no chest pain or pressure, no shortness of breath , no abdominal pain. No focal weakness.   Assessment/Plan:  Severe sepsis secondary to perforated rectal ulcer with prostatitis/epididymitis and penoscrotal abscess along with eggerthella lenta bacteremia: Sepsis pathophysiology has resolved, he is also undergone diverting colostomy which appears stable, seen by urology underwent scrotal debridement on 08/23/2018 along with 08/27/2018, I discussed the case with urologist Dr. Tammi Klippel on 08/28/2018 he will require at least 1 more OR visit.  Previous MD had discussed the case with ID physician Dr. Megan Salon.  He has been maintained on IV Unasyn.  He will require total of 3 weeks of antibiotics once stable from urology standpoint will be transition to oral Augmentin.  He may require to see plastic surgery as well.  AKI: This was hemodynamically mediated ATN which has resolved after supportive care and hydration.   Multifactorial anemia secondary to acute blood loss anemia and secondary to acute/critical illness: No further hematochezia-hemoglobin  levels are relatively stable-no further hematochezia, suspicion for acute illness causing him to have persistent anemia rather than further GI bleeding.  Has been transfused 1 unit of PRBC so far, follow CBC periodically.   Hyperbilirubinemia: Due to sepsis resolved, CT scan unremarkable from hepatobiliary standpoint.       Hypertension: Controlled-continue metoprolol, prazosin.   Dyslipidemia: Continue statin  BPH: Continue Flomax  OSA: CPAP nightly-but per RT note-patient has been refusing lately-we will counsel  Moderate protein calorie malnutrition: Continue supplements  Acute debility/deconditioning: Likely secondary to acute illness-initially plans were for SNF placement-however patient refusing SNF and wants to go home.  Will need maximum home health services on discharge  DVT Prophylaxis: SCD's  Code Status: Full code  Family Communication: Wife bedside on 08/29/2018, updated with the plan.  Disposition Plan: Remain inpatient-requires several more days of hospitalization before consideration of discharge  Antimicrobial agents: Anti-infectives (From admission, onward)   Start     Dose/Rate Route Frequency Ordered Stop   08/23/18 1500  Ampicillin-Sulbactam (UNASYN) 3 g in sodium chloride 0.9 % 100 mL IVPB     3 g 200 mL/hr over 30 Minutes Intravenous Every 6 hours 08/23/18 1434     08/23/18 0845  Ampicillin-Sulbactam (UNASYN) 3 g in sodium chloride 0.9 % 100 mL IVPB     3 g 200 mL/hr over 30 Minutes Intravenous To [facility] 08/23/18 0839 08/23/18 1018   08/20/18 2200  ceFEPIme (MAXIPIME) 2 g in sodium chloride 0.9 % 100 mL IVPB  Status:  Discontinued     2 g 200 mL/hr over 30 Minutes Intravenous Every 12 hours 08/20/18 1315  08/20/18 1406   08/20/18 1500  Ampicillin-Sulbactam (UNASYN) 3 g in sodium chloride 0.9 % 100 mL IVPB  Status:  Discontinued     3 g 200 mL/hr over 30 Minutes Intravenous Every 6 hours 08/20/18 1420 08/23/18 0839   08/17/18 1400   metroNIDAZOLE (FLAGYL) tablet 500 mg  Status:  Discontinued     500 mg Oral Every 8 hours 08/17/18 1303 08/20/18 1406   08/16/18 2000  ceFEPIme (MAXIPIME) 2 g in sodium chloride 0.9 % 100 mL IVPB  Status:  Discontinued     2 g 200 mL/hr over 30 Minutes Intravenous Every 24 hours 08/16/18 1328 08/20/18 1315   08/15/18 1030  vancomycin (VANCOCIN) 1,250 mg in sodium chloride 0.9 % 250 mL IVPB     1,250 mg 166.7 mL/hr over 90 Minutes Intravenous  Once 08/15/18 0942 08/15/18 1444   08/14/18 2000  ceFEPIme (MAXIPIME) 1 g in sodium chloride 0.9 % 100 mL IVPB  Status:  Discontinued     1 g 200 mL/hr over 30 Minutes Intravenous Every 24 hours 08/13/18 2052 08/16/18 1328   08/13/18 2052  vancomycin variable dose per unstable renal function (pharmacist dosing)  Status:  Discontinued      Does not apply See admin instructions 08/13/18 2052 08/16/18 1442   08/13/18 2045  ceFEPIme (MAXIPIME) 2 g in sodium chloride 0.9 % 100 mL IVPB     2 g 200 mL/hr over 30 Minutes Intravenous  Once 08/13/18 2038 08/13/18 2141   08/13/18 2045  metroNIDAZOLE (FLAGYL) IVPB 500 mg  Status:  Discontinued     500 mg 100 mL/hr over 60 Minutes Intravenous Every 8 hours 08/13/18 2038 08/17/18 1303   08/13/18 2045  vancomycin (VANCOCIN) IVPB 1000 mg/200 mL premix     1,000 mg 200 mL/hr over 60 Minutes Intravenous  Once 08/13/18 2038 08/13/18 2333      Procedures: None  CONSULTS:  general surgery and urology  Time spent: 25 minutes-Greater than 50% of this time was spent in counseling, explanation of diagnosis, planning of further management, and coordination of care.  MEDICATIONS: Scheduled Meds: . sodium chloride   Intravenous Once  . atorvastatin  40 mg Oral Q supper  . cholecalciferol  2,000 Units Oral Daily  . feeding supplement  1 Container Oral BID BM  . feeding supplement (PRO-STAT SUGAR FREE 64)  30 mL Oral BID  . ferrous sulfate  325 mg Oral Q breakfast  . metoprolol tartrate  100 mg Oral BID  .  multivitamin with minerals  1 tablet Oral Daily  . mupirocin ointment   Nasal BID  . pantoprazole  40 mg Oral BID AC  . polycarbophil  625 mg Oral BID  . prazosin  2 mg Oral QHS   Continuous Infusions: . sodium chloride 10 mL/hr at 08/29/18 0642  . ampicillin-sulbactam (UNASYN) IV 3 g (08/29/18 0903)  . lactated ringers 10 mL/hr at 08/18/18 2212  . lactated ringers 10 mL/hr at 08/25/18 1729   PRN Meds:.sodium chloride, acetaminophen **OR** acetaminophen, clonazePAM, ondansetron (ZOFRAN) IV, oxyCODONE, simethicone, tamsulosin   PHYSICAL EXAM: Vital signs: Vitals:   08/28/18 1432 08/28/18 2053 08/29/18 0618 08/29/18 0854  BP: (!) 127/56 (!) 160/79 (!) 158/74 (!) 147/83  Pulse: 81 75 72 68  Resp: 20 18 18    Temp: 98.4 F (36.9 C) 100.2 F (37.9 C) 98.8 F (37.1 C)   TempSrc: Oral Oral Oral   SpO2: 98% 97% 96%   Weight:      Height:  Filed Weights   08/13/18 1922 08/13/18 2234 08/26/18 1436  Weight: 86.2 kg 86.2 kg 86 kg   Body mass index is 25.71 kg/m.   Exam  Awake Alert, Oriented X 3, No new F.N deficits, Normal affect [place].AT,PERRAL Supple Neck,No JVD, No cervical lymphadenopathy appriciated.  Symmetrical Chest wall movement, Good air movement bilaterally, CTAB RRR,No Gallops, Rubs or new Murmurs, No Parasternal Heave +ve B.Sounds, Abd Soft, No tenderness, No organomegaly appriciated, No rebound - guarding or rigidity. No Cyanosis, Clubbing or edema, No new Rash or bruise Foley catheter in place, scrotal postop site under bandage.   I have personally reviewed following labs and imaging studies  LABORATORY DATA: CBC: Recent Labs  Lab 08/23/18 0407 08/24/18 0324 08/26/18 0301 08/26/18 1618 08/27/18 0430 08/28/18 0452  WBC 9.1 8.8 9.8  --  11.7* 10.5  HGB 8.1* 8.4* 7.8* 8.5* 8.7* 8.4*  HCT 26.7* 26.5* 24.3* 25.0* 27.1* 27.5*  MCV 90.5 90.8 90.3  --  92.5 91.7  PLT 625* 601* 499*  --  455* 428*    Basic Metabolic Panel: Recent Labs  Lab  08/23/18 0407 08/24/18 0324 08/26/18 0301 08/26/18 1618 08/27/18 0430 08/28/18 0452  NA 136 137 136 139 138 140  K 3.9 4.3 3.9 3.9 4.8 3.9  CL 106 107 106  --  105 107  CO2 23 24 23   --  25 24  GLUCOSE 101* 138* 113* 93 135* 106*  BUN 9 10 11   --  13 12  CREATININE 1.33* 1.25* 1.27*  --  1.29* 1.10  CALCIUM 7.9* 8.1* 8.1*  --  8.2* 8.2*    GFR: Estimated Creatinine Clearance: 79.4 mL/min (by C-G formula based on SCr of 1.1 mg/dL).  Liver Function Tests: No results for input(s): AST, ALT, ALKPHOS, BILITOT, PROT, ALBUMIN in the last 168 hours. No results for input(s): LIPASE, AMYLASE in the last 168 hours. No results for input(s): AMMONIA in the last 168 hours.  Coagulation Profile: No results for input(s): INR, PROTIME in the last 168 hours.  Cardiac Enzymes: No results for input(s): CKTOTAL, CKMB, CKMBINDEX, TROPONINI in the last 168 hours.  BNP (last 3 results) No results for input(s): PROBNP in the last 8760 hours.  HbA1C: No results for input(s): HGBA1C in the last 72 hours.  CBG: No results for input(s): GLUCAP in the last 168 hours.  Lipid Profile: No results for input(s): CHOL, HDL, LDLCALC, TRIG, CHOLHDL, LDLDIRECT in the last 72 hours.  Thyroid Function Tests: No results for input(s): TSH, T4TOTAL, FREET4, T3FREE, THYROIDAB in the last 72 hours.  Anemia Panel: No results for input(s): VITAMINB12, FOLATE, FERRITIN, TIBC, IRON, RETICCTPCT in the last 72 hours.  Urine analysis:    Component Value Date/Time   COLORURINE AMBER (A) 08/13/2018 2010   APPEARANCEUR CLOUDY (A) 08/13/2018 2010   LABSPEC 1.014 08/13/2018 2010   PHURINE 5.0 08/13/2018 2010   GLUCOSEU NEGATIVE 08/13/2018 2010   HGBUR NEGATIVE 08/13/2018 2010   [place] NEGATIVE 08/13/2018 2010   [place] NEGATIVE 08/13/2018 2010   PROTEINUR 30 (A) 08/13/2018 2010   NITRITE NEGATIVE 08/13/2018 2010   LEUKOCYTESUR NEGATIVE 08/13/2018 2010    Sepsis Labs: Lactic Acid, Venous    Component  Value Date/Time   LATICACIDVEN 1.95 (H) 08/13/2018 2313    MICROBIOLOGY: No results found for this or any previous visit (from the past 240 hour(s)).  RADIOLOGY STUDIES/RESULTS: Ct Abdomen Pelvis Wo Contrast  Result Date: 08/13/2018 CLINICAL DATA:  Groin swelling EXAM: CT ABDOMEN AND PELVIS WITHOUT CONTRAST TECHNIQUE:  Multidetector CT imaging of the abdomen and pelvis was performed following the standard protocol without IV contrast. COMPARISON:  MRI 07/10/2018 FINDINGS: Lower chest: Lung bases demonstrate no acute consolidation or effusion. The heart size is normal. Small hiatal hernia. Hepatobiliary: No focal liver abnormality is seen. No gallstones, gallbladder wall thickening, or biliary dilatation. Pancreas: Unremarkable. No pancreatic ductal dilatation or surrounding inflammatory changes. Spleen: Normal in size without focal abnormality. Adrenals/Urinary Tract: Adrenal glands are within normal limits. No hydronephrosis. Thick-walled urinary bladder Stomach/Bowel: Stomach is nonenlarged. No dilated small bowel. Extensive gas collection Between the rectum and prostate. Vascular/Lymphatic: Mild aortic atherosclerosis. No aneurysm. No significantly enlarged lymph nodes. Reproductive: Metallic densities along the posterior aspect of the prostate. Large gas collections around the prostate gland. Moderate edema of the scrotum and around the penis. Other: No free air. Fat in the left inguinal canal. Edema within the pubic soft tissues. Gas and fluid collection extending from the rectal area and along both sides of the prostate gland. Musculoskeletal: No acute or suspicious abnormality. IMPRESSION: 1. Moderate edema and mild inflammatory changes involving the scrotum and soft tissues about the penis, but without soft tissue emphysema. Mild edema within the pubic soft tissues. 2. Abnormal gas and fluid collection between the rectum and prostate gland and extending along the right and left aspects of the  prostate gland, felt to correspond to previously demonstrated rectal ulcer and sinus tracks. 3. Thick-walled appearance of the urinary bladder, possible cystitis Electronically Signed   By: Donavan Foil M.D.   On: 08/13/2018 23:19   Dg Chest 2 View  Result Date: 08/13/2018 CLINICAL DATA:  Acute onset of genital swelling. Fever. EXAM: CHEST - 2 VIEW COMPARISON:  Chest radiograph performed 07/07/2018 FINDINGS: The lungs are well-aerated. Minimal left basilar atelectasis is noted. There is no evidence of pleural effusion or pneumothorax. The heart is normal in size; the mediastinal contour is within normal limits. No acute osseous abnormalities are seen. IMPRESSION: Minimal left basilar atelectasis noted. Lungs otherwise clear. Electronically Signed   By: Garald Balding M.D.   On: 08/13/2018 22:16   Ct Pelvis Wo Contrast  Result Date: 08/17/2018 CLINICAL DATA:  Proctitis with micro perforation, now with abscess at penile shaft, worsening pelvic swelling EXAM: CT PELVIS WITHOUT CONTRAST TECHNIQUE: Multidetector CT imaging of the pelvis was performed following the standard protocol without intravenous contrast. COMPARISON:  CT abdomen/pelvis dated 09/09/2018. MR pelvis dated 07/10/2018. FINDINGS: Motion degraded images. Urinary Tract: Thick-walled bladder with indwelling Foley catheter and nondependent gas. Bowel: Visualized bowel is poorly evaluated but grossly unremarkable. Vascular/Lymphatic: Mild atherosclerotic calcifications of the bilateral iliac vessels. No suspicious pelvic lymphadenopathy. Reproductive: Gas within the bilateral seminal vesicles. Additional gas along the retro prostatic soft tissues (series 3/image 37) and extending along the right pelvic sidewall (series 3/image 31), unchanged. Fiducial radiation markers along the posterior prostate. One of these markers is now displaced posterior to the rectum (series 3/image 29), new from recent CT. Other: Subcutaneous stranding along the anterior  lower pelvic wall, left greater than right (series 3/image 32), progressive. Additional fluid/stranding along the proximal penile shaft (series 3/image 54) with diffuse scrotal edema (series 3/image 68). This appearance is compatible with cellulitis. No drainable fluid collection/abscess on CT. Musculoskeletal: Visualized osseous structures are within normal limits. IMPRESSION: Gas along the bilateral seminal vesicles and retro prostatic soft tissues, likely related to known rectal microperforation, unchanged. Associated cellulitis involving the lower pelvic wall, penile shaft, and scrotum. No drainable fluid collection/abscess on CT. Displaced fiducial radiation marker  now posterior to the rectum, new from recent CT. Electronically Signed   By: Julian Hy M.D.   On: 08/17/2018 11:34   US Renal  Result Date: 08/14/2018 CLINICAL DATA:  Acute renal failure. EXAM: RENAL / URINARY TRACT ULTRASOUND COMPLETE COMPARISON:  Noncontrast CT yesterday. FINDINGS: Right Kidney: Renal measurements: 12.6 x 5.2 x 6.1 cm = volume: 207 mL . Echogenicity within normal limits. No mass or hydronephrosis visualized. Left Kidney: Renal measurements: 12.8 x 5.1 x 5.7 cm = volume: 195 mL. Echogenicity within normal limits. No mass or hydronephrosis visualized. Bladder: Bladder wall thickening of 9 mm, as seen on CT. IMPRESSION: 1. Bladder wall thickening, similar to CT yesterday. 2. Unremarkable sonographic appearance of both kidneys. No hydronephrosis. Electronically Signed   By: Keith Rake M.D.   On: 08/14/2018 03:48   US Scrotum W/doppler  Result Date: 08/13/2018 CLINICAL DATA:  Initial evaluation for acute testicular pain, swelling. EXAM: SCROTAL ULTRASOUND DOPPLER ULTRASOUND OF THE TESTICLES TECHNIQUE: Complete ultrasound examination of the testicles, epididymis, and other scrotal structures was performed. Color and spectral Doppler ultrasound were also utilized to evaluate blood flow to the testicles. COMPARISON:   None. FINDINGS: Right testicle Measurements: 3.5 x 2.5 x 1.9 cm. No mass lesion. Testicular microlithiasis noted. Left testicle Measurements: 3.9 x 2.2 x 2.3 cm. No mass lesion. Testicular microlithiasis noted. Right epididymis:  Normal in size and appearance. Left epididymis: Diffusely increased vascularity seen within the left epididymis, suggesting acute epididymitis. Hydrocele:  Bilateral hydroceles noted. Varicocele:  Bilateral varicoceles noted. Pulsed Doppler interrogation of both testes demonstrates normal low resistance arterial and venous waveforms bilaterally. Incidental note made of a probable small fat containing right inguinal hernia. IMPRESSION: 1. Increased vascularity within the left epididymis, suggesting acute epididymitis. No evidence for testicular torsion. 2. Small moderate bilateral hydroceles, likely reactive. 3. Small bilateral varicoceles. 4. Probable small fat containing right inguinal hernia. 5. Testicular microlithiasis. Current literature suggests that testicular microlithiasis is not a significant independent risk factor for development of testicular carcinoma, and that follow up imaging is not warranted in the absence of other risk factors. Monthly testicular self-examination and annual physical exams are considered appropriate surveillance. If patient has other risk factors for testicular carcinoma, then referral to Urology should be considered. (Reference: DeCastro, et al.: A 5-Year Follow up Study of Asymptomatic Men with Testicular Microlithiasis. J Urol 2008; [id number].) Electronically Signed   By: Jeannine Boga M.D.   On: 08/13/2018 22:48   US Abdomen Limited Ruq  Result Date: 08/14/2018 CLINICAL DATA:  Initial evaluation for elevated AST with leukocytosis. EXAM: ULTRASOUND ABDOMEN LIMITED RIGHT UPPER QUADRANT COMPARISON:  None. FINDINGS: Gallbladder: No gallstones or wall thickening visualized. No sonographic Murphy sign noted by sonographer. Common bile duct:  Diameter: 4.6 mm Liver: No focal lesion identified. Within normal limits in parenchymal echogenicity. Portal vein is patent on color Doppler imaging with normal direction of blood flow towards the liver. IMPRESSION: Normal right upper quadrant ultrasound. No cholelithiasis, evidence for acute cholecystitis, or biliary dilatation. Electronically Signed   By: Jeannine Boga M.D.   On: 08/14/2018 03:43     LOS: 15 days    Signature  Lala Lund M.D on 08/29/2018 at 9:22 AM   -  To page go to [email or web address]

## 2018-08-29 NOTE — Progress Notes (Signed)
Pharmacy Antibiotic Note  Phillip Blackwell is a 58 y.o. male admitted on 08/13/2018 with sepsis. Patient with blood cultures growing rare organism: EGGERTHELLA LENTA. Dr. Sloan Leiter discussed the case with ID. Plans to transition to PO Augmentin after and extended course of Unasyn.   S/p Laparoscopic descending colostomy and Second Stage Penoscrotal Wound Debridementt. Afebrile, WBC wnl, clinically improved.  AKI  Improved, SCr 1.1.    ID recommended 3 weeks IV antibiotic then transition to oral augmentin.    Plan: Continue Unasyn 3g IV q6h Monitor clinical status, renal function daily.    Height: 6' (182.9 cm) Weight: 189 lb 9.5 oz (86 kg) IBW/kg (Calculated) : 77.6  Temp (24hrs), Avg:99.1 F (37.3 C), Min:98.4 F (36.9 C), Max:100.2 F (37.9 C)  Recent Labs  Lab 08/23/18 0407 08/24/18 0324 08/26/18 0301 08/27/18 0430 08/28/18 0452  WBC 9.1 8.8 9.8 11.7* 10.5  CREATININE 1.33* 1.25* 1.27* 1.29* 1.10    Estimated Creatinine Clearance: 79.4 mL/min (by C-G formula based on SCr of 1.1 mg/dL).    No Known Allergies  Antimicrobials this admission: Vanc 01/03 >> 1/6 Cefepime 01/03 >>1/10 Flagyl 1/3>>1/10 Unasyn 1/10>>  Microbiology Results: 1/3 BCx: EGGERTHELLA LENTA 1/3 UCx: NGF 1/8 Penoscrotal cx>>Mixed  1/5 MRSA PCR: negative  1/8 MRSA surgical PCR: SA positive; MRSA negative  Thank you for allowing pharmacy to be part of this patients care team.  Gwenlyn Found, Sherian Rein D PGY1 Pharmacy Resident  Phone [phone] 08/29/2018   12:23 PM

## 2018-08-30 NOTE — Consult Note (Signed)
[facility] Nurse ostomy follow up Stoma type/location: LLQ colostomy. Pouch leaking and patient unaware upon my arrival today.  Pan-caked stool leaking from medial edge of pouch tape. Stomal assessment/size: 1 inch x 1 and 5/8 inch oval, flush and in a crease. Two small granulomas noted at 3 o'clock that are not causing any problems (bleeding, etc.) at this time. Peristomal assessment: intact Treatment options for stomal/peristomal skin: skin barrier ring and belt added today to place additional pressure at 3 and 9 o'clock Output: pasty, dry stool.  Ostomy pouching: 1pc.soft convex with skin barrier ring and belt Education provided:  Patient described that he and his wife "changed the pouch twice this past weekend".  When I explored further, thinking that he meant "emptied", I learned that he emptied his pouch once with assistance and that they changed the pouch twice as they "were nervous". We discuss the difference between emptying and changing, and that typically emptying occurs several times a day eg., after meals and before bedtime, but most importantly, whenever the pouch is 1/3 to 1/2 full. He indicates understanding. Pouch change needed today as a large amount of dry stool was pan caking and exiting from the medial edge of the pouch tape.  Pouch removal demonstrated, as there was a good deal of soiling and patient was overwhelmed.  I traced the pouch pattern and cut it out, he assisted me in placing the skin barrier ring on the back of the pouch. Patient is unable to see the ostomy; I place the ostomy pouching system and he applies gentle warming hand pressure to activate adhesives and achieve seal. I remove the tape borders and patient closes the pouch using the Lock and Roll mechanism independently. Supplies are ordered and delivered to room including a belt, that I assist him in applying.  He is taught that the additional reinforcement of pressure at 3 and 9 o'clock is required at this  time.  Enrolled patient in [place] Start Discharge program: Yes, previously (on 08/24/2018 by M. Liane Comber)

## 2018-08-30 NOTE — Progress Notes (Addendum)
Physical Therapy Treatment Patient Details Name: Phillip Blackwell MRN: [id number] DOB: Jun 06, 1961 Today's Date: 08/30/2018    History of Present Illness Pt is a 58 y.o. male with medical history significant of anxiety, depression, GERD, hypertension, CVA, OSA presenting to the hospital for evaluation of groin pain and swelling , fever, blood in stool, work-up was significant for sepsis, acute blood loss anemia, is admitted for further work-up, this is secondary to colitis/prostatitis/epididymitis from perforated rectal ulcer.  Pt is s/p ostomy and muktiple I and D to penis/scrotal area.     PT Comments    Pt supine in bed reports pain 9/10 but agreeable to supine LE exexercises.  Discussed plan to return home with patient and family.  Pt will need a RW at d/c.  Discussed possible need for hospital bed but patient declined need at this time.  Will inform supervising PT to update recommendations to HHPT at this time.  Plan next session for stair training and transfer training from standard seat height.     Follow Up Recommendations  Supervision/Assistance - 24 hour;Home health PT     Equipment Recommendations  Rolling walker with 5" wheels    Recommendations for Other Services       Precautions / Restrictions Precautions Precautions: Fall Restrictions Weight Bearing Restrictions: No    Mobility  Bed Mobility               General bed mobility comments: Pt boosted in bed in supine with heavy use of railings. No assistance needed.    Transfers                    Ambulation/Gait                 Stairs             Wheelchair Mobility    Modified Rankin (Stroke Patients Only)       Balance Overall balance assessment: Needs assistance   Sitting balance-Leahy Scale: Fair       Standing balance-Leahy Scale: Poor                              Cognition Arousal/Alertness: Awake/alert Behavior During Therapy: WFL for tasks  assessed/performed Overall Cognitive Status: Within Functional Limits for tasks assessed                                        Exercises General Exercises - Lower Extremity Ankle Circles/Pumps: AROM;15 reps;Both;Supine Quad Sets: AROM;Supine;Both;10 reps Heel Slides: Supine;Both;10 reps;AROM Hip ABduction/ADduction: Supine;Both;AROM;10 reps Straight Leg Raises: AAROM;Supine;Both;Limitations;10 reps    General Comments        Pertinent Vitals/Pain Pain Assessment: 0-10 Pain Score: 9  Pain Location: scrotal and rectal pain, denies stomach pain.   Pain Descriptors / Indicators: Aching;Grimacing;Guarding Pain Intervention(s): Limited activity within patient's tolerance(deferred ambulation due to pain.  )    Home Living                      Prior Function            PT Goals (current goals can now be found in the care plan section) Acute Rehab PT Goals Patient Stated Goal: to get stronger Potential to Achieve Goals: Good Progress towards PT goals: Progressing toward goals    Frequency    Min  3X/week      PT Plan Current plan remains appropriate    Co-evaluation              AM-PAC PT "6 Clicks" Mobility   Outcome Measure  Help needed turning from your back to your side while in a flat bed without using bedrails?: A Little Help needed moving from lying on your back to sitting on the side of a flat bed without using bedrails?: A Little Help needed moving to and from a bed to a chair (including a wheelchair)?: A Little Help needed standing up from a chair using your arms (e.g., wheelchair or bedside chair)?: A Little Help needed to walk in hospital room?: A Little Help needed climbing 3-5 steps with a railing? : A Little 6 Click Score: 18    End of Session Equipment Utilized During Treatment: Gait belt Activity Tolerance: Patient limited by pain Patient left: in bed;with call bell/phone within reach;with bed alarm set Nurse  Communication: Mobility status PT Visit Diagnosis: Muscle weakness (generalized) (M62.81);Difficulty in walking, not elsewhere classified (R26.2)     Time: 5726-2035 PT Time Calculation (min) (ACUTE ONLY): 11 min  Charges:  $Therapeutic Exercise: 8-22 mins                     [name], PTA Acute Rehabilitation Services Pager [phone] Office [phone]     Hagop Mccollam Eli Hose 08/30/2018, 4:17 PM

## 2018-08-30 NOTE — Progress Notes (Signed)
PROGRESS NOTE        PATIENT DETAILS Name: Phillip Blackwell Age: 58 y.o. Sex: male Date of Birth: 1961-01-12 Admit Date: 08/13/2018 Admitting Physician Shela Leff, MD HDQ:[id number], Pcp Not In  Brief Narrative: Patient is a 58 y.o. male with history of hypertension, BPH, OSA prostate cancer status post external beam radiation-presented with sepsis secondary to rectal ulcer with perforation with seeding of the GU tract causing epididymitis/prostatitis.  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 Further hospital course complicated by worsening penile/scrotal infection and AKI.  Evaluated by general surgery and urology-underwent diverting colostomy, and has had multiple trips to the OR for scrotal debridement.  See below for further details   Subjective:  Patient in bed, appears comfortable, denies any headache, no fever, no chest pain or pressure, no shortness of breath , no abdominal pain. No focal weakness.  Assessment/Plan:  Severe sepsis secondary to perforated rectal ulcer with prostatitis/epididymitis and penoscrotal abscess along with eggerthella lenta bacteremia: Sepsis pathophysiology has resolved, he is also undergone diverting colostomy which appears stable, seen by urology underwent scrotal debridement on 08/23/2018 along with 08/27/2018, I discussed the case with urologist Dr. Tammi Klippel on 08/28/2018 he will require at least 1 more OR visit.  Previous MD had discussed the case with ID physician Dr. Megan Salon.  He has been maintained on IV Unasyn.  He will require total of 3 weeks of antibiotics once stable from urology standpoint will be transition to oral Augmentin.  He may require to see plastic surgery as well.  AKI: This was hemodynamically mediated ATN which has resolved after supportive care and hydration.   Multifactorial anemia secondary to acute blood loss anemia and secondary to acute/critical illness: No further hematochezia-hemoglobin  levels are relatively stable-no further hematochezia, suspicion for acute illness causing him to have persistent anemia rather than further GI bleeding.  Has been transfused 1 unit of PRBC so far, follow CBC periodically.   Hyperbilirubinemia: Due to sepsis resolved, CT scan unremarkable from hepatobiliary standpoint.       Hypertension: Controlled-continue metoprolol, prazosin.   Dyslipidemia: Continue statin  BPH: Continue Flomax  OSA: CPAP nightly-but per RT note-patient has been refusing lately-we will counsel  Moderate protein calorie malnutrition: Continue supplements  Acute debility/deconditioning: Likely secondary to acute illness-initially plans were for SNF placement-however patient refusing SNF and wants to go home.  Will need maximum home health services on discharge  DVT Prophylaxis: SCD's  Code Status: Full code  Family Communication: Wife bedside on 08/29/2018, updated with the plan.  Disposition Plan: Remain inpatient-requires several more days of hospitalization before consideration of discharge  Antimicrobial agents: Anti-infectives (From admission, onward)   Start     Dose/Rate Route Frequency Ordered Stop   08/23/18 1500  Ampicillin-Sulbactam (UNASYN) 3 g in sodium chloride 0.9 % 100 mL IVPB     3 g 200 mL/hr over 30 Minutes Intravenous Every 6 hours 08/23/18 1434     08/23/18 0845  Ampicillin-Sulbactam (UNASYN) 3 g in sodium chloride 0.9 % 100 mL IVPB     3 g 200 mL/hr over 30 Minutes Intravenous To [facility]Stay [facility] 08/23/18 0839 08/23/18 1018   08/20/18 2200  ceFEPIme (MAXIPIME) 2 g in sodium chloride 0.9 % 100 mL IVPB  Status:  Discontinued     2 g 200 mL/hr over 30 Minutes Intravenous Every 12 hours 08/20/18 1315 08/20/18  1406   08/20/18 1500  Ampicillin-Sulbactam (UNASYN) 3 g in sodium chloride 0.9 % 100 mL IVPB  Status:  Discontinued     3 g 200 mL/hr over 30 Minutes Intravenous Every 6 hours 08/20/18 1420 08/23/18 0839   08/17/18 1400   metroNIDAZOLE (FLAGYL) tablet 500 mg  Status:  Discontinued     500 mg Oral Every 8 hours 08/17/18 1303 08/20/18 1406   08/16/18 2000  ceFEPIme (MAXIPIME) 2 g in sodium chloride 0.9 % 100 mL IVPB  Status:  Discontinued     2 g 200 mL/hr over 30 Minutes Intravenous Every 24 hours 08/16/18 1328 08/20/18 1315   08/15/18 1030  vancomycin (VANCOCIN) 1,250 mg in sodium chloride 0.9 % 250 mL IVPB     1,250 mg 166.7 mL/hr over 90 Minutes Intravenous  Once 08/15/18 0942 08/15/18 1444   08/14/18 2000  ceFEPIme (MAXIPIME) 1 g in sodium chloride 0.9 % 100 mL IVPB  Status:  Discontinued     1 g 200 mL/hr over 30 Minutes Intravenous Every 24 hours 08/13/18 2052 08/16/18 1328   08/13/18 2052  vancomycin variable dose per unstable renal function (pharmacist dosing)  Status:  Discontinued      Does not apply See admin instructions 08/13/18 2052 08/16/18 1442   08/13/18 2045  ceFEPIme (MAXIPIME) 2 g in sodium chloride 0.9 % 100 mL IVPB     2 g 200 mL/hr over 30 Minutes Intravenous  Once 08/13/18 2038 08/13/18 2141   08/13/18 2045  metroNIDAZOLE (FLAGYL) IVPB 500 mg  Status:  Discontinued     500 mg 100 mL/hr over 60 Minutes Intravenous Every 8 hours 08/13/18 2038 08/17/18 1303   08/13/18 2045  vancomycin (VANCOCIN) IVPB 1000 mg/200 mL premix     1,000 mg 200 mL/hr over 60 Minutes Intravenous  Once 08/13/18 2038 08/13/18 2333      Procedures: None  CONSULTS:  general surgery and urology  Time spent: 25 minutes-Greater than 50% of this time was spent in counseling, explanation of diagnosis, planning of further management, and coordination of care.  MEDICATIONS: Scheduled Meds: . sodium chloride   Intravenous Once  . atorvastatin  40 mg Oral Q supper  . cholecalciferol  2,000 Units Oral Daily  . feeding supplement  1 Container Oral BID BM  . feeding supplement (PRO-STAT SUGAR FREE 64)  30 mL Oral BID  . ferrous sulfate  325 mg Oral Q breakfast  . metoprolol tartrate  100 mg Oral BID  .  multivitamin with minerals  1 tablet Oral Daily  . mupirocin ointment   Nasal BID  . pantoprazole  40 mg Oral BID AC  . polycarbophil  625 mg Oral BID  . prazosin  2 mg Oral QHS   Continuous Infusions: . sodium chloride 10 mL/hr at 08/30/18 0648  . ampicillin-sulbactam (UNASYN) IV 3 g (08/30/18 0939)  . lactated ringers 10 mL/hr at 08/18/18 2212  . lactated ringers 10 mL/hr at 08/25/18 1729   PRN Meds:.sodium chloride, acetaminophen **OR** acetaminophen, clonazePAM, ondansetron (ZOFRAN) IV, oxyCODONE, simethicone, tamsulosin   PHYSICAL EXAM: Vital signs: Vitals:   08/29/18 1431 08/29/18 2150 08/30/18 0520 08/30/18 0935  BP: (!) 148/71 (!) 156/65 136/76 132/66  Pulse: 81 73 83 79  Resp: 20 16    Temp: 98.9 F (37.2 C) 99.5 F (37.5 C) 99.3 F (37.4 C)   TempSrc: Oral Oral Oral   SpO2: 100% 95% 100%   Weight:      Height:  Filed Weights   08/13/18 1922 08/13/18 2234 08/26/18 1436  Weight: 86.2 kg 86.2 kg 86 kg   Body mass index is 25.71 kg/m.   Exam  Awake Alert, Oriented X 3, No new F.N deficits, Normal affect [place].AT,PERRAL Supple Neck,No JVD, No cervical lymphadenopathy appriciated.  Symmetrical Chest wall movement, Good air movement bilaterally, CTAB RRR,No Gallops, Rubs or new Murmurs, No Parasternal Heave +ve B.Sounds, Abd Soft, No tenderness, No organomegaly appriciated, No rebound - guarding or rigidity. No Cyanosis, Clubbing or edema, No new Rash or bruise Foley catheter in place, scrotal postop site under bandage.   I have personally reviewed following labs and imaging studies  LABORATORY DATA: CBC: Recent Labs  Lab 08/24/18 0324 08/26/18 0301 08/26/18 1618 08/27/18 0430 08/28/18 0452  WBC 8.8 9.8  --  11.7* 10.5  HGB 8.4* 7.8* 8.5* 8.7* 8.4*  HCT 26.5* 24.3* 25.0* 27.1* 27.5*  MCV 90.8 90.3  --  92.5 91.7  PLT 601* 499*  --  455* 428*    Basic Metabolic Panel: Recent Labs  Lab 08/24/18 0324 08/26/18 0301 08/26/18 1618 08/27/18 0430  08/28/18 0452  NA 137 136 139 138 140  K 4.3 3.9 3.9 4.8 3.9  CL 107 106  --  105 107  CO2 24 23  --  25 24  GLUCOSE 138* 113* 93 135* 106*  BUN 10 11  --  13 12  CREATININE 1.25* 1.27*  --  1.29* 1.10  CALCIUM 8.1* 8.1*  --  8.2* 8.2*    GFR: Estimated Creatinine Clearance: 79.4 mL/min (by C-G formula based on SCr of 1.1 mg/dL).  Liver Function Tests: No results for input(s): AST, ALT, ALKPHOS, BILITOT, PROT, ALBUMIN in the last 168 hours. No results for input(s): LIPASE, AMYLASE in the last 168 hours. No results for input(s): AMMONIA in the last 168 hours.  Coagulation Profile: No results for input(s): INR, PROTIME in the last 168 hours.  Cardiac Enzymes: No results for input(s): CKTOTAL, CKMB, CKMBINDEX, TROPONINI in the last 168 hours.  BNP (last 3 results) No results for input(s): PROBNP in the last 8760 hours.  HbA1C: No results for input(s): HGBA1C in the last 72 hours.  CBG: No results for input(s): GLUCAP in the last 168 hours.  Lipid Profile: No results for input(s): CHOL, HDL, LDLCALC, TRIG, CHOLHDL, LDLDIRECT in the last 72 hours.  Thyroid Function Tests: No results for input(s): TSH, T4TOTAL, FREET4, T3FREE, THYROIDAB in the last 72 hours.  Anemia Panel: No results for input(s): VITAMINB12, FOLATE, FERRITIN, TIBC, IRON, RETICCTPCT in the last 72 hours.  Urine analysis:    Component Value Date/Time   COLORURINE AMBER (A) 08/13/2018 2010   APPEARANCEUR CLOUDY (A) 08/13/2018 2010   LABSPEC 1.014 08/13/2018 2010   PHURINE 5.0 08/13/2018 2010   GLUCOSEU NEGATIVE 08/13/2018 2010   HGBUR NEGATIVE 08/13/2018 2010   [place] NEGATIVE 08/13/2018 2010   [place] NEGATIVE 08/13/2018 2010   PROTEINUR 30 (A) 08/13/2018 2010   NITRITE NEGATIVE 08/13/2018 2010   LEUKOCYTESUR NEGATIVE 08/13/2018 2010    Sepsis Labs: Lactic Acid, Venous    Component Value Date/Time   LATICACIDVEN 1.95 (H) 08/13/2018 2313    MICROBIOLOGY: No results found for this or  any previous visit (from the past 240 hour(s)).  RADIOLOGY STUDIES/RESULTS: Ct Abdomen Pelvis Wo Contrast  Result Date: 08/13/2018 CLINICAL DATA:  Groin swelling EXAM: CT ABDOMEN AND PELVIS WITHOUT CONTRAST TECHNIQUE: Multidetector CT imaging of the abdomen and pelvis was performed following the standard protocol without IV contrast.  COMPARISON:  MRI 07/10/2018 FINDINGS: Lower chest: Lung bases demonstrate no acute consolidation or effusion. The heart size is normal. Small hiatal hernia. Hepatobiliary: No focal liver abnormality is seen. No gallstones, gallbladder wall thickening, or biliary dilatation. Pancreas: Unremarkable. No pancreatic ductal dilatation or surrounding inflammatory changes. Spleen: Normal in size without focal abnormality. Adrenals/Urinary Tract: Adrenal glands are within normal limits. No hydronephrosis. Thick-walled urinary bladder Stomach/Bowel: Stomach is nonenlarged. No dilated small bowel. Extensive gas collection Between the rectum and prostate. Vascular/Lymphatic: Mild aortic atherosclerosis. No aneurysm. No significantly enlarged lymph nodes. Reproductive: Metallic densities along the posterior aspect of the prostate. Large gas collections around the prostate gland. Moderate edema of the scrotum and around the penis. Other: No free air. Fat in the left inguinal canal. Edema within the pubic soft tissues. Gas and fluid collection extending from the rectal area and along both sides of the prostate gland. Musculoskeletal: No acute or suspicious abnormality. IMPRESSION: 1. Moderate edema and mild inflammatory changes involving the scrotum and soft tissues about the penis, but without soft tissue emphysema. Mild edema within the pubic soft tissues. 2. Abnormal gas and fluid collection between the rectum and prostate gland and extending along the right and left aspects of the prostate gland, felt to correspond to previously demonstrated rectal ulcer and sinus tracks. 3. Thick-walled  appearance of the urinary bladder, possible cystitis Electronically Signed   By: Donavan Foil M.D.   On: 08/13/2018 23:19   Dg Chest 2 View  Result Date: 08/13/2018 CLINICAL DATA:  Acute onset of genital swelling. Fever. EXAM: CHEST - 2 VIEW COMPARISON:  Chest radiograph performed 07/07/2018 FINDINGS: The lungs are well-aerated. Minimal left basilar atelectasis is noted. There is no evidence of pleural effusion or pneumothorax. The heart is normal in size; the mediastinal contour is within normal limits. No acute osseous abnormalities are seen. IMPRESSION: Minimal left basilar atelectasis noted. Lungs otherwise clear. Electronically Signed   By: Garald Balding M.D.   On: 08/13/2018 22:16   Ct Pelvis Wo Contrast  Result Date: 08/17/2018 CLINICAL DATA:  Proctitis with micro perforation, now with abscess at penile shaft, worsening pelvic swelling EXAM: CT PELVIS WITHOUT CONTRAST TECHNIQUE: Multidetector CT imaging of the pelvis was performed following the standard protocol without intravenous contrast. COMPARISON:  CT abdomen/pelvis dated 09/09/2018. MR pelvis dated 07/10/2018. FINDINGS: Motion degraded images. Urinary Tract: Thick-walled bladder with indwelling Foley catheter and nondependent gas. Bowel: Visualized bowel is poorly evaluated but grossly unremarkable. Vascular/Lymphatic: Mild atherosclerotic calcifications of the bilateral iliac vessels. No suspicious pelvic lymphadenopathy. Reproductive: Gas within the bilateral seminal vesicles. Additional gas along the retro prostatic soft tissues (series 3/image 37) and extending along the right pelvic sidewall (series 3/image 31), unchanged. Fiducial radiation markers along the posterior prostate. One of these markers is now displaced posterior to the rectum (series 3/image 29), new from recent CT. Other: Subcutaneous stranding along the anterior lower pelvic wall, left greater than right (series 3/image 32), progressive. Additional fluid/stranding along the  proximal penile shaft (series 3/image 54) with diffuse scrotal edema (series 3/image 68). This appearance is compatible with cellulitis. No drainable fluid collection/abscess on CT. Musculoskeletal: Visualized osseous structures are within normal limits. IMPRESSION: Gas along the bilateral seminal vesicles and retro prostatic soft tissues, likely related to known rectal microperforation, unchanged. Associated cellulitis involving the lower pelvic wall, penile shaft, and scrotum. No drainable fluid collection/abscess on CT. Displaced fiducial radiation marker now posterior to the rectum, new from recent CT. Electronically Signed   By: Julian Hy  M.D.   On: 08/17/2018 11:34   US Renal  Result Date: 08/14/2018 CLINICAL DATA:  Acute renal failure. EXAM: RENAL / URINARY TRACT ULTRASOUND COMPLETE COMPARISON:  Noncontrast CT yesterday. FINDINGS: Right Kidney: Renal measurements: 12.6 x 5.2 x 6.1 cm = volume: 207 mL . Echogenicity within normal limits. No mass or hydronephrosis visualized. Left Kidney: Renal measurements: 12.8 x 5.1 x 5.7 cm = volume: 195 mL. Echogenicity within normal limits. No mass or hydronephrosis visualized. Bladder: Bladder wall thickening of 9 mm, as seen on CT. IMPRESSION: 1. Bladder wall thickening, similar to CT yesterday. 2. Unremarkable sonographic appearance of both kidneys. No hydronephrosis. Electronically Signed   By: Keith Rake M.D.   On: 08/14/2018 03:48   US Scrotum W/doppler  Result Date: 08/13/2018 CLINICAL DATA:  Initial evaluation for acute testicular pain, swelling. EXAM: SCROTAL ULTRASOUND DOPPLER ULTRASOUND OF THE TESTICLES TECHNIQUE: Complete ultrasound examination of the testicles, epididymis, and other scrotal structures was performed. Color and spectral Doppler ultrasound were also utilized to evaluate blood flow to the testicles. COMPARISON:  None. FINDINGS: Right testicle Measurements: 3.5 x 2.5 x 1.9 cm. No mass lesion. Testicular microlithiasis noted.  Left testicle Measurements: 3.9 x 2.2 x 2.3 cm. No mass lesion. Testicular microlithiasis noted. Right epididymis:  Normal in size and appearance. Left epididymis: Diffusely increased vascularity seen within the left epididymis, suggesting acute epididymitis. Hydrocele:  Bilateral hydroceles noted. Varicocele:  Bilateral varicoceles noted. Pulsed Doppler interrogation of both testes demonstrates normal low resistance arterial and venous waveforms bilaterally. Incidental note made of a probable small fat containing right inguinal hernia. IMPRESSION: 1. Increased vascularity within the left epididymis, suggesting acute epididymitis. No evidence for testicular torsion. 2. Small moderate bilateral hydroceles, likely reactive. 3. Small bilateral varicoceles. 4. Probable small fat containing right inguinal hernia. 5. Testicular microlithiasis. Current literature suggests that testicular microlithiasis is not a significant independent risk factor for development of testicular carcinoma, and that follow up imaging is not warranted in the absence of other risk factors. Monthly testicular self-examination and annual physical exams are considered appropriate surveillance. If patient has other risk factors for testicular carcinoma, then referral to Urology should be considered. (Reference: DeCastro, et al.: A 5-Year Follow up Study of Asymptomatic Men with Testicular Microlithiasis. J Urol 2008; [id number].) Electronically Signed   By: Jeannine Boga M.D.   On: 08/13/2018 22:48   US Abdomen Limited Ruq  Result Date: 08/14/2018 CLINICAL DATA:  Initial evaluation for elevated AST with leukocytosis. EXAM: ULTRASOUND ABDOMEN LIMITED RIGHT UPPER QUADRANT COMPARISON:  None. FINDINGS: Gallbladder: No gallstones or wall thickening visualized. No sonographic Murphy sign noted by sonographer. Common bile duct: Diameter: 4.6 mm Liver: No focal lesion identified. Within normal limits in parenchymal echogenicity. Portal vein is  patent on color Doppler imaging with normal direction of blood flow towards the liver. IMPRESSION: Normal right upper quadrant ultrasound. No cholelithiasis, evidence for acute cholecystitis, or biliary dilatation. Electronically Signed   By: Jeannine Boga M.D.   On: 08/14/2018 03:43     LOS: 16 days    Signature  Lala Lund M.D on 08/30/2018 at 11:40 AM   -  To page go to [email or web address]

## 2018-08-31 LAB — BASIC METABOLIC PANEL
Anion gap: 7 (ref 5–15)
BUN: 9 mg/dL (ref 6–20)
CO2: 23 mmol/L (ref 22–32)
Calcium: 8.1 mg/dL — ABNORMAL LOW (ref 8.9–10.3)
Chloride: 106 mmol/L (ref 98–111)
Creatinine, Ser: 1.15 mg/dL (ref 0.61–1.24)
GFR calc Af Amer: >60 mL/min (ref >60)
GFR calc non Af Amer: >60 mL/min (ref >60)
Glucose, Bld: 112 mg/dL — ABNORMAL HIGH (ref 70–99)
Potassium: 3.5 mmol/L (ref 3.5–5.1)
Sodium: 136 mmol/L (ref 135–145)

## 2018-08-31 LAB — TYPE AND SCREEN
ABO/RH(D): O POS
Antibody Screen: NEGATIVE

## 2018-08-31 LAB — CBC
HCT: 25.8 % — ABNORMAL LOW (ref 39.0–52.0)
Hemoglobin: 7.9 g/dL — ABNORMAL LOW (ref 13.0–17.0)
MCH: 28.1 pg (ref 26.0–34.0)
MCHC: 30.6 g/dL (ref 30.0–36.0)
MCV: 91.8 fL (ref 80.0–100.0)
Platelets: 320 10*3/uL (ref 150–400)
RBC: 2.81 MIL/uL — AB (ref 4.22–5.81)
RDW: 15.3 % (ref 11.5–15.5)
WBC: 11.6 10*3/uL — ABNORMAL HIGH (ref 4.0–10.5)
nRBC: 0 % (ref 0.0–0.2)

## 2018-08-31 LAB — PROTIME-INR
INR: 1.3
Prothrombin Time: 16 seconds — ABNORMAL HIGH (ref 11.4–15.2)

## 2018-08-31 LAB — MAGNESIUM: Magnesium: 1.6 mg/dL — ABNORMAL LOW (ref 1.7–2.4)

## 2018-08-31 MED ORDER — MAGNESIUM SULFATE 2 GM/50ML IV SOLN
2.0000 g | Freq: Once | INTRAVENOUS | Status: AC
  Administered 2018-08-31: 2 g via INTRAVENOUS
  Filled 2018-08-31: qty 50

## 2018-08-31 NOTE — Progress Notes (Signed)
Nutrition Follow-up  DOCUMENTATION CODES:   Non-severe (moderate) malnutrition in context of chronic illness  INTERVENTION:   - Continue MVI with minerals daily  - Continue Boost Breeze po TID, each supplement provides 250 kcal and 9 grams of protein  - Continue Pro-stat 30 ml BID, each supplement provides 100 kcal and 15 grams of protein  NUTRITION DIAGNOSIS:   Moderate Malnutrition related to chronic illness (recurrent prostate cancer s/p radiation, chronic rectal ulcer/perforation) as evidenced by mild fat depletion, mild muscle depletion, percent weight loss (11.1% weight loss in less than 2 months).  Ongoing  GOAL:   Patient will meet greater than or equal to 90% of their needs  Progressing  MONITOR:   PO intake, Supplement acceptance, I & O's, Weight trends, Labs  REASON FOR ASSESSMENT:   Malnutrition Screening Tool    ASSESSMENT:   58 year old male who presented to the ED on 1/3 with groin swelling, N/V, and diarrhea. PMH significant for anxiety, depression, CVA, GERD, recurrent prostate cancer s/p radiation in 2018, chronic rectal ulcer/perforation,  HTN. Pt admitted with Code Sepsis secondary to acute epididymitis and AKI.  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  Spoke with pt at bedside who reports appetite is slowly improving and that he is eating more. Pt reports eating 100% of oatmeal and "some" of eggs this morning for breakfast. Noted Boost Breeze on bedside table; pt sipping Boost Breeze throughout RD visit. Pt states that he is drinking Boost Breeze supplements 100% of the times that he is offered them. Pt also states that he is consuming 100% of Pro-stat. RD encouraged  continued adequate PO intake.  Meal Completion: 75-100% x last 8 recorded meals  Medications reviewed and include: vitamin D-3, Boost Breeze BID, Pro-stat BID, ferrous sulfate, MVI with minerals, Protonix, Fibercon, IV Unasyn, IV magnesium sulfate 2 grams once IVF: NS @ 10 ml/hr  Labs reviewed: magnesium 1.6 (L) - being replaced, hemoglobin 7.9 (L)  UOP: 2895 ml x 24 hours  Diet Order:   Diet Order            Diet NPO time specified  Diet effective 0500        Diet regular Room service appropriate? Yes; Fluid consistency: Thin  Diet effective now              EDUCATION NEEDS:   Education needs have been addressed  Skin:  Skin Assessment: Skin Integrity Issues: Incisions: perineum, scrotum, abdomen  Last BM:  1/20 (colostomy)  Height:   Ht Readings from Last 1 Encounters:  08/26/18 6' (1.829 m)    Weight:   Wt Readings from Last 1 Encounters:  08/26/18 86 kg    Ideal Body Weight:  80.9 kg  BMI:  Body mass index is 25.71 kg/m.  Estimated Nutritional Needs:   Kcal:  2100-2300  Protein:  100-115 grams  Fluid:  >/= 2.1 L    Gaynell Face, MS, RD, LDN Inpatient Clinical Dietitian Pager: [phone] Weekend/After Hours: [phone]

## 2018-08-31 NOTE — Progress Notes (Signed)
Subjective/Chief Complaint:   1 - Prostate Cancer, Likely Recurrent - s/p primary external beam radiation completed 03/2017 in [place] for larger volume Gleason 6 disease. Initial PSA 5.5 and diagnosed 2017 by Dr. Alyson Ingles.  Recent PSA Course: 06/2017 - PSA 1.1 06/2018 - PSA 2.6   2 - Acute Renal Failure - Cr 7.2 / K 5.0 by ER labs 08/14/18 on eval sepsis. [place] and CT w/o hydro or distend bladder. Baseline Cr 1.1 as recently as 07/2018. Resolved back to baseline with hydration and conclusion of SIRS.   3 - Sepsis of GI/GU Origin with Prostatitis / Epididymitis / Colitis / Severe Penoscrotal Infection - lactate 3.8, WBC 42k by ET labs 08/14/18 on eval fevers and malaise. UA unremarkable, UCX negative. [place] 1/3 [place] Scrotal US and Pelvic CT with inflmmation of epidydimis / prostate / peri-rectal gas c/w likely infection stemming from colon microperf seeding prostate and GU tract. Initially had good systemic response to IV ABX but worsened penoscrotal skin swelling and then some purlulent drainage 1/7, Repeat CT 1/7 with some progression of peri-rectal gas and new displacement of prior fiducial marker (confirming hole in rectum) and still no large fluid collections / sub-Q gas and exam with new draining sinus tracts at penoscrotal junction. 1st stage OR penoscrotal wound debridment 1/8, 3 large penrose drains place, approx 5cm2 non-viable skin removed. New Wound CX 1/8 - skin flora. +MRSA screen. Repeat debridement 1/13 and additional 4cm2 penile shaft tissue removed, drains replaced, end colostomy by Dr. Gershon Crane. Repeat debridement 1/16, no further tissue removed.   4 - Rectal Ulcer/Suspect Perforation - known DX rectal ulcer with periodic bleeding following prostate radiation 2018. Flex-Sig / BX 06/2018 of ulcer edges negative for malignancy. CT 08/14/18 with gas in peri-rectal / peri-prostatic space c/w likely microperforation. Lap end colostomy performed by gen surg 1/13.   Today " Phillip Blackwell" continues  to improve. Colostomy functioning well. MUCH improved local wound drainage.   Objective: Vital signs in last 24 hours: Temp:  [98.6 F (37 C)-99.7 F (37.6 C)] 98.6 F (37 C) (01/21 0600) Pulse Rate:  [68-78] 76 (01/21 0600) Resp:  [20] 20 (01/20 1356) BP: (147-170)/(72-83) 149/72 (01/21 0600) SpO2:  [96 %-100 %] 96 % (01/21 0600) Last BM Date: 08/30/18  Intake/Output from previous day: 01/20 0701 - 01/21 0700 In: 1286.4 [P.O.:320; I.V.:566.4; IV Piggyback:400] Out: 2895 [Urine:2895] Intake/Output this shift: No intake/output data recorded.   EXAM: NAD in pre-op holding, family at bedside. In much better spirits.  Head: Normocephalic.  Eyes: Pupils are equal, round, and reactive to light.  Neck: Normal range of motion.  Cardiovascular: regular rate.  Respiratory:  Non-labored on room air GI: Soft. There is no abdominal tenderness. There is no rebound and no guarding. LLQ with some green liquid and gas.  Genitourinary:    Genitourinary Comments: Recent surgical drians with drastically improved, now scant purlulence and local swelling of penis, scrotum, perineum. Musculoskeletal: Normal range of motion.  Neurological: He is alert.  Skin: Skin is warm.  Psychiatric: He has a normal mood and affect. His behavior is normal.  Lab Results:  Recent Labs    08/31/18 0417  WBC 11.6*  HGB 7.9*  HCT 25.8*  PLT 320   BMET Recent Labs    08/31/18 0417  NA 136  K 3.5  CL 106  CO2 23  GLUCOSE 112*  BUN 9  CREATININE 1.15  CALCIUM 8.1*   PT/INR Recent Labs    08/31/18 0417  LABPROT 16.0*  INR 1.30   ABG No results for input(s): PHART, HCO3 in the last 72 hours.  Invalid input(s): PCO2, PO2  Studies/Results: No results found.  Anti-infectives: Anti-infectives (From admission, onward)   Start     Dose/Rate Route Frequency Ordered Stop   08/23/18 1500  Ampicillin-Sulbactam (UNASYN) 3 g in sodium chloride 0.9 % 100 mL IVPB     3 g 200 mL/hr over 30 Minutes  Intravenous Every 6 hours 08/23/18 1434     08/23/18 0845  Ampicillin-Sulbactam (UNASYN) 3 g in sodium chloride 0.9 % 100 mL IVPB     3 g 200 mL/hr over 30 Minutes Intravenous To [facility]Stay [facility] 08/23/18 0839 08/23/18 1018   08/20/18 2200  ceFEPIme (MAXIPIME) 2 g in sodium chloride 0.9 % 100 mL IVPB  Status:  Discontinued     2 g 200 mL/hr over 30 Minutes Intravenous Every 12 hours 08/20/18 1315 08/20/18 1406   08/20/18 1500  Ampicillin-Sulbactam (UNASYN) 3 g in sodium chloride 0.9 % 100 mL IVPB  Status:  Discontinued     3 g 200 mL/hr over 30 Minutes Intravenous Every 6 hours 08/20/18 1420 08/23/18 0839   08/17/18 1400  metroNIDAZOLE (FLAGYL) tablet 500 mg  Status:  Discontinued     500 mg Oral Every 8 hours 08/17/18 1303 08/20/18 1406   08/16/18 2000  ceFEPIme (MAXIPIME) 2 g in sodium chloride 0.9 % 100 mL IVPB  Status:  Discontinued     2 g 200 mL/hr over 30 Minutes Intravenous Every 24 hours 08/16/18 1328 08/20/18 1315   08/15/18 1030  vancomycin (VANCOCIN) 1,250 mg in sodium chloride 0.9 % 250 mL IVPB     1,250 mg 166.7 mL/hr over 90 Minutes Intravenous  Once 08/15/18 0942 08/15/18 1444   08/14/18 2000  ceFEPIme (MAXIPIME) 1 g in sodium chloride 0.9 % 100 mL IVPB  Status:  Discontinued     1 g 200 mL/hr over 30 Minutes Intravenous Every 24 hours 08/13/18 2052 08/16/18 1328   08/13/18 2052  vancomycin variable dose per unstable renal function (pharmacist dosing)  Status:  Discontinued      Does not apply See admin instructions 08/13/18 2052 08/16/18 1442   08/13/18 2045  ceFEPIme (MAXIPIME) 2 g in sodium chloride 0.9 % 100 mL IVPB     2 g 200 mL/hr over 30 Minutes Intravenous  Once 08/13/18 2038 08/13/18 2141   08/13/18 2045  metroNIDAZOLE (FLAGYL) IVPB 500 mg  Status:  Discontinued     500 mg 100 mL/hr over 60 Minutes Intravenous Every 8 hours 08/13/18 2038 08/17/18 1303   08/13/18 2045  vancomycin (VANCOCIN) IVPB 1000 mg/200 mL premix     1,000 mg 200 mL/hr over 60 Minutes  Intravenous  Once 08/13/18 2038 08/13/18 2333      Assessment/Plan:  1 - Prostate Cancer, Likely Recurrent - PSA pattern most c/w active disease, but no signs of metastatic involvement. May warrant tissue sampling via transperineal approach in elective setting. No further diagnostics or intervention this admission.   2 - Acute Renal Failure - Resolved,  likely pre-renal due to sepsis. No hydro to warrant stenting / neph tubes. Rec continued foley for accurate UOP monitoring.  3 - Sepsis of GI/GU Origin with Prostatitis / Epididymitis / Colitis / Severe Penoscrotal Infection - Improving sytemically and locally on IV ABX and s/p staged local wound debridement and end colostomy.   I feel one more debridement is necessary, will try to remove all surgical drains and convert wound to wet  to dry management which should be amenable to home health.  Plan for Wed PM. Will likely need eventual plastic surgery help pending amount of granulation that occurs on the wet to dry regimen.   4 - Rectal Ulcer/Suspect Perforation - likely from progression of known radiation-induced rectal ulcer. Now s/p fecal diversion which should drastically (but likely not completely) reduce output across perforation.   WIll follow, please call me directly anytime with questions.   Alexis Frock 08/31/2018

## 2018-08-31 NOTE — Progress Notes (Signed)
PROGRESS NOTE        PATIENT DETAILS Name: Phillip Blackwell Age: 58 y.o. Sex: male Date of Birth: 02/04/1961 Admit Date: 08/13/2018 Admitting Physician Shela Leff, MD [id number], Pcp Not In  Brief Narrative: Patient is a 58 y.o. male with history of hypertension, BPH, OSA prostate cancer status post external beam radiation-presented with sepsis secondary to rectal ulcer with perforation with seeding of the GU tract causing epididymitis/prostatitis.  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 Further hospital course complicated by worsening penile/scrotal infection and AKI.  Evaluated by general surgery and urology-underwent diverting colostomy, and has had multiple trips to the OR for scrotal debridement.  See below for further details   Subjective:  Patient in bed, appears comfortable, denies any headache, no fever, no chest pain or pressure, no shortness of breath , no abdominal pain. No focal weakness.   Assessment/Plan:  Severe sepsis secondary to perforated rectal ulcer with prostatitis/epididymitis and penoscrotal abscess along with eggerthella lenta bacteremia: Sepsis pathophysiology has resolved, he is also undergone diverting colostomy which appears stable, seen by urology underwent scrotal debridement on 08/23/2018 along with 08/27/2018, I discussed his case with urologist Dr. Tammi Klippel on 08/31/2018, he will be taking the patient to the OR one last time most likely on 09/01/2018.  [place] on Unasyn.  Will defer changing to oral medications to urology, previous MD had discussed the case with ID physician Dr. Megan Salon as well.  He was initially supposed to get antibiotics for 3 weeks which will end on 09/03/2018.  AKI: This was hemodynamically mediated ATN which has resolved after supportive care and hydration.   Multifactorial anemia secondary to acute blood loss anemia and secondary to acute/critical illness: No further hematochezia-hemoglobin  levels are relatively stable-no further hematochezia, suspicion for acute illness causing him to have persistent anemia rather than further GI bleeding.  Has been transfused 1 unit of PRBC so far, follow CBC periodically.   Hyperbilirubinemia: Due to sepsis resolved, CT scan unremarkable from hepatobiliary standpoint.       Hypertension: Controlled-continue metoprolol, prazosin.   Dyslipidemia: Continue statin  BPH: Continue Flomax  OSA: CPAP nightly-but per RT note-patient has been refusing lately-we will counsel  Moderate protein calorie malnutrition: Continue supplements  Acute debility/deconditioning: Likely secondary to acute illness-initially plans were for SNF placement-however patient refusing SNF and wants to go home.  Will need maximum home health services on discharge    DVT Prophylaxis: SCD's  Code Status: Full code  Family Communication: Wife bedside on 08/29/2018, updated with the plan.  Disposition Plan: Remain inpatient-requires several more days of hospitalization before consideration of discharge  Antimicrobial agents: Anti-infectives (From admission, onward)   Start     Dose/Rate Route Frequency Ordered Stop   08/23/18 1500  Ampicillin-Sulbactam (UNASYN) 3 g in sodium chloride 0.9 % 100 mL IVPB     3 g 200 mL/hr over 30 Minutes Intravenous Every 6 hours 08/23/18 1434     08/23/18 0845  Ampicillin-Sulbactam (UNASYN) 3 g in sodium chloride 0.9 % 100 mL IVPB     3 g 200 mL/hr over 30 Minutes Intravenous To [facility] 08/23/18 0839 08/23/18 1018   08/20/18 2200  ceFEPIme (MAXIPIME) 2 g in sodium chloride 0.9 % 100 mL IVPB  Status:  Discontinued     2 g 200 mL/hr over 30 Minutes Intravenous Every 12 hours 08/20/18 1315  08/20/18 1406   08/20/18 1500  Ampicillin-Sulbactam (UNASYN) 3 g in sodium chloride 0.9 % 100 mL IVPB  Status:  Discontinued     3 g 200 mL/hr over 30 Minutes Intravenous Every 6 hours 08/20/18 1420 08/23/18 0839   08/17/18 1400   metroNIDAZOLE (FLAGYL) tablet 500 mg  Status:  Discontinued     500 mg Oral Every 8 hours 08/17/18 1303 08/20/18 1406   08/16/18 2000  ceFEPIme (MAXIPIME) 2 g in sodium chloride 0.9 % 100 mL IVPB  Status:  Discontinued     2 g 200 mL/hr over 30 Minutes Intravenous Every 24 hours 08/16/18 1328 08/20/18 1315   08/15/18 1030  vancomycin (VANCOCIN) 1,250 mg in sodium chloride 0.9 % 250 mL IVPB     1,250 mg 166.7 mL/hr over 90 Minutes Intravenous  Once 08/15/18 0942 08/15/18 1444   08/14/18 2000  ceFEPIme (MAXIPIME) 1 g in sodium chloride 0.9 % 100 mL IVPB  Status:  Discontinued     1 g 200 mL/hr over 30 Minutes Intravenous Every 24 hours 08/13/18 2052 08/16/18 1328   08/13/18 2052  vancomycin variable dose per unstable renal function (pharmacist dosing)  Status:  Discontinued      Does not apply See admin instructions 08/13/18 2052 08/16/18 1442   08/13/18 2045  ceFEPIme (MAXIPIME) 2 g in sodium chloride 0.9 % 100 mL IVPB     2 g 200 mL/hr over 30 Minutes Intravenous  Once 08/13/18 2038 08/13/18 2141   08/13/18 2045  metroNIDAZOLE (FLAGYL) IVPB 500 mg  Status:  Discontinued     500 mg 100 mL/hr over 60 Minutes Intravenous Every 8 hours 08/13/18 2038 08/17/18 1303   08/13/18 2045  vancomycin (VANCOCIN) IVPB 1000 mg/200 mL premix     1,000 mg 200 mL/hr over 60 Minutes Intravenous  Once 08/13/18 2038 08/13/18 2333      Procedures: None  CONSULTS:  general surgery and urology  Time spent: 25 minutes-Greater than 50% of this time was spent in counseling, explanation of diagnosis, planning of further management, and coordination of care.  MEDICATIONS: Scheduled Meds: . atorvastatin  40 mg Oral Q supper  . cholecalciferol  2,000 Units Oral Daily  . feeding supplement  1 Container Oral BID BM  . feeding supplement (PRO-STAT SUGAR FREE 64)  30 mL Oral BID  . ferrous sulfate  325 mg Oral Q breakfast  . metoprolol tartrate  100 mg Oral BID  . multivitamin with minerals  1 tablet Oral Daily   . mupirocin ointment   Nasal BID  . pantoprazole  40 mg Oral BID AC  . polycarbophil  625 mg Oral BID  . prazosin  2 mg Oral QHS   Continuous Infusions: . sodium chloride 10 mL/hr at 08/30/18 0648  . ampicillin-sulbactam (UNASYN) IV 3 g (08/31/18 0323)  . lactated ringers 10 mL/hr at 08/18/18 2212  . lactated ringers 10 mL/hr at 08/25/18 1729  . magnesium sulfate 1 - 4 g bolus IVPB 2 g (08/31/18 1007)   PRN Meds:.sodium chloride, acetaminophen **OR** acetaminophen, clonazePAM, ondansetron (ZOFRAN) IV, oxyCODONE, simethicone, tamsulosin   PHYSICAL EXAM: Vital signs: Vitals:   08/30/18 0935 08/30/18 1356 08/30/18 2155 08/31/18 0600  BP: 132/66 (!) 147/72 (!) 170/83 (!) 149/72  Pulse: 79 68 78 76  Resp:  20    Temp:  99.7 F (37.6 C) 99.4 F (37.4 C) 98.6 F (37 C)  TempSrc:  Oral Oral Oral  SpO2:  100% 99% 96%  Weight:  Height:       Filed Weights   08/13/18 1922 08/13/18 2234 08/26/18 1436  Weight: 86.2 kg 86.2 kg 86 kg   Body mass index is 25.71 kg/m.   Exam  Awake Alert, Oriented X 3, No new F.N deficits, Normal affect [place].AT,PERRAL Supple Neck,No JVD, No cervical lymphadenopathy appriciated.  Symmetrical Chest wall movement, Good air movement bilaterally, CTAB RRR,No Gallops, Rubs or new Murmurs, No Parasternal Heave +ve B.Sounds, Abd Soft, No tenderness, No organomegaly appriciated, No rebound - guarding or rigidity. No Cyanosis, Clubbing or edema, No new Rash or bruise Foley catheter in place, scrotal postop site under bandage.   I have personally reviewed following labs and imaging studies  LABORATORY DATA: CBC: Recent Labs  Lab 08/26/18 0301 08/26/18 1618 08/27/18 0430 08/28/18 0452 08/31/18 0417  WBC 9.8  --  11.7* 10.5 11.6*  HGB 7.8* 8.5* 8.7* 8.4* 7.9*  HCT 24.3* 25.0* 27.1* 27.5* 25.8*  MCV 90.3  --  92.5 91.7 91.8  PLT 499*  --  455* 428* 161    Basic Metabolic Panel: Recent Labs  Lab 08/26/18 0301 08/26/18 1618 08/27/18 0430  08/28/18 0452 08/31/18 0417  NA 136 139 138 140 136  K 3.9 3.9 4.8 3.9 3.5  CL 106  --  105 107 106  CO2 23  --  25 24 23   GLUCOSE 113* 93 135* 106* 112*  BUN 11  --  13 12 9   CREATININE 1.27*  --  1.29* 1.10 1.15  CALCIUM 8.1*  --  8.2* 8.2* 8.1*  MG  --   --   --   --  1.6*    GFR: Estimated Creatinine Clearance: 75.9 mL/min (by C-G formula based on SCr of 1.15 mg/dL).  Liver Function Tests: No results for input(s): AST, ALT, ALKPHOS, BILITOT, PROT, ALBUMIN in the last 168 hours. No results for input(s): LIPASE, AMYLASE in the last 168 hours. No results for input(s): AMMONIA in the last 168 hours.  Coagulation Profile: Recent Labs  Lab 08/31/18 0417  INR 1.30    Cardiac Enzymes: No results for input(s): CKTOTAL, CKMB, CKMBINDEX, TROPONINI in the last 168 hours.  BNP (last 3 results) No results for input(s): PROBNP in the last 8760 hours.  HbA1C: No results for input(s): HGBA1C in the last 72 hours.  CBG: No results for input(s): GLUCAP in the last 168 hours.  Lipid Profile: No results for input(s): CHOL, HDL, LDLCALC, TRIG, CHOLHDL, LDLDIRECT in the last 72 hours.  Thyroid Function Tests: No results for input(s): TSH, T4TOTAL, FREET4, T3FREE, THYROIDAB in the last 72 hours.  Anemia Panel: No results for input(s): VITAMINB12, FOLATE, FERRITIN, TIBC, IRON, RETICCTPCT in the last 72 hours.  Urine analysis:    Component Value Date/Time   COLORURINE AMBER (A) 08/13/2018 2010   APPEARANCEUR CLOUDY (A) 08/13/2018 2010   LABSPEC 1.014 08/13/2018 2010   PHURINE 5.0 08/13/2018 2010   GLUCOSEU NEGATIVE 08/13/2018 2010   HGBUR NEGATIVE 08/13/2018 2010   [place] NEGATIVE 08/13/2018 2010   [place] NEGATIVE 08/13/2018 2010   PROTEINUR 30 (A) 08/13/2018 2010   NITRITE NEGATIVE 08/13/2018 2010   LEUKOCYTESUR NEGATIVE 08/13/2018 2010    Sepsis Labs: Lactic Acid, Venous    Component Value Date/Time   LATICACIDVEN 1.95 (H) 08/13/2018 2313     MICROBIOLOGY: No results found for this or any previous visit (from the past 240 hour(s)).  RADIOLOGY STUDIES/RESULTS: Ct Abdomen Pelvis Wo Contrast  Result Date: 08/13/2018 CLINICAL DATA:  Groin swelling EXAM: CT ABDOMEN  AND PELVIS WITHOUT CONTRAST TECHNIQUE: Multidetector CT imaging of the abdomen and pelvis was performed following the standard protocol without IV contrast. COMPARISON:  MRI 07/10/2018 FINDINGS: Lower chest: Lung bases demonstrate no acute consolidation or effusion. The heart size is normal. Small hiatal hernia. Hepatobiliary: No focal liver abnormality is seen. No gallstones, gallbladder wall thickening, or biliary dilatation. Pancreas: Unremarkable. No pancreatic ductal dilatation or surrounding inflammatory changes. Spleen: Normal in size without focal abnormality. Adrenals/Urinary Tract: Adrenal glands are within normal limits. No hydronephrosis. Thick-walled urinary bladder Stomach/Bowel: Stomach is nonenlarged. No dilated small bowel. Extensive gas collection Between the rectum and prostate. Vascular/Lymphatic: Mild aortic atherosclerosis. No aneurysm. No significantly enlarged lymph nodes. Reproductive: Metallic densities along the posterior aspect of the prostate. Large gas collections around the prostate gland. Moderate edema of the scrotum and around the penis. Other: No free air. Fat in the left inguinal canal. Edema within the pubic soft tissues. Gas and fluid collection extending from the rectal area and along both sides of the prostate gland. Musculoskeletal: No acute or suspicious abnormality. IMPRESSION: 1. Moderate edema and mild inflammatory changes involving the scrotum and soft tissues about the penis, but without soft tissue emphysema. Mild edema within the pubic soft tissues. 2. Abnormal gas and fluid collection between the rectum and prostate gland and extending along the right and left aspects of the prostate gland, felt to correspond to previously demonstrated  rectal ulcer and sinus tracks. 3. Thick-walled appearance of the urinary bladder, possible cystitis Electronically Signed   By: Donavan Foil M.D.   On: 08/13/2018 23:19   Dg Chest 2 View  Result Date: 08/13/2018 CLINICAL DATA:  Acute onset of genital swelling. Fever. EXAM: CHEST - 2 VIEW COMPARISON:  Chest radiograph performed 07/07/2018 FINDINGS: The lungs are well-aerated. Minimal left basilar atelectasis is noted. There is no evidence of pleural effusion or pneumothorax. The heart is normal in size; the mediastinal contour is within normal limits. No acute osseous abnormalities are seen. IMPRESSION: Minimal left basilar atelectasis noted. Lungs otherwise clear. Electronically Signed   By: Garald Balding M.D.   On: 08/13/2018 22:16   Ct Pelvis Wo Contrast  Result Date: 08/17/2018 CLINICAL DATA:  Proctitis with micro perforation, now with abscess at penile shaft, worsening pelvic swelling EXAM: CT PELVIS WITHOUT CONTRAST TECHNIQUE: Multidetector CT imaging of the pelvis was performed following the standard protocol without intravenous contrast. COMPARISON:  CT abdomen/pelvis dated 09/09/2018. MR pelvis dated 07/10/2018. FINDINGS: Motion degraded images. Urinary Tract: Thick-walled bladder with indwelling Foley catheter and nondependent gas. Bowel: Visualized bowel is poorly evaluated but grossly unremarkable. Vascular/Lymphatic: Mild atherosclerotic calcifications of the bilateral iliac vessels. No suspicious pelvic lymphadenopathy. Reproductive: Gas within the bilateral seminal vesicles. Additional gas along the retro prostatic soft tissues (series 3/image 37) and extending along the right pelvic sidewall (series 3/image 31), unchanged. Fiducial radiation markers along the posterior prostate. One of these markers is now displaced posterior to the rectum (series 3/image 29), new from recent CT. Other: Subcutaneous stranding along the anterior lower pelvic wall, left greater than right (series 3/image 32),  progressive. Additional fluid/stranding along the proximal penile shaft (series 3/image 54) with diffuse scrotal edema (series 3/image 68). This appearance is compatible with cellulitis. No drainable fluid collection/abscess on CT. Musculoskeletal: Visualized osseous structures are within normal limits. IMPRESSION: Gas along the bilateral seminal vesicles and retro prostatic soft tissues, likely related to known rectal microperforation, unchanged. Associated cellulitis involving the lower pelvic wall, penile shaft, and scrotum. No drainable fluid collection/abscess on  CT. Displaced fiducial radiation marker now posterior to the rectum, new from recent CT. Electronically Signed   By: Julian Hy M.D.   On: 08/17/2018 11:34   US Renal  Result Date: 08/14/2018 CLINICAL DATA:  Acute renal failure. EXAM: RENAL / URINARY TRACT ULTRASOUND COMPLETE COMPARISON:  Noncontrast CT yesterday. FINDINGS: Right Kidney: Renal measurements: 12.6 x 5.2 x 6.1 cm = volume: 207 mL . Echogenicity within normal limits. No mass or hydronephrosis visualized. Left Kidney: Renal measurements: 12.8 x 5.1 x 5.7 cm = volume: 195 mL. Echogenicity within normal limits. No mass or hydronephrosis visualized. Bladder: Bladder wall thickening of 9 mm, as seen on CT. IMPRESSION: 1. Bladder wall thickening, similar to CT yesterday. 2. Unremarkable sonographic appearance of both kidneys. No hydronephrosis. Electronically Signed   By: Keith Rake M.D.   On: 08/14/2018 03:48   US Scrotum W/doppler  Result Date: 08/13/2018 CLINICAL DATA:  Initial evaluation for acute testicular pain, swelling. EXAM: SCROTAL ULTRASOUND DOPPLER ULTRASOUND OF THE TESTICLES TECHNIQUE: Complete ultrasound examination of the testicles, epididymis, and other scrotal structures was performed. Color and spectral Doppler ultrasound were also utilized to evaluate blood flow to the testicles. COMPARISON:  None. FINDINGS: Right testicle Measurements: 3.5 x 2.5 x 1.9 cm.  No mass lesion. Testicular microlithiasis noted. Left testicle Measurements: 3.9 x 2.2 x 2.3 cm. No mass lesion. Testicular microlithiasis noted. Right epididymis:  Normal in size and appearance. Left epididymis: Diffusely increased vascularity seen within the left epididymis, suggesting acute epididymitis. Hydrocele:  Bilateral hydroceles noted. Varicocele:  Bilateral varicoceles noted. Pulsed Doppler interrogation of both testes demonstrates normal low resistance arterial and venous waveforms bilaterally. Incidental note made of a probable small fat containing right inguinal hernia. IMPRESSION: 1. Increased vascularity within the left epididymis, suggesting acute epididymitis. No evidence for testicular torsion. 2. Small moderate bilateral hydroceles, likely reactive. 3. Small bilateral varicoceles. 4. Probable small fat containing right inguinal hernia. 5. Testicular microlithiasis. Current literature suggests that testicular microlithiasis is not a significant independent risk factor for development of testicular carcinoma, and that follow up imaging is not warranted in the absence of other risk factors. Monthly testicular self-examination and annual physical exams are considered appropriate surveillance. If patient has other risk factors for testicular carcinoma, then referral to Urology should be considered. (Reference: DeCastro, et al.: A 5-Year Follow up Study of Asymptomatic Men with Testicular Microlithiasis. J Urol 2008; 177:1165-7903.) Electronically Signed   By: Jeannine Boga M.D.   On: 08/13/2018 22:48   US Abdomen Limited Ruq  Result Date: 08/14/2018 CLINICAL DATA:  Initial evaluation for elevated AST with leukocytosis. EXAM: ULTRASOUND ABDOMEN LIMITED RIGHT UPPER QUADRANT COMPARISON:  None. FINDINGS: Gallbladder: No gallstones or wall thickening visualized. No sonographic Murphy sign noted by sonographer. Common bile duct: Diameter: 4.6 mm Liver: No focal lesion identified. Within normal  limits in parenchymal echogenicity. Portal vein is patent on color Doppler imaging with normal direction of blood flow towards the liver. IMPRESSION: Normal right upper quadrant ultrasound. No cholelithiasis, evidence for acute cholecystitis, or biliary dilatation. Electronically Signed   By: Jeannine Boga M.D.   On: 08/14/2018 03:43     LOS: 17 days    Signature  Lala Lund M.D on 08/31/2018 at 10:09 AM   -  To page go to [email or web address]

## 2018-08-31 NOTE — Progress Notes (Signed)
Physical Therapy Treatment Patient Details Name: Phillip Blackwell MRN: [id number] DOB: [birth year] Today's Date: 08/31/2018    History of Present Illness Pt is a 58 y.o. male with medical history significant of anxiety, depression, GERD, hypertension, CVA, OSA presenting to the hospital for evaluation of groin pain and swelling , fever, blood in stool, work-up was significant for sepsis, acute blood loss anemia, is admitted for further work-up, this is secondary to colitis/prostatitis/epididymitis from perforated rectal ulcer.  Pt is s/p ostomy and muktiple I and D to penis/scrotal area.     PT Comments    Pt continues to improve and progress.  Pt is requiring supervision overall and min assistance to climb stairs with assistance needed only to ground RW.  Pt continues to benefit from HHPT at d/c to continue progression of functional mobility.  Plan next session for progression to standing exercises to improve strength and challenge balance.    Follow Up Recommendations  Supervision/Assistance - 24 hour;Home health PT     Equipment Recommendations  Rolling walker with 5" wheels    Recommendations for Other Services       Precautions / Restrictions Precautions Precautions: Fall Restrictions Weight Bearing Restrictions: No    Mobility  Bed Mobility Overal bed mobility: Needs Assistance Bed Mobility: Supine to Sit     Supine to sit: Supervision     General bed mobility comments: Pt performed advancement to edge of bed and able to advance unassisted.     Transfers Overall transfer level: Needs assistance Equipment used: Rolling walker (2 wheeled) Transfers: Sit to/from Stand Sit to Stand: Supervision         General transfer comment: Pt performed from standard height bed without assistance.  Pt better balanced on edge of bed.    Ambulation/Gait Ambulation/Gait assistance: Min guard Gait Distance (Feet): 220 Feet Assistive device: Rolling walker (2 wheeled) Gait  Pattern/deviations: Step-through pattern;Trunk flexed     General Gait Details: Pt with more narrow BOS but WFL.  Pt required cues for scapular retraction and upper trunk control.  Begins to gaze down with gait training.     Stairs Stairs: Yes Stairs assistance: Min assist Stair Management: No rails;With walker Number of Stairs: 2 General stair comments: Cues for sequencing and RW placement on stairs to climb.  Pt educated on technique and feels confident to pass information along to his wife   Wheelchair Mobility    Modified Rankin (Stroke Patients Only)       Balance Overall balance assessment: Needs assistance   Sitting balance-Leahy Scale: Fair       Standing balance-Leahy Scale: Poor                              Cognition Arousal/Alertness: Awake/alert Behavior During Therapy: WFL for tasks assessed/performed Overall Cognitive Status: Within Functional Limits for tasks assessed                                        Exercises      General Comments        Pertinent Vitals/Pain Pain Assessment: 0-10 Pain Score: 7  Pain Location: scrotal and rectal pain, denies stomach pain.   Pain Descriptors / Indicators: Aching;Grimacing;Guarding Pain Intervention(s): Monitored during session;Repositioned    Home Living  Prior Function            PT Goals (current goals can now be found in the care plan section) Acute Rehab PT Goals Patient Stated Goal: to get stronger Potential to Achieve Goals: Good Progress towards PT goals: Progressing toward goals    Frequency    Min 3X/week      PT Plan Current plan remains appropriate    Co-evaluation              AM-PAC PT "6 Clicks" Mobility   Outcome Measure  Help needed turning from your back to your side while in a flat bed without using bedrails?: A Little Help needed moving from lying on your back to sitting on the side of a flat bed  without using bedrails?: A Little Help needed moving to and from a bed to a chair (including a wheelchair)?: A Little Help needed standing up from a chair using your arms (e.g., wheelchair or bedside chair)?: A Little Help needed to walk in hospital room?: A Little Help needed climbing 3-5 steps with a railing? : A Little 6 Click Score: 18    End of Session Equipment Utilized During Treatment: Gait belt Activity Tolerance: Patient limited by pain Patient left: in bed;with call bell/phone within reach;with bed alarm set Nurse Communication: Mobility status PT Visit Diagnosis: Muscle weakness (generalized) (M62.81);Difficulty in walking, not elsewhere classified (R26.2)     Time: 1203-1226 PT Time Calculation (min) (ACUTE ONLY): 23 min  Charges:  $Gait Training: 23-37 mins                     [name], PTA Acute Rehabilitation Services Pager [phone] Office [phone]     Kahleel Fadeley Eli Hose 08/31/2018, 1:48 PM

## 2018-09-01 ENCOUNTER — Encounter (HOSPITAL_COMMUNITY)
Admission: EM | Disposition: A | Discharge status: Home Health | Payer: Self-pay | Source: Home / Self Care | Attending: Internal Medicine

## 2018-09-01 ENCOUNTER — Encounter (HOSPITAL_COMMUNITY): Payer: Self-pay | Admitting: *Deleted

## 2018-09-01 ENCOUNTER — Inpatient Hospital Stay (HOSPITAL_COMMUNITY): Payer: No Typology Code available for payment source | Admitting: Anesthesiology

## 2018-09-01 HISTORY — PX: IRRIGATION AND DEBRIDEMENT ABSCESS: SHX5252

## 2018-09-01 LAB — MAGNESIUM: Magnesium: 1.9 mg/dL (ref 1.7–2.4)

## 2018-09-01 SURGERY — IRRIGATION AND DEBRIDEMENT ABSCESS
Anesthesia: General

## 2018-09-01 MED ORDER — ONDANSETRON HCL 4 MG/2ML IJ SOLN
INTRAMUSCULAR | Status: AC
  Filled 2018-09-01: qty 2

## 2018-09-01 MED ORDER — POTASSIUM CHLORIDE IN NACL 40-0.9 MEQ/L-% IV SOLN
INTRAVENOUS | Status: AC
  Administered 2018-09-01: 50 mL/h via INTRAVENOUS
  Filled 2018-09-01: qty 1000

## 2018-09-01 MED ORDER — LIDOCAINE HCL (CARDIAC) PF 100 MG/5ML IV SOSY
PREFILLED_SYRINGE | INTRAVENOUS | Status: DC | PRN
  Administered 2018-09-01: 80 mg via INTRATRACHEAL

## 2018-09-01 MED ORDER — MIDAZOLAM HCL 2 MG/2ML IJ SOLN
INTRAMUSCULAR | Status: AC
  Filled 2018-09-01: qty 2

## 2018-09-01 MED ORDER — ONDANSETRON HCL 4 MG/2ML IJ SOLN
INTRAMUSCULAR | Status: DC | PRN
  Administered 2018-09-01: 4 mg via INTRAVENOUS

## 2018-09-01 MED ORDER — HYDROMORPHONE HCL 1 MG/ML IJ SOLN
INTRAMUSCULAR | Status: AC
  Administered 2018-09-01: 0.5 mg via INTRAVENOUS
  Filled 2018-09-01: qty 1

## 2018-09-01 MED ORDER — PHENYLEPHRINE 40 MCG/ML (10ML) SYRINGE FOR IV PUSH (FOR BLOOD PRESSURE SUPPORT)
PREFILLED_SYRINGE | INTRAVENOUS | Status: AC
  Filled 2018-09-01: qty 10

## 2018-09-01 MED ORDER — HYDROMORPHONE HCL 1 MG/ML IJ SOLN
0.2500 mg | INTRAMUSCULAR | Status: DC | PRN
  Administered 2018-09-01 (×4): 0.5 mg via INTRAVENOUS

## 2018-09-01 MED ORDER — PROPOFOL 10 MG/ML IV BOLUS
INTRAVENOUS | Status: AC
  Filled 2018-09-01: qty 40

## 2018-09-01 MED ORDER — LACTATED RINGERS IV SOLN: INTRAVENOUS | Status: DC

## 2018-09-01 MED ORDER — 0.9 % SODIUM CHLORIDE (POUR BTL) OPTIME
TOPICAL | Status: DC | PRN
  Administered 2018-09-01: 1000 mL

## 2018-09-01 MED ORDER — GLYCOPYRROLATE 0.2 MG/ML IJ SOLN
INTRAMUSCULAR | Status: DC | PRN
  Administered 2018-09-01: 0.1 mg via INTRAVENOUS

## 2018-09-01 MED ORDER — MIDAZOLAM HCL 2 MG/2ML IJ SOLN
INTRAMUSCULAR | Status: DC | PRN
  Administered 2018-09-01 (×2): 1 mg via INTRAVENOUS

## 2018-09-01 MED ORDER — FENTANYL CITRATE (PF) 250 MCG/5ML IJ SOLN
INTRAMUSCULAR | Status: DC | PRN
  Administered 2018-09-01: 75 ug via INTRAVENOUS
  Administered 2018-09-01 (×3): 25 ug via INTRAVENOUS

## 2018-09-01 MED ORDER — LIDOCAINE 2% (20 MG/ML) 5 ML SYRINGE
INTRAMUSCULAR | Status: AC
  Filled 2018-09-01: qty 5

## 2018-09-01 MED ORDER — DEXAMETHASONE SODIUM PHOSPHATE 10 MG/ML IJ SOLN
INTRAMUSCULAR | Status: AC
  Filled 2018-09-01: qty 1

## 2018-09-01 MED ORDER — PHENYLEPHRINE HCL 10 MG/ML IJ SOLN
INTRAMUSCULAR | Status: DC | PRN
  Administered 2018-09-01 (×3): 80 ug via INTRAVENOUS

## 2018-09-01 MED ORDER — PROPOFOL 10 MG/ML IV BOLUS
INTRAVENOUS | Status: DC | PRN
  Administered 2018-09-01: 180 mg via INTRAVENOUS

## 2018-09-01 MED ORDER — DEXAMETHASONE SODIUM PHOSPHATE 10 MG/ML IJ SOLN
INTRAMUSCULAR | Status: DC | PRN
  Administered 2018-09-01: 10 mg via INTRAVENOUS

## 2018-09-01 MED ORDER — ONDANSETRON HCL 4 MG/2ML IJ SOLN: 4.0000 mg | Freq: Once | INTRAMUSCULAR | Status: DC | PRN

## 2018-09-01 MED ORDER — LACTATED RINGERS IV SOLN
INTRAVENOUS | Status: DC
  Administered 2018-09-01: 16:00:00 via INTRAVENOUS

## 2018-09-01 MED ORDER — MEPERIDINE HCL 50 MG/ML IJ SOLN: 6.2500 mg | INTRAMUSCULAR | Status: DC | PRN

## 2018-09-01 MED ORDER — BUPIVACAINE HCL (PF) 0.25 % IJ SOLN
INTRAMUSCULAR | Status: AC
  Filled 2018-09-01: qty 30

## 2018-09-01 MED ORDER — FENTANYL CITRATE (PF) 250 MCG/5ML IJ SOLN
INTRAMUSCULAR | Status: AC
  Filled 2018-09-01: qty 5

## 2018-09-01 SURGICAL SUPPLY — 36 items
BAG DECANTER FOR FLEXI CONT (MISCELLANEOUS) ×3 IMPLANT
BLADE 10 SAFETY STRL DISP (BLADE) ×3 IMPLANT
BLADE SURG 15 STRL LF DISP TIS (BLADE) ×1 IMPLANT
BLADE SURG 15 STRL SS (BLADE) ×3
BNDG GAUZE ELAST 4 BULKY (GAUZE/BANDAGES/DRESSINGS) ×2 IMPLANT
COVER SURGICAL LIGHT HANDLE (MISCELLANEOUS) ×6 IMPLANT
COVER WAND RF STERILE (DRAPES) ×3 IMPLANT
DRAPE HALF SHEET 40X57 (DRAPES) IMPLANT
DRAPE LAPAROTOMY T 102X78X121 (DRAPES) IMPLANT
ELECT REM PT RETURN 9FT ADLT (ELECTROSURGICAL) ×3
ELECTRODE REM PT RTRN 9FT ADLT (ELECTROSURGICAL) ×1 IMPLANT
GAUZE 4X4 16PLY RFD (DISPOSABLE) ×3 IMPLANT
GLOVE BIOGEL M STRL SZ7.5 (GLOVE) ×3 IMPLANT
GOWN STRL REUS W/ TWL LRG LVL3 (GOWN DISPOSABLE) ×2 IMPLANT
GOWN STRL REUS W/TWL LRG LVL3 (GOWN DISPOSABLE) ×6
HANDPIECE INTERPULSE COAX TIP (DISPOSABLE)
KIT BASIN OR (CUSTOM PROCEDURE TRAY) ×3 IMPLANT
KIT TURNOVER KIT B (KITS) ×3 IMPLANT
LEGGING LITHOTOMY PAIR STRL (DRAPES) IMPLANT
NDL HYPO 25X1 1.5 SAFETY (NEEDLE) IMPLANT
NEEDLE HYPO 25X1 1.5 SAFETY (NEEDLE) IMPLANT
NS IRRIG 1000ML POUR BTL (IV SOLUTION) ×3 IMPLANT
PACK SURGICAL SETUP 50X90 (CUSTOM PROCEDURE TRAY) ×3 IMPLANT
PAD ABD 8X10 STRL (GAUZE/BANDAGES/DRESSINGS) ×2 IMPLANT
PAD ARMBOARD 7.5X6 YLW CONV (MISCELLANEOUS) ×6 IMPLANT
PENCIL BUTTON HOLSTER BLD 10FT (ELECTRODE) ×3 IMPLANT
SET HNDPC FAN SPRY TIP SCT (DISPOSABLE) IMPLANT
SPONGE LAP 4X18 RFD (DISPOSABLE) IMPLANT
SUT CHROMIC 3 0 PS 2 (SUTURE) IMPLANT
SUT CHROMIC 3 0 SH 27 (SUTURE) IMPLANT
SUT CHROMIC 4 0 RB 1X27 (SUTURE) IMPLANT
SYR CONTROL 10ML LL (SYRINGE) ×3 IMPLANT
TUBE CONNECTING 12'X1/4 (SUCTIONS) ×1
TUBE CONNECTING 12X1/4 (SUCTIONS) ×2 IMPLANT
WATER STERILE IRR 1000ML POUR (IV SOLUTION) ×3 IMPLANT
YANKAUER SUCT BULB TIP NO VENT (SUCTIONS) ×3 IMPLANT

## 2018-09-01 NOTE — Progress Notes (Signed)
PROGRESS NOTE        PATIENT DETAILS Name: Phillip Blackwell Age: 58 y.o. Sex: male Date of Birth: July 08, 1961 Admit Date: 08/13/2018 Admitting Physician Shela Leff, MD [id number], Pcp Not In  Brief Narrative: Patient is a 58 y.o. male with history of hypertension, BPH, OSA prostate cancer status post external beam radiation-presented with sepsis secondary to rectal ulcer with perforation with seeding of the GU tract causing epididymitis/prostatitis.  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 Further hospital course complicated by worsening penile/scrotal infection and AKI.  Evaluated by general surgery and urology-underwent diverting colostomy, and has had multiple trips to the OR for scrotal debridement.  See below for further details   Subjective:  Patient in bed, appears comfortable, denies any headache, no fever, no chest pain or pressure, no shortness of breath , no abdominal pain. No focal weakness.  Assessment/Plan:  Severe sepsis secondary to perforated rectal ulcer with prostatitis/epididymitis and penoscrotal abscess along with eggerthella lenta bacteremia: Sepsis pathophysiology has resolved, he is also undergone diverting colostomy which appears stable, seen by urology underwent scrotal debridement on 08/23/2018 along with 08/27/2018, I discussed his case with urologist Dr. Tammi Klippel on 08/31/2018, he will be taking the patient to the OR one last time most likely on 09/01/2018.  [place] on Unasyn.  Will defer changing to oral medications to urology, previous MD had discussed the case with ID physician Dr. Megan Salon as well.  He was initially supposed to get antibiotics for 3 weeks which will end on 09/03/2018, we will reevaluate his clinical course after today's surgery.  AKI: This was hemodynamically mediated ATN which has resolved after supportive care and hydration.   Multifactorial anemia secondary to acute blood loss anemia and secondary to  acute/critical illness: No further hematochezia-hemoglobin levels are relatively stable-no further hematochezia, suspicion for acute illness causing him to have persistent anemia rather than further GI bleeding.  Has been transfused 1 unit of PRBC so far, follow CBC periodically.   Hyperbilirubinemia: Due to sepsis resolved, CT scan unremarkable from hepatobiliary standpoint.       Hypertension: Controlled-continue metoprolol, prazosin.   Dyslipidemia: Continue statin  BPH: Continue Flomax  OSA: CPAP nightly-but per RT note-patient has been refusing lately-we will counsel  Moderate protein calorie malnutrition: Continue supplements  Acute debility/deconditioning: Likely secondary to acute illness-initially plans were for SNF placement-however patient refusing SNF and wants to go home.  Will need maximum home health services on discharge    DVT Prophylaxis: SCD's  Code Status: Full code  Family Communication: Wife bedside on 08/29/2018, updated with the plan.  Disposition Plan: Remain inpatient-requires several more days of hospitalization before consideration of discharge  Antimicrobial agents: Anti-infectives (From admission, onward)   Start     Dose/Rate Route Frequency Ordered Stop   08/23/18 1500  Ampicillin-Sulbactam (UNASYN) 3 g in sodium chloride 0.9 % 100 mL IVPB     3 g 200 mL/hr over 30 Minutes Intravenous Every 6 hours 08/23/18 1434     08/23/18 0845  Ampicillin-Sulbactam (UNASYN) 3 g in sodium chloride 0.9 % 100 mL IVPB     3 g 200 mL/hr over 30 Minutes Intravenous To ShortStay [facility] 08/23/18 0839 08/23/18 1018   08/20/18 2200  ceFEPIme (MAXIPIME) 2 g in sodium chloride 0.9 % 100 mL IVPB  Status:  Discontinued     2 g 200 mL/hr over  30 Minutes Intravenous Every 12 hours 08/20/18 1315 08/20/18 1406   08/20/18 1500  Ampicillin-Sulbactam (UNASYN) 3 g in sodium chloride 0.9 % 100 mL IVPB  Status:  Discontinued     3 g 200 mL/hr over 30 Minutes Intravenous Every 6  hours 08/20/18 1420 08/23/18 0839   08/17/18 1400  metroNIDAZOLE (FLAGYL) tablet 500 mg  Status:  Discontinued     500 mg Oral Every 8 hours 08/17/18 1303 08/20/18 1406   08/16/18 2000  ceFEPIme (MAXIPIME) 2 g in sodium chloride 0.9 % 100 mL IVPB  Status:  Discontinued     2 g 200 mL/hr over 30 Minutes Intravenous Every 24 hours 08/16/18 1328 08/20/18 1315   08/15/18 1030  vancomycin (VANCOCIN) 1,250 mg in sodium chloride 0.9 % 250 mL IVPB     1,250 mg 166.7 mL/hr over 90 Minutes Intravenous  Once 08/15/18 0942 08/15/18 1444   08/14/18 2000  ceFEPIme (MAXIPIME) 1 g in sodium chloride 0.9 % 100 mL IVPB  Status:  Discontinued     1 g 200 mL/hr over 30 Minutes Intravenous Every 24 hours 08/13/18 2052 08/16/18 1328   08/13/18 2052  vancomycin variable dose per unstable renal function (pharmacist dosing)  Status:  Discontinued      Does not apply See admin instructions 08/13/18 2052 08/16/18 1442   08/13/18 2045  ceFEPIme (MAXIPIME) 2 g in sodium chloride 0.9 % 100 mL IVPB     2 g 200 mL/hr over 30 Minutes Intravenous  Once 08/13/18 2038 08/13/18 2141   08/13/18 2045  metroNIDAZOLE (FLAGYL) IVPB 500 mg  Status:  Discontinued     500 mg 100 mL/hr over 60 Minutes Intravenous Every 8 hours 08/13/18 2038 08/17/18 1303   08/13/18 2045  vancomycin (VANCOCIN) IVPB 1000 mg/200 mL premix     1,000 mg 200 mL/hr over 60 Minutes Intravenous  Once 08/13/18 2038 08/13/18 2333      Procedures: None  CONSULTS:  general surgery and urology  Time spent: 25 minutes-Greater than 50% of this time was spent in counseling, explanation of diagnosis, planning of further management, and coordination of care.  MEDICATIONS: Scheduled Meds: . atorvastatin  40 mg Oral Q supper  . cholecalciferol  2,000 Units Oral Daily  . feeding supplement  1 Container Oral BID BM  . feeding supplement (PRO-STAT SUGAR FREE 64)  30 mL Oral BID  . ferrous sulfate  325 mg Oral Q breakfast  . metoprolol tartrate  100 mg Oral BID   . multivitamin with minerals  1 tablet Oral Daily  . mupirocin ointment   Nasal BID  . pantoprazole  40 mg Oral BID AC  . polycarbophil  625 mg Oral BID  . prazosin  2 mg Oral QHS   Continuous Infusions: . 0.9 % NaCl with KCl 40 mEq / L    . ampicillin-sulbactam (UNASYN) IV 3 g (09/01/18 0828)   PRN Meds:.acetaminophen **OR** [DISCONTINUED] acetaminophen, clonazePAM, ondansetron (ZOFRAN) IV, oxyCODONE, simethicone, tamsulosin   PHYSICAL EXAM: Vital signs: Vitals:   08/31/18 1321 08/31/18 2109 08/31/18 2119 09/01/18 0527  BP: (!) 146/79 (!) 154/71  (!) 146/76  Pulse: 79  75 73  Resp: 18 18    Temp: 99.5 F (37.5 C)  99.1 F (37.3 C) 99.4 F (37.4 C)  TempSrc: Oral  Oral Oral  SpO2: 97%  100% 95%  Weight:      Height:       Filed Weights   08/13/18 1922 08/13/18 2234 08/26/18 1436  Weight:  86.2 kg 86.2 kg 86 kg   Body mass index is 25.71 kg/m.   Exam  Awake Alert, Oriented X 3, No new F.N deficits, Normal affect [place].AT,PERRAL Supple Neck,No JVD, No cervical lymphadenopathy appriciated.  Symmetrical Chest wall movement, Good air movement bilaterally, CTAB RRR,No Gallops, Rubs or new Murmurs, No Parasternal Heave +ve B.Sounds, Abd Soft, No tenderness, No organomegaly appriciated, No rebound - guarding or rigidity. No Cyanosis, Clubbing or edema, No new Rash or bruise Foley catheter in place, scrotal postop site under bandage.   I have personally reviewed following labs and imaging studies  LABORATORY DATA:  CBC:  Recent Labs  Lab 08/26/18 0301 08/26/18 1618 08/27/18 0430 08/28/18 0452 08/31/18 0417  WBC 9.8  --  11.7* 10.5 11.6*  HGB 7.8* 8.5* 8.7* 8.4* 7.9*  HCT 24.3* 25.0* 27.1* 27.5* 25.8*  MCV 90.3  --  92.5 91.7 91.8  PLT 499*  --  455* 428* 161    Basic Metabolic Panel:  Recent Labs  Lab 08/26/18 0301 08/26/18 1618 08/27/18 0430 08/28/18 0452 08/31/18 0417 09/01/18 0404  NA 136 139 138 140 136  --   K 3.9 3.9 4.8 3.9 3.5  --   CL 106   --  105 107 106  --   CO2 23  --  25 24 23   --   GLUCOSE 113* 93 135* 106* 112*  --   BUN 11  --  13 12 9   --   CREATININE 1.27*  --  1.29* 1.10 1.15  --   CALCIUM 8.1*  --  8.2* 8.2* 8.1*  --   MG  --   --   --   --  1.6* 1.9    GFR: Estimated Creatinine Clearance: 75.9 mL/min (by C-G formula based on SCr of 1.15 mg/dL).  Liver Function Tests: No results for input(s): AST, ALT, ALKPHOS, BILITOT, PROT, ALBUMIN in the last 168 hours. No results for input(s): LIPASE, AMYLASE in the last 168 hours. No results for input(s): AMMONIA in the last 168 hours.  Coagulation Profile: Recent Labs  Lab 08/31/18 0417  INR 1.30    Cardiac Enzymes: No results for input(s): CKTOTAL, CKMB, CKMBINDEX, TROPONINI in the last 168 hours.  BNP (last 3 results) No results for input(s): PROBNP in the last 8760 hours.  HbA1C: No results for input(s): HGBA1C in the last 72 hours.  CBG: No results for input(s): GLUCAP in the last 168 hours.  Lipid Profile: No results for input(s): CHOL, HDL, LDLCALC, TRIG, CHOLHDL, LDLDIRECT in the last 72 hours.  Thyroid Function Tests: No results for input(s): TSH, T4TOTAL, FREET4, T3FREE, THYROIDAB in the last 72 hours.  Anemia Panel: No results for input(s): VITAMINB12, FOLATE, FERRITIN, TIBC, IRON, RETICCTPCT in the last 72 hours.  Urine analysis:    Component Value Date/Time   COLORURINE AMBER (A) 08/13/2018 2010   APPEARANCEUR CLOUDY (A) 08/13/2018 2010   LABSPEC 1.014 08/13/2018 2010   PHURINE 5.0 08/13/2018 2010   GLUCOSEU NEGATIVE 08/13/2018 2010   HGBUR NEGATIVE 08/13/2018 2010   [place] NEGATIVE 08/13/2018 2010   [place] NEGATIVE 08/13/2018 2010   PROTEINUR 30 (A) 08/13/2018 2010   NITRITE NEGATIVE 08/13/2018 2010   LEUKOCYTESUR NEGATIVE 08/13/2018 2010    Sepsis Labs: Lactic Acid, Venous    Component Value Date/Time   LATICACIDVEN 1.95 (H) 08/13/2018 2313    MICROBIOLOGY: No results found for this or any previous visit (from  the past 240 hour(s)).  RADIOLOGY STUDIES/RESULTS: Ct Abdomen Pelvis Wo Contrast  Result Date: 08/13/2018 CLINICAL DATA:  Groin swelling EXAM: CT ABDOMEN AND PELVIS WITHOUT CONTRAST TECHNIQUE: Multidetector CT imaging of the abdomen and pelvis was performed following the standard protocol without IV contrast. COMPARISON:  MRI 07/10/2018 FINDINGS: Lower chest: Lung bases demonstrate no acute consolidation or effusion. The heart size is normal. Small hiatal hernia. Hepatobiliary: No focal liver abnormality is seen. No gallstones, gallbladder wall thickening, or biliary dilatation. Pancreas: Unremarkable. No pancreatic ductal dilatation or surrounding inflammatory changes. Spleen: Normal in size without focal abnormality. Adrenals/Urinary Tract: Adrenal glands are within normal limits. No hydronephrosis. Thick-walled urinary bladder Stomach/Bowel: Stomach is nonenlarged. No dilated small bowel. Extensive gas collection Between the rectum and prostate. Vascular/Lymphatic: Mild aortic atherosclerosis. No aneurysm. No significantly enlarged lymph nodes. Reproductive: Metallic densities along the posterior aspect of the prostate. Large gas collections around the prostate gland. Moderate edema of the scrotum and around the penis. Other: No free air. Fat in the left inguinal canal. Edema within the pubic soft tissues. Gas and fluid collection extending from the rectal area and along both sides of the prostate gland. Musculoskeletal: No acute or suspicious abnormality. IMPRESSION: 1. Moderate edema and mild inflammatory changes involving the scrotum and soft tissues about the penis, but without soft tissue emphysema. Mild edema within the pubic soft tissues. 2. Abnormal gas and fluid collection between the rectum and prostate gland and extending along the right and left aspects of the prostate gland, felt to correspond to previously demonstrated rectal ulcer and sinus tracks. 3. Thick-walled appearance of the urinary  bladder, possible cystitis Electronically Signed   By: Donavan Foil M.D.   On: 08/13/2018 23:19   Dg Chest 2 View  Result Date: 08/13/2018 CLINICAL DATA:  Acute onset of genital swelling. Fever. EXAM: CHEST - 2 VIEW COMPARISON:  Chest radiograph performed 07/07/2018 FINDINGS: The lungs are well-aerated. Minimal left basilar atelectasis is noted. There is no evidence of pleural effusion or pneumothorax. The heart is normal in size; the mediastinal contour is within normal limits. No acute osseous abnormalities are seen. IMPRESSION: Minimal left basilar atelectasis noted. Lungs otherwise clear. Electronically Signed   By: Garald Balding M.D.   On: 08/13/2018 22:16   Ct Pelvis Wo Contrast  Result Date: 08/17/2018 CLINICAL DATA:  Proctitis with micro perforation, now with abscess at penile shaft, worsening pelvic swelling EXAM: CT PELVIS WITHOUT CONTRAST TECHNIQUE: Multidetector CT imaging of the pelvis was performed following the standard protocol without intravenous contrast. COMPARISON:  CT abdomen/pelvis dated 09/09/2018. MR pelvis dated 07/10/2018. FINDINGS: Motion degraded images. Urinary Tract: Thick-walled bladder with indwelling Foley catheter and nondependent gas. Bowel: Visualized bowel is poorly evaluated but grossly unremarkable. Vascular/Lymphatic: Mild atherosclerotic calcifications of the bilateral iliac vessels. No suspicious pelvic lymphadenopathy. Reproductive: Gas within the bilateral seminal vesicles. Additional gas along the retro prostatic soft tissues (series 3/image 37) and extending along the right pelvic sidewall (series 3/image 31), unchanged. Fiducial radiation markers along the posterior prostate. One of these markers is now displaced posterior to the rectum (series 3/image 29), new from recent CT. Other: Subcutaneous stranding along the anterior lower pelvic wall, left greater than right (series 3/image 32), progressive. Additional fluid/stranding along the proximal penile shaft  (series 3/image 54) with diffuse scrotal edema (series 3/image 68). This appearance is compatible with cellulitis. No drainable fluid collection/abscess on CT. Musculoskeletal: Visualized osseous structures are within normal limits. IMPRESSION: Gas along the bilateral seminal vesicles and retro prostatic soft tissues, likely related to known rectal microperforation, unchanged. Associated cellulitis involving the lower  pelvic wall, penile shaft, and scrotum. No drainable fluid collection/abscess on CT. Displaced fiducial radiation marker now posterior to the rectum, new from recent CT. Electronically Signed   By: Julian Hy M.D.   On: 08/17/2018 11:34   US Renal  Result Date: 08/14/2018 CLINICAL DATA:  Acute renal failure. EXAM: RENAL / URINARY TRACT ULTRASOUND COMPLETE COMPARISON:  Noncontrast CT yesterday. FINDINGS: Right Kidney: Renal measurements: 12.6 x 5.2 x 6.1 cm = volume: 207 mL . Echogenicity within normal limits. No mass or hydronephrosis visualized. Left Kidney: Renal measurements: 12.8 x 5.1 x 5.7 cm = volume: 195 mL. Echogenicity within normal limits. No mass or hydronephrosis visualized. Bladder: Bladder wall thickening of 9 mm, as seen on CT. IMPRESSION: 1. Bladder wall thickening, similar to CT yesterday. 2. Unremarkable sonographic appearance of both kidneys. No hydronephrosis. Electronically Signed   By: Keith Rake M.D.   On: 08/14/2018 03:48   US Scrotum W/doppler  Result Date: 08/13/2018 CLINICAL DATA:  Initial evaluation for acute testicular pain, swelling. EXAM: SCROTAL ULTRASOUND DOPPLER ULTRASOUND OF THE TESTICLES TECHNIQUE: Complete ultrasound examination of the testicles, epididymis, and other scrotal structures was performed. Color and spectral Doppler ultrasound were also utilized to evaluate blood flow to the testicles. COMPARISON:  None. FINDINGS: Right testicle Measurements: 3.5 x 2.5 x 1.9 cm. No mass lesion. Testicular microlithiasis noted. Left testicle  Measurements: 3.9 x 2.2 x 2.3 cm. No mass lesion. Testicular microlithiasis noted. Right epididymis:  Normal in size and appearance. Left epididymis: Diffusely increased vascularity seen within the left epididymis, suggesting acute epididymitis. Hydrocele:  Bilateral hydroceles noted. Varicocele:  Bilateral varicoceles noted. Pulsed Doppler interrogation of both testes demonstrates normal low resistance arterial and venous waveforms bilaterally. Incidental note made of a probable small fat containing right inguinal hernia. IMPRESSION: 1. Increased vascularity within the left epididymis, suggesting acute epididymitis. No evidence for testicular torsion. 2. Small moderate bilateral hydroceles, likely reactive. 3. Small bilateral varicoceles. 4. Probable small fat containing right inguinal hernia. 5. Testicular microlithiasis. Current literature suggests that testicular microlithiasis is not a significant independent risk factor for development of testicular carcinoma, and that follow up imaging is not warranted in the absence of other risk factors. Monthly testicular self-examination and annual physical exams are considered appropriate surveillance. If patient has other risk factors for testicular carcinoma, then referral to Urology should be considered. (Reference: DeCastro, et al.: A 5-Year Follow up Study of Asymptomatic Men with Testicular Microlithiasis. J Urol 2008; 481:8563-1497.) Electronically Signed   By: Jeannine Boga M.D.   On: 08/13/2018 22:48   US Abdomen Limited Ruq  Result Date: 08/14/2018 CLINICAL DATA:  Initial evaluation for elevated AST with leukocytosis. EXAM: ULTRASOUND ABDOMEN LIMITED RIGHT UPPER QUADRANT COMPARISON:  None. FINDINGS: Gallbladder: No gallstones or wall thickening visualized. No sonographic Murphy sign noted by sonographer. Common bile duct: Diameter: 4.6 mm Liver: No focal lesion identified. Within normal limits in parenchymal echogenicity. Portal vein is patent on  color Doppler imaging with normal direction of blood flow towards the liver. IMPRESSION: Normal right upper quadrant ultrasound. No cholelithiasis, evidence for acute cholecystitis, or biliary dilatation. Electronically Signed   By: Jeannine Boga M.D.   On: 08/14/2018 03:43     LOS: 18 days    Signature  Lala Lund M.D on 09/01/2018 at 9:42 AM   -  To page go to [email or web address]

## 2018-09-01 NOTE — Progress Notes (Signed)
Pharmacy Antibiotic Note  Phillip Blackwell is a 58 y.o. male admitted on 08/13/2018 with sepsis. Patient with blood cultures growing rare organism: EGGERTHELLA LENTA. Plans to transition to PO Augmentin now at the discretion of Urology.   S/p Laparoscopic descending colostomy and Second Stage Penoscrotal Wound Debridementt. Afebrile, WBC wnl, clinically improved.  AKI  Improved, SCr 1.15.    ID recommended 3 weeks IV antibiotic then transition to oral augmentin.    Plan: Continue Unasyn 3g IV q6h Monitor clinical status, renal function daily.    Height: 6' (182.9 cm) Weight: 189 lb 9.5 oz (86 kg) IBW/kg (Calculated) : 77.6  Temp (24hrs), Avg:99.3 F (37.4 C), Min:99.1 F (37.3 C), Max:99.5 F (37.5 C)  Recent Labs  Lab 08/26/18 0301 08/27/18 0430 08/28/18 0452 08/31/18 0417  WBC 9.8 11.7* 10.5 11.6*  CREATININE 1.27* 1.29* 1.10 1.15    Estimated Creatinine Clearance: 75.9 mL/min (by C-G formula based on SCr of 1.15 mg/dL).    No Known Allergies  Antimicrobials this admission: Vanc 01/03 >> 1/6 Cefepime 01/03 >>1/10 Flagyl 1/3>>1/10 Unasyn 1/10>>  Microbiology Results: 1/3 BCx: EGGERTHELLA LENTA 1/3 UCx: NGF 1/8 Penoscrotal cx>>Mixed  1/5 MRSA PCR: negative  1/8 MRSA surgical PCR: SA positive; MRSA negative  Lisel Siegrist A. Levada Dy, PharmD, [place] Pager: [phone] Please utilize Amion for appropriate phone number to reach the unit pharmacist ([place])   09/01/2018   9:02 AM

## 2018-09-01 NOTE — Anesthesia Preprocedure Evaluation (Signed)
Anesthesia Evaluation  Patient identified by MRN, date of birth, ID band Patient awake    Reviewed: Allergy & Precautions, NPO status , Patient's Chart, lab work & pertinent test results  History of Anesthesia Complications Negative for: history of anesthetic complications  Airway Mallampati: III  TM Distance: >3 FB Neck ROM: Full    Dental  (+) Dental Advisory Given, Teeth Intact   Pulmonary sleep apnea (no longer uses CPAP) ,    breath sounds clear to auscultation       Cardiovascular hypertension, Pt. on medications and Pt. on home beta blockers  Rhythm:Regular Rate:Tachycardia     Neuro/Psych PSYCHIATRIC DISORDERS Anxiety Depression CVA, No Residual Symptoms    GI/Hepatic Neg liver ROS, GERD  Medicated,  Endo/Other  negative endocrine ROS  Renal/GU Renal InsufficiencyRenal disease   Chronic penile and scrotal wounds    Musculoskeletal negative musculoskeletal ROS (+)   Abdominal   Peds  Hematology  (+) anemia ,  Thrombocytosis    Anesthesia Other Findings   Reproductive/Obstetrics                             Anesthesia Physical  Anesthesia Plan  ASA: III  Anesthesia Plan: General   Post-op Pain Management:    Induction: Intravenous  PONV Risk Score and Plan: 4 or greater and Treatment may vary due to age or medical condition, Ondansetron, Scopolamine patch - Pre-op, Midazolam and Dexamethasone  Airway Management Planned: LMA  Additional Equipment: None  Intra-op Plan:   Post-operative Plan: Extubation in OR  Informed Consent: I have reviewed the patients History and Physical, chart, labs and discussed the procedure including the risks, benefits and alternatives for the proposed anesthesia with the patient or authorized representative who has indicated his/her understanding and acceptance.     Dental advisory given  Plan Discussed with: CRNA, Anesthesiologist and  Surgeon  Anesthesia Plan Comments:         Anesthesia Quick Evaluation

## 2018-09-01 NOTE — Transfer of Care (Signed)
Immediate Anesthesia Transfer of Care Note  Patient: Phillip Blackwell  Procedure(s) Performed: IRRIGATION AND DEBRIDEMENT ABSCESS (N/A )  Patient Location: PACU  Anesthesia Type:MAC  Level of Consciousness: drowsy and patient cooperative  Airway & Oxygen Therapy: Patient Spontanous Breathing and Patient connected to face mask oxygen  Post-op Assessment: Report given to RN and Post -op Vital signs reviewed and stable  Post vital signs: Reviewed and stable  Last Vitals:  Vitals Value Taken Time  BP 156/88 09/01/2018  4:49 PM  Temp    Pulse 90 09/01/2018  4:52 PM  Resp 11 09/01/2018  4:52 PM  SpO2 99 % 09/01/2018  4:52 PM  Vitals shown include unvalidated device data.  Last Pain:  Vitals:   09/01/18 1450  TempSrc: Oral  PainSc:       Patients Stated Pain Goal: 3 (32/95/18 8416)  Complications: No apparent anesthesia complications

## 2018-09-01 NOTE — Brief Op Note (Signed)
08/13/2018 - 09/01/2018  4:37 PM  PATIENT:  [name]  58 y.o. male  PRE-OPERATIVE DIAGNOSIS:  chronic penile and scrotal wounds  POST-OPERATIVE DIAGNOSIS:  chronic penile and scrotal wounds  PROCEDURE:  Procedure(s): IRRIGATION AND DEBRIDEMENT ABSCESS (N/A)  SURGEON:  Surgeon(s) and Role:    Alexis Frock, MD - Primary  PHYSICIAN ASSISTANT:   ASSISTANTS: none   ANESTHESIA:   general  EBL:  50 mL   BLOOD ADMINISTERED:none  DRAINS: kerlex wet to dry x 4   LOCAL MEDICATIONS USED:  NONE  SPECIMEN:  No Specimen  DISPOSITION OF SPECIMEN:  N/A  COUNTS:  YES  TOURNIQUET:  * No tourniquets in log *  DICTATION: .Other Dictation: Dictation Number [id number]  PLAN OF CARE: Admit to inpatient   PATIENT DISPOSITION:  PACU - hemodynamically stable.   Delay start of Pharmacological VTE agent (>24hrs) due to surgical blood loss or risk of bleeding: yes

## 2018-09-01 NOTE — Anesthesia Procedure Notes (Signed)
Procedure Name: LMA Insertion Date/Time: 09/01/2018 4:06 PM Performed by: Kathryne Hitch, CRNA Pre-anesthesia Checklist: Patient identified, Emergency Drugs available, Suction available, Timeout performed and Patient being monitored Patient Re-evaluated:Patient Re-evaluated prior to induction Oxygen Delivery Method: Circle system utilized Preoxygenation: Pre-oxygenation with 100% oxygen Induction Type: IV induction Ventilation: Mask ventilation without difficulty LMA: LMA inserted LMA Size: 5.0 Tube secured with: Tape Dental Injury: Teeth and Oropharynx as per pre-operative assessment

## 2018-09-01 NOTE — Progress Notes (Signed)
PT Cancellation Note  Patient Details Name: Phillip Blackwell MRN: [id number] DOB: [birth year]   Cancelled Treatment:    Reason Eval/Treat Not Completed: (P) Patient at procedure or test/unavailable(Pt off unit for surgery.  will continue efforts. )   [name] 09/01/2018, 4:29 PM

## 2018-09-01 NOTE — Anesthesia Postprocedure Evaluation (Signed)
Anesthesia Post Note  Patient: Phillip Blackwell  Procedure(s) Performed: IRRIGATION AND DEBRIDEMENT ABSCESS (N/A )     Patient location during evaluation: PACU Anesthesia Type: General Level of consciousness: awake and alert Pain management: pain level controlled Vital Signs Assessment: post-procedure vital signs reviewed and stable Respiratory status: spontaneous breathing, nonlabored ventilation, respiratory function stable and patient connected to nasal cannula oxygen Cardiovascular status: blood pressure returned to baseline and stable Postop Assessment: no apparent nausea or vomiting Anesthetic complications: no    Last Vitals:  Vitals:   09/01/18 1719 09/01/18 1725  BP: (!) 168/92   Pulse: 84   Resp: 13   Temp:  36.6 C  SpO2: 96%

## 2018-09-01 NOTE — Progress Notes (Signed)
Subjective/Chief Complaint:   1 - Prostate Cancer, Likely Recurrent - s/p primary external beam radiation completed 03/2017 in [place] for larger volume Gleason 6 disease. Initial PSA 5.5 and diagnosed 2017 by Dr. Alyson Ingles.  Recent PSA Course: 06/2017 - PSA 1.1 06/2018 - PSA 2.6   2 - Acute Renal Failure - Cr 7.2 / K 5.0 by ER labs 08/14/18 on eval sepsis. [place] and CT w/o hydro or distend bladder. Baseline Cr 1.1 as recently as 07/2018. Resolved back to baseline with hydration and conclusion of SIRS.   3 - Sepsis of GI/GU Origin with Prostatitis / Epididymitis / Colitis / Severe Penoscrotal Infection - lactate 3.8, WBC 42k by ET labs 08/14/18 on eval fevers and malaise. UA unremarkable, UCX negative. [place] 1/3 Egertella Scrotal US and Pelvic CT with inflmmation of epidydimis / prostate / peri-rectal gas c/w likely infection stemming from colon microperf seeding prostate and GU tract. Initially had good systemic response to IV ABX but worsened penoscrotal skin swelling and then some purlulent drainage 1/7, Repeat CT 1/7 with some progression of peri-rectal gas and new displacement of prior fiducial marker (confirming hole in rectum) and still no large fluid collections / sub-Q gas and exam with new draining sinus tracts at penoscrotal junction. 1st stage OR penoscrotal wound debridment 1/8, 3 large penrose drains place, approx 5cm2 non-viable skin removed. New Wound CX 1/8 - skin flora. +MRSA screen. Repeat debridement 1/13 and additional 4cm2 penile shaft tissue removed, drains replaced, end colostomy by Dr. Gershon Crane. Repeat debridement 1/16, no further tissue removed.   4 - Rectal Ulcer/Suspect Perforation - known DX rectal ulcer with periodic bleeding following prostate radiation 2018. Flex-Sig / BX 06/2018 of ulcer edges negative for malignancy. CT 08/14/18 with gas in peri-rectal / peri-prostatic space c/w likely microperforation. Lap end colostomy performed by gen surg 1/13.   Today " [place]" is  ready for 4th and hopefully final stage wound debridement. Still some low grade fevers but improving systemically.    Objective: Vital signs in last 24 hours: Temp:  [99.1 F (37.3 C)-99.5 F (37.5 C)] 99.4 F (37.4 C) (01/22 0527) Pulse Rate:  [73-79] 73 (01/22 0527) Resp:  [18] 18 (01/21 2109) BP: (146-154)/(71-79) 146/76 (01/22 0527) SpO2:  [95 %-100 %] 95 % (01/22 0527) Last BM Date: 08/31/18(reported that bag had been emptied today and more formed)  Intake/Output from previous day: 01/21 0701 - 01/22 0700 In: 849.9 [P.O.:240; I.V.:209.9; IV Piggyback:400] Out: 1680 [Urine:1525; Stool:155] Intake/Output this shift: No intake/output data recorded.  EXAM: NAD in pre-op holding, family at bedside. In much better spirits.  Head: Normocephalic.  Eyes: Pupils are equal, round, and reactive to light.  Neck: Normal range of motion.  Cardiovascular: regular rate.  Respiratory:  Non-labored on room air GI: Soft. There is no abdominal tenderness. There is no rebound and no guarding. LLQ with some green liquid and gas.  Genitourinary:    Genitourinary Comments: Recent surgical drians with drastically improved, now scant purlulence and local swelling of penis, scrotum, perineum. Musculoskeletal: Normal range of motion.  Neurological: He is alert.  Skin: Skin is warm.  Psychiatric: He has a normal mood and affect. His behavior is normal.  Lab Results:  Recent Labs    08/31/18 0417  WBC 11.6*  HGB 7.9*  HCT 25.8*  PLT 320   BMET Recent Labs    08/31/18 0417  NA 136  K 3.5  CL 106  CO2 23  GLUCOSE 112*  BUN 9  CREATININE  1.15  CALCIUM 8.1*   PT/INR Recent Labs    08/31/18 0417  LABPROT 16.0*  INR 1.30   ABG No results for input(s): PHART, HCO3 in the last 72 hours.  Invalid input(s): PCO2, PO2  Studies/Results: No results found.  Anti-infectives: Anti-infectives (From admission, onward)   Start     Dose/Rate Route Frequency Ordered Stop   08/23/18 1500   Ampicillin-Sulbactam (UNASYN) 3 g in sodium chloride 0.9 % 100 mL IVPB     3 g 200 mL/hr over 30 Minutes Intravenous Every 6 hours 08/23/18 1434     08/23/18 0845  Ampicillin-Sulbactam (UNASYN) 3 g in sodium chloride 0.9 % 100 mL IVPB     3 g 200 mL/hr over 30 Minutes Intravenous To ShortStay Surgical 08/23/18 0839 08/23/18 1018   08/20/18 2200  ceFEPIme (MAXIPIME) 2 g in sodium chloride 0.9 % 100 mL IVPB  Status:  Discontinued     2 g 200 mL/hr over 30 Minutes Intravenous Every 12 hours 08/20/18 1315 08/20/18 1406   08/20/18 1500  Ampicillin-Sulbactam (UNASYN) 3 g in sodium chloride 0.9 % 100 mL IVPB  Status:  Discontinued     3 g 200 mL/hr over 30 Minutes Intravenous Every 6 hours 08/20/18 1420 08/23/18 0839   08/17/18 1400  metroNIDAZOLE (FLAGYL) tablet 500 mg  Status:  Discontinued     500 mg Oral Every 8 hours 08/17/18 1303 08/20/18 1406   08/16/18 2000  ceFEPIme (MAXIPIME) 2 g in sodium chloride 0.9 % 100 mL IVPB  Status:  Discontinued     2 g 200 mL/hr over 30 Minutes Intravenous Every 24 hours 08/16/18 1328 08/20/18 1315   08/15/18 1030  vancomycin (VANCOCIN) 1,250 mg in sodium chloride 0.9 % 250 mL IVPB     1,250 mg 166.7 mL/hr over 90 Minutes Intravenous  Once 08/15/18 0942 08/15/18 1444   08/14/18 2000  ceFEPIme (MAXIPIME) 1 g in sodium chloride 0.9 % 100 mL IVPB  Status:  Discontinued     1 g 200 mL/hr over 30 Minutes Intravenous Every 24 hours 08/13/18 2052 08/16/18 1328   08/13/18 2052  vancomycin variable dose per unstable renal function (pharmacist dosing)  Status:  Discontinued      Does not apply See admin instructions 08/13/18 2052 08/16/18 1442   08/13/18 2045  ceFEPIme (MAXIPIME) 2 g in sodium chloride 0.9 % 100 mL IVPB     2 g 200 mL/hr over 30 Minutes Intravenous  Once 08/13/18 2038 08/13/18 2141   08/13/18 2045  metroNIDAZOLE (FLAGYL) IVPB 500 mg  Status:  Discontinued     500 mg 100 mL/hr over 60 Minutes Intravenous Every 8 hours 08/13/18 2038 08/17/18 1303    08/13/18 2045  vancomycin (VANCOCIN) IVPB 1000 mg/200 mL premix     1,000 mg 200 mL/hr over 60 Minutes Intravenous  Once 08/13/18 2038 08/13/18 2333      Assessment/Plan:  Proceed as planned with repeat wound debridement in OR today. Plan is to convert wound to wet to dry making transition to outpatient management feasible with home health dressing changes. Risks, benefits, expected peri-op course discussed.    Longer term, will likely consider plastics input pending amount of healing / granulation with wound care alone.   GREATLY appreciate hospitalist and general surgery team comanagement.    Alexis Frock 09/01/2018

## 2018-09-01 NOTE — Consult Note (Signed)
Branch Nurse ostomy follow up Stoma type/location: LLQ colostomy Stomal assessment/size: 1" by 1 5/8" oval, small granulomas at 3 o'clock. Peristomal assessment: intact Treatment options for stomal/peristomal skin: barrier ring Output thick pasty dark green/black stool Ostomy pouching: 1pc soft convex with skin barrier and belt. Education provided: Made visit earlier this am and patient still struggling with difference in emptying pouch and changing pouch. Decided to come back when wife arrives. Returned and met with pt and wife. Both timid. Demonstrated using pattern to hold to site to see if size has changed. It has not so allowed wife to trace and cut pouch, she did fairly well, needed a little guidance. Patient placed ring onto pouch and wife placed pouch in place, pt can not see from the bed. Discussed with them that when pt is up and walking he will want to place pouch as to facilitate emptying into toilet. Pt is more worried about [facility] paying for pouches. Explained he has samples at home and we would send home with several to get started. HHRN will assist in ordering and getting insurance worked out.Pt states he may go home tomorrow, which is Thurs but more than likely Friday. He is having debridement in the OR this afternoon by Dr. Tresa Moore. Reviewed emptying and using toilet paper wicks. Wife stated she had not been shown that, notes state otherwise. Both are nervous about going home. Encouraged them to write down any questions so that when the [facility] team returns Friday they can cover everything before discharge. 5 pouches and 5 rings left in top drawer of bedside table. WOC will follow Friday.  Enrolled patient in [place] Start Discharge program: Previously 1/14 Fara Olden, RN-C, [place], [place] [facility] Associate Ostomy Care Associate

## 2018-09-02 ENCOUNTER — Encounter (HOSPITAL_COMMUNITY): Payer: Self-pay | Admitting: Urology

## 2018-09-02 LAB — CBC
HCT: 28.5 % — ABNORMAL LOW (ref 39.0–52.0)
Hemoglobin: 9 g/dL — ABNORMAL LOW (ref 13.0–17.0)
MCH: 28.7 pg (ref 26.0–34.0)
MCHC: 31.6 g/dL (ref 30.0–36.0)
MCV: 90.8 fL (ref 80.0–100.0)
Platelets: 309 10*3/uL (ref 150–400)
RBC: 3.14 MIL/uL — ABNORMAL LOW (ref 4.22–5.81)
RDW: 15.1 % (ref 11.5–15.5)
WBC: 10 10*3/uL (ref 4.0–10.5)
nRBC: 0 % (ref 0.0–0.2)

## 2018-09-02 LAB — BASIC METABOLIC PANEL
Anion gap: 8 (ref 5–15)
BUN: 13 mg/dL (ref 6–20)
CO2: 21 mmol/L — ABNORMAL LOW (ref 22–32)
Calcium: 8.1 mg/dL — ABNORMAL LOW (ref 8.9–10.3)
Chloride: 108 mmol/L (ref 98–111)
Creatinine, Ser: 1.14 mg/dL (ref 0.61–1.24)
GFR calc Af Amer: >60 mL/min (ref >60)
GFR calc non Af Amer: >60 mL/min (ref >60)
Glucose, Bld: 153 mg/dL — ABNORMAL HIGH (ref 70–99)
Potassium: 4.2 mmol/L (ref 3.5–5.1)
Sodium: 137 mmol/L (ref 135–145)

## 2018-09-02 LAB — MAGNESIUM: Magnesium: 1.9 mg/dL (ref 1.7–2.4)

## 2018-09-02 MED ORDER — AMOXICILLIN-POT CLAVULANATE 875-125 MG PO TABS
1.0000 | ORAL_TABLET | Freq: Two times a day (BID) | ORAL | Status: DC
  Administered 2018-09-02 – 2018-09-03 (×2): 1 via ORAL
  Filled 2018-09-02 (×2): qty 1

## 2018-09-02 NOTE — Consult Note (Signed)
[place] Nurse wound consult note Patient receiving care in [facility] [phone].  Dressing change to penis, scrotum, perineum performed by wife after each step was initiated by [place] and WTA.  WTA, Fara Olden, assisted with dressing change and instruction of wife.  Patient received pain medication prior to initiation of dressing change, and he tolerated the dressing change VERY well and incorporated coaching to deep breathe and keep his body as relaxed as possible during the procedure. Reason for Consult: Penis, scrotum, perineum dressing change and spouse instruction on how to perform the saline moistened gauze dressing. A total of 4 pieces of roll gauze removed from the wounds (two from the mons pubis area, one each from the scrotum and perineum). Wound type: Surgical Measurement:  The depth of the mons pubis wound is 4.8 cm.  The depth of the scrotal wound is 4.8 cm.  The depth of the perineal wound is 2 cm.  All visible areas of the wound beds were 100% dark pink, without slough, without odor, erythema or induration. Periwound: Normal color and texture of skin Dressing procedure/placement/frequency: Daily saline moistened gauze into each wound cavity as well as over the open wound on the shaft of the penis.  One contiguous piece of saline moistened gauze was placed into each wound area sufficiently to fill the space.  Folded ABD pads were then placed over the packing and secured with mesh panties.  My plan is to return tomorrow around 10 a.m. and for the wife to do the complete dressing change process and for me to function only as stand by assistance.  The spouse today only needed to be shown how to start each step, and then she assumed the actual packing procedures for all three areas. Thank you for the consult.  Discussed plan of care with the patient and bedside nurse.   Val Riles, RN, MSN, CWOCN, CNS-BC, pager [phone]

## 2018-09-02 NOTE — Progress Notes (Signed)
1 Day Post-Op   Subjective/Chief Complaint:  1 - Prostate Cancer, Likely Recurrent - s/p primary external beam radiation completed 03/2017 in [place] for larger volume Gleason 6 disease. Initial PSA 5.5 and diagnosed 2017 by Dr. Alyson Ingles.  Recent PSA Course: 06/2017 - PSA 1.1 06/2018 - PSA 2.6   2 - Acute Renal Failure - Cr 7.2 / K 5.0 by ER labs 08/14/18 on eval sepsis. [place] and CT w/o hydro or distend bladder. Baseline Cr 1.1 as recently as 07/2018. Resolved back to baseline with hydration and conclusion of SIRS.   3 - Sepsis of GI/GU Origin with Prostatitis / Epididymitis / Colitis / Severe Penoscrotal Infection - lactate 3.8, WBC 42k by ET labs 08/14/18 on eval fevers and malaise. UA unremarkable, UCX negative. [place] 1/3 Eg[place] Scrotal US and Pelvic CT with inflmmation of epidydimis / prostate / peri-rectal gas c/w likely infection stemming from colon microperf seeding prostate and GU tract. Initially had good systemic response to IV ABX but worsened penoscrotal skin swelling and then some purlulent drainage 1/7, Repeat CT 1/7 with some progression of peri-rectal gas and new displacement of prior fiducial marker (confirming hole in rectum) and still no large fluid collections / sub-Q gas and exam with new draining sinus tracts at penoscrotal junction. 1st stage OR penoscrotal wound debridment 1/8, 3 large penrose drains place, approx 5cm2 non-viable skin removed. New Wound CX 1/8 - skin flora. +MRSA screen. Repeat debridement 1/13 and additional 4cm2 penile shaft tissue removed, drains replaced, end colostomy by Dr. Gershon Crane. Repeat debridement 1/16, no further tissue removed. 4th debridement in OR 1/22 and wound converted to wet to dry over 3 sites.   4 - Rectal Ulcer/Suspect Perforation - known DX rectal ulcer with periodic bleeding following prostate radiation 2018. Flex-Sig / BX 06/2018 of ulcer edges negative for malignancy. CT 08/14/18 with gas in peri-rectal / peri-prostatic space c/w likely  microperforation. Lap end colostomy performed by gen surg 1/13.   Today " [place]" continues to improve. Had dressing change today with help of wound RN, wife involved and motivated to learn ongoing wound care.    Objective: Vital signs in last 24 hours: Temp:  [98.2 F (36.8 C)-98.7 F (37.1 C)] 98.2 F (36.8 C) (01/23 1402) Pulse Rate:  [60-85] 79 (01/23 2119) Resp:  [18] 18 (01/23 2119) BP: (130-149)/(77-90) 146/77 (01/23 2119) SpO2:  [96 %-100 %] 99 % (01/23 2119) Last BM Date: 08/31/18  Intake/Output from previous day: 01/22 0701 - 01/23 0700 In: 800 [I.V.:600; IV Piggyback:200] Out: 1200 [Urine:1150; Blood:50] Intake/Output this shift: No intake/output data recorded.  EXAM: NAD in pre-op holding, family at bedside. In much better spirits.  Head: Normocephalic.  Eyes: Pupils are equal, round, and reactive to light.  Neck: Normal range of motion.  Cardiovascular: regular rate.  Respiratory:  Non-labored on room air GI: Soft. There is no abdominal tenderness. There is no rebound and no guarding. LLQ with some green liquid and gas.  Genitourinary:    Genitourinary Comments: Wet to dry dressing in place with scant serosanguinous staining that is non-foul at inferior abdominal wall site, depnedant scrotal site, and perineal site. Gransulation tissue seen throghout, significantly improved.  Musculoskeletal: Normal range of motion.  Neurological: He is alert.  Skin: Skin is warm.  Psychiatric: He has a normal mood and affect. His behavior is normal.  Lab Results:  Recent Labs    08/31/18 0417 09/02/18 0414  WBC 11.6* 10.0  HGB 7.9* 9.0*  HCT 25.8* 28.5*  PLT  320 309   BMET Recent Labs    08/31/18 0417 09/02/18 0414  NA 136 137  K 3.5 4.2  CL 106 108  CO2 23 21*  GLUCOSE 112* 153*  BUN 9 13  CREATININE 1.15 1.14  CALCIUM 8.1* 8.1*   PT/INR Recent Labs    08/31/18 0417  LABPROT 16.0*  INR 1.30   ABG No results for input(s): PHART, HCO3 in the last  72 hours.  Invalid input(s): PCO2, PO2  Studies/Results: No results found.  Anti-infectives: Anti-infectives (From admission, onward)   Start     Dose/Rate Route Frequency Ordered Stop   09/02/18 1000  amoxicillin-clavulanate (AUGMENTIN) 875-125 MG per tablet 1 tablet     1 tablet Oral Every 12 hours 09/02/18 0945     08/23/18 1500  Ampicillin-Sulbactam (UNASYN) 3 g in sodium chloride 0.9 % 100 mL IVPB  Status:  Discontinued     3 g 200 mL/hr over 30 Minutes Intravenous Every 6 hours 08/23/18 1434 09/02/18 0945   08/23/18 0845  Ampicillin-Sulbactam (UNASYN) 3 g in sodium chloride 0.9 % 100 mL IVPB     3 g 200 mL/hr over 30 Minutes Intravenous To ShortStay Surgical 08/23/18 0839 08/23/18 1018   08/20/18 2200  ceFEPIme (MAXIPIME) 2 g in sodium chloride 0.9 % 100 mL IVPB  Status:  Discontinued     2 g 200 mL/hr over 30 Minutes Intravenous Every 12 hours 08/20/18 1315 08/20/18 1406   08/20/18 1500  Ampicillin-Sulbactam (UNASYN) 3 g in sodium chloride 0.9 % 100 mL IVPB  Status:  Discontinued     3 g 200 mL/hr over 30 Minutes Intravenous Every 6 hours 08/20/18 1420 08/23/18 0839   08/17/18 1400  metroNIDAZOLE (FLAGYL) tablet 500 mg  Status:  Discontinued     500 mg Oral Every 8 hours 08/17/18 1303 08/20/18 1406   08/16/18 2000  ceFEPIme (MAXIPIME) 2 g in sodium chloride 0.9 % 100 mL IVPB  Status:  Discontinued     2 g 200 mL/hr over 30 Minutes Intravenous Every 24 hours 08/16/18 1328 08/20/18 1315   08/15/18 1030  vancomycin (VANCOCIN) 1,250 mg in sodium chloride 0.9 % 250 mL IVPB     1,250 mg 166.7 mL/hr over 90 Minutes Intravenous  Once 08/15/18 0942 08/15/18 1444   08/14/18 2000  ceFEPIme (MAXIPIME) 1 g in sodium chloride 0.9 % 100 mL IVPB  Status:  Discontinued     1 g 200 mL/hr over 30 Minutes Intravenous Every 24 hours 08/13/18 2052 08/16/18 1328   08/13/18 2052  vancomycin variable dose per unstable renal function (pharmacist dosing)  Status:  Discontinued      Does not apply See  admin instructions 08/13/18 2052 08/16/18 1442   08/13/18 2045  ceFEPIme (MAXIPIME) 2 g in sodium chloride 0.9 % 100 mL IVPB     2 g 200 mL/hr over 30 Minutes Intravenous  Once 08/13/18 2038 08/13/18 2141   08/13/18 2045  metroNIDAZOLE (FLAGYL) IVPB 500 mg  Status:  Discontinued     500 mg 100 mL/hr over 60 Minutes Intravenous Every 8 hours 08/13/18 2038 08/17/18 1303   08/13/18 2045  vancomycin (VANCOCIN) IVPB 1000 mg/200 mL premix     1,000 mg 200 mL/hr over 60 Minutes Intravenous  Once 08/13/18 2038 08/13/18 2333      Assessment/Plan:  Pt has cleared systemic infectious parameters and wound now amenable to outpatient care. Agree with plan for DC home with [facility], The dressing changes and bridge to PO ABX.  Alexis Frock 09/02/2018

## 2018-09-02 NOTE — Op Note (Signed)
NAME: Phillip Blackwell, Phillip Blackwell MEDICAL RECORD PO:[id number] ACCOUNT [id number] DATE OF BIRTH:08-Jul-1961 FACILITY: [facility] LOCATION: [id number] PHYSICIAN:Artavius Stearns, MD  OPERATIVE REPORT  DATE OF PROCEDURE:  09/01/2018  PREOPERATIVE DIAGNOSIS:  Complex penoscrotal wound, status post radiation with colon perforation.  PROCEDURE:  Fourth stage penoscrotal wound debridement.  ESTIMATED BLOOD LOSS:  30 mL.  COMPLICATIONS:  None.  SPECIMENS:  None.  FINDINGS: 1.  Continued improvement of complex penoscrotal wound, minimal continued purulence. 2.  Conversion of wound to Kerlix wet-to-dry management as per plan. 3.  Four strips of Kerlix used to the superior penoscrotal junction, one scrotal, one perineal.  INDICATIONS:  The patient is a very pleasant, but somewhat unfortunate 58 year old gentleman with history of prostate cancer status post radiation at another facility.  He unfortunately developed a rectal ulceration during perforation, which was then  seated his pelvic skin and GI and GU organs causing a septic episode.  He has undergone staged debridement x3 previously as well as an end-colostomy to divert fecal stream away from his perforation.  He has been improving systemically and his  wound  and he presents today for a fourth and hopeful final debridement.  Informed consent was then placed in medical record.  DESCRIPTION OF PROCEDURE:  The patient was identified.  The procedure being fourth stage penoscrotal wound debridement was confirmed.  Procedure timeout was performed.  Intravenous access administered.  General LMA anesthesia induced.  The patient was  placed into a low lithotomy position, sterile field was created prepped and draped base of the penis, perineum and proximal thighs using iodine.  His in situ Penrose drains were removed x3, inspected and intact and discarded.  Next, using the Pulsavac  device, all aspects of the wound were very carefully pulse lavaged and debrided down to  what appeared to be viable vascular tissue circumferentially.  The only area of continued purulence which was indeed minimal was at the inferior abdominal wall,  severe penoscrotal junction area.  Otherwise, all aspects of the wound including inferior penoscrotal junction, his scrotum area and perineum were significantly improved.  There was essentially complete resolution of all nonviable tissue at prior  procedures.  Two strips of Kerlix approximately 18 inches in length were carefully placed at the superior penoscrotal junction, one tracking to the right and one tracking to the left, another approximately 12 inch piece of a Kerlix at the level of the  scrotum, and another approximately 6 inch piece at the level of the perineal incision.  A dressing of ABD pads and mesh underwear was placed.  The procedure was terminated.  A catheter was not replaced.  The patient tolerated the procedure well.  No  immediate perioperative complications.  The patient was taken to postanesthesia care in stable condition with plan for continued wound management now with bedside dressing changes per wound ostomy team and likely discharge with home health for continued  dressing changes.  TN/NUANCE  D:09/01/2018 T:09/01/2018 JOB:[id number]

## 2018-09-02 NOTE — Progress Notes (Signed)
PROGRESS NOTE        PATIENT DETAILS Name: Phillip Blackwell Age: 58 y.o. Sex: male Date of Birth: July 23, 1961 Admit Date: 08/13/2018 Admitting Physician Shela Leff, MD [id number], Pcp Not In  Brief Narrative: Patient is a 58 y.o. male with history of hypertension, BPH, OSA prostate cancer status post external beam radiation-presented with sepsis secondary to rectal ulcer with perforation with seeding of the GU tract causing epididymitis/prostatitis.  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 Further hospital course complicated by worsening penile/scrotal infection and AKI.  Evaluated by general surgery and urology-underwent diverting colostomy, and has had multiple trips to the OR for scrotal debridement.  See below for further details   Subjective: Patient in bed, appears comfortable, denies any headache, no fever, no chest pain or pressure, no shortness of breath , no abdominal pain. No focal weakness.   Assessment/Plan:  Severe sepsis secondary to perforated rectal ulcer with prostatitis/epididymitis and penoscrotal abscess along with eggerthella lenta bacteremia: Sepsis pathophysiology has resolved, he is also undergone diverting colostomy which appears stable, seen by urology underwent scrotal debridement on 08/23/2018 along with 08/27/2018, I discussed his case with urologist Dr. Tammi Klippel on 08/31/2018, taken to the OR one more time on 09/01/2018, drains removed Foley removed, discussed with urologist Dr. Tammi Klippel on 09/02/2018.  Continue daily dressing changes switch to oral Augmentin for another 2 weeks and discharged home.  Patient wishes to go home plan was discussed with wife, he will get home RN for dressing change and wife is to learn dressing changes today as well.  AKI: This was hemodynamically mediated ATN which has resolved after supportive care and hydration.   Multifactorial anemia secondary to acute blood loss anemia and secondary to  acute/critical illness: No further hematochezia-hemoglobin levels are relatively stable-no further hematochezia, suspicion for acute illness causing him to have persistent anemia rather than further GI bleeding.  Has been transfused 1 unit of PRBC so far, follow CBC periodically.   Hyperbilirubinemia: Due to sepsis resolved, CT scan unremarkable from hepatobiliary standpoint.       Hypertension: Controlled-continue metoprolol, prazosin.   Dyslipidemia: Continue statin  BPH: Continue Flomax  OSA: CPAP nightly-but per RT note-patient has been refusing lately-we will counsel  Moderate protein calorie malnutrition: Continue supplements  Acute debility/deconditioning: Likely secondary to acute illness-initially plans were for SNF placement-however patient refusing SNF and wants to go home.  Will need maximum home health services on discharge    DVT Prophylaxis: SCD's  Code Status: Full code  Family Communication: Wife bedside on 08/29/2018, updated with the plan.  Disposition Plan: Remain inpatient-requires several more days of hospitalization before consideration of discharge  Antimicrobial agents: Anti-infectives (From admission, onward)   Start     Dose/Rate Route Frequency Ordered Stop   08/23/18 1500  Ampicillin-Sulbactam (UNASYN) 3 g in sodium chloride 0.9 % 100 mL IVPB     3 g 200 mL/hr over 30 Minutes Intravenous Every 6 hours 08/23/18 1434     08/23/18 0845  Ampicillin-Sulbactam (UNASYN) 3 g in sodium chloride 0.9 % 100 mL IVPB     3 g 200 mL/hr over 30 Minutes Intravenous To [facility] 08/23/18 0839 08/23/18 1018   08/20/18 2200  ceFEPIme (MAXIPIME) 2 g in sodium chloride 0.9 % 100 mL IVPB  Status:  Discontinued     2 g 200 mL/hr over 30 Minutes  Intravenous Every 12 hours 08/20/18 1315 08/20/18 1406   08/20/18 1500  Ampicillin-Sulbactam (UNASYN) 3 g in sodium chloride 0.9 % 100 mL IVPB  Status:  Discontinued     3 g 200 mL/hr over 30 Minutes Intravenous Every 6  hours 08/20/18 1420 08/23/18 0839   08/17/18 1400  metroNIDAZOLE (FLAGYL) tablet 500 mg  Status:  Discontinued     500 mg Oral Every 8 hours 08/17/18 1303 08/20/18 1406   08/16/18 2000  ceFEPIme (MAXIPIME) 2 g in sodium chloride 0.9 % 100 mL IVPB  Status:  Discontinued     2 g 200 mL/hr over 30 Minutes Intravenous Every 24 hours 08/16/18 1328 08/20/18 1315   08/15/18 1030  vancomycin (VANCOCIN) 1,250 mg in sodium chloride 0.9 % 250 mL IVPB     1,250 mg 166.7 mL/hr over 90 Minutes Intravenous  Once 08/15/18 0942 08/15/18 1444   08/14/18 2000  ceFEPIme (MAXIPIME) 1 g in sodium chloride 0.9 % 100 mL IVPB  Status:  Discontinued     1 g 200 mL/hr over 30 Minutes Intravenous Every 24 hours 08/13/18 2052 08/16/18 1328   08/13/18 2052  vancomycin variable dose per unstable renal function (pharmacist dosing)  Status:  Discontinued      Does not apply See admin instructions 08/13/18 2052 08/16/18 1442   08/13/18 2045  ceFEPIme (MAXIPIME) 2 g in sodium chloride 0.9 % 100 mL IVPB     2 g 200 mL/hr over 30 Minutes Intravenous  Once 08/13/18 2038 08/13/18 2141   08/13/18 2045  metroNIDAZOLE (FLAGYL) IVPB 500 mg  Status:  Discontinued     500 mg 100 mL/hr over 60 Minutes Intravenous Every 8 hours 08/13/18 2038 08/17/18 1303   08/13/18 2045  vancomycin (VANCOCIN) IVPB 1000 mg/200 mL premix     1,000 mg 200 mL/hr over 60 Minutes Intravenous  Once 08/13/18 2038 08/13/18 2333      Procedures: None  CONSULTS:  general surgery and urology  Time spent: 25 minutes-Greater than 50% of this time was spent in counseling, explanation of diagnosis, planning of further management, and coordination of care.  MEDICATIONS: Scheduled Meds: . atorvastatin  40 mg Oral Q supper  . cholecalciferol  2,000 Units Oral Daily  . feeding supplement  1 Container Oral BID BM  . feeding supplement (PRO-STAT SUGAR FREE 64)  30 mL Oral BID  . ferrous sulfate  325 mg Oral Q breakfast  . metoprolol tartrate  100 mg Oral BID   . multivitamin with minerals  1 tablet Oral Daily  . mupirocin ointment   Nasal BID  . pantoprazole  40 mg Oral BID AC  . polycarbophil  625 mg Oral BID  . prazosin  2 mg Oral QHS   Continuous Infusions: . ampicillin-sulbactam (UNASYN) IV 3 g (09/02/18 0326)  . lactated ringers 10 mL/hr at 09/01/18 1533   PRN Meds:.acetaminophen **OR** [DISCONTINUED] acetaminophen, clonazePAM, ondansetron (ZOFRAN) IV, oxyCODONE, simethicone, tamsulosin   PHYSICAL EXAM: Vital signs: Vitals:   09/01/18 1719 09/01/18 1725 09/01/18 2142 09/02/18 0430  BP: (!) 168/92  (!) 153/88 130/79  Pulse: 84  90 60  Resp: 13  18 18   Temp:  97.8 F (36.6 C) 97.8 F (36.6 C) 98.7 F (37.1 C)  TempSrc:   Oral Oral  SpO2: 96%  95% 96%  Weight:      Height:       Filed Weights   08/13/18 2234 08/26/18 1436 09/01/18 1521  Weight: 86.2 kg 86 kg 86 kg  Body mass index is 25.71 kg/m.   Exam  Awake Alert, Oriented X 3, No new F.N deficits, Normal affect [place].AT,PERRAL Supple Neck,No JVD, No cervical lymphadenopathy appriciated.  Symmetrical Chest wall movement, Good air movement bilaterally, CTAB RRR,No Gallops, Rubs or new Murmurs, No Parasternal Heave +ve B.Sounds, Abd Soft, No tenderness, No organomegaly appriciated, No rebound - guarding or rigidity. No Cyanosis, Clubbing or edema, No new Rash or bruise Scrotal postop site under bandage.   I have personally reviewed following labs and imaging studies  LABORATORY DATA:  CBC:  Recent Labs  Lab 08/26/18 1618 08/27/18 0430 08/28/18 0452 08/31/18 0417 09/02/18 0414  WBC  --  11.7* 10.5 11.6* 10.0  HGB 8.5* 8.7* 8.4* 7.9* 9.0*  HCT 25.0* 27.1* 27.5* 25.8* 28.5*  MCV  --  92.5 91.7 91.8 90.8  PLT  --  455* 428* 320 696    Basic Metabolic Panel:  Recent Labs  Lab 08/26/18 1618 08/27/18 0430 08/28/18 0452 08/31/18 0417 09/01/18 0404 09/02/18 0414  NA 139 138 140 136  --  137  K 3.9 4.8 3.9 3.5  --  4.2  CL  --  105 107 106  --  108    CO2  --  25 24 23   --  21*  GLUCOSE 93 135* 106* 112*  --  153*  BUN  --  13 12 9   --  13  CREATININE  --  1.29* 1.10 1.15  --  1.14  CALCIUM  --  8.2* 8.2* 8.1*  --  8.1*  MG  --   --   --  1.6* 1.9 1.9    GFR: Estimated Creatinine Clearance: 76.6 mL/min (by C-G formula based on SCr of 1.14 mg/dL).  Liver Function Tests: No results for input(s): AST, ALT, ALKPHOS, BILITOT, PROT, ALBUMIN in the last 168 hours. No results for input(s): LIPASE, AMYLASE in the last 168 hours. No results for input(s): AMMONIA in the last 168 hours.  Coagulation Profile: Recent Labs  Lab 08/31/18 0417  INR 1.30    Cardiac Enzymes: No results for input(s): CKTOTAL, CKMB, CKMBINDEX, TROPONINI in the last 168 hours.  BNP (last 3 results) No results for input(s): PROBNP in the last 8760 hours.  HbA1C: No results for input(s): HGBA1C in the last 72 hours.  CBG: No results for input(s): GLUCAP in the last 168 hours.  Lipid Profile: No results for input(s): CHOL, HDL, LDLCALC, TRIG, CHOLHDL, LDLDIRECT in the last 72 hours.  Thyroid Function Tests: No results for input(s): TSH, T4TOTAL, FREET4, T3FREE, THYROIDAB in the last 72 hours.  Anemia Panel: No results for input(s): VITAMINB12, FOLATE, FERRITIN, TIBC, IRON, RETICCTPCT in the last 72 hours.  Urine analysis:    Component Value Date/Time   COLORURINE AMBER (A) 08/13/2018 2010   APPEARANCEUR CLOUDY (A) 08/13/2018 2010   LABSPEC 1.014 08/13/2018 2010   PHURINE 5.0 08/13/2018 2010   GLUCOSEU NEGATIVE 08/13/2018 2010   HGBUR NEGATIVE 08/13/2018 2010   [place] NEGATIVE 08/13/2018 2010   [place] NEGATIVE 08/13/2018 2010   PROTEINUR 30 (A) 08/13/2018 2010   NITRITE NEGATIVE 08/13/2018 2010   LEUKOCYTESUR NEGATIVE 08/13/2018 2010    Sepsis Labs: Lactic Acid, Venous    Component Value Date/Time   LATICACIDVEN 1.95 (H) 08/13/2018 2313    MICROBIOLOGY: No results found for this or any previous visit (from the past 240  hour(s)).  RADIOLOGY STUDIES/RESULTS: Ct Abdomen Pelvis Wo Contrast  Result Date: 08/13/2018 CLINICAL DATA:  Groin swelling EXAM: CT ABDOMEN AND PELVIS  WITHOUT CONTRAST TECHNIQUE: Multidetector CT imaging of the abdomen and pelvis was performed following the standard protocol without IV contrast. COMPARISON:  MRI 07/10/2018 FINDINGS: Lower chest: Lung bases demonstrate no acute consolidation or effusion. The heart size is normal. Small hiatal hernia. Hepatobiliary: No focal liver abnormality is seen. No gallstones, gallbladder wall thickening, or biliary dilatation. Pancreas: Unremarkable. No pancreatic ductal dilatation or surrounding inflammatory changes. Spleen: Normal in size without focal abnormality. Adrenals/Urinary Tract: Adrenal glands are within normal limits. No hydronephrosis. Thick-walled urinary bladder Stomach/Bowel: Stomach is nonenlarged. No dilated small bowel. Extensive gas collection Between the rectum and prostate. Vascular/Lymphatic: Mild aortic atherosclerosis. No aneurysm. No significantly enlarged lymph nodes. Reproductive: Metallic densities along the posterior aspect of the prostate. Large gas collections around the prostate gland. Moderate edema of the scrotum and around the penis. Other: No free air. Fat in the left inguinal canal. Edema within the pubic soft tissues. Gas and fluid collection extending from the rectal area and along both sides of the prostate gland. Musculoskeletal: No acute or suspicious abnormality. IMPRESSION: 1. Moderate edema and mild inflammatory changes involving the scrotum and soft tissues about the penis, but without soft tissue emphysema. Mild edema within the pubic soft tissues. 2. Abnormal gas and fluid collection between the rectum and prostate gland and extending along the right and left aspects of the prostate gland, felt to correspond to previously demonstrated rectal ulcer and sinus tracks. 3. Thick-walled appearance of the urinary bladder, possible  cystitis Electronically Signed   By: Donavan Foil M.D.   On: 08/13/2018 23:19   Dg Chest 2 View  Result Date: 08/13/2018 CLINICAL DATA:  Acute onset of genital swelling. Fever. EXAM: CHEST - 2 VIEW COMPARISON:  Chest radiograph performed 07/07/2018 FINDINGS: The lungs are well-aerated. Minimal left basilar atelectasis is noted. There is no evidence of pleural effusion or pneumothorax. The heart is normal in size; the mediastinal contour is within normal limits. No acute osseous abnormalities are seen. IMPRESSION: Minimal left basilar atelectasis noted. Lungs otherwise clear. Electronically Signed   By: Garald Balding M.D.   On: 08/13/2018 22:16   Ct Pelvis Wo Contrast  Result Date: 08/17/2018 CLINICAL DATA:  Proctitis with micro perforation, now with abscess at penile shaft, worsening pelvic swelling EXAM: CT PELVIS WITHOUT CONTRAST TECHNIQUE: Multidetector CT imaging of the pelvis was performed following the standard protocol without intravenous contrast. COMPARISON:  CT abdomen/pelvis dated 09/09/2018. MR pelvis dated 07/10/2018. FINDINGS: Motion degraded images. Urinary Tract: Thick-walled bladder with indwelling Foley catheter and nondependent gas. Bowel: Visualized bowel is poorly evaluated but grossly unremarkable. Vascular/Lymphatic: Mild atherosclerotic calcifications of the bilateral iliac vessels. No suspicious pelvic lymphadenopathy. Reproductive: Gas within the bilateral seminal vesicles. Additional gas along the retro prostatic soft tissues (series 3/image 37) and extending along the right pelvic sidewall (series 3/image 31), unchanged. Fiducial radiation markers along the posterior prostate. One of these markers is now displaced posterior to the rectum (series 3/image 29), new from recent CT. Other: Subcutaneous stranding along the anterior lower pelvic wall, left greater than right (series 3/image 32), progressive. Additional fluid/stranding along the proximal penile shaft (series 3/image 54)  with diffuse scrotal edema (series 3/image 68). This appearance is compatible with cellulitis. No drainable fluid collection/abscess on CT. Musculoskeletal: Visualized osseous structures are within normal limits. IMPRESSION: Gas along the bilateral seminal vesicles and retro prostatic soft tissues, likely related to known rectal microperforation, unchanged. Associated cellulitis involving the lower pelvic wall, penile shaft, and scrotum. No drainable fluid collection/abscess on CT. Displaced  fiducial radiation marker now posterior to the rectum, new from recent CT. Electronically Signed   By: Julian Hy M.D.   On: 08/17/2018 11:34   US Renal  Result Date: 08/14/2018 CLINICAL DATA:  Acute renal failure. EXAM: RENAL / URINARY TRACT ULTRASOUND COMPLETE COMPARISON:  Noncontrast CT yesterday. FINDINGS: Right Kidney: Renal measurements: 12.6 x 5.2 x 6.1 cm = volume: 207 mL . Echogenicity within normal limits. No mass or hydronephrosis visualized. Left Kidney: Renal measurements: 12.8 x 5.1 x 5.7 cm = volume: 195 mL. Echogenicity within normal limits. No mass or hydronephrosis visualized. Bladder: Bladder wall thickening of 9 mm, as seen on CT. IMPRESSION: 1. Bladder wall thickening, similar to CT yesterday. 2. Unremarkable sonographic appearance of both kidneys. No hydronephrosis. Electronically Signed   By: Keith Rake M.D.   On: 08/14/2018 03:48   US Scrotum W/doppler  Result Date: 08/13/2018 CLINICAL DATA:  Initial evaluation for acute testicular pain, swelling. EXAM: SCROTAL ULTRASOUND DOPPLER ULTRASOUND OF THE TESTICLES TECHNIQUE: Complete ultrasound examination of the testicles, epididymis, and other scrotal structures was performed. Color and spectral Doppler ultrasound were also utilized to evaluate blood flow to the testicles. COMPARISON:  None. FINDINGS: Right testicle Measurements: 3.5 x 2.5 x 1.9 cm. No mass lesion. Testicular microlithiasis noted. Left testicle Measurements: 3.9 x 2.2 x 2.3  cm. No mass lesion. Testicular microlithiasis noted. Right epididymis:  Normal in size and appearance. Left epididymis: Diffusely increased vascularity seen within the left epididymis, suggesting acute epididymitis. Hydrocele:  Bilateral hydroceles noted. Varicocele:  Bilateral varicoceles noted. Pulsed Doppler interrogation of both testes demonstrates normal low resistance arterial and venous waveforms bilaterally. Incidental note made of a probable small fat containing right inguinal hernia. IMPRESSION: 1. Increased vascularity within the left epididymis, suggesting acute epididymitis. No evidence for testicular torsion. 2. Small moderate bilateral hydroceles, likely reactive. 3. Small bilateral varicoceles. 4. Probable small fat containing right inguinal hernia. 5. Testicular microlithiasis. Current literature suggests that testicular microlithiasis is not a significant independent risk factor for development of testicular carcinoma, and that follow up imaging is not warranted in the absence of other risk factors. Monthly testicular self-examination and annual physical exams are considered appropriate surveillance. If patient has other risk factors for testicular carcinoma, then referral to Urology should be considered. (Reference: DeCastro, et al.: A 5-Year Follow up Study of Asymptomatic Men with Testicular Microlithiasis. J Urol 2008; 161:0960-4540.) Electronically Signed   By: Jeannine Boga M.D.   On: 08/13/2018 22:48   US Abdomen Limited Ruq  Result Date: 08/14/2018 CLINICAL DATA:  Initial evaluation for elevated AST with leukocytosis. EXAM: ULTRASOUND ABDOMEN LIMITED RIGHT UPPER QUADRANT COMPARISON:  None. FINDINGS: Gallbladder: No gallstones or wall thickening visualized. No sonographic Murphy sign noted by sonographer. Common bile duct: Diameter: 4.6 mm Liver: No focal lesion identified. Within normal limits in parenchymal echogenicity. Portal vein is patent on color Doppler imaging with normal  direction of blood flow towards the liver. IMPRESSION: Normal right upper quadrant ultrasound. No cholelithiasis, evidence for acute cholecystitis, or biliary dilatation. Electronically Signed   By: Jeannine Boga M.D.   On: 08/14/2018 03:43     LOS: 19 days    Signature  Lala Lund M.D on 09/02/2018 at 9:43 AM   -  To page go to [email or web address]

## 2018-09-02 NOTE — Progress Notes (Signed)
follow up on discontinued foley catheter. Patient had good urine output.

## 2018-09-02 NOTE — Progress Notes (Signed)
PT Cancellation Note  Patient Details Name: Phillip Blackwell MRN: [id number] DOB: November 17, 1960   Cancelled Treatment:    Reason Eval/Treat Not Completed: Patient at procedure or test/unavailable(Nursign in room, preparring to do a dressing change. Will try treatment again at later date/time as schedule permits. )  10:04 AM, 09/02/18 Etta Grandchild, PT, DPT Physical Therapist - [place] [phone] (Pager)  [phone] (Office)      [name] C 09/02/2018, 10:04 AM

## 2018-09-02 NOTE — Plan of Care (Signed)
Patient is making progress toward discharge, patient is able to ambulate to bathroom without SOB, incision wound clean with moderate drainage. Dressings reinforced x 2 on this shift. Pain managed by prn pain medication. No sign and symptoms of infection noted.

## 2018-09-03 MED ORDER — "DRESSING SPONGES 4""X4"" PADS": MEDICATED_PAD | 1 refills | Status: DC

## 2018-09-03 MED ORDER — AMOXICILLIN-POT CLAVULANATE 875-125 MG PO TABS: 1.0000 | ORAL_TABLET | Freq: Two times a day (BID) | ORAL | 0 refills | Status: DC

## 2018-09-03 MED FILL — AMOX-CLAV 875-125 MG TABLET: 875-125 | 10 days supply | Qty: 20 | Fill #0

## 2018-09-03 NOTE — Care Management Note (Addendum)
Case Management Note  Patient Details  Name: Phillip Blackwell MRN: [id number] Date of Birth: Aug 16, 1960  Subjective/Objective:       Admitted with severe sepsis/ rectal ulcer/colon microperforation, seeding prostate and GU tract, causing epididymitis/prostatitis, and colitis. Hx of anxiety, depression, GERD, hypertension, CVA, OSA. From home with wife.             Pranshu Lyster (Spouse)     [phone]       PCP: [facility], Dr. Raynelle Dick Tiva ( fax# [phone]), [place] pager , [phone], office # [phone], [phone]    Action/Plan: Transition to home today with home health services to follow. [facility] to provide home services for pt, authorization for care pending from the [place] / Home and [facility] [phone].  Pt to f/u ; [place]    [phone] [place] [phone]    Next Steps: Follow up on 09/07/2018    Instructions: 9am, Dr.Robso       Wife to provide transportation to home.   Expected Discharge Date:  09/03/18               Expected Discharge Plan:  [place]  In-House Referral:  Clinical Social Work  Discharge planning Services  CM Consult  Post Acute Care Choice:    Choice offered to:  Patient  DME Arranged:  3-N-1, [place] rolling DME Agency:  [place]., will be delivered to bedside prior to d/c.  HH Arranged:  PT, OT, RN [place] Agency:  [facility]  Status of Service:  Completed, signed off  If discussed at [place] of Stay Meetings, dates discussed:    Additional Comments:  Sharin Mons, RN 09/03/2018, 9:57 AM

## 2018-09-03 NOTE — Progress Notes (Signed)
discharge instructions given. Pt verbalized understanding and all questions were answered.

## 2018-09-03 NOTE — Discharge Summary (Signed)
SAHAND GOSCH XBJ:[id number] DOB: 1961/07/08 DOA: 08/13/2018  PCP: System, Pcp Not In  Admit date: 08/13/2018  Discharge date: 09/03/2018  Admitted From: Home   Disposition:  Home   Recommendations for Outpatient Follow-up:   Follow up with PCP in 1-2 weeks  PCP Please obtain BMP/CBC, 2 view CXR in 1week,  (see Discharge instructions)   PCP Please follow up on the following pending results:    Home Health: PT,RN   Equipment/Devices: None  Consultations: Urology Discharge Condition: Fair   CODE STATUS: Full   Diet Recommendation: Heart Healthy - Low Carb    Chief Complaint  Patient presents with  . Groin Swelling  . Weakness  . Vomiting  . Diarrhea     Brief history of present illness from the day of admission and additional interim summary    Patient is a 58 y.o. male with history of hypertension, BPH, OSA prostate cancer status post external beam radiation-presented with sepsis secondary to rectal ulcer with perforation with seeding of the GU tract causing epididymitis/prostatitis.  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 Further hospital course complicated by worsening penile/scrotal infection and AKI.  Evaluated by general surgery and urology-underwent diverting colostomy, and has had multiple trips to the OR for scrotal debridement.  See below for further details                                                                  Hospital Course    Severe sepsis secondary to perforated rectal ulcer with prostatitis/epididymitis and penoscrotal abscess along with eggerthella lenta bacteremia: Sepsis pathophysiology has resolved, he is also undergone diverting colostomy which appears stable, seen by urology underwent scrotal debridement on 08/23/2018 along with 08/27/2018, he was finally taken  to the OR one more time on 09/01/2018, drains were removed Foley was removed, discussed with urologist Dr. Tammi Klippel on 09/02/2018.  Continue daily dressing changes switch to oral Augmentin for another 10 days and discharge home today.  Patient wishes to go home plan was discussed with wife, he will get home RN for dressing change and wife is to learn dressing changes today as well.  We will also get outpatient wound care along with urology follow-up within a week.  AKI: This was hemodynamically mediated ATN which has resolved after supportive care and hydration.   Multifactorial anemia secondary to acute blood loss anemia and secondary to acute/critical illness: No further hematochezia-hemoglobin levels are relatively stable-no further hematochezia, suspicion for acute illness causing him to have persistent anemia rather than further GI bleeding.  Has been transfused 1 unit of PRBC so far, follow CBC periodically PCP along with anemia panel, outpatient age-appropriate anemia work-up to be initiated and conducted by PCP.   Hyperbilirubinemia: Due to sepsis resolved, CT scan unremarkable from hepatobiliary standpoint.  Hypertension: Controlled-continue metoprolol, prazosin.   Dyslipidemia: Continue statin  BPH: Continue Flomax  OSA: CPAP nightly-he refused refused while he was here.  Moderate protein calorie malnutrition: Continue supplements  Acute debility/deconditioning: Likely secondary to acute illness-initially plans were for SNF placement-however patient refusing SNF and wants to go home.   Will arrange for home with home PT.     Discharge diagnosis     Principal Problem:   Severe sepsis (HCC) Active Problems:   Acute renal failure (ARF) (HCC)   Serum total bilirubin elevated   Hematochezia   Hyponatremia   Normal anion gap metabolic acidosis   Testicular microlithiasis   OSA (obstructive sleep apnea)   Sepsis (HCC)   Malnutrition of moderate  degree    Discharge instructions    Discharge Instructions    Diet - low sodium heart healthy   Complete by:  As directed    Discharge instructions   Complete by:  As directed    Follow with Primary MD in 7 days   Get CBC, CMP checked  by Primary MD  in 5-7 days    Activity: As tolerated with Full fall precautions use walker/cane & assistance as needed  Disposition Home    Diet: Heart Healthy Low Carb, check CBGs QAC-HS   Special Instructions: If you have smoked or chewed Tobacco  in the last 2 yrs please stop smoking, stop any regular Alcohol  and or any Recreational drug use.  On your next visit with your primary care physician please Get Medicines reviewed and adjusted.  Please request your Prim.MD to go over all Hospital Tests and Procedure/Radiological results at the follow up, please get all Hospital records sent to your Prim MD by signing hospital release before you go home.  If you experience worsening of your admission symptoms, develop shortness of breath, life threatening emergency, suicidal or homicidal thoughts you must seek medical attention immediately by calling 911 or calling your MD immediately  if symptoms less severe.  You Must read complete instructions/literature along with all the possible adverse reactions/side effects for all the Medicines you take and that have been prescribed to you. Take any new Medicines after you have completely understood and accpet all the possible adverse reactions/side effects.   Increase activity slowly   Complete by:  As directed       Discharge Medications   Allergies as of 09/03/2018   No Known Allergies     Medication List    TAKE these medications   amLODipine 10 MG tablet Commonly known as:  NORVASC Take 10 mg by mouth daily.   amoxicillin-clavulanate 875-125 MG tablet Commonly known as:  AUGMENTIN Take 1 tablet by mouth every 12 (twelve) hours.   aspirin 81 MG chewable tablet Chew 81 mg by mouth daily.    atorvastatin 40 MG tablet Commonly known as:  LIPITOR Take 40 mg by mouth daily with supper.   chlorproMAZINE 25 MG tablet Commonly known as:  THORAZINE Take 25 mg by mouth at bedtime.   clonazePAM 1 MG tablet Commonly known as:  KLONOPIN Take 1 mg by mouth at bedtime.   Dressing Sponges 4"X4" Pads Provide 1 month supply for daily wet and dry dressing - along with sterile Saline bottle.   DULoxetine 60 MG capsule Commonly known as:  CYMBALTA Take 60 mg by mouth daily with supper.   ferrous sulfate 325 (65 FE) MG tablet Take 1 tablet (325 mg total) by mouth daily with breakfast.   LACTOBACILLUS PO Take 1  tablet by mouth daily.   lisinopril 40 MG tablet Commonly known as:  PRINIVIL,ZESTRIL Take 40 mg by mouth daily.   metoprolol tartrate 100 MG tablet Commonly known as:  LOPRESSOR Take 100 mg by mouth 2 (two) times daily.   multivitamin with minerals tablet Take 1 tablet by mouth daily.   omeprazole 40 MG capsule Commonly known as:  PRILOSEC Take 40 mg by mouth daily.   prazosin 1 MG capsule Commonly known as:  MINIPRESS Take 2 mg by mouth at bedtime.   tamsulosin 0.4 MG Caps capsule Commonly known as:  FLOMAX Take 0.4 mg by mouth daily as needed (frequent urination).   Vitamin D3 50 MCG (2000 UT) Tabs Take 2,000 Units by mouth daily.            Durable Medical Equipment  (From admission, onward)         Start     Ordered   08/26/18 1343  For home use only DME 3 n 1  Once     08/26/18 1343   08/26/18 1343  For home use only DME Walker rolling  Once    Question:  Patient needs a walker to treat with the following condition  Answer:  Weakness   08/26/18 1343          [facility] Follow up on 09/09/2018.   Why:  9:50 am, Dr.Cammie Fulp Contact information: [place] [address] [phone]       Ileana Roup, MD. Go on 09/21/2018.   Specialty:   General Surgery Why:  Follow up appointment scheduled for 10:00 AM. Please arrive 15-30 min prior to appointment time. Bring photo ID and insurance information.  Contact information: [place] [address] [phone]        [facility] Follow up on 09/02/2018.   Specialty:  [facility] Contact information: [place] [address] [phone]        [facility]              Follow up on 09/07/2018.   Why:  9am, Dr.Robson Contact information: [place] [phone]       Alexis Frock, MD. Schedule an appointment as soon as possible for a visit in 1 week(s).   Specialty:  Urology Contact information: [place] [address] [phone]           Major procedures and Radiology Reports - PLEASE review detailed and final reports thoroughly  -       Ct Abdomen Pelvis Wo Contrast  Result Date: 08/13/2018 CLINICAL DATA:  Groin swelling EXAM: CT ABDOMEN AND PELVIS WITHOUT CONTRAST TECHNIQUE: Multidetector CT imaging of the abdomen and pelvis was performed following the standard protocol without IV contrast. COMPARISON:  MRI 07/10/2018 FINDINGS: Lower chest: Lung bases demonstrate no acute consolidation or effusion. The heart size is normal. Small hiatal hernia. Hepatobiliary: No focal liver abnormality is seen. No gallstones, gallbladder wall thickening, or biliary dilatation. Pancreas: Unremarkable. No pancreatic ductal dilatation or surrounding inflammatory changes. Spleen: Normal in size without focal abnormality. Adrenals/Urinary Tract: Adrenal glands are within normal limits. No hydronephrosis. Thick-walled urinary bladder Stomach/Bowel: Stomach is nonenlarged. No dilated small bowel. Extensive gas collection Between the rectum and prostate. Vascular/Lymphatic: Mild aortic atherosclerosis. No aneurysm. No significantly  enlarged lymph nodes. Reproductive: Metallic  densities along the posterior aspect of the prostate. Large gas collections around the prostate gland. Moderate edema of the scrotum and around the penis. Other: No free air. Fat in the left inguinal canal. Edema within the pubic soft tissues. Gas and fluid collection extending from the rectal area and along both sides of the prostate gland. Musculoskeletal: No acute or suspicious abnormality. IMPRESSION: 1. Moderate edema and mild inflammatory changes involving the scrotum and soft tissues about the penis, but without soft tissue emphysema. Mild edema within the pubic soft tissues. 2. Abnormal gas and fluid collection between the rectum and prostate gland and extending along the right and left aspects of the prostate gland, felt to correspond to previously demonstrated rectal ulcer and sinus tracks. 3. Thick-walled appearance of the urinary bladder, possible cystitis Electronically Signed   By: Donavan Foil M.D.   On: 08/13/2018 23:19   Dg Chest 2 View  Result Date: 08/13/2018 CLINICAL DATA:  Acute onset of genital swelling. Fever. EXAM: CHEST - 2 VIEW COMPARISON:  Chest radiograph performed 07/07/2018 FINDINGS: The lungs are well-aerated. Minimal left basilar atelectasis is noted. There is no evidence of pleural effusion or pneumothorax. The heart is normal in size; the mediastinal contour is within normal limits. No acute osseous abnormalities are seen. IMPRESSION: Minimal left basilar atelectasis noted. Lungs otherwise clear. Electronically Signed   By: Garald Balding M.D.   On: 08/13/2018 22:16   Ct Pelvis Wo Contrast  Result Date: 08/17/2018 CLINICAL DATA:  Proctitis with micro perforation, now with abscess at penile shaft, worsening pelvic swelling EXAM: CT PELVIS WITHOUT CONTRAST TECHNIQUE: Multidetector CT imaging of the pelvis was performed following the standard protocol without intravenous contrast. COMPARISON:  CT abdomen/pelvis dated 09/09/2018. MR  pelvis dated 07/10/2018. FINDINGS: Motion degraded images. Urinary Tract: Thick-walled bladder with indwelling Foley catheter and nondependent gas. Bowel: Visualized bowel is poorly evaluated but grossly unremarkable. Vascular/Lymphatic: Mild atherosclerotic calcifications of the bilateral iliac vessels. No suspicious pelvic lymphadenopathy. Reproductive: Gas within the bilateral seminal vesicles. Additional gas along the retro prostatic soft tissues (series 3/image 37) and extending along the right pelvic sidewall (series 3/image 31), unchanged. Fiducial radiation markers along the posterior prostate. One of these markers is now displaced posterior to the rectum (series 3/image 29), new from recent CT. Other: Subcutaneous stranding along the anterior lower pelvic wall, left greater than right (series 3/image 32), progressive. Additional fluid/stranding along the proximal penile shaft (series 3/image 54) with diffuse scrotal edema (series 3/image 68). This appearance is compatible with cellulitis. No drainable fluid collection/abscess on CT. Musculoskeletal: Visualized osseous structures are within normal limits. IMPRESSION: Gas along the bilateral seminal vesicles and retro prostatic soft tissues, likely related to known rectal microperforation, unchanged. Associated cellulitis involving the lower pelvic wall, penile shaft, and scrotum. No drainable fluid collection/abscess on CT. Displaced fiducial radiation marker now posterior to the rectum, new from recent CT. Electronically Signed   By: Julian Hy M.D.   On: 08/17/2018 11:34   US Renal  Result Date: 08/14/2018 CLINICAL DATA:  Acute renal failure. EXAM: RENAL / URINARY TRACT ULTRASOUND COMPLETE COMPARISON:  Noncontrast CT yesterday. FINDINGS: Right Kidney: Renal measurements: 12.6 x 5.2 x 6.1 cm = volume: 207 mL . Echogenicity within normal limits. No mass or hydronephrosis visualized. Left Kidney: Renal measurements: 12.8 x 5.1 x 5.7 cm = volume: 195  mL. Echogenicity within normal limits. No mass or hydronephrosis visualized. Bladder: Bladder wall thickening of 9 mm, as seen on CT. IMPRESSION: 1. Bladder wall thickening, similar  to CT yesterday. 2. Unremarkable sonographic appearance of both kidneys. No hydronephrosis. Electronically Signed   By: Keith Rake M.D.   On: 08/14/2018 03:48   US Scrotum W/doppler  Result Date: 08/13/2018 CLINICAL DATA:  Initial evaluation for acute testicular pain, swelling. EXAM: SCROTAL ULTRASOUND DOPPLER ULTRASOUND OF THE TESTICLES TECHNIQUE: Complete ultrasound examination of the testicles, epididymis, and other scrotal structures was performed. Color and spectral Doppler ultrasound were also utilized to evaluate blood flow to the testicles. COMPARISON:  None. FINDINGS: Right testicle Measurements: 3.5 x 2.5 x 1.9 cm. No mass lesion. Testicular microlithiasis noted. Left testicle Measurements: 3.9 x 2.2 x 2.3 cm. No mass lesion. Testicular microlithiasis noted. Right epididymis:  Normal in size and appearance. Left epididymis: Diffusely increased vascularity seen within the left epididymis, suggesting acute epididymitis. Hydrocele:  Bilateral hydroceles noted. Varicocele:  Bilateral varicoceles noted. Pulsed Doppler interrogation of both testes demonstrates normal low resistance arterial and venous waveforms bilaterally. Incidental note made of a probable small fat containing right inguinal hernia. IMPRESSION: 1. Increased vascularity within the left epididymis, suggesting acute epididymitis. No evidence for testicular torsion. 2. Small moderate bilateral hydroceles, likely reactive. 3. Small bilateral varicoceles. 4. Probable small fat containing right inguinal hernia. 5. Testicular microlithiasis. Current literature suggests that testicular microlithiasis is not a significant independent risk factor for development of testicular carcinoma, and that follow up imaging is not warranted in the absence of other risk factors.  Monthly testicular self-examination and annual physical exams are considered appropriate surveillance. If patient has other risk factors for testicular carcinoma, then referral to Urology should be considered. (Reference: DeCastro, et al.: A 5-Year Follow up Study of Asymptomatic Men with Testicular Microlithiasis. J Urol 2008; 182:9937-1696.) Electronically Signed   By: Jeannine Boga M.D.   On: 08/13/2018 22:48   US Abdomen Limited Ruq  Result Date: 08/14/2018 CLINICAL DATA:  Initial evaluation for elevated AST with leukocytosis. EXAM: ULTRASOUND ABDOMEN LIMITED RIGHT UPPER QUADRANT COMPARISON:  None. FINDINGS: Gallbladder: No gallstones or wall thickening visualized. No sonographic Murphy sign noted by sonographer. Common bile duct: Diameter: 4.6 mm Liver: No focal lesion identified. Within normal limits in parenchymal echogenicity. Portal vein is patent on color Doppler imaging with normal direction of blood flow towards the liver. IMPRESSION: Normal right upper quadrant ultrasound. No cholelithiasis, evidence for acute cholecystitis, or biliary dilatation. Electronically Signed   By: Jeannine Boga M.D.   On: 08/14/2018 03:43    Micro Results     No results found for this or any previous visit (from the past 240 hour(s)).  Today   Subjective    Phillip Blackwell today has no headache,no chest abdominal pain,no new weakness tingling or numbness, feels much better wants to go home today.     Objective   Blood pressure (!) 143/77, pulse 79, temperature 98.2 F (36.8 C), temperature source Oral, resp. rate 18, height 6' (1.829 m), weight 86 kg, SpO2 97 %.   Intake/Output Summary (Last 24 hours) at 09/03/2018 0855 Last data filed at 09/02/2018 1500 Gross per 24 hour  Intake -  Output 300 ml  Net -300 ml    Exam  Awake Alert, Oriented X 3, No new F.N deficits, Normal affect [place].AT,PERRAL Supple Neck,No JVD, No cervical lymphadenopathy appriciated.  Symmetrical Chest wall  movement, Good air movement bilaterally, CTAB RRR,No Gallops, Rubs or new Murmurs, No Parasternal Heave +ve B.Sounds, Abd Soft, No tenderness, No organomegaly appriciated, No rebound - guarding or rigidity. No Cyanosis, Clubbing or edema, No new Rash or  bruise Scrotal postop site under bandage.    Data Review   CBC w Diff:  Lab Results  Component Value Date   WBC 10.0 09/02/2018   HGB 9.0 (L) 09/02/2018   HCT 28.5 (L) 09/02/2018   PLT 309 09/02/2018   LYMPHOPCT 3 08/13/2018   MONOPCT 5 08/13/2018   EOSPCT 0 08/13/2018   BASOPCT 0 08/13/2018    CMP:  Lab Results  Component Value Date   NA 137 09/02/2018   K 4.2 09/02/2018   CL 108 09/02/2018   CO2 21 (L) 09/02/2018   BUN 13 09/02/2018   CREATININE 1.14 09/02/2018   PROT 6.2 (L) 08/19/2018   ALBUMIN 1.2 (L) 08/19/2018   BILITOT 1.6 (H) 08/19/2018   ALKPHOS 66 08/19/2018   AST 42 (H) 08/19/2018   ALT 31 08/19/2018  .   Total Time in preparing paper work, data evaluation and todays exam - 35 minutes  Lala Lund M.D on 09/03/2018 at 8:55 AM  [facility]   Office  [phone]

## 2018-09-03 NOTE — Consult Note (Signed)
[place] Nurse wound consult note Patient receiving care in [facility] [phone].  Spouse present.  Spouse performed 100% of the wound care to all wounds independently.  She required just 2 or 3 "tips" for coaching to make what she was doing easier.  Printed, step by step instructions were provided as she had requested these yesterday.  I provided her with some supplies (roll gauze, saline, ABD pads, mesh panties, cotton tipped applicators), to ensure she had supplies while she awaits [place] services.  She and the patient both expressed their comfort with the wound care and the ostomy care and feel confident in what they need to do for both. Val Riles, RN, MSN, CWOCN, CNS-BC, pager [phone]

## 2018-09-07 ENCOUNTER — Encounter (HOSPITAL_BASED_OUTPATIENT_CLINIC_OR_DEPARTMENT_OTHER): Payer: No Typology Code available for payment source | Attending: Internal Medicine

## 2018-09-07 DIAGNOSIS — I10 Essential (primary) hypertension: Secondary | ICD-10-CM | POA: Diagnosis not present

## 2018-09-07 DIAGNOSIS — K627 Radiation proctitis: Secondary | ICD-10-CM | POA: Insufficient documentation

## 2018-09-07 DIAGNOSIS — Y842 Radiological procedure and radiotherapy as the cause of abnormal reaction of the patient, or of later complication, without mention of misadventure at the time of the procedure: Secondary | ICD-10-CM | POA: Insufficient documentation

## 2018-09-07 DIAGNOSIS — Z8546 Personal history of malignant neoplasm of prostate: Secondary | ICD-10-CM | POA: Insufficient documentation

## 2018-09-07 DIAGNOSIS — G4733 Obstructive sleep apnea (adult) (pediatric): Secondary | ICD-10-CM | POA: Insufficient documentation

## 2018-09-09 ENCOUNTER — Encounter: Payer: Self-pay | Admitting: Family Medicine

## 2018-09-09 ENCOUNTER — Ambulatory Visit: Payer: Non-veteran care | Attending: Family Medicine | Admitting: Family Medicine

## 2018-09-09 VITALS — BP 150/90 | HR 89 | Temp 98.0°F | Ht 72.0 in | Wt 180.0 lb

## 2018-09-09 DIAGNOSIS — K626 Ulcer of anus and rectum: Secondary | ICD-10-CM

## 2018-09-09 DIAGNOSIS — R319 Hematuria, unspecified: Secondary | ICD-10-CM

## 2018-09-09 DIAGNOSIS — C61 Malignant neoplasm of prostate: Secondary | ICD-10-CM | POA: Diagnosis not present

## 2018-09-09 DIAGNOSIS — R7309 Other abnormal glucose: Secondary | ICD-10-CM

## 2018-09-09 DIAGNOSIS — R748 Abnormal levels of other serum enzymes: Secondary | ICD-10-CM

## 2018-09-09 DIAGNOSIS — K6289 Other specified diseases of anus and rectum: Secondary | ICD-10-CM

## 2018-09-09 DIAGNOSIS — R17 Unspecified jaundice: Secondary | ICD-10-CM | POA: Diagnosis not present

## 2018-09-09 DIAGNOSIS — E440 Moderate protein-calorie malnutrition: Secondary | ICD-10-CM

## 2018-09-09 DIAGNOSIS — D649 Anemia, unspecified: Secondary | ICD-10-CM

## 2018-09-09 LAB — POCT URINALYSIS DIP (CLINITEK)
Bilirubin, UA: NEGATIVE
Glucose, UA: NEGATIVE mg/dL
Ketones, POC UA: NEGATIVE mg/dL
Leukocytes, UA: NEGATIVE
Nitrite, UA: NEGATIVE
POC PROTEIN,UA: >=300 — AB
Spec Grav, UA: 1.025
Urobilinogen, UA: 0.2 U/dL
pH, UA: 5

## 2018-09-09 MED ORDER — HYDROCODONE-ACETAMINOPHEN 5-325 MG PO TABS: 1.0000 | ORAL_TABLET | Freq: Four times a day (QID) | ORAL | 0 refills | Status: DC | PRN

## 2018-09-09 NOTE — Progress Notes (Signed)
Subjective:    Patient ID: Phillip Blackwell, male    DOB: 1961-04-14, 58 y.o.   MRN: [id number]  HPI       58 year old male new to the practice who is status post hospitalization on 08/13/2018 through 09/03/2018 due to sepsis from a rectal ulcer with perforation and seeding of the GU tract causing epididymitis prostatitis and patient also with sepsis with positive blood cultures.  Per patient's hospital discharge summary, his hospital course was complicated by worsening penile/scrotal infection and acute kidney injury.  Patient required surgery and underwent a diverting colostomy and had to have multiple scrotal debridement procedures in the operating room.  At the time of patient's hospital discharge, patient was discharged on Augmentin for continued treatment of prior sepsis/bacteremia from perforated rectal ulcer with prostatitis/epididymitis and pain and scrotal abscess along with a growth Eggerthella Lenta bacteremia.  Patient had resolution of acute kidney injury- hemodynamically mediated acute tubular necrosis which is resolved after supportive care and hydration.  Patient also had multifactorial anemia secondary to acute blood loss and secondary to acute/critical illness.  Patient did have transfusion of 1 units of packed red blood cells during hospitalization and patient's hemoglobin at time of discharge was 9.0 on 09/02/2018 with normal MCV of 90.8.  Patient also had elevated total bilirubin but CT scan showed no abnormalities of the hepatobiliary system.  Patient's hypertension at the time of discharge was controlled on metoprolol and prazosin and patient was to continue statin medication for dyslipidemia.  Patient was to continue the use of Flomax for treatment of BPH.  Patient also with history of obstructive sleep apnea requiring CPAP and patient refused use of CPAP while in the hospital.  Patient also with moderate protein calorie malnutrition for which patient received nutritional supplements who  patient with diagnosis of debility.       At today's visit, patient presents for follow-up of recent hospitalization.  Patient reports no current fever or chills.  Patient is taking the prescribed antibiotic.  Patient reports that he is having a significant amount of rectal pain with his pain being an 8-9 constantly and patient with a painful throbbing sensation in the rectal area.  Patient reports that he was not prescribed any pain medication at the time of hospital discharge therefore states he has not taken any pain medication.  Patient states that home health nurse did come out earlier this week to change his dressings.  Patient however has noticed that when he urinates, he also has leakage of urine from the posterior scrotum.  Patient therefore states that his bandages are getting wet and he needs additional bandages.  Patient states he has not yet scheduled follow-up with urology but intends to go over to the urology office today after leaving this appointment.  Patient does not feel as if he has had any recent fever or chills status post hospital discharge.  Patient does have occasional dull headache.  Patient also now with presence of colostomy.  Patient reports that he will need colostomy supplies.  Patient states that he was previously followed at the [facility] in [place] however he states that he was recently told that he has too high of an income and that he is no longer eligible for treatment through the [place] but patient states that this is where he had also been receiving his medications.  Patient reports some sensation of increased urinary frequency but states that he does not feel that he is getting out a lot of  urine output as he did prior to hospitalization.  Regarding elevated liver enzymes/serum total bilirubin during hospitalization, patient denies any right upper quadrant pain, no diarrhea/loose stools and no evidence of jaundice.  Patient reports no blood in the stool and no dark  stool related to his anemia.  Patient does not have a personal history of diabetes but did have some elevated glucose during his recent hospitalization.  Labs done in the ED/per the hospital did show anemia.  Patient denies any unusual bruising or bleeding.  Patient is also trying to drink nutritional supplements to help with malnutrition noted per labs during hospitalization.  Past Medical History:  Diagnosis Date  . Anxiety   . Depression   . GERD (gastroesophageal reflux disease)   . Hypertension   . Rectal bleeding 07/07/2018  . Sleep apnea   . Stroke [facility])    2014   Past Surgical History:  Procedure Laterality Date  . BIOPSY  07/10/2018   Procedure: BIOPSY;  Surgeon: Otis Brace, MD;  Location: [place];  Service: Gastroenterology;;  . Consuela Mimes N/A 08/18/2018   Procedure: CYSTOSCOPY;  Surgeon: Alexis Frock, MD;  Location: [place];  Service: Urology;  Laterality: N/A;  . FLEXIBLE SIGMOIDOSCOPY N/A 07/10/2018   Procedure: FLEXIBLE SIGMOIDOSCOPY;  Surgeon: Otis Brace, MD;  Location: [place];  Service: Gastroenterology;  Laterality: N/A;  Adult EGD Scope. Peds biopsy forceps.  Marland Kitchen HERNIA REPAIR    . IRRIGATION AND DEBRIDEMENT ABSCESS N/A 08/18/2018   Procedure: IRRIGATION AND DEBRIDEMENT penal scrotal ABSCESS;  Surgeon: Alexis Frock, MD;  Location: [place];  Service: Urology;  Laterality: N/A;  . IRRIGATION AND DEBRIDEMENT ABSCESS N/A 09/01/2018   Procedure: IRRIGATION AND DEBRIDEMENT ABSCESS;  Surgeon: Alexis Frock, MD;  Location: [place];  Service: Urology;  Laterality: N/A;  . LAPAROSCOPIC PARTIAL COLECTOMY N/A 08/23/2018   Procedure: LAPAROSCOPIC  END COLOSTOMY CREATION;  Surgeon: Donnie Mesa, MD;  Location: [place];  Service: General;  Laterality: N/A;  . SCROTAL EXPLORATION N/A 08/23/2018   Procedure: SECONDARY PENILE/SCROTUM DEBRIDEMENT;  Surgeon: Alexis Frock, MD;  Location: [place];  Service: Urology;  Laterality: N/A;  . SCROTAL EXPLORATION N/A 08/26/2018     Procedure: THIRD STAGE PENIS AND SCROTAL IRRIGATION AND  DEBRIDEMENT;  Surgeon: Alexis Frock, MD;  Location: [place];  Service: Urology;  Laterality: N/A;   Family History  Problem Relation Age of Onset  . Hypertension Mother    Social History   Tobacco Use  . Smoking status: Never Smoker  . Smokeless tobacco: Never Used  Substance Use Topics  . Alcohol use: No  . Drug use: No  No Known Allergies                Review of Systems  Constitutional: Positive for fatigue. Negative for chills and fever.  HENT: Negative for sore throat and trouble swallowing.   Respiratory: Negative for cough and shortness of breath.   Cardiovascular: Negative for chest pain, palpitations and leg swelling.  Gastrointestinal: Negative for abdominal pain, constipation and diarrhea.  Endocrine: Negative for polydipsia and polyphagia.  Genitourinary: Positive for difficulty urinating, frequency and scrotal swelling. Negative for dysuria and hematuria.  Musculoskeletal: Positive for back pain and gait problem.  Neurological: Positive for headaches. Negative for dizziness.  Hematological: Negative for adenopathy. Does not bruise/bleed easily.       Objective:   Physical Exam BP (!) 150/90 (BP Location: Left Arm, Patient Position: Standing, Cuff Size: Large)   Pulse 89   Temp 98 F (36.7 C) (  Oral)   Ht 6' (1.829 m)   Wt 180 lb (81.6 kg)   SpO2 99%   BMI 24.41 kg/m   Nurse's notes and vital signs reviewed General-well-nourished, well-developed male in no acute distress but who appears fatigued.  Patient is sitting on a rolling stool as patient states that he had increased rectal pain while sitting on the exam table.  Patient is accompanied by his wife at today's visit Neck-supple, no lymphadenopathy, no thyromegaly Lungs-clear to auscultation bilaterally Cardiovascular-regular rate and rhythm No CVA tenderness Abdomen-soft, nontender, patient with a presence of a left-sided mid abdominal  colostomy bag, abdomen is nontender Extremities-no edema Back-no CVA tenderness palpation with lumbosacral spasm and mild tenderness to palpation Psych-normal mood and judgment       Assessment & Plan:  1. Rectal ulcer Patient's hospital discharge summary as well as labs, imaging and notes during hospitalization were reviewed.  Patient will have CBC in follow-up of anemia and CMP in follow-up of acute kidney injury, elevated total bilirubin and abnormal sepsis.  Patient with continued rectal pain and patient also reports that he is having leakage of urine from the posterior aspect of the scrotum.  Patient does not yet have an upcoming urology follow-up.  Patient was encouraged to call the urology office while here today to see if they wish to see him in the office today or if they want to have patient go to the emergency department for further evaluation.  Will attempt to obtain urinalysis and send for culture if abnormal to see if there is actual bacterial growth/presence of UTI.  Patient was discharged on Augmentin.  Prescription was sent into patient's pharmacy for short-term supply of hydrocodone acetaminophen 5/325 to take for rectal pain status post surgery with recurrent debridement of the rectal ulcer and patient with continued rectal pain but patient was also made aware that urology may insist on rehospitalization at this time - CBC with Differential - Comprehensive metabolic panel - HYDROcodone-acetaminophen (NORCO/VICODIN) 5-325 MG tablet; Take 1 tablet by mouth every 6 (six) hours as needed for moderate pain.  Dispense: 30 tablet; Refill: 0  2. Prostate cancer [facility]) Patient with history of prostate cancer for which he received radiation therapy in 2018 but patient has had complications including rectal ulcer.  Patient is going to contact his urologist today regarding follow-up of rectal ulcer and leakage of urine from the posterior scrotum. - POCT URINALYSIS DIP ([facility])  3. Serum  total bilirubin elevated Patient had hepatic function panel during hospitalization on 08/19/2018 with elevation of total bilirubin to 1.6 with normal being 0.3-1.2.  Increases in total bilirubin can send no issues with the gallbladder/ductal system.  Patient will have recheck of total bilirubin as part of a panic panel.  Patient did have abdominal CT as well as abdominal ultrasound.  Abdominal ultrasound showed no gallstones or wall thickening and no evidence for acute cholecystitis or biliary dilatation.  Patient also had normal echogenicity of the liver on ultrasound.  4. Elevated glucose During hospitalization, patient had some blood work which showed elevated glucose including 1 level of 153.  Patient will have hemoglobin A1c and complete metabolic panel at today's visit to see if patient may be prediabetic or diabetic and if so, further medical decision making regarding treatment will be determined based on these results. - Hemoglobin A1c - Comprehensive metabolic panel  5. Rectal pain Patient is to follow-up with urology regarding rectal pain as he may need further debridement/surgical intervention.  If patient cannot be  worked into urology today or does not have re-admission, prescription was sent to patient's pharmacy for hydrocodone acetaminophen 1 every 6 hours as needed for moderate pain #30 with no refill - HYDROcodone-acetaminophen (NORCO/VICODIN) 5-325 MG tablet; Take 1 tablet by mouth every 6 (six) hours as needed for moderate pain.  Dispense: 30 tablet; Refill: 0  6. Anemia, unspecified type Patient with anemia during his recent hospitalization which required transfusion of 1 unit of packed red blood cells and patient with hemoglobin of 9.7 at discharge and patient will have repeat CBC to make sure that hemoglobin level has not dropped which would require transfusion.  CBC will also look for elevated white blood cell count as patient with continued rectal pain and history of rectal ulcer  as well as history of sepsis. - CBC with Differential  7. Elevated liver enzymes Patient will have repeat liver enzymes as part of CMP as these were elevated during the hospital cessation including AST but patient with normal appearance to the liver and gallbladder on both CT and ultrasound - Comprehensive metabolic panel  8. Malnutrition of moderate degree Patient had labs during hospitalization indicative of malnutrition of moderate degree which was likely secondary to patient's chronic illness.  Patient is to continue the use of high-protein dietary supplements to help with malnutrition and hopefully by improving patient's nutritional status, patient's debility will improve as well as improvement in speed of healing  9. Hematuria, unspecified type Patient was asked to give urinalysis secondary to his complaint of urinary frequency as well as patient with CT scan during hospitalization showing thickened wall of the bladder consistent with cystitis.  Patient had hematuria on urinalysis.  Patient's urine will be sent for culture and patient will be notified of need for antibiotic therapy based on these results.  Patient is also to follow-up with urology. - Urine Culture  An After Visit Summary was printed and given to the patient.  Allergies as of 09/09/2018   No Known Allergies     Medication List       Accurate as of September 09, 2018 11:59 PM. Always use your most recent med list.        amLODipine 10 MG tablet Commonly known as:  NORVASC Take 10 mg by mouth daily.   amoxicillin-clavulanate 875-125 MG tablet Commonly known as:  AUGMENTIN Take 1 tablet by mouth every 12 (twelve) hours.   aspirin 81 MG chewable tablet Chew 81 mg by mouth daily.   atorvastatin 40 MG tablet Commonly known as:  LIPITOR Take 40 mg by mouth daily with supper.   chlorproMAZINE 25 MG tablet Commonly known as:  THORAZINE Take 25 mg by mouth at bedtime.   clonazePAM 1 MG tablet Commonly known as:   KLONOPIN Take 1 mg by mouth at bedtime.   Dressing Sponges 4"X4" Pads Provide 1 month supply for daily wet and dry dressing - along with sterile Saline bottle.   DULoxetine 60 MG capsule Commonly known as:  CYMBALTA Take 60 mg by mouth daily with supper.   ferrous sulfate 325 (65 FE) MG tablet Take 1 tablet (325 mg total) by mouth daily with breakfast.   HYDROcodone-acetaminophen 5-325 MG tablet Commonly known as:  NORCO/VICODIN Take 1 tablet by mouth every 6 (six) hours as needed for moderate pain.   LACTOBACILLUS PO Take 1 tablet by mouth daily.   lisinopril 40 MG tablet Commonly known as:  PRINIVIL,ZESTRIL Take 40 mg by mouth daily.   metoprolol tartrate 100 MG tablet Commonly known  as:  LOPRESSOR Take 100 mg by mouth 2 (two) times daily.   multivitamin with minerals tablet Take 1 tablet by mouth daily.   omeprazole 40 MG capsule Commonly known as:  PRILOSEC Take 40 mg by mouth daily.   prazosin 1 MG capsule Commonly known as:  MINIPRESS Take 2 mg by mouth at bedtime.   tamsulosin 0.4 MG Caps capsule Commonly known as:  FLOMAX Take 0.4 mg by mouth daily as needed (frequent urination).   Vitamin D3 50 MCG (2000 UT) Tabs Take 2,000 Units by mouth daily.       Return in about 1 week (around 09/16/2018) for rectal pain; f/u with Urology ASAP or go to the ED.

## 2018-09-09 NOTE — Progress Notes (Signed)
Hospital follow up, pain in rectum (constant throbbing)

## 2018-09-09 NOTE — Progress Notes (Signed)
   Subjective:    Patient ID: Phillip Blackwell, male    DOB: 1961/02/03, 58 y.o.   MRN: [id number]  HPI    Review of Systems     Objective:   Physical Exam BP (!) 150/90 (BP Location: Left Arm, Patient Position: Standing, Cuff Size: Large)   Pulse 89   Temp 98 F (36.7 C) (Oral)   Ht 6' (1.829 m)   Wt 180 lb (81.6 kg)   SpO2 99%   BMI 24.41 kg/m         Assessment & Plan:

## 2018-09-10 LAB — CBC WITH DIFFERENTIAL/PLATELET
Basophils Absolute: 0.1 x10E3/uL (ref 0.0–0.2)
Basos: 1 %
EOS (ABSOLUTE): 0.1 x10E3/uL (ref 0.0–0.4)
Eos: 1 %
Hematocrit: 30.2 % — ABNORMAL LOW (ref 37.5–51.0)
Hemoglobin: 9.5 g/dL — ABNORMAL LOW (ref 13.0–17.7)
Immature Grans (Abs): 0 x10E3/uL (ref 0.0–0.1)
Immature Granulocytes: 0 %
Lymphocytes Absolute: 3.1 x10E3/uL (ref 0.7–3.1)
Lymphs: 28 %
MCH: 28.7 pg (ref 26.6–33.0)
MCHC: 31.5 g/dL (ref 31.5–35.7)
MCV: 91 fL (ref 79–97)
Monocytes Absolute: 1.1 x10E3/uL — ABNORMAL HIGH (ref 0.1–0.9)
Monocytes: 10 %
Neutrophils Absolute: 6.8 x10E3/uL (ref 1.4–7.0)
Neutrophils: 60 %
Platelets: 383 x10E3/uL (ref 150–450)
RBC: 3.31 x10E6/uL — ABNORMAL LOW (ref 4.14–5.80)
RDW: 15.1 % (ref 11.6–15.4)
WBC: 11.3 x10E3/uL — ABNORMAL HIGH (ref 3.4–10.8)

## 2018-09-10 LAB — COMPREHENSIVE METABOLIC PANEL WITH GFR
ALT: 67 IU/L — ABNORMAL HIGH (ref 0–44)
AST: 35 IU/L (ref 0–40)
Albumin/Globulin Ratio: 0.9 — ABNORMAL LOW (ref 1.2–2.2)
Albumin: 3.4 g/dL — ABNORMAL LOW (ref 3.8–4.9)
Alkaline Phosphatase: 108 IU/L (ref 39–117)
BUN/Creatinine Ratio: 11 (ref 9–20)
BUN: 12 mg/dL (ref 6–24)
Bilirubin Total: 0.7 mg/dL (ref 0.0–1.2)
CO2: 19 mmol/L — ABNORMAL LOW (ref 20–29)
Calcium: 9.4 mg/dL (ref 8.7–10.2)
Chloride: 111 mmol/L — ABNORMAL HIGH (ref 96–106)
Creatinine, Ser: 1.06 mg/dL (ref 0.76–1.27)
GFR calc Af Amer: 88 mL/min/1.73
GFR calc non Af Amer: 76 mL/min/1.73
Globulin, Total: 4 g/dL (ref 1.5–4.5)
Glucose: 100 mg/dL — ABNORMAL HIGH (ref 65–99)
Potassium: 4.6 mmol/L (ref 3.5–5.2)
Sodium: 140 mmol/L (ref 134–144)
Total Protein: 7.4 g/dL (ref 6.0–8.5)

## 2018-09-10 LAB — HEMOGLOBIN A1C
Est. average glucose Bld gHb Est-mCnc: 105 mg/dL
Hgb A1c MFr Bld: 5.3 % (ref 4.8–5.6)

## 2018-09-11 LAB — URINE CULTURE

## 2018-09-12 ENCOUNTER — Emergency Department (HOSPITAL_COMMUNITY)
Admission: EM | Admit: 2018-09-12 | Discharge: 2018-09-12 | Disposition: A | Discharge status: Home / Self Care | Payer: No Typology Code available for payment source | Attending: Emergency Medicine | Admitting: Emergency Medicine

## 2018-09-12 ENCOUNTER — Encounter (HOSPITAL_COMMUNITY): Payer: Self-pay

## 2018-09-12 DIAGNOSIS — Z8673 Personal history of transient ischemic attack (TIA), and cerebral infarction without residual deficits: Secondary | ICD-10-CM | POA: Diagnosis not present

## 2018-09-12 DIAGNOSIS — Z5189 Encounter for other specified aftercare: Secondary | ICD-10-CM

## 2018-09-12 DIAGNOSIS — I10 Essential (primary) hypertension: Secondary | ICD-10-CM | POA: Insufficient documentation

## 2018-09-12 DIAGNOSIS — Z79899 Other long term (current) drug therapy: Secondary | ICD-10-CM | POA: Diagnosis not present

## 2018-09-12 DIAGNOSIS — Z8546 Personal history of malignant neoplasm of prostate: Secondary | ICD-10-CM | POA: Diagnosis not present

## 2018-09-12 DIAGNOSIS — E785 Hyperlipidemia, unspecified: Secondary | ICD-10-CM | POA: Insufficient documentation

## 2018-09-12 DIAGNOSIS — Z9889 Other specified postprocedural states: Secondary | ICD-10-CM

## 2018-09-12 DIAGNOSIS — Z933 Colostomy status: Secondary | ICD-10-CM | POA: Insufficient documentation

## 2018-09-12 DIAGNOSIS — Z7982 Long term (current) use of aspirin: Secondary | ICD-10-CM | POA: Insufficient documentation

## 2018-09-12 DIAGNOSIS — L7622 Postprocedural hemorrhage and hematoma of skin and subcutaneous tissue following other procedure: Secondary | ICD-10-CM | POA: Insufficient documentation

## 2018-09-12 LAB — BASIC METABOLIC PANEL
Anion gap: 9 (ref 5–15)
BUN: 10 mg/dL (ref 6–20)
CO2: 20 mmol/L — ABNORMAL LOW (ref 22–32)
Calcium: 8.8 mg/dL — ABNORMAL LOW (ref 8.9–10.3)
Chloride: 111 mmol/L (ref 98–111)
Creatinine, Ser: 1.13 mg/dL (ref 0.61–1.24)
GFR calc Af Amer: >60 mL/min (ref >60)
GFR calc non Af Amer: >60 mL/min (ref >60)
Glucose, Bld: 95 mg/dL (ref 70–99)
Potassium: 4 mmol/L (ref 3.5–5.1)
Sodium: 140 mmol/L (ref 135–145)

## 2018-09-12 LAB — CBC WITH DIFFERENTIAL/PLATELET
Abs Immature Granulocytes: 0.05 10*3/uL (ref 0.00–0.07)
Basophils Absolute: 0.1 10*3/uL (ref 0.0–0.1)
Basophils Relative: 1 %
Eosinophils Absolute: 0.2 10*3/uL (ref 0.0–0.5)
Eosinophils Relative: 2 %
HCT: 29.1 % — ABNORMAL LOW (ref 39.0–52.0)
HEMOGLOBIN: 8.7 g/dL — AB (ref 13.0–17.0)
Immature Granulocytes: 1 %
Lymphocytes Relative: 28 %
Lymphs Abs: 2.8 10*3/uL (ref 0.7–4.0)
MCH: 28.6 pg (ref 26.0–34.0)
MCHC: 29.9 g/dL — ABNORMAL LOW (ref 30.0–36.0)
MCV: 95.7 fL (ref 80.0–100.0)
Monocytes Absolute: 1.1 10*3/uL — ABNORMAL HIGH (ref 0.1–1.0)
Monocytes Relative: 11 %
Neutro Abs: 5.9 10*3/uL (ref 1.7–7.7)
Neutrophils Relative %: 57 %
Platelets: 325 10*3/uL (ref 150–400)
RBC: 3.04 MIL/uL — ABNORMAL LOW (ref 4.22–5.81)
RDW: 16 % — ABNORMAL HIGH (ref 11.5–15.5)
WBC: 10 10*3/uL (ref 4.0–10.5)
nRBC: 0 % (ref 0.0–0.2)

## 2018-09-12 NOTE — ED Notes (Signed)
Patient verbalizes understanding of discharge instructions. Opportunity for questioning and answers were provided. Armband removed by staff, pt discharged from ED. Supplies given for wound changes. Pt wheeled to lobby .

## 2018-09-12 NOTE — ED Provider Notes (Signed)
[facility] EMERGENCY DEPARTMENT Provider Note   CSN: [id number] Arrival date & time: 09/12/18  1558     History   Chief Complaint Chief Complaint  Patient presents with  . Post-op Problem    HPI Phillip Blackwell is a 58 y.o. male who presents to the emergency department for postoperative bleeding.  Patient was admitted to the hospital after being admitted for severe sepsis secondary to a rectal ulcer with perforation and seeding of the genitourinary tract.  He had associated epididymitis and prostatitis along with positive blood cultures for Eggerthella lenta.  He had surgical debridement by Dr. Tresa Moore along with general surgery evaluation and underwent a colostomy along with multiple trips for scrotal debridement.  Patient currently has welling Foley catheter in place.  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  He had significant bleeding from the region just above the shaft of his penis where he places packing daily.  Blood had soaked through that gauze and also soaked his pant legs.  He removed his pants and applied direct pressure however bleeding continued to soak the gauze he was applying pressure with.  He presented to the emergency department for evaluation.  During his weight he went to the restroom and noticed a large clot which he passed from his rectum.  He denies fevers chills chest pain shortness of breath feelings of presyncope.  Does not take any blood thinners  HPI  Past Medical History:  Diagnosis Date  . Anxiety   . Depression   . GERD (gastroesophageal reflux disease)   . Hypertension   . Rectal bleeding 07/07/2018  . Sleep apnea   . Stroke [facility])    2014    Patient Active Problem List   Diagnosis Date Noted  . Malnutrition of moderate degree 08/19/2018  . Sepsis ([place]) 08/18/2018  . Severe sepsis ([place]) 08/14/2018  . Acute renal failure (ARF) ([place]) 08/14/2018  . Serum total bilirubin elevated 08/14/2018    . Hematochezia 08/14/2018  . Hyponatremia 08/14/2018  . Normal anion gap metabolic acidosis 98/33/8250  . 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 OSA (obstructive sleep apnea) 08/14/2018  . Lower GI bleed 07/07/2018  . HTN (hypertension) 07/07/2018  . HLD (hyperlipidemia) 07/07/2018  . Prostate cancer ([place]) 07/07/2018    Past Surgical History:  Procedure Laterality Date  . BIOPSY  07/10/2018   Procedure: BIOPSY;  Surgeon: Otis Brace, MD;  Location: [facility];  Service: Gastroenterology;;  . Consuela Mimes N/A 08/18/2018   Procedure: CYSTOSCOPY;  Surgeon: Alexis Frock, MD;  Location: [facility];  Service: Urology;  Laterality: N/A;  . FLEXIBLE SIGMOIDOSCOPY N/A 07/10/2018   Procedure: FLEXIBLE SIGMOIDOSCOPY;  Surgeon: Otis Brace, MD;  Location: [facility];  Service: Gastroenterology;  Laterality: N/A;  Adult EGD Scope. Peds biopsy forceps.  Marland Kitchen HERNIA REPAIR    . IRRIGATION AND DEBRIDEMENT ABSCESS N/A 08/18/2018   Procedure: IRRIGATION AND DEBRIDEMENT penal scrotal ABSCESS;  Surgeon: Alexis Frock, MD;  Location: [facility];  Service: Urology;  Laterality: N/A;  . IRRIGATION AND DEBRIDEMENT ABSCESS N/A 09/01/2018   Procedure: IRRIGATION AND DEBRIDEMENT ABSCESS;  Surgeon: Alexis Frock, MD;  Location: [facility];  Service: Urology;  Laterality: N/A;  . LAPAROSCOPIC PARTIAL COLECTOMY N/A 08/23/2018   Procedure: LAPAROSCOPIC  END COLOSTOMY CREATION;  Surgeon: Donnie Mesa, MD;  Location: [facility];  Service: General;  Laterality: N/A;  . SCROTAL EXPLORATION N/A 08/23/2018   Procedure: SECONDARY PENILE/SCROTUM DEBRIDEMENT;  Surgeon: Alexis Frock, MD;  Location[facility];  Service: Urology;  Laterality: N/A;  . SCROTAL EXPLORATION N/A 08/26/2018   Procedure: THIRD STAGE PENIS AND SCROTAL IRRIGATION AND  DEBRIDEMENT;  Surgeon: Alexis Frock, MD;  Location: [facility];  Service: Urology;  Laterality: N/A;        Home Medications    Prior to Admission medications   Medication Sig Start Date  End Date Taking? Authorizing Provider  amLODipine (NORVASC) 10 MG tablet Take 10 mg by mouth daily.    [provider]  amoxicillin-clavulanate (AUGMENTIN) 875-125 MG tablet Take 1 tablet by mouth every 12 (twelve) hours. 09/03/18   Thurnell Lose, MD  aspirin 81 MG chewable tablet Chew 81 mg by mouth daily.    [provider]  atorvastatin (LIPITOR) 40 MG tablet Take 40 mg by mouth daily with supper.     [provider]  chlorproMAZINE (THORAZINE) 25 MG tablet Take 25 mg by mouth at bedtime.    [provider]  Cholecalciferol (VITAMIN D3) 2000 units TABS Take 2,000 Units by mouth daily.     [provider]  clonazePAM (KLONOPIN) 1 MG tablet Take 1 mg by mouth at bedtime.     [provider]  DULoxetine (CYMBALTA) 60 MG capsule Take 60 mg by mouth daily with supper.     [provider]  ferrous sulfate 325 (65 FE) MG tablet Take 1 tablet (325 mg total) by mouth daily with breakfast. 07/11/18   Thurnell Lose, MD  Gauze Pads & Dressings (DRESSING SPONGES) 4"X4" PADS Provide 1 month supply for daily wet and dry dressing - along with sterile Saline bottle. 09/03/18   Thurnell Lose, MD  HYDROcodone-acetaminophen (NORCO/VICODIN) 5-325 MG tablet Take 1 tablet by mouth every 6 (six) hours as needed for moderate pain. 09/09/18   Fulp, Cammie, MD  LACTOBACILLUS PO Take 1 tablet by mouth daily.    [provider]  lisinopril (PRINIVIL,ZESTRIL) 40 MG tablet Take 40 mg by mouth daily.    [provider]  metoprolol tartrate (LOPRESSOR) 100 MG tablet Take 100 mg by mouth 2 (two) times daily.     [provider]  Multiple Vitamins-Minerals (MULTIVITAMIN WITH MINERALS) tablet Take 1 tablet by mouth daily.    [provider]  omeprazole (PRILOSEC) 40 MG capsule Take 40 mg by mouth daily.     [provider]  prazosin (MINIPRESS) 1 MG capsule Take 2 mg by mouth at bedtime.     [provider]   tamsulosin (FLOMAX) 0.4 MG CAPS capsule Take 0.4 mg by mouth daily as needed (frequent urination).    [provider]    Family History Family History  Problem Relation Age of Onset  . Hypertension Mother     Social History Social History   Tobacco Use  . Smoking status: Never Smoker  . Smokeless tobacco: Never Used  Substance Use Topics  . Alcohol use: No  . Drug use: No     Allergies   Patient has no known allergies.   Review of Systems Review of Systems Ten systems reviewed and are negative for acute change, except as noted in the HPI.    Physical Exam Updated Vital Signs BP (!) 163/94   Pulse 88   Temp 97.7 F (36.5 C) (Oral)   Resp 16   SpO2 99%   Physical Exam Vitals signs and nursing note reviewed.  Constitutional:      General: He is not in acute distress.    Appearance:  He is well-developed. He is not diaphoretic.  HENT:     Head: Normocephalic and atraumatic.  Eyes:     General: No scleral icterus.    Conjunctiva/sclera: Conjunctivae normal.  Neck:     Musculoskeletal: Normal range of motion and neck supple.  Cardiovascular:     Rate and Rhythm: Normal rate and regular rhythm.     Heart sounds: Normal heart sounds.  Pulmonary:     Effort: Pulmonary effort is normal. No respiratory distress.     Breath sounds: Normal breath sounds.  Abdominal:     Palpations: Abdomen is soft.     Tenderness: There is no abdominal tenderness.  Genitourinary:    Comments: Indwelling Foley catheter in place.  Urine in the urine collecting bag is without blood.  Areas of denuded tissue along the shaft of the penis both the dorsal and ventral surface.  There is induration of the mons pubis and blood oozes when pressure is applied from the area just above the shaft of the penis.  No active bleeding at this time Skin:    General: Skin is warm and dry.  Neurological:     Mental Status: He is alert.  Psychiatric:        Behavior: Behavior normal.       ED Treatments / Results  Labs (all labs ordered are listed, but only abnormal results are displayed) Labs Reviewed  BASIC METABOLIC PANEL - Abnormal; Notable for the following components:      Result Value   CO2 20 (*)    Calcium 8.8 (*)    All other components within normal limits  CBC WITH DIFFERENTIAL/PLATELET - Abnormal; Notable for the following components:   RBC 3.04 (*)    Hemoglobin 8.7 (*)    HCT 29.1 (*)    MCHC 29.9 (*)    RDW 16.0 (*)    Monocytes Absolute 1.1 (*)    All other components within normal limits    EKG None  Radiology No results found.  Procedures Procedures (including critical care time)  Medications Ordered in ED Medications - No data to display   Initial Impression / Assessment and Plan / ED Course  I have reviewed the triage vital signs and the nursing notes.  Pertinent labs & imaging results that were available during my care of the patient were reviewed by me and considered in my medical decision making (see chart for details).  Clinical Course as of Sep 14 1800  Nancy Fetter Sep 12, 2018  1827 Spoke with Dr. Tresa Moore about the patient.  He states that he expects bleeding to worsen with his increased activity.  Plan to check his blood counts and basic metabolic panel.  Continue to monitor the patient's vital signs.  If there is no significant abnormality plan for discharge.   [AH]    Clinical Course User Index [AH] Margarita Mail, PA-C     I have reviewed the patient's labs. HGB is at baseline. The patient's BMP shows no significant abnormality. The patient's vitals signs have been stable throughout his visit.  I have discussed the work up and reassured the patient that heis sage for discharge. The patient will have close follow up with Dr. Tresa Moore. Encouraged more rest but to expect increased bleeding with more activity. I also discussed home care and return precautions. The patient appears appropriate for discharge at this time.  Final  Clinical Impressions(s) / ED Diagnoses   Final diagnoses:  Visit for wound check  Post-operative state  ED Discharge Orders    None       Margarita Mail, PA-C 09/14/18 1802    Pattricia Boss, MD 09/17/18 1537

## 2018-09-12 NOTE — ED Triage Notes (Signed)
Pt reports heavy bleeding from incision site today. Pt had wound debridement on penis about 10 days ago. Pt reports no bleeding or problems at incision site until today.

## 2018-09-12 NOTE — Discharge Instructions (Signed)
Contact a health care provider if: Your wound does not seem to be healing properly. You have a fever. Get help right away if: You have more redness, swelling, or pain around your wound. You have more fluid coming from your wound. Your wound feels warm to the touch. You have pus or a bad smell coming from your wound. Your wound breaks open farther. You have redness streaking or spreading from your wound. You have excessive bleeding from your wound.

## 2018-09-13 ENCOUNTER — Telehealth: Payer: Self-pay | Admitting: *Deleted

## 2018-09-13 NOTE — Telephone Encounter (Addendum)
Left message on voicemail to return call.    ----- Message from Antony Blackbird, MD sent at 09/11/2018  1:11 PM EST ----- Patient's urine culture did not show any evidence of infection.  Patient with abnormal urinalysis showing blood.  Patient is to follow-up with urology

## 2018-09-15 ENCOUNTER — Encounter: Payer: Self-pay | Admitting: *Deleted

## 2018-09-15 NOTE — Telephone Encounter (Signed)
Patient called back to get their results. Patient states that they can also be sent a letter with results in order to help with communication. Please follow up.

## 2018-09-15 NOTE — Telephone Encounter (Signed)
Attempt to call patient. No answer.  

## 2018-09-16 ENCOUNTER — Encounter: Payer: Self-pay | Admitting: *Deleted

## 2018-09-16 ENCOUNTER — Encounter (HOSPITAL_COMMUNITY): Payer: Self-pay | Admitting: Emergency Medicine

## 2018-09-16 ENCOUNTER — Other Ambulatory Visit (HOSPITAL_COMMUNITY): Payer: Non-veteran care

## 2018-09-16 ENCOUNTER — Other Ambulatory Visit: Payer: Self-pay

## 2018-09-16 ENCOUNTER — Inpatient Hospital Stay (HOSPITAL_COMMUNITY): Payer: No Typology Code available for payment source

## 2018-09-16 ENCOUNTER — Inpatient Hospital Stay (HOSPITAL_COMMUNITY)
Admission: EM | Admit: 2018-09-16 | Discharge: 2018-09-18 | DRG: 394 | Disposition: A | Discharge status: Home Health | Payer: No Typology Code available for payment source | Attending: Internal Medicine | Admitting: Internal Medicine

## 2018-09-16 DIAGNOSIS — D62 Acute posthemorrhagic anemia: Secondary | ICD-10-CM | POA: Diagnosis not present

## 2018-09-16 DIAGNOSIS — K625 Hemorrhage of anus and rectum: Secondary | ICD-10-CM | POA: Diagnosis not present

## 2018-09-16 DIAGNOSIS — K626 Ulcer of anus and rectum: Secondary | ICD-10-CM | POA: Diagnosis present

## 2018-09-16 DIAGNOSIS — G4733 Obstructive sleep apnea (adult) (pediatric): Secondary | ICD-10-CM | POA: Diagnosis not present

## 2018-09-16 DIAGNOSIS — Z7982 Long term (current) use of aspirin: Secondary | ICD-10-CM

## 2018-09-16 DIAGNOSIS — Z923 Personal history of irradiation: Secondary | ICD-10-CM | POA: Diagnosis not present

## 2018-09-16 DIAGNOSIS — I10 Essential (primary) hypertension: Secondary | ICD-10-CM | POA: Diagnosis present

## 2018-09-16 DIAGNOSIS — N400 Enlarged prostate without lower urinary tract symptoms: Secondary | ICD-10-CM | POA: Diagnosis present

## 2018-09-16 DIAGNOSIS — N451 Epididymitis: Secondary | ICD-10-CM | POA: Diagnosis not present

## 2018-09-16 DIAGNOSIS — K219 Gastro-esophageal reflux disease without esophagitis: Secondary | ICD-10-CM | POA: Diagnosis present

## 2018-09-16 DIAGNOSIS — G4730 Sleep apnea, unspecified: Secondary | ICD-10-CM | POA: Diagnosis present

## 2018-09-16 DIAGNOSIS — F329 Major depressive disorder, single episode, unspecified: Secondary | ICD-10-CM | POA: Diagnosis present

## 2018-09-16 DIAGNOSIS — Z79899 Other long term (current) drug therapy: Secondary | ICD-10-CM

## 2018-09-16 DIAGNOSIS — Z792 Long term (current) use of antibiotics: Secondary | ICD-10-CM | POA: Diagnosis not present

## 2018-09-16 DIAGNOSIS — F419 Anxiety disorder, unspecified: Secondary | ICD-10-CM | POA: Diagnosis not present

## 2018-09-16 DIAGNOSIS — Z8546 Personal history of malignant neoplasm of prostate: Secondary | ICD-10-CM

## 2018-09-16 DIAGNOSIS — T148XXA Other injury of unspecified body region, initial encounter: Secondary | ICD-10-CM

## 2018-09-16 DIAGNOSIS — E785 Hyperlipidemia, unspecified: Secondary | ICD-10-CM | POA: Diagnosis present

## 2018-09-16 DIAGNOSIS — D500 Iron deficiency anemia secondary to blood loss (chronic): Secondary | ICD-10-CM

## 2018-09-16 DIAGNOSIS — Z8249 Family history of ischemic heart disease and other diseases of the circulatory system: Secondary | ICD-10-CM

## 2018-09-16 DIAGNOSIS — D509 Iron deficiency anemia, unspecified: Secondary | ICD-10-CM | POA: Diagnosis present

## 2018-09-16 DIAGNOSIS — Z79891 Long term (current) use of opiate analgesic: Secondary | ICD-10-CM

## 2018-09-16 DIAGNOSIS — R55 Syncope and collapse: Secondary | ICD-10-CM | POA: Diagnosis present

## 2018-09-16 DIAGNOSIS — N419 Inflammatory disease of prostate, unspecified: Secondary | ICD-10-CM | POA: Diagnosis not present

## 2018-09-16 DIAGNOSIS — R7881 Bacteremia: Secondary | ICD-10-CM | POA: Diagnosis present

## 2018-09-16 DIAGNOSIS — N360 Urethral fistula: Secondary | ICD-10-CM | POA: Diagnosis present

## 2018-09-16 DIAGNOSIS — Z933 Colostomy status: Secondary | ICD-10-CM | POA: Diagnosis not present

## 2018-09-16 DIAGNOSIS — C61 Malignant neoplasm of prostate: Secondary | ICD-10-CM | POA: Diagnosis not present

## 2018-09-16 DIAGNOSIS — Z8673 Personal history of transient ischemic attack (TIA), and cerebral infarction without residual deficits: Secondary | ICD-10-CM

## 2018-09-16 LAB — CBC
HCT: 29 % — ABNORMAL LOW (ref 39.0–52.0)
Hemoglobin: 8 g/dL — ABNORMAL LOW (ref 13.0–17.0)
MCH: 27.9 pg (ref 26.0–34.0)
MCHC: 27.6 g/dL — ABNORMAL LOW (ref 30.0–36.0)
MCV: 101 fL — ABNORMAL HIGH (ref 80.0–100.0)
Platelets: 211 10*3/uL (ref 150–400)
RBC: 2.87 MIL/uL — ABNORMAL LOW (ref 4.22–5.81)
RDW: 17.1 % — ABNORMAL HIGH (ref 11.5–15.5)
WBC: 11 10*3/uL — ABNORMAL HIGH (ref 4.0–10.5)
nRBC: 0 % (ref 0.0–0.2)

## 2018-09-16 LAB — CBC WITH DIFFERENTIAL/PLATELET
Abs Immature Granulocytes: 0.05 10*3/uL (ref 0.00–0.07)
Basophils Absolute: 0 10*3/uL (ref 0.0–0.1)
Basophils Relative: 0 %
Eosinophils Absolute: 0.2 10*3/uL (ref 0.0–0.5)
Eosinophils Relative: 2 %
HCT: 24.8 % — ABNORMAL LOW (ref 39.0–52.0)
Hemoglobin: 7.2 g/dL — ABNORMAL LOW (ref 13.0–17.0)
Immature Granulocytes: 0 %
LYMPHS ABS: 2.4 10*3/uL (ref 0.7–4.0)
Lymphocytes Relative: 20 %
MCH: 27.9 pg (ref 26.0–34.0)
MCHC: 29 g/dL — ABNORMAL LOW (ref 30.0–36.0)
MCV: 96.1 fL (ref 80.0–100.0)
MONOS PCT: 9 %
Monocytes Absolute: 1 10*3/uL (ref 0.1–1.0)
Neutro Abs: 8.5 10*3/uL — ABNORMAL HIGH (ref 1.7–7.7)
Neutrophils Relative %: 69 %
Platelets: 227 10*3/uL (ref 150–400)
RBC: 2.58 MIL/uL — ABNORMAL LOW (ref 4.22–5.81)
RDW: 16 % — ABNORMAL HIGH (ref 11.5–15.5)
WBC: 12.2 10*3/uL — ABNORMAL HIGH (ref 4.0–10.5)
nRBC: 0 % (ref 0.0–0.2)

## 2018-09-16 LAB — ECHOCARDIOGRAM COMPLETE

## 2018-09-16 LAB — COMPREHENSIVE METABOLIC PANEL
ALT: 22 U/L (ref 0–44)
ANION GAP: 9 (ref 5–15)
AST: 20 U/L (ref 15–41)
Albumin: 2.3 g/dL — ABNORMAL LOW (ref 3.5–5.0)
Alkaline Phosphatase: 63 U/L (ref 38–126)
BUN: 13 mg/dL (ref 6–20)
CO2: 17 mmol/L — ABNORMAL LOW (ref 22–32)
Calcium: 8.2 mg/dL — ABNORMAL LOW (ref 8.9–10.3)
Chloride: 113 mmol/L — ABNORMAL HIGH (ref 98–111)
Creatinine, Ser: 0.92 mg/dL (ref 0.61–1.24)
GFR calc non Af Amer: >60 mL/min (ref >60)
Glucose, Bld: 115 mg/dL — ABNORMAL HIGH (ref 70–99)
Potassium: 4 mmol/L (ref 3.5–5.1)
SODIUM: 139 mmol/L (ref 135–145)
Total Bilirubin: 0.9 mg/dL (ref 0.3–1.2)
Total Protein: 6 g/dL — ABNORMAL LOW (ref 6.5–8.1)

## 2018-09-16 LAB — BASIC METABOLIC PANEL
Anion gap: 6 (ref 5–15)
BUN: 14 mg/dL (ref 6–20)
CO2: 20 mmol/L — ABNORMAL LOW (ref 22–32)
Calcium: 8.1 mg/dL — ABNORMAL LOW (ref 8.9–10.3)
Chloride: 114 mmol/L — ABNORMAL HIGH (ref 98–111)
Creatinine, Ser: 1.1 mg/dL (ref 0.61–1.24)
GFR calc Af Amer: >60 mL/min (ref >60)
GFR calc non Af Amer: >60 mL/min (ref >60)
Glucose, Bld: 161 mg/dL — ABNORMAL HIGH (ref 70–99)
Potassium: 4.1 mmol/L (ref 3.5–5.1)
Sodium: 140 mmol/L (ref 135–145)

## 2018-09-16 LAB — PREPARE RBC (CROSSMATCH)

## 2018-09-16 LAB — ABO/RH: ABO/RH(D): O POS

## 2018-09-16 LAB — TROPONIN I
Troponin I: <0.03 ng/mL (ref <0.03)
Troponin I: <0.03 ng/mL (ref <0.03)

## 2018-09-16 MED ORDER — HYDROCODONE-ACETAMINOPHEN 5-325 MG PO TABS
1.0000 | ORAL_TABLET | Freq: Four times a day (QID) | ORAL | Status: DC | PRN
  Administered 2018-09-16 – 2018-09-18 (×5): 2 via ORAL
  Filled 2018-09-16 (×6): qty 2

## 2018-09-16 MED ORDER — ONDANSETRON HCL 4 MG PO TABS: 4.0000 mg | ORAL_TABLET | Freq: Four times a day (QID) | ORAL | Status: DC | PRN

## 2018-09-16 MED ORDER — SODIUM CHLORIDE 0.9 % IV BOLUS
1000.0000 mL | Freq: Once | INTRAVENOUS | Status: AC
  Administered 2018-09-16: 1000 mL via INTRAVENOUS

## 2018-09-16 MED ORDER — MORPHINE SULFATE (PF) 2 MG/ML IV SOLN
2.0000 mg | INTRAVENOUS | Status: DC | PRN
  Administered 2018-09-16: 2 mg via INTRAVENOUS
  Filled 2018-09-16: qty 1

## 2018-09-16 MED ORDER — ONDANSETRON HCL 4 MG/2ML IJ SOLN: 4.0000 mg | Freq: Four times a day (QID) | INTRAMUSCULAR | Status: DC | PRN

## 2018-09-16 MED ORDER — CLONAZEPAM 1 MG PO TABS
1.0000 mg | ORAL_TABLET | Freq: Every day | ORAL | Status: DC
  Administered 2018-09-16 – 2018-09-17 (×2): 1 mg via ORAL
  Filled 2018-09-16 (×2): qty 1

## 2018-09-16 MED ORDER — MORPHINE SULFATE (PF) 2 MG/ML IV SOLN
2.0000 mg | Freq: Once | INTRAVENOUS | Status: AC
  Administered 2018-09-16: 2 mg via INTRAVENOUS
  Filled 2018-09-16: qty 1

## 2018-09-16 MED ORDER — SODIUM CHLORIDE 0.9 % IV SOLN
10.0000 mL/h | Freq: Once | INTRAVENOUS | Status: AC
  Administered 2018-09-16: 10 mL/h via INTRAVENOUS

## 2018-09-16 MED ORDER — HYDROCODONE-ACETAMINOPHEN 5-325 MG PO TABS
1.0000 | ORAL_TABLET | Freq: Once | ORAL | Status: AC
  Administered 2018-09-16: 1 via ORAL

## 2018-09-16 MED ORDER — HYDROCODONE-ACETAMINOPHEN 5-325 MG PO TABS
1.0000 | ORAL_TABLET | Freq: Four times a day (QID) | ORAL | Status: DC | PRN
  Administered 2018-09-16: 1 via ORAL
  Filled 2018-09-16: qty 1

## 2018-09-16 MED ORDER — HYDROCODONE-ACETAMINOPHEN 5-325 MG PO TABS
ORAL_TABLET | ORAL | Status: AC
  Administered 2018-09-16: 1 via ORAL
  Filled 2018-09-16: qty 1

## 2018-09-16 MED ORDER — METOPROLOL TARTRATE 50 MG PO TABS
100.0000 mg | ORAL_TABLET | Freq: Two times a day (BID) | ORAL | Status: DC
  Administered 2018-09-16 – 2018-09-18 (×5): 100 mg via ORAL
  Filled 2018-09-16 (×5): qty 2

## 2018-09-16 MED ORDER — FERROUS SULFATE 325 (65 FE) MG PO TABS
325.0000 mg | ORAL_TABLET | Freq: Every day | ORAL | Status: DC
  Administered 2018-09-17 – 2018-09-18 (×2): 325 mg via ORAL
  Filled 2018-09-16 (×2): qty 1

## 2018-09-16 MED ORDER — CHLORPROMAZINE HCL 25 MG PO TABS
25.0000 mg | ORAL_TABLET | Freq: Every day | ORAL | Status: DC
  Administered 2018-09-16 – 2018-09-17 (×2): 25 mg via ORAL
  Filled 2018-09-16 (×3): qty 1

## 2018-09-16 MED ORDER — PRAZOSIN HCL 1 MG PO CAPS
2.0000 mg | ORAL_CAPSULE | Freq: Every day | ORAL | Status: DC
  Administered 2018-09-16 – 2018-09-17 (×2): 2 mg via ORAL
  Filled 2018-09-16 (×3): qty 2

## 2018-09-16 MED ORDER — PANTOPRAZOLE SODIUM 40 MG PO TBEC
40.0000 mg | DELAYED_RELEASE_TABLET | Freq: Every day | ORAL | Status: DC
  Administered 2018-09-16 – 2018-09-18 (×3): 40 mg via ORAL
  Filled 2018-09-16 (×3): qty 1

## 2018-09-16 MED ORDER — AMLODIPINE BESYLATE 10 MG PO TABS
10.0000 mg | ORAL_TABLET | Freq: Every day | ORAL | Status: DC
  Administered 2018-09-16 – 2018-09-18 (×3): 10 mg via ORAL
  Filled 2018-09-16 (×3): qty 1

## 2018-09-16 MED ORDER — OXYCODONE HCL 5 MG PO TABS: 5.0000 mg | ORAL_TABLET | ORAL | Status: DC | PRN

## 2018-09-16 MED ORDER — SODIUM CHLORIDE 0.9 % IV SOLN
INTRAVENOUS | Status: DC
  Administered 2018-09-16: 11:00:00 via INTRAVENOUS

## 2018-09-16 MED ORDER — OXYCODONE HCL 5 MG PO TABS
5.0000 mg | ORAL_TABLET | ORAL | Status: DC | PRN
  Administered 2018-09-17 – 2018-09-18 (×4): 5 mg via ORAL
  Filled 2018-09-16 (×4): qty 1

## 2018-09-16 MED ORDER — ATORVASTATIN CALCIUM 40 MG PO TABS
40.0000 mg | ORAL_TABLET | Freq: Every day | ORAL | Status: DC
  Administered 2018-09-16 – 2018-09-17 (×2): 40 mg via ORAL
  Filled 2018-09-16 (×2): qty 1

## 2018-09-16 MED ORDER — DULOXETINE HCL 60 MG PO CPEP
60.0000 mg | ORAL_CAPSULE | Freq: Every day | ORAL | Status: DC
  Administered 2018-09-16 – 2018-09-17 (×2): 60 mg via ORAL
  Filled 2018-09-16 (×2): qty 1

## 2018-09-16 NOTE — ED Notes (Signed)
Okay per Dr Stark Jock not to re-pack wound/incision sites as pt is being admitted to [facility] and nurse will have to assess wounds upon admission. Pt says he would prefer to wait until he is admitted for wound care instead of having it done twice. Pt has clean sterile gauze over incision sites at this time.

## 2018-09-16 NOTE — Progress Notes (Signed)
Pt has declined use of CPAP tonight.  RT to monitor and assess as needed.   

## 2018-09-16 NOTE — H&P (Signed)
TRH H&P    Patient Demographics:    Phillip Blackwell, is a 58 y.o. male  MRN: [id number]  DOB - 12/24/1960  Admit Date - 09/16/2018  Referring MD/NP/PA: Dr. Stark Jock  Outpatient Primary MD for the patient is [place]  Patient coming from: Home  Chief complaint-bleeding   HPI:    Phillip Blackwell  is a 58 y.o. male, with history of hypertension, BPH, obstructive sleep apnea, prostate cancer status post external beam radiation complicated by rectal ulcer with perforation with seeding of GU tract causing epididymitis/prostatitis and penile scrotal abscess along with eggerthella lenta bacteremia, treated with Augmentin.  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  Today patient came to hospital after he noticed excessive bleeding from the scrotal/penile wound at home.  Incidentally in the ED patient also had a brief syncopal episode while he was having a blood draw for labs.  In the ED lab work showed hemoglobin of 7.2. Urology was consulted by ED physician and they recommended patient to be transferred to [facility] where he can be evaluated. He denies chest pain or shortness of breath. Denies nausea vomiting or diarrhea. Complains of low abdominal pain. Had a stroke in 2013.  Currently on secondary prevention with aspirin    Review of systems:    In addition to the HPI above,   All other systems reviewed and are negative.    Past History of the following :    Past Medical History:  Diagnosis Date  . Anxiety   . Depression   . GERD (gastroesophageal reflux disease)   . Hypertension   . Rectal bleeding 07/07/2018  . Sleep apnea   . Stroke [facility])    2014      Past Surgical History:  Procedure Laterality Date  . BIOPSY  07/10/2018   Procedure: BIOPSY;  Surgeon: Otis Brace, MD;  Location: [place];  Service: Gastroenterology;;  . Consuela Mimes N/A 08/18/2018   Procedure: CYSTOSCOPY;  Surgeon: Alexis Frock, MD;  Location: [place];  Service: Urology;  Laterality: N/A;  . FLEXIBLE SIGMOIDOSCOPY N/A 07/10/2018   Procedure: FLEXIBLE SIGMOIDOSCOPY;  Surgeon: Otis Brace, MD;  Location: [place];  Service: Gastroenterology;  Laterality: N/A;  Adult EGD Scope. Peds biopsy forceps.  Marland Kitchen HERNIA REPAIR    . IRRIGATION AND DEBRIDEMENT ABSCESS N/A 08/18/2018   Procedure: IRRIGATION AND DEBRIDEMENT penal scrotal ABSCESS;  Surgeon: Alexis Frock, MD;  Location: [place];  Service: Urology;  Laterality: N/A;  . IRRIGATION AND DEBRIDEMENT ABSCESS N/A 09/01/2018   Procedure: IRRIGATION AND DEBRIDEMENT ABSCESS;  Surgeon: Alexis Frock, MD;  Location: [place];  Service: Urology;  Laterality: N/A;  . LAPAROSCOPIC PARTIAL COLECTOMY N/A 08/23/2018   Procedure: LAPAROSCOPIC  END COLOSTOMY CREATION;  Surgeon: Donnie Mesa, MD;  Location: [place];  Service: General;  Laterality: N/A;  . SCROTAL EXPLORATION N/A 08/23/2018   Procedure: SECONDARY PENILE/SCROTUM DEBRIDEMENT;  Surgeon: Alexis Frock, MD;  Location: [place];  Service: Urology;  Laterality: N/A;  . SCROTAL EXPLORATION N/A 08/26/2018   Procedure: THIRD STAGE PENIS AND  SCROTAL IRRIGATION AND  DEBRIDEMENT;  Surgeon: Alexis Frock, MD;  Location: [place];  Service: Urology;  Laterality: N/A;      Social History:      Social History   Tobacco Use  . Smoking status: Never Smoker  . Smokeless tobacco: Never Used  Substance Use Topics  . Alcohol use: No       Family History :     Family History  Problem Relation Age of Onset  . Hypertension Mother       Home Medications:   Prior to Admission medications   Medication Sig Start Date End Date Taking? Authorizing Provider  amLODipine (NORVASC) 10 MG tablet Take 10 mg by mouth daily.    [provider]  amoxicillin-clavulanate (AUGMENTIN) 875-125 MG tablet Take 1 tablet by mouth every 12 (twelve) hours. 09/03/18   Thurnell Lose, MD  aspirin  81 MG chewable tablet Chew 81 mg by mouth daily.    [provider]  atorvastatin (LIPITOR) 40 MG tablet Take 40 mg by mouth daily with supper.     [provider]  chlorproMAZINE (THORAZINE) 25 MG tablet Take 25 mg by mouth at bedtime.    [provider]  Cholecalciferol (VITAMIN D3) 2000 units TABS Take 2,000 Units by mouth daily.     [provider]  clonazePAM (KLONOPIN) 1 MG tablet Take 1 mg by mouth at bedtime.     [provider]  DULoxetine (CYMBALTA) 60 MG capsule Take 60 mg by mouth daily with supper.     [provider]  ferrous sulfate 325 (65 FE) MG tablet Take 1 tablet (325 mg total) by mouth daily with breakfast. 07/11/18   Thurnell Lose, MD  Gauze Pads & Dressings (DRESSING SPONGES) 4"X4" PADS Provide 1 month supply for daily wet and dry dressing - along with sterile Saline bottle. 09/03/18   Thurnell Lose, MD  HYDROcodone-acetaminophen (NORCO/VICODIN) 5-325 MG tablet Take 1 tablet by mouth every 6 (six) hours as needed for moderate pain. 09/09/18   Fulp, Cammie, MD  LACTOBACILLUS PO Take 1 tablet by mouth daily.    [provider]  lisinopril (PRINIVIL,ZESTRIL) 40 MG tablet Take 40 mg by mouth daily.    [provider]  metoprolol tartrate (LOPRESSOR) 100 MG tablet Take 100 mg by mouth 2 (two) times daily.     [provider]  Multiple Vitamins-Minerals (MULTIVITAMIN WITH MINERALS) tablet Take 1 tablet by mouth daily.    [provider]  omeprazole (PRILOSEC) 40 MG capsule Take 40 mg by mouth daily.     [provider]  prazosin (MINIPRESS) 1 MG capsule Take 2 mg by mouth at bedtime.     [provider]  tamsulosin (FLOMAX) 0.4 MG CAPS capsule Take 0.4 mg by mouth daily as needed (frequent urination).    [provider]     Allergies:    No Known Allergies   Physical Exam:   Vitals  Blood pressure 106/71, pulse 91, temperature 97.9 F (36.6 C),  temperature source Oral, resp. rate 18, SpO2 100 %.  1.  General: Appears in no acute distress  2. Psychiatric: Alert, oriented x3, intact insight and judgment  3. Neurologic: Cranial nerve II through XII grossly intact, motor strength 5/5 in upper and lower extremities  4. HEENMT:  Atraumatic normocephalic, extraocular muscles intact.  5. Respiratory : Clear to auscultation bilaterally, no wheezing or crackles.  6. Cardiovascular : S1-S2, regular, no murmur auscultated.  7. Gastrointestinal:  Abdomen is soft, nontender, no organomegaly.  Colostomy in place  8. Skin:  Absence of part of skin  over penile shaft.  Dressing in place on scrotum      Data Review:    CBC Recent Labs  Lab 09/09/18 1122 09/12/18 1816 09/16/18 0310  WBC 11.3* 10.0 12.2*  HGB 9.5* 8.7* 7.2*  HCT 30.2* 29.1* 24.8*  PLT 383 325 227  MCV 91 95.7 96.1  MCH 28.7 28.6 27.9  MCHC 31.5 29.9* 29.0*  RDW 15.1 16.0* 16.0*  LYMPHSABS 3.1 2.8 2.4  MONOABS  --  1.1* 1.0  EOSABS 0.1 0.2 0.2  BASOSABS 0.1 0.1 0.0   ------------------------------------------------------------------------------------------------------------------  Results for orders placed or performed during the hospital encounter of 09/16/18 (from the past 48 hour(s))  Basic metabolic panel     Status: Abnormal   Collection Time: 09/16/18  3:10 AM  Result Value Ref Range   Sodium 140 135 - 145 mmol/L   Potassium 4.1 3.5 - 5.1 mmol/L   Chloride 114 (H) 98 - 111 mmol/L   CO2 20 (L) 22 - 32 mmol/L   Glucose, Bld 161 (H) 70 - 99 mg/dL   BUN 14 6 - 20 mg/dL   Creatinine, Ser 1.10 0.61 - 1.24 mg/dL   Calcium 8.1 (L) 8.9 - 10.3 mg/dL   GFR calc non Af Amer >60 >60 mL/min   GFR calc Af Amer >60 >60 mL/min   Anion gap 6 5 - 15    Comment: Performed at [facility], [address], [place], [place] [address]  CBC with Differential     Status: Abnormal   Collection Time: 09/16/18  3:10 AM  Result Value Ref Range   WBC 12.2 (H) 4.0 -  10.5 K/uL   RBC 2.58 (L) 4.22 - 5.81 MIL/uL   Hemoglobin 7.2 (L) 13.0 - 17.0 g/dL   HCT 24.8 (L) 39.0 - 52.0 %   MCV 96.1 80.0 - 100.0 fL   MCH 27.9 26.0 - 34.0 pg   MCHC 29.0 (L) 30.0 - 36.0 g/dL   RDW 16.0 (H) 11.5 - 15.5 %   Platelets 227 150 - 400 K/uL   nRBC 0.0 0.0 - 0.2 %   Neutrophils Relative % 69 %   Neutro Abs 8.5 (H) 1.7 - 7.7 K/uL   Lymphocytes Relative 20 %   Lymphs Abs 2.4 0.7 - 4.0 K/uL   Monocytes Relative 9 %   Monocytes Absolute 1.0 0.1 - 1.0 K/uL   Eosinophils Relative 2 %   Eosinophils Absolute 0.2 0.0 - 0.5 K/uL   Basophils Relative 0 %   Basophils Absolute 0.0 0.0 - 0.1 K/uL   Immature Granulocytes 0 %   Abs Immature Granulocytes 0.05 0.00 - 0.07 K/uL    Comment: Performed at [facility], [address] [place], [place] [address]  Prepare RBC     Status: None   Collection Time: 09/16/18  4:08 AM  Result Value Ref Range   Order Confirmation      ORDER PROCESSED BY BLOOD BANK Performed at [facility], [address], [place], [place] [address]   Type and screen [facility]     Status: None (Preliminary result)   Collection Time: 09/16/18  4:08 AM  Result Value Ref Range   ABO/RH(D) O POS    Antibody Screen NEG    Sample Expiration 09/19/2018    Unit Number [id number]    Blood Component Type RED CELLS,LR    Unit division 00  Status of Unit      ALLOCATED Performed at [facility], [address], [place], [place] [address]    Transfusion Status PENDING    Crossmatch Result PENDING     Chemistries  Recent Labs  Lab 09/09/18 1122 09/12/18 1816 09/16/18 0310  NA 140 140 140  K 4.6 4.0 4.1  CL 111* 111 114*  CO2 19* 20* 20*  GLUCOSE 100* 95 161*  BUN 12 10 14   CREATININE 1.06 1.13 1.10  CALCIUM 9.4 8.8* 8.1*  AST 35  --   --   ALT 67*  --   --   ALKPHOS 108  --   --   BILITOT 0.7  --   --     ------------------------------------------------------------------------------------------------------------------  ------------------------------------------------------------------------------------------------------------------ GFR: Estimated Creatinine Clearance: 79.4 mL/min (by C-G formula based on SCr of 1.1 mg/dL). Liver Function Tests: Recent Labs  Lab 09/09/18 1122  AST 35  ALT 67*  ALKPHOS 108  BILITOT 0.7  PROT 7.4  ALBUMIN 3.4*   No results for input(s): LIPASE, AMYLASE in the last 168 hours. No results for input(s): AMMONIA in the last 168 hours. Coagulation Profile: No results for input(s): INR, PROTIME in the last 168 hours. Cardiac Enzymes: No results for input(s): CKTOTAL, CKMB, CKMBINDEX, TROPONINI in the last 168 hours. BNP (last 3 results) No results for input(s): PROBNP in the last 8760 hours. HbA1C: No results for input(s): HGBA1C in the last 72 hours. CBG: No results for input(s): GLUCAP in the last 168 hours. Lipid Profile: No results for input(s): CHOL, HDL, LDLCALC, TRIG, CHOLHDL, LDLDIRECT in the last 72 hours. Thyroid Function Tests: No results for input(s): TSH, T4TOTAL, FREET4, T3FREE, THYROIDAB in the last 72 hours. Anemia Panel: No results for input(s): VITAMINB12, FOLATE, FERRITIN, TIBC, IRON, RETICCTPCT in the last 72 hours.  --------------------------------------------------------------------------------------------------------------- Urine analysis:    Component Value Date/Time   COLORURINE AMBER (A) 08/13/2018 2010   APPEARANCEUR CLOUDY (A) 08/13/2018 2010   LABSPEC 1.014 08/13/2018 2010   PHURINE 5.0 08/13/2018 2010   [place] 08/13/2018 2010   HGBUR NEGATIVE 08/13/2018 2010   BILIRUBINUR negative 09/09/2018 1136   KETONESUR negative 09/09/2018 [place] 08/13/2018 2010   PROTEINUR 30 (A) 08/13/2018 2010   UROBILINOGEN 0.2 09/09/2018 1136   NITRITE Negative 09/09/2018 1136   NITRITE NEGATIVE  08/13/2018 2010   LEUKOCYTESUR Negative 09/09/2018 1136      Imaging Results:      Assessment & Plan:    Active Problems:   Syncope    Acute blood loss anemia      1. Acute blood loss anemia-Syncope patient's hemoglobin on 09/12/2018 was 8.7, it has dropped to 7.2.  Likely from blood loss from the scrotal/penile wound.  Will transfuse 1 unit PRBC.  Follow CBC in a.m.  2. Syncope-patient had brief episode of syncope in the ED, while he was having blood draw.  Likely vasovagal.  Monitor closely on telemetry.  Will obtain echocardiogram.  Cycle troponin every 6 hours x3.  3. Penile/scrotal wound-patient will be transferred to [facility] long hospital for further evaluation by urology.  ED physician discussed with urologist on-call Dr. Junious Silk who recommended transfer to [facility] long.  Patient recently completed 10 days of Augmentin.  4. Hypertension-blood pressure is low normal, will hold lisinopril, continue metoprolol, prazosin  5. Dyslipidemia-continue statin  6. BPH-continue Flomax  7. Obstructive sleep apnea-continue CPAP nightly.   DVT Prophylaxis-   SCDs   AM Labs Ordered, also please review Full Orders  Family Communication: Admission, patients condition and plan of care including tests being ordered have been discussed with the patient and his wife at bedside who indicate understanding and agree with the plan and Code Status.  Code Status: Full code  Admission status: Inpatient: Based on patients clinical presentation and evaluation of above clinical data, I have made determination that patient meets Inpatient criteria at this time.  Time spent in minutes : 60 minutes   Oswald Hillock M.D on 09/16/2018 at 4:57 AM

## 2018-09-16 NOTE — Care Management Note (Addendum)
Case Management Note  Patient Details  Name: THADIUS SMISEK MRN: [id number] Date of Birth: 1960-12-14  Subjective/Objective:                    Action/Plan:Pt is active with [place] [facility] and will continue at discharge. [facility]; [facility] is Dr. Carie Caddy at [facility], fax [phone], Radene Journey, [place] pager, office [phone] ext [phone]. [facility].    Expected Discharge Date:                  Expected Discharge Plan:  [place]  In-House Referral:     Discharge planning Services  CM Consult  Post Acute Care Choice:    Choice offered to:  Patient  DME Arranged:    DME Agency:     HH Arranged:  RN, PT, OT [place] Agency:  [place]  Status of Service:  In process, will continue to follow  If discussed at Long Length of Stay Meetings, dates discussed:    Additional CommentsPurcell Mouton, RN 09/16/2018, 12:52 PM

## 2018-09-16 NOTE — Consult Note (Addendum)
[place] Nurse wound consult note Pt is familiar to [place] team from recent admission, refer to progress notes on 1/24. 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 Reason for Consult: Penis, scrotum, perineum dressing change  Wound type: Post-op full thickness wounds Measurement:  Anterior base of penis: 3X2X2cm, beefy red, mod amt thick tan-red drainage, no odor Anterior penis 5X5X.2cm, beefy red, small amt tan drainage, no odor Posterior scrotum 2X1X.8cm, red and moist, small amt tan drainage, no odor Perineum 1X.5X.8cm, red and moist, small amt tan drainage, no odor Dressing procedure/placement/frequency: Pt tolerated dressing change with minimal amt discomfort. Continue present plan of care with moist gauze packing into each wound cavity as well as over the open wound on the shaft of the penis. Folded ABD pads were then placed over the packing and secured with mesh panties. Instructions and supplies provided for staff nurses to perform the dressing changes daily.  [place] Nurse ostomy follow up Stoma type/location: LLQ colostomy; surgery performed in Jan 2019.  Pt states he and his wife have been performing pouch emptying and changes and denies any problems.  Current pouch is intact with good seal, unable to visualize stoma Output: Mod amt thick pasty dark brown stool  Pt states he uses a one piece convex pouch, barrier ring, and belt.  Supplies left at the bedside for patient and staff nurses use. Please re-consult if further assistance is needed.  Thank-you,  Julien Girt MSN, [place], [place], [place], [place]

## 2018-09-16 NOTE — Progress Notes (Signed)
Patient seen and examined. Admitted after midnight secondary to ongoing bleeding from scrotal/penile wound. Patient also with vasovagal syncope and found with symptomatic anemia. Currently afebrile, hemodynamically stable and in no major distress. Patient denies CP and SOB. Base on discussions with urology service will transfer to [facility] for further evaluation and care. Please refer to H&P written by Dr. Darrick Meigs for further info/details on admission.   Of note, Dr. Junious Silk will see patient in consultation when arrival to [facility].  Barton Dubois MD [phone]

## 2018-09-16 NOTE — ED Notes (Addendum)
Upon arrival, pt had dried blood noted to perineal area anteriorly, scrotum, and catheter tubing, blood clot noted on catheter extending into urethra. #1 incision at the proximal shaft of penis noted to have bloody exudate, incision opening reveals pink wound bed with no signs of infection visible, packing was partially out.  #2 incision on scrotum did not have packing present upon arrival, incision opening reveals pink tissue without exudate and no visible signs of infection. #3 incision between rectum and scrotum did not have packing present upon arrival, no exudate noted, no visible signs of infection. Peri care and foley care performed. Linens and underpad changed. Sterile 4x4 gauze placed over each incision site- will continue to assess for drainage.

## 2018-09-16 NOTE — ED Notes (Signed)
Per lab tech pt had episode of jerking after getting blood from pt. Pt states he got dizzy and felt like he was going to pass out. Pt is alert and oriented at this time and EDP notified.

## 2018-09-16 NOTE — ED Provider Notes (Addendum)
[facility] EMERGENCY DEPARTMENT Provider Note   CSN: [id number] Arrival date & time: 09/16/18  0144     History   Chief Complaint Chief Complaint  Patient presents with  . Coagulation Disorder    HPI Phillip Blackwell is a 58 y.o. male.  Patient is a 58 year old male with past medical history of prostate cancer status post radiation therapy.  He also was recently hospitalized for a perforated rectal ulcer which caused sepsis and infection throughout his pelvis.  This required extensive debriding by urology.  He had debridement of his penis, scrotum, and perianal tissues.  He has a colostomy and an indwelling Foley catheter.  This evening he noticed bleeding while he was attempting to have a bowel movement.  He is uncertain as to where this bleeding was coming from.  He has several surgical incisions and debridement sites throughout his pelvis.  He was found by EMS to be sitting in blood.  Bleeding seems to have resolved prior to transport.  Patient was seen in the ER several days ago at [facility] with similar complaints.  The history is provided by the patient.    Past Medical History:  Diagnosis Date  . Anxiety   . Depression   . GERD (gastroesophageal reflux disease)   . Hypertension   . Rectal bleeding 07/07/2018  . Sleep apnea   . Stroke [facility])    2014    Patient Active Problem List   Diagnosis Date Noted  . Malnutrition of moderate degree 08/19/2018  . Sepsis ([place]) 08/18/2018  . Severe sepsis ([place]) 08/14/2018  . Acute renal failure (ARF) ([place]) 08/14/2018  . Serum total bilirubin elevated 08/14/2018  . Hematochezia 08/14/2018  . Hyponatremia 08/14/2018  . Normal anion gap metabolic acidosis 93/81/8299  . 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 OSA (obstructive sleep apnea) 08/14/2018  . Lower GI bleed 07/07/2018  . HTN (hypertension) 07/07/2018  . HLD (hyperlipidemia) 07/07/2018  . Prostate cancer ([place]) 07/07/2018    Past Surgical History:  Procedure  Laterality Date  . BIOPSY  07/10/2018   Procedure: BIOPSY;  Surgeon: Otis Brace, MD;  Location: [place];  Service: Gastroenterology;;  . Consuela Mimes N/A 08/18/2018   Procedure: CYSTOSCOPY;  Surgeon: Alexis Frock, MD;  Location: [place];  Service: Urology;  Laterality: N/A;  . FLEXIBLE SIGMOIDOSCOPY N/A 07/10/2018   Procedure: FLEXIBLE SIGMOIDOSCOPY;  Surgeon: Otis Brace, MD;  Location: [place];  Service: Gastroenterology;  Laterality: N/A;  Adult EGD Scope. Peds biopsy forceps.  Marland Kitchen HERNIA REPAIR    . IRRIGATION AND DEBRIDEMENT ABSCESS N/A 08/18/2018   Procedure: IRRIGATION AND DEBRIDEMENT penal scrotal ABSCESS;  Surgeon: Alexis Frock, MD;  Location: [place];  Service: Urology;  Laterality: N/A;  . IRRIGATION AND DEBRIDEMENT ABSCESS N/A 09/01/2018   Procedure: IRRIGATION AND DEBRIDEMENT ABSCESS;  Surgeon: Alexis Frock, MD;  Location: [place];  Service: Urology;  Laterality: N/A;  . LAPAROSCOPIC PARTIAL COLECTOMY N/A 08/23/2018   Procedure: LAPAROSCOPIC  END COLOSTOMY CREATION;  Surgeon: Donnie Mesa, MD;  Location: [place];  Service: General;  Laterality: N/A;  . SCROTAL EXPLORATION N/A 08/23/2018   Procedure: SECONDARY PENILE/SCROTUM DEBRIDEMENT;  Surgeon: Alexis Frock, MD;  Location: [place];  Service: Urology;  Laterality: N/A;  . SCROTAL EXPLORATION N/A 08/26/2018   Procedure: THIRD STAGE PENIS AND SCROTAL IRRIGATION AND  DEBRIDEMENT;  Surgeon: Alexis Frock, MD;  Location: [place];  Service: Urology;  Laterality: N/A;        Home Medications    Prior to Admission medications  Medication Sig Start Date End Date Taking? Authorizing Provider  amLODipine (NORVASC) 10 MG tablet Take 10 mg by mouth daily.    [provider]  amoxicillin-clavulanate (AUGMENTIN) 875-125 MG tablet Take 1 tablet by mouth every 12 (twelve) hours. 09/03/18   Thurnell Lose, MD  aspirin 81 MG chewable tablet Chew 81 mg by mouth daily.    [provider]  atorvastatin  (LIPITOR) 40 MG tablet Take 40 mg by mouth daily with supper.     [provider]  chlorproMAZINE (THORAZINE) 25 MG tablet Take 25 mg by mouth at bedtime.    [provider]  Cholecalciferol (VITAMIN D3) 2000 units TABS Take 2,000 Units by mouth daily.     [provider]  clonazePAM (KLONOPIN) 1 MG tablet Take 1 mg by mouth at bedtime.     [provider]  DULoxetine (CYMBALTA) 60 MG capsule Take 60 mg by mouth daily with supper.     [provider]  ferrous sulfate 325 (65 FE) MG tablet Take 1 tablet (325 mg total) by mouth daily with breakfast. 07/11/18   Thurnell Lose, MD  Gauze Pads & Dressings (DRESSING SPONGES) 4"X4" PADS Provide 1 month supply for daily wet and dry dressing - along with sterile Saline bottle. 09/03/18   Thurnell Lose, MD  HYDROcodone-acetaminophen (NORCO/VICODIN) 5-325 MG tablet Take 1 tablet by mouth every 6 (six) hours as needed for moderate pain. 09/09/18   Fulp, Cammie, MD  LACTOBACILLUS PO Take 1 tablet by mouth daily.    [provider]  lisinopril (PRINIVIL,ZESTRIL) 40 MG tablet Take 40 mg by mouth daily.    [provider]  metoprolol tartrate (LOPRESSOR) 100 MG tablet Take 100 mg by mouth 2 (two) times daily.     [provider]  Multiple Vitamins-Minerals (MULTIVITAMIN WITH MINERALS) tablet Take 1 tablet by mouth daily.    [provider]  omeprazole (PRILOSEC) 40 MG capsule Take 40 mg by mouth daily.     [provider]  prazosin (MINIPRESS) 1 MG capsule Take 2 mg by mouth at bedtime.     [provider]  tamsulosin (FLOMAX) 0.4 MG CAPS capsule Take 0.4 mg by mouth daily as needed (frequent urination).    [provider]    Family History Family History  Problem Relation Age of Onset  . Hypertension Mother     Social History Social History   Tobacco Use  . Smoking status: Never Smoker  . Smokeless tobacco: Never Used  Substance Use Topics    . Alcohol use: No  . Drug use: No     Allergies   Patient has no known allergies.   Review of Systems Review of Systems  All other systems reviewed and are negative.    Physical Exam Updated Vital Signs BP 133/86 (BP Location: Right Arm)   Pulse 84   Temp 97.9 F (36.6 C) (Oral)   Resp (!) 25   SpO2 100%   Physical Exam Vitals signs and nursing note reviewed.  Constitutional:      General: He is not in acute distress.    Appearance: He is well-developed. He is not diaphoretic.  HENT:     Head: Normocephalic and atraumatic.  Neck:     Musculoskeletal: Normal range of motion and neck supple.  Cardiovascular:     Rate and Rhythm: Normal rate and regular rhythm.     Heart sounds: No murmur. No friction rub.  Pulmonary:  Effort: Pulmonary effort is normal. No respiratory distress.     Breath sounds: Normal breath sounds. No wheezing or rales.  Abdominal:     General: Bowel sounds are normal. There is no distension.     Palpations: Abdomen is soft.     Tenderness: There is no abdominal tenderness.  Genitourinary:    Comments: Patient's genitalia is status post debridement.  He is missing tissue to the shaft of his penis and there are incisions noted to the testicles and area between his testicles and rectum.  He has a colostomy and indwelling Foley catheter.  There is no active bleeding from any of these places with the exception of a large clot alongside the Foley catheter where it enters the penis. Musculoskeletal: Normal range of motion.  Skin:    General: Skin is warm and dry.  Neurological:     Mental Status: He is alert and oriented to person, place, and time.     Coordination: Coordination normal.      ED Treatments / Results  Labs (all labs ordered are listed, but only abnormal results are displayed) Labs Reviewed  BASIC METABOLIC PANEL  CBC WITH DIFFERENTIAL/PLATELET    EKG None  Radiology No results found.  Procedures Procedures  (including critical care time)  Medications Ordered in ED Medications - No data to display   Initial Impression / Assessment and Plan / ED Course  I have reviewed the triage vital signs and the nursing notes.  Pertinent labs & imaging results that were available during my care of the patient were reviewed by me and considered in my medical decision making (see chart for details).  Patient with complicated past medical history as described in the HPI.  He presents with bleeding from a pelvic debridement site.  His hemoglobin today is 7.2, down from 8.7.  I suspect this number will continue to decline.  After discussion with Dr. Junious Silk, he will be transfused 1 unit of packed red cells.  Patient also will be to [facility] long for observation, transfusion, and possible urology consultation if bleeding persists.  CRITICAL CARE Performed by: Veryl Speak Total critical care time: 45 minutes Critical care time was exclusive of separately billable procedures and treating other patients. Critical care was necessary to treat or prevent imminent or life-threatening deterioration. Critical care was time spent personally by me on the following activities: development of treatment plan with patient and/or surrogate as well as nursing, discussions with consultants, evaluation of patient's response to treatment, examination of patient, obtaining history from patient or surrogate, ordering and performing treatments and interventions, ordering and review of laboratory studies, ordering and review of radiographic studies, pulse oximetry and re-evaluation of patient's condition.   Final Clinical Impressions(s) / ED Diagnoses   Final diagnoses:  None    ED Discharge Orders    None       Veryl Speak, MD 09/16/18 4098    Veryl Speak, MD 09/30/18 [phone]

## 2018-09-16 NOTE — Consult Note (Signed)
Urology Consult   Physician requesting consult: Eleonore Chiquito  Reason for consult: Multiple penoscrotal wounds   History of Present Illness: Phillip Blackwell is a 58 y.o. male who presented to the ED last night with c/o excessive bleeding from his penoscrotal wounds. While in the ED he had a syncopal episode.  Hemoglobin in ED 7.2.  He was admitted for observation and blood transfusion.  Hemoglobin post transfusion is 8.    Pt states he had excessive bleeding from the dorsal penile wound last Sunday that resolved with pressure.  Last night he got up to empty his colostomy bag and noted blood all over his pajama pants and bed.  He and is wife could not tell where the blood was coming from but held pressure over the dorsal wound again as that is where his bleeding was previously.  They called EMS and the paramedic found the bleeding was coming from the perineal wound.  His packing had fallen out and the paramedic applied pressure to the wound.  Pressure was maintained during transport to the ED and by the time he arrived the bleeding had stopped per pt report.   He also had an issue last week with urine leaking from the perineal wound.  He was evaled in the office last Friday by Dr. Alyson Ingles.  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  Pt states urine leakage from perineal wound improved after the foley was placed.   He states nothing about his wound care or daily activities had changed over the past few days.  The only difference is that a new wound care RN changed his packing yesterday instead of his wife. He states that the wound care was a little more painful than previous changes.  He is currently resting comfortably and feels better after the blood transfusion.  He denies F/C, HA, CP, SOB, N/V, and abdominal pain.    Wound HX: Sepsis of GI/GU Origin with Prostatitis / Epididymitis / Colitis/ Severe Penoscrotal Infection - lactate 3.8, WBC 42k by ET labs 08/14/18 on eval  fevers and malaise. UA unremarkable, UCX negative. [place] 1/3 [place] Scrotal US and Pelvic CT with inflmmation of epidydimis / prostate / peri-rectal gas c/w likely infection stemming from colon microperf seeding prostate and GU tract. Initially had good systemic response to IV ABX but worsened penoscrotal skin swelling and then some purlulent drainage 1/7, Repeat CT 1/7 with some progression of peri-rectal gas and new displacement of prior fiducial marker (confirming hole in rectum) and still no large fluid collections / sub-Q gas and exam with new draining sinus tracts at penoscrotal junction. 1st stage OR penoscrotal wound debridment 1/8, 3 large penrose drains place, approx 5cm2 non-viable skin removed. New Wound CX 1/8 - skin flora. +MRSA screen. Repeat debridement 1/13 and additional 4cm2 penile shaft tissue removed, drains replaced, end colostomy by Dr. Gershon Crane. Repeat debridement 1/16, no further tissue removed. 4th debridement in OR 1/22 and wound converted to wet to dry over 3 sites.   Rectal Ulcer/Suspect Perforation- known DX rectal ulcer with periodic bleeding following prostate radiation 2018. Flex-Sig / BX 06/2018 of ulcer edges negative for malignancy. CT 08/14/18 with gas in peri-rectal / peri-prostatic space c/w likely microperforation. Lap end colostomy performed by gen surg 1/13.     Past Medical History:  Diagnosis Date  . Anxiety   . Depression   . GERD (gastroesophageal reflux disease)   . Hypertension   . Rectal bleeding 07/07/2018  . Sleep apnea   .  Stroke [facility])    2014  Prostate cancer s/p EBRT 2018  Past Surgical History:  Procedure Laterality Date  . BIOPSY  07/10/2018   Procedure: BIOPSY;  Surgeon: Otis Brace, MD;  Location: [place];  Service: Gastroenterology;;  . Consuela Mimes N/A 08/18/2018   Procedure: CYSTOSCOPY;  Surgeon: Alexis Frock, MD;  Location: [place];  Service: Urology;  Laterality: N/A;  . FLEXIBLE SIGMOIDOSCOPY N/A 07/10/2018   Procedure:  FLEXIBLE SIGMOIDOSCOPY;  Surgeon: Otis Brace, MD;  Location: [place];  Service: Gastroenterology;  Laterality: N/A;  Adult EGD Scope. Peds biopsy forceps.  Marland Kitchen HERNIA REPAIR    . IRRIGATION AND DEBRIDEMENT ABSCESS N/A 08/18/2018   Procedure: IRRIGATION AND DEBRIDEMENT penal scrotal ABSCESS;  Surgeon: Alexis Frock, MD;  Location: [place];  Service: Urology;  Laterality: N/A;  . IRRIGATION AND DEBRIDEMENT ABSCESS N/A 09/01/2018   Procedure: IRRIGATION AND DEBRIDEMENT ABSCESS;  Surgeon: Alexis Frock, MD;  Location: [place];  Service: Urology;  Laterality: N/A;  . LAPAROSCOPIC PARTIAL COLECTOMY N/A 08/23/2018   Procedure: LAPAROSCOPIC  END COLOSTOMY CREATION;  Surgeon: Donnie Mesa, MD;  Location: [place];  Service: General;  Laterality: N/A;  . SCROTAL EXPLORATION N/A 08/23/2018   Procedure: SECONDARY PENILE/SCROTUM DEBRIDEMENT;  Surgeon: Alexis Frock, MD;  Location: [place];  Service: Urology;  Laterality: N/A;  . SCROTAL EXPLORATION N/A 08/26/2018   Procedure: THIRD STAGE PENIS AND SCROTAL IRRIGATION AND  DEBRIDEMENT;  Surgeon: Alexis Frock, MD;  Location: [place];  Service: Urology;  Laterality: N/A;    Current Hospital Medications:  Home Meds:  . Current Meds  Medication Sig  . amLODipine (NORVASC) 10 MG tablet Take 10 mg by mouth daily.  Marland Kitchen aspirin 81 MG chewable tablet Chew 81 mg by mouth daily.  Marland Kitchen atorvastatin (LIPITOR) 40 MG tablet Take 40 mg by mouth daily with supper.   . chlorproMAZINE (THORAZINE) 25 MG tablet Take 25 mg by mouth at bedtime.  . Cholecalciferol (VITAMIN D3) 2000 units TABS Take 2,000 Units by mouth daily.   . clonazePAM (KLONOPIN) 1 MG tablet Take 1 mg by mouth at bedtime.   . DULoxetine (CYMBALTA) 60 MG capsule Take 60 mg by mouth daily with supper.   . ferrous sulfate 325 (65 FE) MG tablet Take 1 tablet (325 mg total) by mouth daily with breakfast.  . Gauze Pads & Dressings (DRESSING SPONGES) 4"X4" PADS Provide 1 month supply for daily wet and dry dressing  - along with sterile Saline bottle.  Marland Kitchen LACTOBACILLUS PO Take 1 tablet by mouth daily.  Marland Kitchen lisinopril (PRINIVIL,ZESTRIL) 40 MG tablet Take 40 mg by mouth daily.  . metoprolol tartrate (LOPRESSOR) 100 MG tablet Take 100 mg by mouth daily.   . Multiple Vitamins-Minerals (MULTIVITAMIN WITH MINERALS) tablet Take 1 tablet by mouth daily.  Marland Kitchen omeprazole (PRILOSEC) 40 MG capsule Take 40 mg by mouth daily.   . polycarbophil (FIBERCON) 625 MG tablet Take 625 mg by mouth daily.  . prazosin (MINIPRESS) 1 MG capsule Take 2 mg by mouth at bedtime.   . tamsulosin (FLOMAX) 0.4 MG CAPS capsule Take 0.4 mg by mouth daily as needed (frequent urination).     Scheduled Meds: . amLODipine  10 mg Oral Daily  . atorvastatin  40 mg Oral Q supper  . chlorproMAZINE  25 mg Oral QHS  . clonazePAM  1 mg Oral QHS  . DULoxetine  60 mg Oral Q supper  . [START ON 09/17/2018] ferrous sulfate  325 mg Oral Q breakfast  . metoprolol tartrate  100  mg Oral BID  . pantoprazole  40 mg Oral Daily  . prazosin  2 mg Oral QHS   Continuous Infusions: . sodium chloride 10 mL/hr at 09/16/18 1043   PRN Meds:.HYDROcodone-acetaminophen, morphine injection, ondansetron **OR** ondansetron (ZOFRAN) IV, oxyCODONE  Allergies: No Known Allergies  Family History  Problem Relation Age of Onset  . Hypertension Mother     Social History:  reports that he has never smoked. He has never used smokeless tobacco. He reports that he does not drink alcohol or use drugs.  ROS: A complete review of systems was performed.  All systems are negative except for pertinent findings as noted.  Physical Exam:  Vital signs in last 24 hours: Temp:  [97.9 F (36.6 C)-99.5 F (37.5 C)] 97.9 F (36.6 C) (02/06 1445) Pulse Rate:  [76-94] 80 (02/06 1445) Resp:  [12-25] 18 (02/06 1445) BP: (106-139)/(69-86) 133/71 (02/06 1445) SpO2:  [97 %-100 %] 100 % (02/06 1445) Weight:  [81.6 kg] 81.6 kg (02/06 0936) Constitutional:  Alert and oriented, No acute  distress Cardiovascular: Regular rate and rhythm Respiratory: Normal respiratory effort GI: Abdomen is soft, nontender, nondistended, colostomy bag in place GU: dorsal and ventral penile wounds clean with pink base and scant granulation tissue present.  Packing in place at anterior base of penile wound is clean.  Perineal wound packing in place and clean.  Minimal serosanguinous drainage on ABD pad over wounds. Foley in place with bloody urine in bag.  Lymphatic: No lymphadenopathy Neurologic: Grossly intact, no focal deficits Psychiatric: Normal mood and affect  Laboratory Data:  Recent Labs    09/16/18 0310 09/16/18 1012  WBC 12.2* 11.0*  HGB 7.2* 8.0*  HCT 24.8* 29.0*  PLT 227 211    Recent Labs    09/16/18 0310 09/16/18 1012  NA 140 139  K 4.1 4.0  CL 114* 113*  GLUCOSE 161* 115*  BUN 14 13  CALCIUM 8.1* 8.2*  CREATININE 1.10 0.92     Results for orders placed or performed during the hospital encounter of 09/16/18 (from the past 24 hour(s))  Basic metabolic panel     Status: Abnormal   Collection Time: 09/16/18  3:10 AM  Result Value Ref Range   Sodium 140 135 - 145 mmol/L   Potassium 4.1 3.5 - 5.1 mmol/L   Chloride 114 (H) 98 - 111 mmol/L   CO2 20 (L) 22 - 32 mmol/L   Glucose, Bld 161 (H) 70 - 99 mg/dL   BUN 14 6 - 20 mg/dL   Creatinine, Ser 1.10 0.61 - 1.24 mg/dL   Calcium 8.1 (L) 8.9 - 10.3 mg/dL   GFR calc non Af Amer >60 >60 mL/min   GFR calc Af Amer >60 >60 mL/min   Anion gap 6 5 - 15  CBC with Differential     Status: Abnormal   Collection Time: 09/16/18  3:10 AM  Result Value Ref Range   WBC 12.2 (H) 4.0 - 10.5 K/uL   RBC 2.58 (L) 4.22 - 5.81 MIL/uL   Hemoglobin 7.2 (L) 13.0 - 17.0 g/dL   HCT 24.8 (L) 39.0 - 52.0 %   MCV 96.1 80.0 - 100.0 fL   MCH 27.9 26.0 - 34.0 pg   MCHC 29.0 (L) 30.0 - 36.0 g/dL   RDW 16.0 (H) 11.5 - 15.5 %   Platelets 227 150 - 400 K/uL   nRBC 0.0 0.0 - 0.2 %   Neutrophils Relative % 69 %   Neutro Abs 8.5 (H) 1.7 -  7.7  K/uL   Lymphocytes Relative 20 %   Lymphs Abs 2.4 0.7 - 4.0 K/uL   Monocytes Relative 9 %   Monocytes Absolute 1.0 0.1 - 1.0 K/uL   Eosinophils Relative 2 %   Eosinophils Absolute 0.2 0.0 - 0.5 K/uL   Basophils Relative 0 %   Basophils Absolute 0.0 0.0 - 0.1 K/uL   Immature Granulocytes 0 %   Abs Immature Granulocytes 0.05 0.00 - 0.07 K/uL  Prepare RBC     Status: None   Collection Time: 09/16/18  4:08 AM  Result Value Ref Range   Order Confirmation      ORDER PROCESSED BY BLOOD BANK Performed at [facility], [address], [place], [place] [address]   Type and screen [facility]     Status: None (Preliminary result)   Collection Time: 09/16/18  4:08 AM  Result Value Ref Range   ABO/RH(D) O POS    Antibody Screen NEG    Sample Expiration 09/19/2018    Unit Number [id number]    Blood Component Type RED CELLS,LR    Unit division 00    Status of Unit ISSUED    Transfusion Status OK TO TRANSFUSE    Crossmatch Result      Compatible Performed at [facility], [address], [place], [place] [address]   ABO/Rh     Status: None   Collection Time: 09/16/18  4:08 AM  Result Value Ref Range   ABO/RH(D)      O POS Performed at [facility], [address], [place], [place] [address]   CBC     Status: Abnormal   Collection Time: 09/16/18 10:12 AM  Result Value Ref Range   WBC 11.0 (H) 4.0 - 10.5 K/uL   RBC 2.87 (L) 4.22 - 5.81 MIL/uL   Hemoglobin 8.0 (L) 13.0 - 17.0 g/dL   HCT 29.0 (L) 39.0 - 52.0 %   MCV 101.0 (H) 80.0 - 100.0 fL   MCH 27.9 26.0 - 34.0 pg   MCHC 27.6 (L) 30.0 - 36.0 g/dL   RDW 17.1 (H) 11.5 - 15.5 %   Platelets 211 150 - 400 K/uL   nRBC 0.0 0.0 - 0.2 %  Comprehensive metabolic panel     Status: Abnormal   Collection Time: 09/16/18 10:12 AM  Result Value Ref Range   Sodium 139 135 - 145 mmol/L   Potassium 4.0 3.5 - 5.1 mmol/L   Chloride 113 (H) 98 - 111 mmol/L   CO2 17 (L) 22 - 32 mmol/L   Glucose, Bld 115 (H) 70 - 99 mg/dL   BUN 13 6 - 20  mg/dL   Creatinine, Ser 0.92 0.61 - 1.24 mg/dL   Calcium 8.2 (L) 8.9 - 10.3 mg/dL   Total Protein 6.0 (L) 6.5 - 8.1 g/dL   Albumin 2.3 (L) 3.5 - 5.0 g/dL   AST 20 15 - 41 U/L   ALT 22 0 - 44 U/L   Alkaline Phosphatase 63 38 - 126 U/L   Total Bilirubin 0.9 0.3 - 1.2 mg/dL   GFR calc non Af Amer >60 >60 mL/min   GFR calc Af Amer >60 >60 mL/min   Anion gap 9 5 - 15  Troponin I - Now Then Q6H     Status: None   Collection Time: 09/16/18 12:05 PM  Result Value Ref Range   Troponin I <0.03 <0.03 ng/mL  BLOOD TRANSFUSION REPORT - SCANNED     Status: None   Collection  Time: 09/16/18 12:23 PM   Narrative   Ordered by an unspecified provider.   Recent Results (from the past 240 hour(s))  Urine Culture     Status: None   Collection Time: 09/09/18 11:43 AM  Result Value Ref Range Status   Urine Culture, Routine Final report  Final   Organism ID, Bacteria Comment  Final    Comment: Mixed urogenital flora Less than 10,000 colonies/mL     Renal Function: Recent Labs    09/12/18 1816 09/16/18 0310 09/16/18 1012  CREATININE 1.13 1.10 0.92   Estimated Creatinine Clearance: 99.8 mL/min (by C-G formula based on SCr of 0.92 mg/dL).  Radiologic Imaging: No results found.   Impression/Recommendation  Penoscrotal wounds are healing well.  Unclear what caused excessive bleeding.  It is likely that pt still has some urine leakage through the urothrocutaneous fistula into the perineal wound.  It would be difficult to tell the difference between blood and urine through the wound and is possible he was seeing both last night. He also has bloody urine in the foley bag but it is draining well.  Continue foley and wound care.  Dispo per IM when pt is stable from their standpoint.   Debbrah Alar 09/16/2018, 3:40 PM

## 2018-09-16 NOTE — ED Notes (Signed)
This nurse entered pt room as requested. Pt told me "something came out of rectum, wanted to let you know I caught it with the towel you gave me". Informed pt that I would be back to assess after phlebotomist left room. Pt okay with this plan.

## 2018-09-16 NOTE — Progress Notes (Signed)
Phillip Blackwell  is a 58 y.o. male, with history of hypertension, BPH, obstructive sleep apnea, prostate cancer status post external beam radiation complicated by rectal ulcer with perforation with seeding of GU tract causing epididymitis/prostatitis and penile scrotal abscess along with eggerthella lenta bacteremia, treated with Augmentin.  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  Today patient came to hospital after he noticed excessive bleeding from the scrotal/penile wound at home.  Incidentally in the ED patient also had a brief syncopal episode while he was having a blood draw for labs.  In the ED lab work showed hemoglobin of 7.2. Urology was consulted by ED physician and they recommended patient to be transferred to [facility] where he can be evaluated.  09/16/2018: Patient seen and examined at his bedside at [facility] long from any pain.  Pain is well controlled on current pain management.  We will continue to closely monitor  Please refer to H&P dictated by Dr. Darrick Meigs on 09/16/2018 for further details of the assessment and plan.

## 2018-09-16 NOTE — ED Notes (Signed)
Report given to [facility] with [facility] at this time. ETA 4min.

## 2018-09-16 NOTE — ED Triage Notes (Signed)
Pt had suprapubic catheter placed around 2 weeks. Pt called EMS tonight for bleeding around suprapubic catheter insertion site. EMS estimates around 100-278ml of blood on the ground around pt upon arrival to pts home.

## 2018-09-17 DIAGNOSIS — R55 Syncope and collapse: Secondary | ICD-10-CM

## 2018-09-17 LAB — BASIC METABOLIC PANEL
Anion gap: 4 — ABNORMAL LOW (ref 5–15)
BUN: 13 mg/dL (ref 6–20)
CALCIUM: 8.2 mg/dL — AB (ref 8.9–10.3)
CO2: 21 mmol/L — ABNORMAL LOW (ref 22–32)
Chloride: 113 mmol/L — ABNORMAL HIGH (ref 98–111)
Creatinine, Ser: 1.01 mg/dL (ref 0.61–1.24)
GFR calc non Af Amer: >60 mL/min (ref >60)
Glucose, Bld: 111 mg/dL — ABNORMAL HIGH (ref 70–99)
Potassium: 3.8 mmol/L (ref 3.5–5.1)
Sodium: 138 mmol/L (ref 135–145)

## 2018-09-17 LAB — CBC
HCT: 22.7 % — ABNORMAL LOW (ref 39.0–52.0)
HEMOGLOBIN: 6.6 g/dL — AB (ref 13.0–17.0)
MCH: 27.8 pg (ref 26.0–34.0)
MCHC: 29.1 g/dL — ABNORMAL LOW (ref 30.0–36.0)
MCV: 95.8 fL (ref 80.0–100.0)
Platelets: 221 10*3/uL (ref 150–400)
RBC: 2.37 MIL/uL — ABNORMAL LOW (ref 4.22–5.81)
RDW: 17.1 % — ABNORMAL HIGH (ref 11.5–15.5)
WBC: 6.6 10*3/uL (ref 4.0–10.5)
nRBC: 0 % (ref 0.0–0.2)

## 2018-09-17 LAB — TYPE AND SCREEN
ABO/RH(D): O POS
Antibody Screen: NEGATIVE
Unit division: 0

## 2018-09-17 LAB — BPAM RBC
BLOOD PRODUCT EXPIRATION DATE: 202003032359
ISSUE DATE / TIME: 202002060534
Unit Type and Rh: 5100

## 2018-09-17 LAB — PREPARE RBC (CROSSMATCH)

## 2018-09-17 LAB — HEMOGLOBIN AND HEMATOCRIT, BLOOD
HCT: 27.6 % — ABNORMAL LOW (ref 39.0–52.0)
Hemoglobin: 8.4 g/dL — ABNORMAL LOW (ref 13.0–17.0)

## 2018-09-17 LAB — ABO/RH: ABO/RH(D): O POS

## 2018-09-17 MED ORDER — SODIUM CHLORIDE 0.9% IV SOLUTION: Freq: Once | INTRAVENOUS | Status: AC

## 2018-09-17 MED ORDER — SODIUM CHLORIDE 0.9% IV SOLUTION
Freq: Once | INTRAVENOUS | Status: AC
  Administered 2018-09-17: 13:00:00 via INTRAVENOUS

## 2018-09-17 MED ORDER — SODIUM CHLORIDE 0.9% IV SOLUTION
Freq: Once | INTRAVENOUS | Status: AC
  Administered 2018-09-17: 07:00:00 via INTRAVENOUS

## 2018-09-17 NOTE — Progress Notes (Signed)
PROGRESS NOTE  Phillip Blackwell DXI:[id number] DOB: Nov 07, 1960 DOA: 09/16/2018 PCP: [facility]  HPI/Recap of past 24 hours: Phillip Blackwell a58 y.o.male,with history of hypertension, BPH, obstructive sleep apnea, prostate cancer status post external beam radiation complicated by rectal ulcer with perforation with seeding of GU tract causing epididymitis/prostatitis and penile scrotal abscess along with eggerthella lentabacteremia,treated with Augmentin.Patient has a diverting colostomy in place with chronic indwelling Foley catheter.  Presented to the ED after he noticed excessive bleeding from the scrotal/penile wound at home. Incidentally in the ED patient also had a brief syncopal episode while he was having a blood draw for labs. In the ED lab work showed hemoglobin of 7.2. Urology was consulted by ED physician and they recommended patient to be transferred to [facility] for further evaluation.  09/17/2018: Patient seen and examined at his bedside.  No acute events overnight.  This morning hemoglobin dropped to 6.6 from 7.2.  Will transfuse 2 unit PRBCs.  His scrotal and perineal pain are well controlled on current pain management.  Patient received home health services once a week and the rest of dressing changes are done by his wife at home.  Assessment/Plan: Active Problems:   Syncope  Syncope likely vasovagal during blood draw No recurrences Negative troponin  Vital signs stable on telemetry  Acute blood loss anemia suspect secondary to blood loss from the scrotal/penile wound Baseline hemoglobin 9.5 Initially transfused 1 unit Hemoglobin dropped again this morning to 6.6 We will transfuse an additional 2 units PRBCs Repeat CBC in the morning Continue to closely monitor for any acute changes  Penile/scrotal wounds Seen by urology and will follow-up outpatient Patient recently completed 10 days of Augmentin Currently afebrile with no leukocytosis Continue dressing  changes as recommended by wound care and urology Will likely need to continue home health services at home for wound care Order face-to-face  Hypertension-blood pressure is at goal.  Blood pressure is low normal, will hold lisinopril to avoid hypotension, continue metoprolol tartrate 100 mg twice daily, prazosin 2 mg nightly  Dyslipidemia-continue statin  BPH-continue Flomax.  Indwelling Foley catheter in place.  Obstructive sleep apnea-continue CPAP nightly.      DVT Prophylaxis-   SCDs   AM Labs Ordered, also please review Full Orders  Family Communication:  None at bedside  Code Status: Full code    Objective: Vitals:   09/17/18 0645 09/17/18 0711 09/17/18 1020 09/17/18 1258  BP: 99/61 (!) 114/57 96/61 131/67  Pulse: 68 69 80 70  Resp: 18 17  14   Temp: 98.1 F (36.7 C) 98.7 F (37.1 C) 97.8 F (36.6 C) 98 F (36.7 C)  TempSrc: Oral Oral Oral Oral  SpO2: 99% 100% 99% 97%  Weight:      Height:        Intake/Output Summary (Last 24 hours) at 09/17/2018 1302 Last data filed at 09/17/2018 1030 Gross per 24 hour  Intake 722.94 ml  Output 750 ml  Net -27.06 ml   Filed Weights   09/16/18 0936  Weight: 81.6 kg    Exam:  . General: 58 y.o. year-old male well developed well nourished in no acute distress.  Alert and oriented x3. . Cardiovascular: Regular rate and rhythm with no rubs or gallops.  No thyromegaly or JVD noted.   Marland Kitchen Respiratory: Clear to auscultation with no wheezes or rales. Good inspiratory effort. . Abdomen: Soft nontender nondistended with normal bowel sounds x4 quadrants. . Musculoskeletal: No lower extremity edema. 2/4 pulses in all 4 extremities. Marland Kitchen  Skin: Multiple lesions affecting penile, scrotal and perineal regions . Psychiatry: Mood is appropriate for condition and setting   Data Reviewed: CBC: Recent Labs  Lab 09/12/18 1816 09/16/18 0310 09/16/18 1012 09/17/18 0401  WBC 10.0 12.2* 11.0* 6.6  NEUTROABS 5.9 8.5*  --   --     HGB 8.7* 7.2* 8.0* 6.6*  HCT 29.1* 24.8* 29.0* 22.7*  MCV 95.7 96.1 101.0* 95.8  PLT 325 227 211 998   Basic Metabolic Panel: Recent Labs  Lab 09/12/18 1816 09/16/18 0310 09/16/18 1012 09/17/18 0401  NA 140 140 139 138  K 4.0 4.1 4.0 3.8  CL 111 114* 113* 113*  CO2 20* 20* 17* 21*  GLUCOSE 95 161* 115* 111*  BUN 10 14 13 13   CREATININE 1.13 1.10 0.92 1.01  CALCIUM 8.8* 8.1* 8.2* 8.2*   GFR: Estimated Creatinine Clearance: 90.9 mL/min (by C-G formula based on SCr of 1.01 mg/dL). Liver Function Tests: Recent Labs  Lab 09/16/18 1012  AST 20  ALT 22  ALKPHOS 63  BILITOT 0.9  PROT 6.0*  ALBUMIN 2.3*   No results for input(s): LIPASE, AMYLASE in the last 168 hours. No results for input(s): AMMONIA in the last 168 hours. Coagulation Profile: No results for input(s): INR, PROTIME in the last 168 hours. Cardiac Enzymes: Recent Labs  Lab 09/16/18 1205 09/16/18 1748 09/16/18 2318  TROPONINI <0.03 <0.03 <0.03   BNP (last 3 results) No results for input(s): PROBNP in the last 8760 hours. HbA1C: No results for input(s): HGBA1C in the last 72 hours. CBG: No results for input(s): GLUCAP in the last 168 hours. Lipid Profile: No results for input(s): CHOL, HDL, LDLCALC, TRIG, CHOLHDL, LDLDIRECT in the last 72 hours. Thyroid Function Tests: No results for input(s): TSH, T4TOTAL, FREET4, T3FREE, THYROIDAB in the last 72 hours. Anemia Panel: No results for input(s): VITAMINB12, FOLATE, FERRITIN, TIBC, IRON, RETICCTPCT in the last 72 hours. Urine analysis:    Component Value Date/Time   COLORURINE AMBER (A) 08/13/2018 2010   APPEARANCEUR CLOUDY (A) 08/13/2018 2010   LABSPEC 1.014 08/13/2018 2010   PHURINE 5.0 08/13/2018 2010   GLUCOSEU NEGATIVE 08/13/2018 2010   HGBUR NEGATIVE 08/13/2018 2010   BILIRUBINUR negative 09/09/2018 1136   KETONESUR negative 09/09/2018 [place] 08/13/2018 2010   PROTEINUR 30 (A) 08/13/2018 2010   UROBILINOGEN 0.2  09/09/2018 1136   NITRITE Negative 09/09/2018 1136   NITRITE NEGATIVE 08/13/2018 2010   LEUKOCYTESUR Negative 09/09/2018 1136   Sepsis Labs: @LABRCNTIP (procalcitonin:4,lacticidven:4)  ) Recent Results (from the past 240 hour(s))  Urine Culture     Status: None   Collection Time: 09/09/18 11:43 AM  Result Value Ref Range Status   Urine Culture, Routine Final report  Final   Organism ID, Bacteria Comment  Final    Comment: Mixed urogenital flora Less than 10,000 colonies/mL       Studies: No results found.  Scheduled Meds: . amLODipine  10 mg Oral Daily  . atorvastatin  40 mg Oral Q supper  . chlorproMAZINE  25 mg Oral QHS  . clonazePAM  1 mg Oral QHS  . DULoxetine  60 mg Oral Q supper  . ferrous sulfate  325 mg Oral Q breakfast  . metoprolol tartrate  100 mg Oral BID  . pantoprazole  40 mg Oral Daily  . prazosin  2 mg Oral QHS    Continuous Infusions: . sodium chloride 10 mL/hr at 09/16/18 1043     LOS: 1 day  Kayleen Memos, MD [facility] Pager [phone]  If 7PM-7AM, please contact night-coverage [email or web address] Password [email or web address] 09/17/2018, 1:02 PM

## 2018-09-17 NOTE — Progress Notes (Signed)
CRITICAL VALUE ALERT  Critical Value: hgb 6.6  Date & Time Notied:  2233 09/17/18  Provider Notified: Sch[place]  Orders Received/Actions taken: New orders placed

## 2018-09-17 NOTE — Plan of Care (Signed)
  Problem: Health Behavior/Discharge Planning: Goal: Ability to manage health-related needs will improve Outcome: Progressing   Problem: Clinical Measurements: Goal: Ability to maintain clinical measurements within normal limits will improve Outcome: Progressing Goal: Will remain free from infection Outcome: Progressing Goal: Diagnostic test results will improve Outcome: Progressing Goal: Respiratory complications will improve Outcome: Progressing Goal: Cardiovascular complication will be avoided Outcome: Progressing   Problem: Activity: Goal: Risk for activity intolerance will decrease Outcome: Progressing   Problem: Nutrition: Goal: Adequate nutrition will be maintained Outcome: Progressing   Problem: Coping: Goal: Level of anxiety will decrease Outcome: Progressing   Problem: Pain Managment: Goal: General experience of comfort will improve Outcome: Progressing   Problem: Safety: Goal: Ability to remain free from injury will improve Outcome: Progressing   Problem: Skin Integrity: Goal: Risk for impaired skin integrity will decrease Outcome: Progressing   

## 2018-09-17 NOTE — Progress Notes (Signed)
Pt refused CPAP-qhs again tonight.  Pt encouraged to contact RT should he change his mind.

## 2018-09-17 NOTE — Progress Notes (Signed)
Subjective/Chief Complaint:  1 - Prostate Cancer, Likely Recurrent- s/p primary external beam radiation completed 03/2017 in [place] for larger volume Gleason 6 disease. Initial PSA 5.5 and diagnosed 2017 by Dr. Alyson Ingles.  Recent PSA Course: 06/2017 - PSA 1.1 06/2018 - PSA 2.6   2 -  Severe Penoscrotal Infection - s/p multistage operative debridement 1/20120 for large penoscrotal wound seeded by colon perforation. Wound transitioned to wet to dry managed by home health and wife dressing changes.   3 - Rectal Ulcer/Suspect Perforation- known DX rectal ulcer with periodic bleeding following prostate radiation 2018. Flex-Sig / BX 06/2018 of ulcer edges negative for malignancy. CT 08/14/18 with gas in peri-rectal / peri-prostatic space c/w likely microperforation. Lap end colostomy performed by gen surg 08/23/18, now working well, but still occasional bloody discharge per rectum c/w chronic ulcer.  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 He has h/o additional severe complications of pelvic radiation as per above. Foley placed with plan for RUB 10/11/18.  5 - Acute on Chronic Anemia  - pt with persistnat occasional bloody discharge from cutaneous pelvic wounds (none deep to fascia) and rectum (known chronic ulcer). Improved after holding ASA proph. Baseline recent Hgb about 9. Drop to 6.6 this admission.   Today " Phillip Blackwell" is stable. Decreased bleeding per rectum and wound today. Really no complaints at present.    Objective: Vital signs in last 24 hours: Temp:  [97.8 F (36.6 C)-98.7 F (37.1 C)] 98 F (36.7 C) (02/07 1258) Pulse Rate:  [66-91] 70 (02/07 1258) Resp:  [14-18] 14 (02/07 1258) BP: (91-133)/(57-71) 131/67 (02/07 1258) SpO2:  [96 %-100 %] 97 % (02/07 1258) Last BM Date: 09/16/18  Intake/Output from previous day: 02/06 0701 - 02/07 0700 In: 676.1 [P.O.:300; I.V.:61.1; Blood:315] Out: 575 [Urine:575] Intake/Output  this shift: Total I/O In: 394 [Blood:394] Out: 300 [Urine:300]  General appearance: alert, cooperative, appears stated age and family at bedside, in good spirits Eyes: negative Nose: Nares normal. Septum midline. Mucosa normal. No drainage or sinus tenderness. Throat: lips, mucosa, and tongue normal; teeth and gums normal Neck: supple, symmetrical, trachea midline Back: symmetric, no curvature. ROM normal. No CVA tenderness. Resp: non-labored on room air.  Cardio: normal rate GI: soft, non-tender; bowel sounds normal; no masses,  no organomegaly and LLQ colosomty patent of stool and gas.  Male genitalia: improved penoscrotal wound wiht granulated base and edges, similar ar scrotum and perinum. No ongoing bleeding at present. No expressable purlulence / crepitus / fluctuence / induration.  Extremities: extremities normal, atraumatic, no cyanosis or edema Lymph nodes: Cervical, supraclavicular, and axillary nodes normal. Neurologic: Grossly normal Incision/Wound: as per above.   Lab Results:  Recent Labs    09/16/18 1012 09/17/18 0401  WBC 11.0* 6.6  HGB 8.0* 6.6*  HCT 29.0* 22.7*  PLT 211 221   BMET Recent Labs    09/16/18 1012 09/17/18 0401  NA 139 138  K 4.0 3.8  CL 113* 113*  CO2 17* 21*  GLUCOSE 115* 111*  BUN 13 13  CREATININE 0.92 1.01  CALCIUM 8.2* 8.2*   PT/INR No results for input(s): LABPROT, INR in the last 72 hours. ABG No results for input(s): PHART, HCO3 in the last 72 hours.  Invalid input(s): PCO2, PO2  Studies/Results: No results found.  Anti-infectives: Anti-infectives (From admission, onward)   None      Assessment/Plan:  1 - Prostate Cancer, Likely Recurrent- will likely need androgen deprivation  eventually and / or pelvic exenteration.   2 -  Severe Penoscrotal Infection - this has thankfully resolved with fecal diversion and continued wound management. Continue wet to dry dressing. Reinforced healing will be a months time scale  and may eventually warrant plastics input / skin grafting after maximally granulated.   3 - Rectal Ulcer/Suspect Perforation- still creating problems via rectal discharge and come bleeding. Discussed with patient today role of eventual pelvic exenteration with APR and cystoprostatectomy. I am concerned that anythign short of that will likely leave him with continued bleeding, discharge.   4 - Urethrocutaneous Fistula - discussed overall low but not zero probability of this healing over foley, again he will likely need eventual urinary diversion. Continue path of retrograde urethrogram later this month and foley for now.   5 - Acute on Chronic Anemia  - likely some anemia of chronic disease exacerbated by some small volume ongoing loss via wound and rectal ulcer. Reinforced importance of high red meat intake for protein and Fe, agree with transfuction for now.   He has Urol follow up arrnaged at end of the month. He is OK for DC from Urol perspective after acute anemia stable, no surgical indications this admission  Please call me directly with questions anytime  Alexis Frock 09/17/2018

## 2018-09-18 LAB — BASIC METABOLIC PANEL
Anion gap: 6 (ref 5–15)
BUN: 10 mg/dL (ref 6–20)
CO2: 24 mmol/L (ref 22–32)
Calcium: 8.2 mg/dL — ABNORMAL LOW (ref 8.9–10.3)
Chloride: 106 mmol/L (ref 98–111)
Creatinine, Ser: 1 mg/dL (ref 0.61–1.24)
Glucose, Bld: 108 mg/dL — ABNORMAL HIGH (ref 70–99)
Potassium: 3.7 mmol/L (ref 3.5–5.1)
SODIUM: 136 mmol/L (ref 135–145)

## 2018-09-18 LAB — BPAM RBC
Blood Product Expiration Date: 202003022359
Blood Product Expiration Date: 202003052359
ISSUE DATE / TIME: 202002070652
ISSUE DATE / TIME: 202002071306
Unit Type and Rh: 5100
Unit Type and Rh: 5100

## 2018-09-18 LAB — TYPE AND SCREEN
ABO/RH(D): O POS
Antibody Screen: NEGATIVE
Unit division: 0
Unit division: 0

## 2018-09-18 LAB — CBC
HCT: 27.5 % — ABNORMAL LOW (ref 39.0–52.0)
Hemoglobin: 8.5 g/dL — ABNORMAL LOW (ref 13.0–17.0)
MCH: 29.1 pg (ref 26.0–34.0)
MCHC: 30.9 g/dL (ref 30.0–36.0)
MCV: 94.2 fL (ref 80.0–100.0)
Platelets: 199 10*3/uL (ref 150–400)
RBC: 2.92 MIL/uL — ABNORMAL LOW (ref 4.22–5.81)
RDW: 16.2 % — ABNORMAL HIGH (ref 11.5–15.5)
WBC: 8.8 10*3/uL (ref 4.0–10.5)
nRBC: 0 % (ref 0.0–0.2)

## 2018-09-18 MED ORDER — OXYCODONE HCL 5 MG PO TABS: 5.0000 mg | ORAL_TABLET | Freq: Three times a day (TID) | ORAL | 0 refills | Status: DC | PRN

## 2018-09-18 NOTE — Progress Notes (Signed)
Contacted [facility] to make aware that pt was dc today with resumption of care. Jonnie Finner RN CCM Case Mgmt phone [phone]

## 2018-09-18 NOTE — Discharge Summary (Signed)
Discharge Summary  Phillip Blackwell QMV:[id number] DOB: 1961-01-09  PCP: [facility]  Admit date: 09/16/2018 Discharge date: 09/18/2018  Time spent: 35 minutes   Recommendations for Outpatient Follow-up:  1. Follow up with urology 2. Follow up with wound care 3. Take your medications as prescribed  Discharge Diagnoses:  Active Hospital Problems   Diagnosis Date Noted  . Syncope 09/16/2018    Resolved Hospital Problems  No resolved problems to display.    Discharge Condition: Stable  Diet recommendation: Resume previous diet   Vitals:   09/18/18 0449 09/18/18 1332  BP: 132/79 99/70  Pulse: 84 77  Resp: 18 16  Temp: 98.8 F (37.1 C) 99.1 F (37.3 C)  SpO2: 96% 100%    History of present illness:  [name] a57 y.o.male,with history of hypertension, BPH, obstructive sleep apnea, prostate cancer status post external beam radiation complicated by rectal ulcer with perforation with seeding of GU tract causing epididymitis/prostatitis and penile scrotal abscess along with eggerthella lentabacteremia,treated with Augmentin.Patient has a diverting colostomy in place with chronic indwelling Foley catheter.  Presented to the ED after he noticed excessive bleeding from the scrotal/penile wound at home. Incidentally in the ED patient also had a brief syncopal episode while he was having a blood draw for labs. In the ED lab work showed hemoglobin of 7.2. Urology was consulted by ED physician and [facility]  09/17/2018: Patient seen and examined at his bedside.  No acute events overnight.  This morning hemoglobin dropped to 6.6 from 7.2.  Will transfuse 2 unit PRBCs.  His scrotal and perineal pain are well controlled on current pain management.  Patient received home health services once a week and the rest of dressing changes are done by his wife at home.   Hospital Course:  Active Problems:  Syncope  Syncope likely vasovagal during blood draw No recurrences Negative troponin  Vital signs stable on telemetry  Acute blood loss anemia suspect secondary to blood loss from the scrotal/penile wound Baseline hemoglobin 9.5 Initially transfused 1 unit Hemoglobin dropped again this morning to 6.6 We will transfuse an additional 2 units PRBCs Repeat CBC in the morning Continue to closely monitor for any acute changes  Penile/scrotal wounds Seen by urology and will follow-up outpatient Patient recently completed 10 days of Augmentin Currently afebrile with no leukocytosis Continue dressing changes as recommended by wound care and urology Will likely need to continue home health services at home for wound care Order face-to-face  Hypertension-blood pressure is at goal.  Blood pressure is low normal, will hold lisinopril to avoid hypotension, continue metoprolol tartrate 100 mg twice daily, prazosin 2 mg nightly  Dyslipidemia-continue statin  BPH-continue Flomax.  Indwelling Foley catheter in place.  Obstructive sleep apnea-continue CPAP nightly.      Code Status:Full code   Discharge Exam: BP 99/70 (BP Location: Left Arm)   Pulse 77   Temp 99.1 F (37.3 C) (Oral)   Resp 16   Ht 6\' 2"  (1.88 m)   Wt 81.6 kg   SpO2 100%   BMI 23.10 kg/m  . General: 58 y.o. year-old male well developed well nourished in no acute distress.  Alert and oriented x3. . Cardiovascular: Regular rate and rhythm with no rubs or gallops.  No thyromegaly or JVD noted.   Marland Kitchen Respiratory: Clear to auscultation with no wheezes or rales. Good inspiratory effort. . Abdomen: Soft nontender nondistended with normal bowel sounds x4 quadrants. . Musculoskeletal: No lower  extremity edema. 2/4 pulses in all 4 extremities. . Skin: No ulcerative lesions noted or rashes, . Psychiatry: Mood is appropriate for condition and setting  Discharge Instructions You were cared for by a hospitalist  during your hospital stay. If you have any questions about your discharge medications or the care you received while you were in the hospital after you are discharged, you can call the unit and asked to speak with the hospitalist on call if the hospitalist that took care of you is not available. Once you are discharged, your primary care physician will handle any further medical issues. Please note that NO REFILLS for any discharge medications will be authorized once you are discharged, as it is imperative that you return to your primary care physician (or establish a relationship with a primary care physician if you do not have one) for your aftercare needs so that they can reassess your need for medications and monitor your lab values.   Allergies as of 09/18/2018   No Known Allergies     Medication List    STOP taking these medications   amoxicillin-clavulanate 875-125 MG tablet Commonly known as:  AUGMENTIN   aspirin 81 MG chewable tablet   HYDROcodone-acetaminophen 5-325 MG tablet Commonly known as:  NORCO/VICODIN   lisinopril 40 MG tablet Commonly known as:  PRINIVIL,ZESTRIL   tamsulosin 0.4 MG Caps capsule Commonly known as:  FLOMAX     TAKE these medications   amLODipine 10 MG tablet Commonly known as:  NORVASC Take 10 mg by mouth daily.   atorvastatin 40 MG tablet Commonly known as:  LIPITOR Take 40 mg by mouth daily with supper.   chlorproMAZINE 25 MG tablet Commonly known as:  THORAZINE Take 25 mg by mouth at bedtime.   clonazePAM 1 MG tablet Commonly known as:  KLONOPIN Take 1 mg by mouth at bedtime.   Dressing Sponges 4"X4" Pads Provide 1 month supply for daily wet and dry dressing - along with sterile Saline bottle.   DULoxetine 60 MG capsule Commonly known as:  CYMBALTA Take 60 mg by mouth daily with supper.   ferrous sulfate 325 (65 FE) MG tablet Take 1 tablet (325 mg total) by mouth daily with breakfast.   LACTOBACILLUS PO Take 1 tablet by mouth  daily.   metoprolol tartrate 100 MG tablet Commonly known as:  LOPRESSOR Take 100 mg by mouth daily.   multivitamin with minerals tablet Take 1 tablet by mouth daily.   omeprazole 40 MG capsule Commonly known as:  PRILOSEC Take 40 mg by mouth daily.   oxyCODONE 5 MG immediate release tablet Commonly known as:  Oxy IR/ROXICODONE Take 1 tablet (5 mg total) by mouth 3 (three) times daily as needed for moderate pain (unrelieved by Hydrocodone).   polycarbophil 625 MG tablet Commonly known as:  FIBERCON Take 625 mg by mouth daily.   prazosin 1 MG capsule Commonly known as:  MINIPRESS Take 2 mg by mouth at bedtime.   Vitamin D3 50 MCG (2000 UT) Tabs Take 2,000 Units by mouth daily.      No Known Allergies Follow-up [place]. Call in 1 day(s).   Specialty:  General Practice Why:  please call for a post hospital follow up appointment  Contact information: [place] [address] [phone]        Alexis Frock, MD. Call in 1 day(s).   Specialty:  Urology Why:  please call for a post hospital follow up appointment  Contact information: [place] [id number] [phone]            The results of significant diagnostics from this hospitalization (including imaging, microbiology, ancillary and laboratory) are listed below for reference.    Significant Diagnostic Studies: No results found.  Microbiology: Recent Results (from the past 240 hour(s))  Urine Culture     Status: None   Collection Time: 09/09/18 11:43 AM  Result Value Ref Range Status   Urine Culture, Routine Final report  Final   Organism ID, Bacteria Comment  Final    Comment: Mixed urogenital flora Less than 10,000 colonies/mL      Labs: Basic Metabolic Panel: Recent Labs  Lab 09/12/18 1816 09/16/18 0310 09/16/18 1012 09/17/18 0401 09/18/18 0214  NA 140 140 139 138 136  K 4.0 4.1 4.0 3.8 3.7  CL 111 114* 113* 113* 106    CO2 20* 20* 17* 21* 24  GLUCOSE 95 161* 115* 111* 108*  BUN 10 14 13 13 10   CREATININE 1.13 1.10 0.92 1.01 1.00  CALCIUM 8.8* 8.1* 8.2* 8.2* 8.2*   Liver Function Tests: Recent Labs  Lab 09/16/18 1012  AST 20  ALT 22  ALKPHOS 63  BILITOT 0.9  PROT 6.0*  ALBUMIN 2.3*   No results for input(s): LIPASE, AMYLASE in the last 168 hours. No results for input(s): AMMONIA in the last 168 hours. CBC: Recent Labs  Lab 09/12/18 1816 09/16/18 0310 09/16/18 1012 09/17/18 0401 09/17/18 1715 09/18/18 0214  WBC 10.0 12.2* 11.0* 6.6  --  8.8  NEUTROABS 5.9 8.5*  --   --   --   --   HGB 8.7* 7.2* 8.0* 6.6* 8.4* 8.5*  HCT 29.1* 24.8* 29.0* 22.7* 27.6* 27.5*  MCV 95.7 96.1 101.0* 95.8  --  94.2  PLT 325 227 211 221  --  199   Cardiac Enzymes: Recent Labs  Lab 09/16/18 1205 09/16/18 1748 09/16/18 2318  TROPONINI <0.03 <0.03 <0.03   BNP: BNP (last 3 results) Recent Labs    08/18/18 0359  BNP 219.6*    ProBNP (last 3 results) No results for input(s): PROBNP in the last 8760 hours.  CBG: No results for input(s): GLUCAP in the last 168 hours.     Signed:  Kayleen Memos, MD [facility] 09/18/2018, 3:06 PM

## 2018-09-18 NOTE — Evaluation (Signed)
Physical Therapy Evaluation Patient Details Name: Phillip Blackwell MRN: [id number] DOB: 1961-05-19 Today's Date: 09/18/2018   History of Present Illness  58 yo male admitted to ED on 2/6 for bleeding and drainage around suprapubic catheter site and perineal wounds. Pt with suprapubic catheter placed ~2 weeks ago. PMH includes partial colectomy with colostomy, prostate cancer s/p radiation, recent hospitalization for sepsis due to perforated rectal ulcer, I&D of perineum, anxiety, depression, sleep apnea, stroke 2014.   Clinical Impression   Pt presents with perineal pain, generalized weakness, difficulty with dynamic standing balance, and decreased tolerance for ambulation due to pain and deconditioning. Pt to benefit from acute PT to address deficits. Pt ambulated 200 ft with no AD, mild unsteadiness with challenges to gait. PT recommending HHPT to continue to address mobility deficits upon d/c. PT to progress mobility as tolerated, and will continue to follow acutely.      Follow Up Recommendations Supervision for mobility/OOB;Home health PT    Equipment Recommendations  None recommended by PT    Recommendations for Other Services       Precautions / Restrictions Precautions Precautions: Fall Precaution Comments: colostomy, suprapubic foley catheter Restrictions Weight Bearing Restrictions: No      Mobility  Bed Mobility Overal bed mobility: Needs Assistance Bed Mobility: Supine to Sit     Supine to sit: Min guard;HOB elevated Sit to supine: Min guard;HOB elevated   General bed mobility comments: Min guard for supine<>sit for safety. Pt with increased time and use of bedrails.   Transfers Overall transfer level: Needs assistance Equipment used: None Transfers: Sit to/from Stand Sit to Stand: From elevated surface;Min guard         General transfer comment: Min guard for safety, elevated bed for ease of sit to stand. Pt with self-steadying upon standing.    Ambulation/Gait Ambulation/Gait assistance: Min guard Gait Distance (Feet): 200 Feet Assistive device: None Gait Pattern/deviations: Step-through pattern;Decreased stride length;Trunk flexed;Narrow base of support Gait velocity: decr - 1.39 ft/sec gait speed  Gait velocity interpretation: 1.31 - 2.62 ft/sec, indicative of limited community ambulator General Gait Details: Min guard for safety, occasional steady assist when challenging pt's gait. Verbal cuing for upright posture.  Stairs            Wheelchair Mobility    Modified Rankin (Stroke Patients Only)       Balance Overall balance assessment: Needs assistance Sitting-balance support: Feet supported Sitting balance-Leahy Scale: Good     Standing balance support: No upper extremity supported Standing balance-Leahy Scale: Fair Standing balance comment: able to ambulate without support, difficulty with accepting challenge to gait              High level balance activites: Head turns;Other (comment) High Level Balance Comments: pt with weaving of gait with horizontal and vertical head turns. Pt required lateral steadying step strategy with tandem ambulation.              Pertinent Vitals/Pain Pain Assessment: 0-10 Pain Score: 7  Pain Location: perineal area Pain Descriptors / Indicators: Aching;Sore Pain Intervention(s): Limited activity within patient's tolerance;Monitored during session    [place] expects to be discharged to:: Private residence Living Arrangements: Spouse/significant other Available Help at Discharge: Family;Available PRN/intermittently Type of Home: House Home Access: Stairs to enter Entrance Stairs-Rails: None Entrance Stairs-Number of Steps: 4 Home Layout: One level Home Equipment: Walker - 2 wheels;Cane - single point;Crutches      Prior Function Level of Independence: Independent  Comments: Pt with occasional AD use, but not typically. Pt's  wife helps him with wound management.      Hand Dominance   Dominant Hand: Right    Extremity/Trunk Assessment   Upper Extremity Assessment Upper Extremity Assessment: Generalized weakness    Lower Extremity Assessment Lower Extremity Assessment: Generalized weakness    Cervical / Trunk Assessment Cervical / Trunk Assessment: Normal  Communication   Communication: No difficulties  Cognition Arousal/Alertness: Awake/alert Behavior During Therapy: WFL for tasks assessed/performed;Flat affect Overall Cognitive Status: Within Functional Limits for tasks assessed                                        General Comments      Exercises     Assessment/Plan    PT Assessment Patient needs continued PT services  PT Problem List Decreased strength;Pain;Decreased activity tolerance;Decreased balance;Decreased mobility       PT Treatment Interventions DME instruction;Therapeutic exercise;Balance training;Functional mobility training;Therapeutic activities;Patient/family education;Gait training;Stair training    PT Goals (Current goals can be found in the Care Plan section)  Acute Rehab PT Goals Patient Stated Goal: none stated  PT Goal Formulation: With patient Time For Goal Achievement: 10/02/18 Potential to Achieve Goals: Good    Frequency Min 3X/week   Barriers to discharge        Co-evaluation               AM-PAC PT "6 Clicks" Mobility  Outcome Measure Help needed turning from your back to your side while in a flat bed without using bedrails?: A Little Help needed moving from lying on your back to sitting on the side of a flat bed without using bedrails?: A Little Help needed moving to and from a bed to a chair (including a wheelchair)?: A Little Help needed standing up from a chair using your arms (e.g., wheelchair or bedside chair)?: A Little Help needed to walk in hospital room?: A Little Help needed climbing 3-5 steps with a railing? : A  Little 6 Click Score: 18    End of Session Equipment Utilized During Treatment: Gait belt Activity Tolerance: Patient limited by pain;Patient limited by fatigue Patient left: in bed;with call bell/phone within reach Nurse Communication: Mobility status PT Visit Diagnosis: Muscle weakness (generalized) (M62.81);Difficulty in walking, not elsewhere classified (R26.2)    Time: 9030-0923 PT Time Calculation (min) (ACUTE ONLY): 12 min   Charges:   PT Evaluation $PT Eval Low Complexity: 1 Low          Aritha Huckeba Conception Chancy, PT Acute Rehabilitation Services Pager [phone]  Office [phone]   Jaiquan Temme D Elonda Husky 09/18/2018, 11:27 AM

## 2018-09-18 NOTE — Discharge Instructions (Signed)
Blood Transfusion, Adult, Care After This sheet gives you information about how to care for yourself after your procedure. Your doctor may also give you more specific instructions. If you have problems or questions, contact your doctor. Follow these instructions at home:   Take over-the-counter and prescription medicines only as told by your doctor.  Go back to your normal activities as told by your doctor.  Follow instructions from your doctor about how to take care of the area where an IV tube was put into your vein (insertion site). Make sure you: ? Wash your hands with soap and water before you change your bandage (dressing). If there is no soap and water, use hand sanitizer. ? Change your bandage as told by your doctor.  Check your IV insertion site every day for signs of infection. Check for: ? More redness, swelling, or pain. ? More fluid or blood. ? Warmth. ? Pus or a bad smell. Contact a doctor if:  You have more redness, swelling, or pain around the IV insertion site.  You have more fluid or blood coming from the IV insertion site.  Your IV insertion site feels warm to the touch.  You have pus or a bad smell coming from the IV insertion site.  Your pee (urine) turns pink, red, or brown.  You feel weak after doing your normal activities. Get help right away if:  You have signs of a serious allergic or body defense (immune) system reaction, including: ? Itchiness. ? Hives. ? Trouble breathing. ? Anxiety. ? Pain in your chest or lower back. ? Fever, flushing, and chills. ? Fast pulse. ? Rash. ? Watery poop (diarrhea). ? Throwing up (vomiting). ? Dark pee. ? Serious headache. ? Dizziness. ? Stiff neck. ? Yellow color in your face or the white parts of your eyes (jaundice). Summary  After a blood transfusion, return to your normal activities as told by your doctor.  Every day, check for signs of infection where the IV tube was put into your vein.  Some  signs of infection are warm skin, more redness and pain, more fluid or blood, and pus or a bad smell where the needle went in.  Contact your doctor if you feel weak or have any unusual symptoms. This information is not intended to replace advice given to you by your health care provider. Make sure you discuss any questions you have with your health care provider. Document Released: 08/18/2014 Document Revised: 03/21/2016 Document Reviewed: 03/21/2016 Elsevier Interactive Patient Education  2019 [employer] Inc[employer]  

## 2018-09-19 NOTE — Plan of Care (Signed)
Discharge instructions reviewed with patient and wife, questions answered, verbalized understanding.  Foley catheter in place, switched over to leg bag with instructions on how to switch back and forth between bedside drainage bag and leg bag.  Patient transported to main entrance via wheelchair to be taken home by wife.  Hardcopy script for OxyIR given to patient.

## 2018-10-17 ENCOUNTER — Encounter (HOSPITAL_COMMUNITY): Payer: Self-pay | Admitting: Pharmacy Technician

## 2018-10-17 ENCOUNTER — Other Ambulatory Visit: Payer: Self-pay

## 2018-10-17 ENCOUNTER — Observation Stay (HOSPITAL_COMMUNITY): Payer: No Typology Code available for payment source

## 2018-10-17 ENCOUNTER — Inpatient Hospital Stay (HOSPITAL_COMMUNITY)
Admission: EM | Admit: 2018-10-17 | Discharge: 2018-10-20 | DRG: 698 | Disposition: A | Discharge status: Home / Self Care | Payer: No Typology Code available for payment source | Attending: Internal Medicine | Admitting: Internal Medicine

## 2018-10-17 ENCOUNTER — Emergency Department (HOSPITAL_COMMUNITY): Payer: No Typology Code available for payment source

## 2018-10-17 DIAGNOSIS — N390 Urinary tract infection, site not specified: Secondary | ICD-10-CM | POA: Diagnosis present

## 2018-10-17 DIAGNOSIS — Z79899 Other long term (current) drug therapy: Secondary | ICD-10-CM

## 2018-10-17 DIAGNOSIS — T83518A Infection and inflammatory reaction due to other urinary catheter, initial encounter: Secondary | ICD-10-CM | POA: Diagnosis not present

## 2018-10-17 DIAGNOSIS — A419 Sepsis, unspecified organism: Secondary | ICD-10-CM | POA: Diagnosis present

## 2018-10-17 DIAGNOSIS — Z8249 Family history of ischemic heart disease and other diseases of the circulatory system: Secondary | ICD-10-CM

## 2018-10-17 DIAGNOSIS — K626 Ulcer of anus and rectum: Secondary | ICD-10-CM | POA: Diagnosis present

## 2018-10-17 DIAGNOSIS — K625 Hemorrhage of anus and rectum: Secondary | ICD-10-CM | POA: Diagnosis present

## 2018-10-17 DIAGNOSIS — B954 Other streptococcus as the cause of diseases classified elsewhere: Secondary | ICD-10-CM | POA: Diagnosis present

## 2018-10-17 DIAGNOSIS — I10 Essential (primary) hypertension: Secondary | ICD-10-CM | POA: Diagnosis present

## 2018-10-17 DIAGNOSIS — D539 Nutritional anemia, unspecified: Secondary | ICD-10-CM | POA: Insufficient documentation

## 2018-10-17 DIAGNOSIS — N179 Acute kidney failure, unspecified: Secondary | ICD-10-CM | POA: Diagnosis present

## 2018-10-17 DIAGNOSIS — Y846 Urinary catheterization as the cause of abnormal reaction of the patient, or of later complication, without mention of misadventure at the time of the procedure: Secondary | ICD-10-CM | POA: Diagnosis present

## 2018-10-17 DIAGNOSIS — D649 Anemia, unspecified: Secondary | ICD-10-CM | POA: Diagnosis present

## 2018-10-17 DIAGNOSIS — D62 Acute posthemorrhagic anemia: Secondary | ICD-10-CM | POA: Diagnosis present

## 2018-10-17 DIAGNOSIS — Z923 Personal history of irradiation: Secondary | ICD-10-CM

## 2018-10-17 DIAGNOSIS — C61 Malignant neoplasm of prostate: Secondary | ICD-10-CM | POA: Diagnosis not present

## 2018-10-17 DIAGNOSIS — R6510 Systemic inflammatory response syndrome (SIRS) of non-infectious origin without acute organ dysfunction: Secondary | ICD-10-CM | POA: Diagnosis present

## 2018-10-17 DIAGNOSIS — K632 Fistula of intestine: Secondary | ICD-10-CM | POA: Diagnosis present

## 2018-10-17 DIAGNOSIS — Z933 Colostomy status: Secondary | ICD-10-CM

## 2018-10-17 DIAGNOSIS — Z8673 Personal history of transient ischemic attack (TIA), and cerebral infarction without residual deficits: Secondary | ICD-10-CM

## 2018-10-17 DIAGNOSIS — G4733 Obstructive sleep apnea (adult) (pediatric): Secondary | ICD-10-CM | POA: Diagnosis present

## 2018-10-17 DIAGNOSIS — K921 Melena: Secondary | ICD-10-CM | POA: Diagnosis present

## 2018-10-17 DIAGNOSIS — K219 Gastro-esophageal reflux disease without esophagitis: Secondary | ICD-10-CM | POA: Diagnosis present

## 2018-10-17 DIAGNOSIS — Z8546 Personal history of malignant neoplasm of prostate: Secondary | ICD-10-CM

## 2018-10-17 DIAGNOSIS — E861 Hypovolemia: Secondary | ICD-10-CM | POA: Diagnosis present

## 2018-10-17 HISTORY — DX: Ulcer of anus and rectum: K62.6

## 2018-10-17 HISTORY — DX: Malignant (primary) neoplasm, unspecified: C80.1

## 2018-10-17 LAB — CBC WITH DIFFERENTIAL/PLATELET
Abs Immature Granulocytes: 0.2 10*3/uL — ABNORMAL HIGH (ref 0.00–0.07)
Basophils Absolute: 0 10*3/uL (ref 0.0–0.1)
Basophils Relative: 0 %
EOS ABS: 0 10*3/uL (ref 0.0–0.5)
Eosinophils Relative: 0 %
HCT: 17.6 % — ABNORMAL LOW (ref 39.0–52.0)
Hemoglobin: 5.1 g/dL — CL (ref 13.0–17.0)
Immature Granulocytes: 1 %
Lymphocytes Relative: 6 %
Lymphs Abs: 1.4 10*3/uL (ref 0.7–4.0)
MCH: 26.6 pg (ref 26.0–34.0)
MCHC: 29 g/dL — ABNORMAL LOW (ref 30.0–36.0)
MCV: 91.7 fL (ref 80.0–100.0)
Monocytes Absolute: 1.6 10*3/uL — ABNORMAL HIGH (ref 0.1–1.0)
Monocytes Relative: 7 %
Neutro Abs: 20.9 10*3/uL — ABNORMAL HIGH (ref 1.7–7.7)
Neutrophils Relative %: 86 %
PLATELETS: 463 10*3/uL — AB (ref 150–400)
RBC: 1.92 MIL/uL — ABNORMAL LOW (ref 4.22–5.81)
RDW: 15.1 % (ref 11.5–15.5)
WBC: 24.1 10*3/uL — ABNORMAL HIGH (ref 4.0–10.5)
nRBC: 0 % (ref 0.0–0.2)

## 2018-10-17 LAB — URINALYSIS, ROUTINE W REFLEX MICROSCOPIC
Bilirubin Urine: NEGATIVE
Glucose, UA: 50 mg/dL — AB
Ketones, ur: NEGATIVE mg/dL
Nitrite: NEGATIVE
Protein, ur: 30 mg/dL — AB
Specific Gravity, Urine: 1.014 (ref 1.005–1.030)
WBC, UA: >50 WBC/hpf — ABNORMAL HIGH (ref 0–5)
pH: 5 (ref 5.0–8.0)

## 2018-10-17 LAB — LACTIC ACID, PLASMA
Lactic Acid, Venous: 3.1 mmol/L (ref 0.5–1.9)
Lactic Acid, Venous: 1.3 mmol/L (ref 0.5–1.9)

## 2018-10-17 LAB — COMPREHENSIVE METABOLIC PANEL
ALT: 50 U/L — ABNORMAL HIGH (ref 0–44)
ANION GAP: 10 (ref 5–15)
AST: 65 U/L — ABNORMAL HIGH (ref 15–41)
Albumin: 2.3 g/dL — ABNORMAL LOW (ref 3.5–5.0)
Alkaline Phosphatase: 57 U/L (ref 38–126)
BUN: 45 mg/dL — ABNORMAL HIGH (ref 6–20)
CO2: 18 mmol/L — ABNORMAL LOW (ref 22–32)
Calcium: 8.7 mg/dL — ABNORMAL LOW (ref 8.9–10.3)
Chloride: 106 mmol/L (ref 98–111)
Creatinine, Ser: 2.47 mg/dL — ABNORMAL HIGH (ref 0.61–1.24)
GFR calc non Af Amer: 27 mL/min — ABNORMAL LOW (ref >60)
GFR, EST AFRICAN AMERICAN: 32 mL/min — AB (ref >60)
Glucose, Bld: 105 mg/dL — ABNORMAL HIGH (ref 70–99)
Potassium: 4.5 mmol/L (ref 3.5–5.1)
Sodium: 134 mmol/L — ABNORMAL LOW (ref 135–145)
Total Bilirubin: 0.6 mg/dL (ref 0.3–1.2)
Total Protein: 6.9 g/dL (ref 6.5–8.1)

## 2018-10-17 LAB — CREATININE, URINE, RANDOM: Creatinine, Urine: 120.47 mg/dL

## 2018-10-17 LAB — PREPARE RBC (CROSSMATCH)

## 2018-10-17 LAB — SODIUM, URINE, RANDOM: Sodium, Ur: 46 mmol/L

## 2018-10-17 LAB — TROPONIN I: Troponin I: <0.03 ng/mL (ref <0.03)

## 2018-10-17 MED ORDER — SODIUM CHLORIDE 0.9 % IV SOLN: INTRAVENOUS | Status: DC

## 2018-10-17 MED ORDER — SODIUM CHLORIDE 0.9 % IV SOLN
250.0000 mL | INTRAVENOUS | Status: DC | PRN
  Administered 2018-10-17: 10 mL via INTRAVENOUS

## 2018-10-17 MED ORDER — SODIUM CHLORIDE 0.9% FLUSH: 3.0000 mL | INTRAVENOUS | Status: DC | PRN

## 2018-10-17 MED ORDER — SODIUM CHLORIDE 0.9 % IV BOLUS
1000.0000 mL | Freq: Once | INTRAVENOUS | Status: AC
  Administered 2018-10-17: 1000 mL via INTRAVENOUS

## 2018-10-17 MED ORDER — VANCOMYCIN HCL 10 G IV SOLR
1500.0000 mg | Freq: Once | INTRAVENOUS | Status: AC
  Administered 2018-10-17: 1500 mg via INTRAVENOUS
  Filled 2018-10-17: qty 1500

## 2018-10-17 MED ORDER — SODIUM CHLORIDE 0.9 % IV BOLUS (SEPSIS)
500.0000 mL | Freq: Once | INTRAVENOUS | Status: AC
  Administered 2018-10-17: 500 mL via INTRAVENOUS

## 2018-10-17 MED ORDER — HYDROCODONE-ACETAMINOPHEN 5-325 MG PO TABS
1.0000 | ORAL_TABLET | ORAL | Status: DC | PRN
  Administered 2018-10-17 – 2018-10-20 (×6): 2 via ORAL
  Filled 2018-10-17 (×6): qty 2

## 2018-10-17 MED ORDER — SODIUM CHLORIDE 0.9 % IV BOLUS (SEPSIS)
1000.0000 mL | Freq: Once | INTRAVENOUS | Status: AC
  Administered 2018-10-17: 1000 mL via INTRAVENOUS

## 2018-10-17 MED ORDER — ONDANSETRON HCL 4 MG PO TABS: 4.0000 mg | ORAL_TABLET | Freq: Four times a day (QID) | ORAL | Status: DC | PRN

## 2018-10-17 MED ORDER — ACETAMINOPHEN 650 MG RE SUPP: 650.0000 mg | Freq: Four times a day (QID) | RECTAL | Status: DC | PRN

## 2018-10-17 MED ORDER — SODIUM CHLORIDE 0.9 % IV SOLN
10.0000 mL/h | Freq: Once | INTRAVENOUS | Status: AC
  Administered 2018-10-18: 10 mL/h via INTRAVENOUS

## 2018-10-17 MED ORDER — VANCOMYCIN HCL IN DEXTROSE 1-5 GM/200ML-% IV SOLN: 1000.0000 mg | INTRAVENOUS | Status: DC

## 2018-10-17 MED ORDER — VANCOMYCIN HCL IN DEXTROSE 1-5 GM/200ML-% IV SOLN: 1000.0000 mg | Freq: Once | INTRAVENOUS | Status: DC

## 2018-10-17 MED ORDER — SODIUM CHLORIDE 0.9% FLUSH
3.0000 mL | Freq: Two times a day (BID) | INTRAVENOUS | Status: DC
  Administered 2018-10-18 – 2018-10-20 (×4): 3 mL via INTRAVENOUS

## 2018-10-17 MED ORDER — SODIUM CHLORIDE 0.9 % IV SOLN: 2.0000 g | INTRAVENOUS | Status: DC

## 2018-10-17 MED ORDER — ONDANSETRON HCL 4 MG/2ML IJ SOLN: 4.0000 mg | Freq: Four times a day (QID) | INTRAMUSCULAR | Status: DC | PRN

## 2018-10-17 MED ORDER — ACETAMINOPHEN 325 MG PO TABS: 650.0000 mg | ORAL_TABLET | Freq: Four times a day (QID) | ORAL | Status: DC | PRN

## 2018-10-17 MED ORDER — METRONIDAZOLE IN NACL 5-0.79 MG/ML-% IV SOLN
500.0000 mg | Freq: Three times a day (TID) | INTRAVENOUS | Status: DC
  Administered 2018-10-17 – 2018-10-18 (×2): 500 mg via INTRAVENOUS
  Filled 2018-10-17 (×2): qty 100

## 2018-10-17 MED ORDER — SODIUM CHLORIDE 0.9% FLUSH
3.0000 mL | Freq: Two times a day (BID) | INTRAVENOUS | Status: DC
  Administered 2018-10-18 – 2018-10-20 (×5): 3 mL via INTRAVENOUS

## 2018-10-17 MED ORDER — SODIUM CHLORIDE 0.9 % IV SOLN
2.0000 g | Freq: Once | INTRAVENOUS | Status: AC
  Administered 2018-10-17: 2 g via INTRAVENOUS
  Filled 2018-10-17: qty 2

## 2018-10-17 NOTE — ED Notes (Signed)
Patient transported to X-ray 

## 2018-10-17 NOTE — ED Triage Notes (Signed)
Pt arrives via POV with generalized weakness. Gradual onset since Friday. Pt states he is unable to stand. Also reports blood clots from rectum. States this has been ongoing for approx 1 year. States he was told it was from the radiation tx he received for his prostate CA.

## 2018-10-17 NOTE — ED Notes (Signed)
Date and time results received: 10/17/18 2005** (use smartphrase ".now" to insert current time)  Test: lactic acid  Critical Value: 3.1  Name of Provider Notified: Dr. Rogene Houston  Orders Received? Or Actions Taken?: Actions Taken: no orders

## 2018-10-17 NOTE — Progress Notes (Signed)
Pharmacy Antibiotic Note  Phillip Blackwell is a 58 y.o. male admitted on 10/17/2018 with sepsis.  Pharmacy has been consulted for vancomycin, metronidazole, and cefepime dosing.  Was found with generalized weakness with Hgb 5.1. WBC 24.1, LA 3.1, afebrile. Scr 2.47 (CrCl 35 mL/min)- up from baseline Scr ~1.   Plan: Start cefepime 2 gram every 24 hours Order vancomycin 1500 mg IV once then 1 g IV every 24 hours Start metronidazole 500 mg IV every 8 hours Monitor renal fx, cx results, clinical pic, and levels as appropriate    Temp (24hrs), Avg:98.8 F (37.1 C), Min:98.7 F (37.1 C), Max:98.8 F (37.1 C)  Recent Labs  Lab 10/17/18 1920  WBC 24.1*  CREATININE 2.47*  LATICACIDVEN 3.1*    CrCl cannot be calculated (Unknown ideal weight.).    No Known Allergies  Antimicrobials this admission: Vancomycin 3/8 >>  Metronidazole 3/8 >>  Cefepime 3/8 >>  Dose adjustments this admission: N/A  Microbiology results: 3/8 BCx: sent 3/8 UCx: sent   Thank you for allowing pharmacy to be a part of this patient's care.  Antonietta Jewel, PharmD, [place] Clinical Pharmacist  Pager: [phone] Phone: [phone] 10/17/2018 8:42 PM

## 2018-10-17 NOTE — H&P (Signed)
History and Physical    Phillip Blackwell JAS:[id number] DOB: Jul 17, 1961 DOA: 10/17/2018  PCP: [facility]   Patient coming from: Home   Chief Complaint: Generalized weakness, subjective fever/chills   HPI: Phillip Blackwell is a 58 y.o. male with medical history significant for prostate cancer status post radiation, hypertension, OSA, depression, and history of symptomatic anemia from bleeding rectal ulcer, now presenting to the emergency department with generalized weakness, subjective fevers, and chills.  Patient reports he been in his usual state of health until 2 days ago when he noted generalized weakness which has been worsening insidiously since that time.  He has had some subjective fevers and chills at times over the past couple days, but denies any cough, shortness of breath, abdominal pain, diarrhea, or vomiting.  He reports intermittent passage of blood clots per rectum for about a year now, occurring every couple months and lasting a few days at a time.  He had 1 of these episodes last week, marked by passage of several large blood clots and scant red blood until 10/15/2018.  He never sees blood or dark stool in his colostomy bag.  Denies any NSAID or alcohol use.  Denies abdominal pain.  Reports that he had an upper endoscopy about a year ago and states that this was normal.  He has history of rectal ulcer felt to be secondary to prostate radiation.  ED Course: Upon arrival to the ED, patient is found to be afebrile, saturating well on room air, tachycardic to 110, and with blood pressure 105/56.  EKG features a sinus rhythm and chest x-ray is negative for acute cardiopulmonary disease.  Chemistry panel is notable for creatinine of 2.47, up from 1.0 a month ago.  CBC features a leukocytosis to 24,000, thrombocytosis, and hemoglobin of 5.1, down from 8.5 a month earlier.  Troponin is undetectable and lactic acid was elevated to 3.1.  Blood cultures were ordered and the patient was given 30  cc/kg normal saline bolus, and was started on empiric antibiotics.  2 units RBCs were ordered for transfusion.  Tachycardia resolved with fluids, blood pressure remained stable, and the patient will be observed for ongoing evaluation and management.  Review of Systems:  All other systems reviewed and apart from HPI, are negative.  Past Medical History:  Diagnosis Date  . Anxiety   . Depression   . GERD (gastroesophageal reflux disease)   . Hypertension   . Rectal bleeding 07/07/2018  . Sleep apnea   . Stroke [facility])    2014    Past Surgical History:  Procedure Laterality Date  . BIOPSY  07/10/2018   Procedure: BIOPSY;  Surgeon: Otis Brace, MD;  Location: [place];  Service: Gastroenterology;;  . Consuela Mimes N/A 08/18/2018   Procedure: CYSTOSCOPY;  Surgeon: Alexis Frock, MD;  Location: [place];  Service: Urology;  Laterality: N/A;  . FLEXIBLE SIGMOIDOSCOPY N/A 07/10/2018   Procedure: FLEXIBLE SIGMOIDOSCOPY;  Surgeon: Otis Brace, MD;  Location: [place];  Service: Gastroenterology;  Laterality: N/A;  Adult EGD Scope. Peds biopsy forceps.  Marland Kitchen HERNIA REPAIR    . IRRIGATION AND DEBRIDEMENT ABSCESS N/A 08/18/2018   Procedure: IRRIGATION AND DEBRIDEMENT penal scrotal ABSCESS;  Surgeon: Alexis Frock, MD;  Location: [place];  Service: Urology;  Laterality: N/A;  . IRRIGATION AND DEBRIDEMENT ABSCESS N/A 09/01/2018   Procedure: IRRIGATION AND DEBRIDEMENT ABSCESS;  Surgeon: Alexis Frock, MD;  Location: [place];  Service: Urology;  Laterality: N/A;  . LAPAROSCOPIC PARTIAL COLECTOMY N/A 08/23/2018  Procedure: LAPAROSCOPIC  END COLOSTOMY CREATION;  Surgeon: Donnie Mesa, MD;  Location: [place];  Service: General;  Laterality: N/A;  . SCROTAL EXPLORATION N/A 08/23/2018   Procedure: SECONDARY PENILE/SCROTUM DEBRIDEMENT;  Surgeon: Alexis Frock, MD;  Location: [place];  Service: Urology;  Laterality: N/A;  . SCROTAL EXPLORATION N/A 08/26/2018   Procedure: THIRD STAGE PENIS AND SCROTAL  IRRIGATION AND  DEBRIDEMENT;  Surgeon: Alexis Frock, MD;  Location: [place];  Service: Urology;  Laterality: N/A;     reports that he has never smoked. He has never used smokeless tobacco. He reports that he does not drink alcohol or use drugs.  No Known Allergies  Family History  Problem Relation Age of Onset  . Hypertension Mother      Prior to Admission medications   Medication Sig Start Date End Date Taking? Authorizing Provider  amLODipine (NORVASC) 10 MG tablet Take 10 mg by mouth daily.    [provider]  atorvastatin (LIPITOR) 40 MG tablet Take 40 mg by mouth daily with supper.     [provider]  chlorproMAZINE (THORAZINE) 25 MG tablet Take 25 mg by mouth at bedtime.    [provider]  Cholecalciferol (VITAMIN D3) 2000 units TABS Take 2,000 Units by mouth daily.     [provider]  clonazePAM (KLONOPIN) 1 MG tablet Take 1 mg by mouth at bedtime.     [provider]  DULoxetine (CYMBALTA) 60 MG capsule Take 60 mg by mouth daily with supper.     [provider]  ferrous sulfate 325 (65 FE) MG tablet Take 1 tablet (325 mg total) by mouth daily with breakfast. 07/11/18   Thurnell Lose, MD  Gauze Pads & Dressings (DRESSING SPONGES) 4"X4" PADS Provide 1 month supply for daily wet and dry dressing - along with sterile Saline bottle. 09/03/18   Thurnell Lose, MD  LACTOBACILLUS PO Take 1 tablet by mouth daily.    [provider]  metoprolol tartrate (LOPRESSOR) 100 MG tablet Take 100 mg by mouth daily.     [provider]  Multiple Vitamins-Minerals (MULTIVITAMIN WITH MINERALS) tablet Take 1 tablet by mouth daily.    [provider]  omeprazole (PRILOSEC) 40 MG capsule Take 40 mg by mouth daily.     [provider]  oxyCODONE (OXY IR/ROXICODONE) 5 MG immediate release tablet Take 1 tablet (5 mg total) by mouth 3 (three) times daily as needed for moderate pain (unrelieved by Hydrocodone).  09/18/18   Kayleen Memos, DO  polycarbophil (FIBERCON) 625 MG tablet Take 625 mg by mouth daily.    [provider]  prazosin (MINIPRESS) 1 MG capsule Take 2 mg by mouth at bedtime.     [provider]    Physical Exam: Vitals:   10/17/18 1811 10/17/18 1815 10/17/18 1830 10/17/18 2100  BP:  (!) 105/56 (!) 126/59   Pulse:  (!) 109 100   Resp:  17 14   Temp: 98.7 F (37.1 C)     TempSrc: Oral     SpO2:  100% 100%   Weight:    79.4 kg  Height:    6' (1.829 m)     Constitutional: NAD, calm  Eyes: PERTLA, lids and conjunctivae normal ENMT: Mucous membranes are moist. Posterior pharynx clear of any exudate or lesions.   Neck: normal, supple, no masses, no thyromegaly Respiratory: clear to auscultation bilaterally, no wheezing, no crackles. Normal respiratory effort.    Cardiovascular: S1 & S2 heard,  regular rate and rhythm. No extremity edema.  Abdomen: No distension, no tenderness, soft. Bowel sounds active.  Musculoskeletal: no clubbing / cyanosis. No joint deformity upper and lower extremities.    Skin: no significant rashes, lesions, ulcers. Warm, dry, well-perfused. Neurologic: CN 2-12 grossly intact. Sensation intact. Strength 5/5 in all 4 limbs.  Psychiatric: Alert and oriented x 3. Calm, cooperative.    Labs on Admission: I have personally reviewed following labs and imaging studies  CBC: Recent Labs  Lab 10/17/18 1920  WBC 24.1*  NEUTROABS 20.9*  HGB 5.1*  HCT 17.6*  MCV 91.7  PLT 509*   Basic Metabolic Panel: Recent Labs  Lab 10/17/18 1920  NA 134*  K 4.5  CL 106  CO2 18*  GLUCOSE 105*  BUN 45*  CREATININE 2.47*  CALCIUM 8.7*   GFR: Estimated Creatinine Clearance: 35.3 mL/min (A) (by C-G formula based on SCr of 2.47 mg/dL (H)). Liver Function Tests: Recent Labs  Lab 10/17/18 1920  AST 65*  ALT 50*  ALKPHOS 57  BILITOT 0.6  PROT 6.9  ALBUMIN 2.3*   No results for input(s): LIPASE, AMYLASE in the last 168 hours. No results  for input(s): AMMONIA in the last 168 hours. Coagulation Profile: No results for input(s): INR, PROTIME in the last 168 hours. Cardiac Enzymes: Recent Labs  Lab 10/17/18 1920  TROPONINI <0.03   BNP (last 3 results) No results for input(s): PROBNP in the last 8760 hours. HbA1C: No results for input(s): HGBA1C in the last 72 hours. CBG: No results for input(s): GLUCAP in the last 168 hours. Lipid Profile: No results for input(s): CHOL, HDL, LDLCALC, TRIG, CHOLHDL, LDLDIRECT in the last 72 hours. Thyroid Function Tests: No results for input(s): TSH, T4TOTAL, FREET4, T3FREE, THYROIDAB in the last 72 hours. Anemia Panel: No results for input(s): VITAMINB12, FOLATE, FERRITIN, TIBC, IRON, RETICCTPCT in the last 72 hours. Urine analysis:    Component Value Date/Time   COLORURINE AMBER (A) 08/13/2018 2010   APPEARANCEUR CLOUDY (A) 08/13/2018 2010   LABSPEC 1.014 08/13/2018 2010   PHURINE 5.0 08/13/2018 2010   GLUCOSEU NEGATIVE 08/13/2018 2010   HGBUR NEGATIVE 08/13/2018 2010   BILIRUBINUR negative 09/09/2018 1136   KETONESUR negative 09/09/2018 [place] 08/13/2018 2010   PROTEINUR 30 (A) 08/13/2018 2010   UROBILINOGEN 0.2 09/09/2018 1136   NITRITE Negative 09/09/2018 1136   NITRITE NEGATIVE 08/13/2018 2010   LEUKOCYTESUR Negative 09/09/2018 1136   Sepsis Labs: @LABRCNTIP (procalcitonin:4,lacticidven:4) )No results found for this or any previous visit (from the past 240 hour(s)).   Radiological Exams on Admission: Dg Chest 2 View  Result Date: 10/17/2018 CLINICAL DATA:  Weakness EXAM: CHEST - 2 VIEW COMPARISON:  08/13/2018 FINDINGS: The heart size and mediastinal contours are within normal limits. Both lungs are clear. Disc degenerative disease of the thoracic spine. IMPRESSION: No acute abnormality of the lungs.  No focal airspace opacity. Electronically Signed   By: Eddie Candle M.D.   On: 10/17/2018 19:00    EKG: Independently reviewed. Sinus rhythm.    Assessment/Plan   1. Symptomatic anemia; rectal bleeding  - Presents with generalized weakness after an episode of rectal bleeding with no bleeding since 3/6  - He is found to have Hgb of 5.1, down from 8.5 a month ago  - He had recently been passing clots per rectum, has not seen any blood or dark stool in colostomy, and this is likely recurrent bleeding from rectal ulcer  - 2 units RBC ordered from ED,  will check post-transfusion CBC  2. SIRS  - Presents with gen weakness and subjective fevers/chills, and is found to be tachycardic with marked leukocytosis and elevated lactate  - No evidence for infection on CXR, abd exam benign, UA still not collected  - Blood cultures were ordered from ED, 30 cc/kg NS bolus given, and he was started on broad-spectrum empiric abx  - Follow-up UA results, continue antibiotics for now while following cultures and clinical course    3. Acute kidney injury  - SCr is 2.47 on admission, up from 1.0 a month earlier  - Likely prerenal azotemia in setting of blood loss and apparent hypovolemia  - Renally-dose medications, avoid nephrotoxins, check FENa and renal US, repeat chem panel in am    4. Hypertension   - BP low-normal range in ED and antihypertensives to be held initially     DVT prophylaxis: SCD's  Code Status: Full  Family Communication: Discussed with patient  Consults called: none Admission status: Observation     Vianne Bulls, MD [facility] Pager [phone]  If 7PM-7AM, please contact night-coverage [email or web address] Password [facility]  10/17/2018, 9:21 PM

## 2018-10-17 NOTE — ED Notes (Signed)
Lab called Hgb 5.1.  Dr. Rogene Houston made aware.

## 2018-10-18 ENCOUNTER — Other Ambulatory Visit: Payer: Self-pay

## 2018-10-18 ENCOUNTER — Observation Stay (HOSPITAL_COMMUNITY): Payer: No Typology Code available for payment source

## 2018-10-18 ENCOUNTER — Encounter (HOSPITAL_COMMUNITY): Payer: Self-pay | Admitting: Internal Medicine

## 2018-10-18 DIAGNOSIS — Z8249 Family history of ischemic heart disease and other diseases of the circulatory system: Secondary | ICD-10-CM | POA: Diagnosis not present

## 2018-10-18 DIAGNOSIS — Y846 Urinary catheterization as the cause of abnormal reaction of the patient, or of later complication, without mention of misadventure at the time of the procedure: Secondary | ICD-10-CM | POA: Diagnosis present

## 2018-10-18 DIAGNOSIS — D62 Acute posthemorrhagic anemia: Secondary | ICD-10-CM | POA: Diagnosis present

## 2018-10-18 DIAGNOSIS — G4733 Obstructive sleep apnea (adult) (pediatric): Secondary | ICD-10-CM | POA: Diagnosis present

## 2018-10-18 DIAGNOSIS — Z8673 Personal history of transient ischemic attack (TIA), and cerebral infarction without residual deficits: Secondary | ICD-10-CM | POA: Diagnosis not present

## 2018-10-18 DIAGNOSIS — Z79899 Other long term (current) drug therapy: Secondary | ICD-10-CM | POA: Diagnosis not present

## 2018-10-18 DIAGNOSIS — I10 Essential (primary) hypertension: Secondary | ICD-10-CM

## 2018-10-18 DIAGNOSIS — D649 Anemia, unspecified: Secondary | ICD-10-CM

## 2018-10-18 DIAGNOSIS — N390 Urinary tract infection, site not specified: Secondary | ICD-10-CM | POA: Diagnosis present

## 2018-10-18 DIAGNOSIS — N179 Acute kidney failure, unspecified: Secondary | ICD-10-CM

## 2018-10-18 DIAGNOSIS — A419 Sepsis, unspecified organism: Secondary | ICD-10-CM | POA: Diagnosis present

## 2018-10-18 DIAGNOSIS — Z933 Colostomy status: Secondary | ICD-10-CM | POA: Diagnosis not present

## 2018-10-18 DIAGNOSIS — K626 Ulcer of anus and rectum: Secondary | ICD-10-CM | POA: Diagnosis present

## 2018-10-18 DIAGNOSIS — K625 Hemorrhage of anus and rectum: Secondary | ICD-10-CM

## 2018-10-18 DIAGNOSIS — Z923 Personal history of irradiation: Secondary | ICD-10-CM | POA: Diagnosis not present

## 2018-10-18 DIAGNOSIS — K921 Melena: Secondary | ICD-10-CM | POA: Diagnosis present

## 2018-10-18 DIAGNOSIS — Z8546 Personal history of malignant neoplasm of prostate: Secondary | ICD-10-CM | POA: Diagnosis not present

## 2018-10-18 DIAGNOSIS — B954 Other streptococcus as the cause of diseases classified elsewhere: Secondary | ICD-10-CM | POA: Diagnosis present

## 2018-10-18 DIAGNOSIS — K219 Gastro-esophageal reflux disease without esophagitis: Secondary | ICD-10-CM | POA: Diagnosis present

## 2018-10-18 DIAGNOSIS — K632 Fistula of intestine: Secondary | ICD-10-CM | POA: Diagnosis present

## 2018-10-18 DIAGNOSIS — T83518A Infection and inflammatory reaction due to other urinary catheter, initial encounter: Secondary | ICD-10-CM | POA: Diagnosis present

## 2018-10-18 DIAGNOSIS — N3001 Acute cystitis with hematuria: Secondary | ICD-10-CM

## 2018-10-18 DIAGNOSIS — E861 Hypovolemia: Secondary | ICD-10-CM | POA: Diagnosis present

## 2018-10-18 LAB — CBC
HCT: 22.7 % — ABNORMAL LOW (ref 39.0–52.0)
HEMATOCRIT: 27.5 % — AB (ref 39.0–52.0)
Hemoglobin: 7.1 g/dL — ABNORMAL LOW (ref 13.0–17.0)
Hemoglobin: 8.9 g/dL — ABNORMAL LOW (ref 13.0–17.0)
MCH: 27.7 pg (ref 26.0–34.0)
MCH: 27.9 pg (ref 26.0–34.0)
MCHC: 31.3 g/dL (ref 30.0–36.0)
MCHC: 32.4 g/dL (ref 30.0–36.0)
MCV: 88.7 fL (ref 80.0–100.0)
MCV: 86.2 fL (ref 80.0–100.0)
NRBC: 0 % (ref 0.0–0.2)
Platelets: 351 10*3/uL (ref 150–400)
Platelets: 329 10*3/uL (ref 150–400)
RBC: 2.56 MIL/uL — ABNORMAL LOW (ref 4.22–5.81)
RBC: 3.19 MIL/uL — ABNORMAL LOW (ref 4.22–5.81)
RDW: 14.2 % (ref 11.5–15.5)
RDW: 14.9 % (ref 11.5–15.5)
WBC: 17 10*3/uL — ABNORMAL HIGH (ref 4.0–10.5)
WBC: 16.8 10*3/uL — ABNORMAL HIGH (ref 4.0–10.5)
nRBC: 0 % (ref 0.0–0.2)

## 2018-10-18 LAB — COMPREHENSIVE METABOLIC PANEL
ALT: 42 U/L (ref 0–44)
AST: 44 U/L — ABNORMAL HIGH (ref 15–41)
Albumin: 1.9 g/dL — ABNORMAL LOW (ref 3.5–5.0)
Alkaline Phosphatase: 48 U/L (ref 38–126)
Anion gap: 8 (ref 5–15)
BILIRUBIN TOTAL: 0.6 mg/dL (ref 0.3–1.2)
BUN: 38 mg/dL — ABNORMAL HIGH (ref 6–20)
CO2: 15 mmol/L — ABNORMAL LOW (ref 22–32)
Calcium: 7.6 mg/dL — ABNORMAL LOW (ref 8.9–10.3)
Chloride: 112 mmol/L — ABNORMAL HIGH (ref 98–111)
Creatinine, Ser: 1.93 mg/dL — ABNORMAL HIGH (ref 0.61–1.24)
GFR calc Af Amer: 43 mL/min — ABNORMAL LOW (ref >60)
GFR calc non Af Amer: 37 mL/min — ABNORMAL LOW (ref >60)
Glucose, Bld: 105 mg/dL — ABNORMAL HIGH (ref 70–99)
Potassium: 4.2 mmol/L (ref 3.5–5.1)
Sodium: 135 mmol/L (ref 135–145)
Total Protein: 5.4 g/dL — ABNORMAL LOW (ref 6.5–8.1)

## 2018-10-18 LAB — PREPARE RBC (CROSSMATCH)

## 2018-10-18 LAB — LACTIC ACID, PLASMA: Lactic Acid, Venous: 1.5 mmol/L (ref 0.5–1.9)

## 2018-10-18 MED ORDER — SODIUM CHLORIDE 0.9 % IV SOLN
1.0000 g | INTRAVENOUS | Status: DC
  Administered 2018-10-18 – 2018-10-19 (×2): 1 g via INTRAVENOUS
  Filled 2018-10-18 (×3): qty 10

## 2018-10-18 MED ORDER — SODIUM CHLORIDE 0.9% IV SOLUTION
Freq: Once | INTRAVENOUS | Status: AC
  Administered 2018-10-18: 10:00:00 via INTRAVENOUS

## 2018-10-18 MED ORDER — SODIUM CHLORIDE 0.9 % IV SOLN
INTRAVENOUS | Status: DC
  Administered 2018-10-18 – 2018-10-19 (×2): via INTRAVENOUS

## 2018-10-18 NOTE — Consult Note (Signed)
Reason for Consult: Bright red blood per rectum Referring Physician: Hospital team  Phillip Blackwell is an 58 y.o. male.  HPI: Patient seen and examined in hospital computer chart reviewed and case discussed with the hospital team and he has a long complicated history of rectal ulcer and perforation complicated by radiation therapy for his prostate cancer unfortunately he does not know the plan for possibly healing this nor is it clear in reviewing the hospital computer chart and he [facility] but is currently not doing anything and he has seen blood off and on for a while will pass a little mucus too and his previous work-up was reviewed and he has not had a problems with his ostomy bag has not seen any blood there has no other specific complaints  Past Medical History:  Diagnosis Date  . Anxiety   . Cancer ([place]) 2016  . Depression   . GERD (gastroesophageal reflux disease)   . Hypertension   . Rectal bleeding 07/07/2018  . Rectal ulceration   . Sleep apnea   . Stroke [facility])    2014    Past Surgical History:  Procedure Laterality Date  . BIOPSY  07/10/2018   Procedure: BIOPSY;  Surgeon: Otis Brace, MD;  Location: [place];  Service: Gastroenterology;;  . Consuela Mimes N/A 08/18/2018   Procedure: CYSTOSCOPY;  Surgeon: Alexis Frock, MD;  Location: [place];  Service: Urology;  Laterality: N/A;  . FLEXIBLE SIGMOIDOSCOPY N/A 07/10/2018   Procedure: FLEXIBLE SIGMOIDOSCOPY;  Surgeon: Otis Brace, MD;  Location: [place];  Service: Gastroenterology;  Laterality: N/A;  Adult EGD Scope. Peds biopsy forceps.  Marland Kitchen HERNIA REPAIR    . IRRIGATION AND DEBRIDEMENT ABSCESS N/A 08/18/2018   Procedure: IRRIGATION AND DEBRIDEMENT penal scrotal ABSCESS;  Surgeon: Alexis Frock, MD;  Location: [place];  Service: Urology;  Laterality: N/A;  . IRRIGATION AND DEBRIDEMENT ABSCESS N/A 09/01/2018   Procedure: IRRIGATION AND DEBRIDEMENT ABSCESS;  Surgeon:  Alexis Frock, MD;  Location: [place];  Service: Urology;  Laterality: N/A;  . LAPAROSCOPIC PARTIAL COLECTOMY N/A 08/23/2018   Procedure: LAPAROSCOPIC  END COLOSTOMY CREATION;  Surgeon: Donnie Mesa, MD;  Location: [place];  Service: General;  Laterality: N/A;  . SCROTAL EXPLORATION N/A 08/23/2018   Procedure: SECONDARY PENILE/SCROTUM DEBRIDEMENT;  Surgeon: Alexis Frock, MD;  Location: [place];  Service: Urology;  Laterality: N/A;  . SCROTAL EXPLORATION N/A 08/26/2018   Procedure: THIRD STAGE PENIS AND SCROTAL IRRIGATION AND  DEBRIDEMENT;  Surgeon: Alexis Frock, MD;  Location: [place];  Service: Urology;  Laterality: N/A;    Family History  Problem Relation Age of Onset  . Alzheimer's disease Mother     Social History:  reports that he has never smoked. He has never used smokeless tobacco. He reports that he does not drink alcohol or use drugs.  Allergies: No Known Allergies  Medications: I have reviewed the patient's current medications.  Results for orders placed or performed during the hospital encounter of 10/17/18 (from the past 48 hour(s))  Comprehensive metabolic panel     Status: Abnormal   Collection Time: 10/17/18  7:20 PM  Result Value Ref Range   Sodium 134 (L) 135 - 145 mmol/L   Potassium 4.5 3.5 - 5.1 mmol/L   Chloride 106 98 - 111 mmol/L   CO2 18 (L) 22 - 32 mmol/L   Glucose, Bld 105 (H) 70 - 99 mg/dL   BUN 45 (H) 6 - 20 mg/dL   Creatinine, Ser  2.47 (H) 0.61 - 1.24 mg/dL   Calcium 8.7 (L) 8.9 - 10.3 mg/dL   Total Protein 6.9 6.5 - 8.1 g/dL   Albumin 2.3 (L) 3.5 - 5.0 g/dL   AST 65 (H) 15 - 41 U/L   ALT 50 (H) 0 - 44 U/L   Alkaline Phosphatase 57 38 - 126 U/L   Total Bilirubin 0.6 0.3 - 1.2 mg/dL   GFR calc non Af Amer 27 (L) >60 mL/min   GFR calc Af Amer 32 (L) >60 mL/min   Anion gap 10 5 - 15    Comment: Performed at [facility] [address], [place], [place] [address]  CBC with Differential/Platelet     Status: Abnormal   Collection Time: 10/17/18   7:20 PM  Result Value Ref Range   WBC 24.1 (H) 4.0 - 10.5 K/uL   RBC 1.92 (L) 4.22 - 5.81 MIL/uL   Hemoglobin 5.1 (LL) 13.0 - 17.0 g/dL    Comment: REPEATED TO VERIFY THIS CRITICAL RESULT HAS VERIFIED AND BEEN CALLED TO C PRICE RN BY TIFFANY SHORT ON 03 08 2020 AT 2013, AND HAS BEEN READ BACK.     HCT 17.6 (L) 39.0 - 52.0 %   MCV 91.7 80.0 - 100.0 fL   MCH 26.6 26.0 - 34.0 pg   MCHC 29.0 (L) 30.0 - 36.0 g/dL   RDW 15.1 11.5 - 15.5 %   Platelets 463 (H) 150 - 400 K/uL   nRBC 0.0 0.0 - 0.2 %   Neutrophils Relative % 86 %   Neutro Abs 20.9 (H) 1.7 - 7.7 K/uL   Lymphocytes Relative 6 %   Lymphs Abs 1.4 0.7 - 4.0 K/uL   Monocytes Relative 7 %   Monocytes Absolute 1.6 (H) 0.1 - 1.0 K/uL   Eosinophils Relative 0 %   Eosinophils Absolute 0.0 0.0 - 0.5 K/uL   Basophils Relative 0 %   Basophils Absolute 0.0 0.0 - 0.1 K/uL   Immature Granulocytes 1 %   Abs Immature Granulocytes 0.20 (H) 0.00 - 0.07 K/uL    Comment: Performed at [facility] [address] [place], [place] [address]  Lactic acid, plasma     Status: Abnormal   Collection Time: 10/17/18  7:20 PM  Result Value Ref Range   Lactic Acid, Venous 3.1 (HH) 0.5 - 1.9 mmol/L    Comment: CRITICAL RESULT CALLED TO, READ BACK BY AND VERIFIED WITH: PRICE, C RN @ 2004 ON 10/17/2018 BY TEMOCHE, H Performed at [facility], [place] [address] [place], [place] [address]   Troponin I - Once     Status: None   Collection Time: 10/17/18  7:20 PM  Result Value Ref Range   Troponin I <0.03 <0.03 ng/mL    Comment: Performed at [facility] [address], [place], [place] [address]  Prepare RBC     Status: None   Collection Time: 10/17/18  8:25 PM  Result Value Ref Range   Order Confirmation      ORDER PROCESSED BY BLOOD BANK Performed at [facility], [place] [address], [place], [place] [address]   Type and screen [place]     Status: None (Preliminary result)   Collection Time: 10/17/18  8:25 PM   Result Value Ref Range   ABO/RH(D) O POS    Antibody Screen NEG    Sample Expiration 10/20/2018    Unit Number [id number]    Blood Component  Type RED CELLS,LR    Unit division 00    Status of Unit ISSUED    Transfusion Status OK TO TRANSFUSE    Crossmatch Result Compatible    Unit Number [id number]    Blood Component Type RBC LR PHER2    Unit division 00    Status of Unit ISSUED    Transfusion Status OK TO TRANSFUSE    Crossmatch Result Compatible    Unit Number [id number]    Blood Component Type RED CELLS,LR    Unit division 00    Status of Unit ISSUED    Transfusion Status OK TO TRANSFUSE    Crossmatch Result      Compatible Performed at [facility], [place] [address] [place], [place] [address]    Unit Number [id number]    Blood Component Type RED CELLS,LR    Unit division 00    Status of Unit ISSUED    Transfusion Status OK TO TRANSFUSE    Crossmatch Result Compatible   Culture, blood (Routine X 2) w Reflex to ID Panel     Status: None (Preliminary result)   Collection Time: 10/17/18  8:28 PM  Result Value Ref Range   Specimen Description BLOOD RIGHT ARM    Special Requests      BOTTLES DRAWN AEROBIC AND ANAEROBIC Blood Culture adequate volume   Culture      NO GROWTH < 24 HOURS Performed at [facility], [place] [address] [place], [place] [address]    Report Status PENDING   Culture, blood (Routine X 2) w Reflex to ID Panel     Status: None (Preliminary result)   Collection Time: 10/17/18  8:28 PM  Result Value Ref Range   Specimen Description BLOOD RIGHT ARM    Special Requests      BOTTLES DRAWN AEROBIC ONLY Blood Culture adequate volume   Culture      NO GROWTH < 24 HOURS Performed at [facility], [place] [address] [place], [place] [address]    Report Status PENDING   Lactic acid, plasma     Status: None   Collection Time: 10/17/18  8:38 PM  Result Value Ref Range   Lactic Acid, Venous 1.3 0.5 - 1.9 mmol/L    Comment:  Performed at [facility], [place] [address], [place], [place] [address]  Urinalysis, Routine w reflex microscopic     Status: Abnormal   Collection Time: 10/17/18  9:54 PM  Result Value Ref Range   Color, Urine YELLOW YELLOW   APPearance CLOUDY (A) CLEAR   Specific Gravity, Urine 1.014 1.005 - 1.030   pH 5.0 5.0 - 8.0   Glucose, UA 50 (A) NEGATIVE mg/dL   Hgb urine dipstick LARGE (A) NEGATIVE   Bilirubin Urine NEGATIVE NEGATIVE   Ketones, ur NEGATIVE NEGATIVE mg/dL   Protein, ur 30 (A) NEGATIVE mg/dL   Nitrite NEGATIVE NEGATIVE   Leukocytes,Ua LARGE (A) NEGATIVE   RBC / HPF 21-50 0 - 5 RBC/hpf   WBC, UA >50 (H) 0 - 5 WBC/hpf   Bacteria, UA FEW (A) NONE SEEN   WBC Clumps PRESENT    Hyaline Casts, UA PRESENT     Comment: Performed at [facility] [address] [place], [place] [address]  Sodium, urine, random     Status: None   Collection Time: 10/17/18  9:54 PM  Result Value Ref Range   Sodium, Ur 46 mmol/L    Comment: Performed at [facility], [place] [address] [place], [place] [address]  Creatinine, urine, random     Status: None   Collection Time: 10/17/18  9:54 PM  Result Value Ref Range   Creatinine, Urine 120.47 mg/dL    Comment: Performed at [facility] [address], [place], [place] [address]  Lactic acid, plasma     Status: None   Collection Time: 10/18/18 12:18 AM  Result Value Ref Range   Lactic Acid, Venous 1.5 0.5 - 1.9 mmol/L    Comment: Performed at [facility] [address], [place], [place] [address]  Comprehensive metabolic panel     Status: Abnormal   Collection Time: 10/18/18 12:18 AM  Result Value Ref Range   Sodium 135 135 - 145 mmol/L   Potassium 4.2 3.5 - 5.1 mmol/L   Chloride 112 (H) 98 - 111 mmol/L   CO2 15 (L) 22 - 32 mmol/L   Glucose, Bld 105 (H) 70 - 99 mg/dL   BUN 38 (H) 6 - 20 mg/dL   Creatinine, Ser 1.93 (H) 0.61 - 1.24 mg/dL   Calcium 7.6 (L) 8.9 - 10.3 mg/dL   Total Protein 5.4 (L) 6.5 - 8.1 g/dL    Albumin 1.9 (L) 3.5 - 5.0 g/dL   AST 44 (H) 15 - 41 U/L   ALT 42 0 - 44 U/L   Alkaline Phosphatase 48 38 - 126 U/L   Total Bilirubin 0.6 0.3 - 1.2 mg/dL   GFR calc non Af Amer 37 (L) >60 mL/min   GFR calc Af Amer 43 (L) >60 mL/min   Anion gap 8 5 - 15    Comment: Performed at [facility], [place] [address] [place], [place] [address]  CBC     Status: Abnormal   Collection Time: 10/18/18  8:42 AM  Result Value Ref Range   WBC 17.0 (H) 4.0 - 10.5 K/uL   RBC 2.56 (L) 4.22 - 5.81 MIL/uL   Hemoglobin 7.1 (L) 13.0 - 17.0 g/dL    Comment: REPEATED TO VERIFY POST TRANSFUSION SPECIMEN    HCT 22.7 (L) 39.0 - 52.0 %   MCV 88.7 80.0 - 100.0 fL   MCH 27.7 26.0 - 34.0 pg   MCHC 31.3 30.0 - 36.0 g/dL   RDW 14.2 11.5 - 15.5 %   Platelets 351 150 - 400 K/uL   nRBC 0.0 0.0 - 0.2 %    Comment: Performed at [facility], [place] [address], [place], [place] [address]  Prepare RBC     Status: None   Collection Time: 10/18/18 12:25 PM  Result Value Ref Range   Order Confirmation      ORDER PROCESSED BY BLOOD BANK Performed at [facility], [place] [address], [place], [place] [address]     Dg Chest 2 View  Result Date: 10/17/2018 CLINICAL DATA:  Weakness EXAM: CHEST - 2 VIEW COMPARISON:  08/13/2018 FINDINGS: The heart size and mediastinal contours are within normal limits. Both lungs are clear. Disc degenerative disease of the thoracic spine. IMPRESSION: No acute abnormality of the lungs.  No focal airspace opacity. Electronically Signed   By: Eddie Candle M.D.   On: 10/17/2018 19:00   US Renal  Result Date: 10/18/2018 CLINICAL DATA:  Acute kidney injury and sepsis EXAM: RENAL / URINARY TRACT ULTRASOUND COMPLETE COMPARISON:  08/13/2018 abdominal CT FINDINGS: Right Kidney: Renal measurements: 11 x 5 x 5 cm = volume: 160 mL . Echogenicity within normal  limits. No mass or hydronephrosis visualized. Left Kidney: Renal measurements: 10.5 x 5 x 4 cm = volume: 114 mL. Echogenicity within normal  limits. No mass or hydronephrosis visualized. Bladder: Prominent bladder wall thickness although decompressed around a Foley catheter. The same appearance was seen on prior CT. IMPRESSION: 1. Negative sonography of the kidneys. 2. Decompressed thick walled bladder. Electronically Signed   By: Monte Fantasia M.D.   On: 10/18/2018 09:13    ROS negative except above Blood pressure 129/67, pulse 84, temperature 97.9 F (36.6 C), temperature source Oral, resp. rate 18, height 6' (1.829 m), weight 79.4 kg, SpO2 100 %. Physical Exam vital signs stable afebrile no acute distress exam pertinent for his abdomen remains soft nontender good bowel sounds labs reviewed increased BUN and creatinine questionable etiology renal ultrasound okay  Assessment/Plan: Bright red blood per rectum probably due to residual proctitis Plan: We will proceed with a flex sig tomorrow midday and in case he needs anesthesia will have clear liquids only before 8 AM and then n.p.o. but he needs probably GU consult and radiation oncology consult and wound team to help determine a treatment plan going forward  [facility] E 10/18/2018, 4:44 PM

## 2018-10-18 NOTE — Progress Notes (Signed)
RT set up cpap and placed on patient. Patient tolerating well at this time. RT will monitor as needed. 

## 2018-10-18 NOTE — Progress Notes (Addendum)
[facility] PROGRESS NOTE  ATOM SOLIVAN KCL:[id number] DOB: November 08, 1960 DOA: 10/17/2018 PCP: [place]  Assessment/Plan: 1. Symptomatic anemia; rectal bleeding. Patient expelled large dark blood clot from rectum 3 days ago. Reports intermittent passage of blood clots per rectum. He was found to have Hgb of 5.1, down from 8.5 a month ago. Transfused 2 units PRBC's and Hg now 7.1. no further bleeding per rectum. Denies blood from colostomy or dark stool. Concern for  recurrent bleeding from rectal ulcer . -will transfuse 2 more units PRBC's -have requested gi consult -serial cbc's  2. SIRS  Resolved this am. Presented 10/17/18 with gen weakness and subjective fevers/chills. Found to be tachycardic with marked leukocytosis and elevated lactate.  No evidence for infection on CXR, abd exam benign.  UA concerning for UTI in setting of chronic foley. Blood cultures with no growth to date. Of note hospitalized 08/2018 with sepsis secondary to perforated rectal ulcer with prostatitis/epididymitis and penoscrotal abscess along with  lenta bacteremia. Finished antibiotics.  -Follow urine culture.  -Narrow antibiotics to rocephin.  -monitor vs closely    3. Acute kidney injury SCr is 2.47 on admission, up from 1.0 a month earlier. Likely prerenal azotemia in setting of blood loss and apparent hypovolemia. Renal US without acute abnormality. Creatinine trending downward this am.  -continue IV fluids -hold nephrotoxins -monitor urine output -recheck in am    4. Hypertension  Fair control. Home meds include lopressor, minipress, amlodipine -continue to hold home anti-hypertensive meds until indicated   5. UTI. Urinalysis concerning for UTI. Patient with chronic foley due to open wounds on scrotum and penis.  -follow urine culture -rocephin -gentle IV fluids   Symptomatic anemia and underlying co-morbidities put patient at increased risk of adverse outcomes and cannot be safely treated  as outpatient. Will require aggressive transfusions and monitoring for response to treatment    Code Status: full Family Communication: non present Disposition Plan: home   Consultants:  magod gi  Procedures:    Antibiotics:  Flagyl  Vancomycin 3/8-3/9  Cefepime 3/8-3/9  Rocephin 3/9>>  HPI/Subjective: 58 yo hx prostate cancer status post radiation complicated by perforated rectal ulcer with prostatitis/epididmitis and penoscrotal abscess and bacteremia admitted with symptomatic anemia. Hg 5.1.   Objective: Vitals:   10/18/18 0630 10/18/18 0954  BP: 130/77 137/68  Pulse: 74 79  Resp: 16 18  Temp: 98.2 F (36.8 C) 98.3 F (36.8 C)  SpO2: 100% 100%    Intake/Output Summary (Last 24 hours) at 10/18/2018 1237 Last data filed at 10/18/2018 0900 Gross per 24 hour  Intake 3259.5 ml  Output 550 ml  Net 2709.5 ml   Filed Weights   10/17/18 2100  Weight: 79.4 kg    Exam:   General:  Sitting up in bed eating. No acute distress  Cardiovascular: rrr no mgr no LE edema  Respiratory: normal effort BS clear bilaterally no wheeze  Abdomen: LLQ colostomy bag intact. Stoma pink/round  Musculoskeletal: joints without swelling/erythema   Data Reviewed: Basic Metabolic Panel: Recent Labs  Lab 10/17/18 1920 10/18/18 0018  NA 134* 135  K 4.5 4.2  CL 106 112*  CO2 18* 15*  GLUCOSE 105* 105*  BUN 45* 38*  CREATININE 2.47* 1.93*  CALCIUM 8.7* 7.6*   Liver Function Tests: Recent Labs  Lab 10/17/18 1920 10/18/18 0018  AST 65* 44*  ALT 50* 42  ALKPHOS 57 48  BILITOT 0.6 0.6  PROT 6.9 5.4*  ALBUMIN 2.3* 1.9*   No results for  input(s): LIPASE, AMYLASE in the last 168 hours. No results for input(s): AMMONIA in the last 168 hours. CBC: Recent Labs  Lab 10/17/18 1920 10/18/18 0842  WBC 24.1* 17.0*  NEUTROABS 20.9*  --   HGB 5.1* 7.1*  HCT 17.6* 22.7*  MCV 91.7 88.7  PLT 463* 351   Cardiac Enzymes: Recent Labs  Lab 10/17/18 1920  TROPONINI  <0.03   BNP (last 3 results) Recent Labs    08/18/18 0359  BNP 219.6*    ProBNP (last 3 results) No results for input(s): PROBNP in the last 8760 hours.  CBG: No results for input(s): GLUCAP in the last 168 hours.  Recent Results (from the past 240 hour(s))  Culture, blood (Routine X 2) w Reflex to ID Panel     Status: None (Preliminary result)   Collection Time: 10/17/18  8:28 PM  Result Value Ref Range Status   Specimen Description BLOOD RIGHT ARM  Final   Special Requests   Final    BOTTLES DRAWN AEROBIC AND ANAEROBIC Blood Culture adequate volume   Culture   Final    NO GROWTH < 12 HOURS Performed at [facility], [address] [place], [place] [address]    Report Status PENDING  Incomplete  Culture, blood (Routine X 2) w Reflex to ID Panel     Status: None (Preliminary result)   Collection Time: 10/17/18  8:28 PM  Result Value Ref Range Status   Specimen Description BLOOD RIGHT ARM  Final   Special Requests   Final    BOTTLES DRAWN AEROBIC ONLY Blood Culture adequate volume   Culture   Final    NO GROWTH < 12 HOURS Performed at [facility], [place] [address] [place], [place] [address]    Report Status PENDING  Incomplete     Studies: Dg Chest 2 View  Result Date: 10/17/2018 CLINICAL DATA:  Weakness EXAM: CHEST - 2 VIEW COMPARISON:  08/13/2018 FINDINGS: The heart size and mediastinal contours are within normal limits. Both lungs are clear. Disc degenerative disease of the thoracic spine. IMPRESSION: No acute abnormality of the lungs.  No focal airspace opacity. Electronically Signed   By: Eddie Candle M.D.   On: 10/17/2018 19:00   US Renal  Result Date: 10/18/2018 CLINICAL DATA:  Acute kidney injury and sepsis EXAM: RENAL / URINARY TRACT ULTRASOUND COMPLETE COMPARISON:  08/13/2018 abdominal CT FINDINGS: Right Kidney: Renal measurements: 11 x 5 x 5 cm = volume: 160 mL . Echogenicity within normal limits. No mass or hydronephrosis visualized. Left Kidney:  Renal measurements: 10.5 x 5 x 4 cm = volume: 114 mL. Echogenicity within normal limits. No mass or hydronephrosis visualized. Bladder: Prominent bladder wall thickness although decompressed around a Foley catheter. The same appearance was seen on prior CT. IMPRESSION: 1. Negative sonography of the kidneys. 2. Decompressed thick walled bladder. Electronically Signed   By: Monte Fantasia M.D.   On: 10/18/2018 09:13    Scheduled Meds: . sodium chloride   Intravenous Once  . sodium chloride flush  3 mL Intravenous Q12H  . sodium chloride flush  3 mL Intravenous Q12H   Continuous Infusions: . sodium chloride 10 mL (10/17/18 2123)  . cefTRIAXone (ROCEPHIN)  IV      Principal Problem:   Symptomatic anemia Active Problems:   AKI (acute kidney injury) ([place])   SIRS (systemic inflammatory response syndrome) ([facility])   HTN (hypertension)   Prostate cancer ([facility])   Rectal bleeding   OSA (obstructive  sleep apnea)   UTI (urinary tract infection)   Rectal ulceration    Time spent: 5 minutes    [place] NP  [facility]  If 7PM-7AM, please contact night-coverage at [email or web address], password [facility] 10/18/2018, 12:37 PM  LOS: 0 days

## 2018-10-18 NOTE — Progress Notes (Signed)
Patient stated he will place himself on CPAP when he is ready for bed. RT will monitor as needed.

## 2018-10-18 NOTE — ED Provider Notes (Signed)
[facility] Provider Note   CSN: [id number] Arrival date & time: 10/17/18  1647    History   Chief Complaint Chief Complaint  Patient presents with  . Weakness    HPI Phillip Blackwell is a 58 y.o. male.     Patient brought in by POV for generalized weakness.  Gradually getting worse since Friday.  Patient's had poor p.o. intake.  Now he is so weak that he cannot walk.  Patient had an admission the beginning of February at [facility] long for marked anemia due to blood passage from the rectum he still has that occasionally but has not had any in 2 days.  Is believed to be due to the radiation treatment that he had for prostate cancer.  Patient does have a colostomy but does have a rectal stump.  Patient denies any fevers or upper respiratory infection symptoms.  No abdominal pain.     Past Medical History:  Diagnosis Date  . Anxiety   . Cancer ([place]) 2016  . Depression   . GERD (gastroesophageal reflux disease)   . Hypertension   . Rectal bleeding 07/07/2018  . Rectal ulceration   . Sleep apnea   . Stroke Phillip Blackwell [facility])    2014    Patient Active Problem List   Diagnosis Date Noted  . UTI (urinary tract infection) 10/18/2018  . Acute blood loss anemia 10/18/2018  . Rectal ulceration   . Symptomatic anemia 10/17/2018  . Rectal bleeding 10/17/2018  . Anemia   . Malnutrition of moderate degree 08/19/2018  . SIRS (systemic inflammatory response syndrome) ([place]) 08/18/2018  . Severe sepsis ([place]) 08/14/2018  . AKI (acute kidney injury) ([place]) 08/14/2018  . Serum total bilirubin elevated 08/14/2018  . Hematochezia 08/14/2018  . Hyponatremia 08/14/2018  . Normal anion gap metabolic acidosis 50/35/4656  . 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 OSA (obstructive sleep apnea) 08/14/2018  . Lower GI bleed 07/07/2018  . HTN (hypertension) 07/07/2018  . HLD (hyperlipidemia) 07/07/2018  . Prostate cancer ([place]) 07/07/2018    Past Surgical History:  Procedure Laterality  Date  . BIOPSY  07/10/2018   Procedure: BIOPSY;  Surgeon: Otis Brace, MD;  Location: [place];  Service: Gastroenterology;;  . Consuela Mimes N/A 08/18/2018   Procedure: CYSTOSCOPY;  Surgeon: Alexis Frock, MD;  Location: [place];  Service: Urology;  Laterality: N/A;  . FLEXIBLE SIGMOIDOSCOPY N/A 07/10/2018   Procedure: FLEXIBLE SIGMOIDOSCOPY;  Surgeon: Otis Brace, MD;  Location: [place];  Service: Gastroenterology;  Laterality: N/A;  Adult EGD Scope. Peds biopsy forceps.  Marland Kitchen HERNIA REPAIR    . IRRIGATION AND DEBRIDEMENT ABSCESS N/A 08/18/2018   Procedure: IRRIGATION AND DEBRIDEMENT penal scrotal ABSCESS;  Surgeon: Alexis Frock, MD;  Location: [place];  Service: Urology;  Laterality: N/A;  . IRRIGATION AND DEBRIDEMENT ABSCESS N/A 09/01/2018   Procedure: IRRIGATION AND DEBRIDEMENT ABSCESS;  Surgeon: Alexis Frock, MD;  Location: [place];  Service: Urology;  Laterality: N/A;  . LAPAROSCOPIC PARTIAL COLECTOMY N/A 08/23/2018   Procedure: LAPAROSCOPIC  END COLOSTOMY CREATION;  Surgeon: Donnie Mesa, MD;  Location: [place];  Service: General;  Laterality: N/A;  . SCROTAL EXPLORATION N/A 08/23/2018   Procedure: SECONDARY PENILE/SCROTUM DEBRIDEMENT;  Surgeon: Alexis Frock, MD;  Location: [place];  Service: Urology;  Laterality: N/A;  . SCROTAL EXPLORATION N/A 08/26/2018   Procedure: THIRD STAGE PENIS AND SCROTAL IRRIGATION AND  DEBRIDEMENT;  Surgeon: Alexis Frock, MD;  Location: [place];  Service: Urology;  Laterality: N/A;  Home Medications    Prior to Admission medications   Medication Sig Start Date End Date Taking? Authorizing Provider  atorvastatin (LIPITOR) 40 MG tablet Take 40 mg by mouth daily with supper.    Yes [provider]  chlorproMAZINE (THORAZINE) 25 MG tablet Take 25 mg by mouth at bedtime.   Yes [provider]  Cholecalciferol (VITAMIN D3) 2000 units TABS Take 2,000 Units by mouth daily.    Yes [provider]  clonazePAM  (KLONOPIN) 1 MG tablet Take 1 mg by mouth at bedtime.    Yes [provider]  DULoxetine (CYMBALTA) 60 MG capsule Take 60 mg by mouth daily with supper.    Yes [provider]  ferrous sulfate 325 (65 FE) MG tablet Take 1 tablet (325 mg total) by mouth daily with breakfast. 07/11/18  Yes Thurnell Lose, MD  gabapentin (NEURONTIN) 300 MG capsule Take 300 mg by mouth every 6 (six) hours.   Yes [provider]  LACTOBACILLUS PO Take 1 capsule by mouth daily.    Yes [provider]  metoprolol tartrate (LOPRESSOR) 100 MG tablet Take 100 mg by mouth daily.    Yes [provider]  Multiple Vitamins-Minerals (MULTIVITAMIN WITH MINERALS) tablet Take 1 tablet by mouth daily.   Yes [provider]  omeprazole (PRILOSEC) 40 MG capsule Take 40 mg by mouth daily before breakfast.    Yes [provider]  polycarbophil (FIBERCON) 625 MG tablet Take 625 mg by mouth daily.   Yes [provider]  prazosin (MINIPRESS) 1 MG capsule Take 1 mg by mouth at bedtime.    Yes [provider]  traMADol (ULTRAM) 50 MG tablet Take 50 mg by mouth every 6 (six) hours.   Yes [provider]  amLODipine (NORVASC) 10 MG tablet Take 10 mg by mouth daily.    [provider]  Gauze Pads & Dressings (DRESSING SPONGES) 4"X4" PADS Provide 1 month supply for daily wet and dry dressing - along with sterile Saline bottle. 09/03/18   Thurnell Lose, MD  oxyCODONE (OXY IR/ROXICODONE) 5 MG immediate release tablet Take 1 tablet (5 mg total) by mouth 3 (three) times daily as needed for moderate pain (unrelieved by Hydrocodone). Patient not taking: Reported on 10/17/2018 09/18/18   Kayleen Memos, DO    Family History Family History  Problem Relation Age of Onset  . Alzheimer's disease Mother     Social History Social History   Tobacco Use  . Smoking status: Never Smoker  . Smokeless tobacco: Never Used  Substance Use Topics  . Alcohol  use: No  . Drug use: No     Allergies   Patient has no known allergies.   Review of Systems Review of Systems  Constitutional: Positive for appetite change and fatigue. Negative for chills and fever.  HENT: Negative for rhinorrhea and sore throat.   Eyes: Negative for visual disturbance.  Respiratory: Negative for cough and shortness of breath.   Cardiovascular: Negative for chest pain and leg swelling.  Gastrointestinal: Positive for anal bleeding. Negative for abdominal pain, diarrhea, nausea and vomiting.  Genitourinary: Negative for dysuria.  Musculoskeletal: Negative for back pain and neck pain.  Skin: Negative for rash.  Neurological: Positive for weakness. Negative for dizziness, light-headedness and headaches.  Hematological: Does not bruise/bleed easily.  Psychiatric/Behavioral: Negative for confusion.     Physical Exam Updated Vital Signs BP (!) 146/70   Pulse 89   Temp 98.7 F (37.1 C) (Oral)  Resp 18   Ht 1.829 m (6')   Wt 79.4 kg   SpO2 100%   BMI 23.73 kg/m   Physical Exam Vitals signs and nursing note reviewed.  Constitutional:      Appearance: He is well-developed.  HENT:     Head: Normocephalic and atraumatic.     Mouth/Throat:     Mouth: Mucous membranes are dry.  Eyes:     Conjunctiva/sclera: Conjunctivae normal.  Neck:     Musculoskeletal: Normal range of motion and neck supple.  Cardiovascular:     Rate and Rhythm: Regular rhythm. Tachycardia present.     Heart sounds: No murmur.  Pulmonary:     Effort: Pulmonary effort is normal. No respiratory distress.     Breath sounds: Normal breath sounds.  Abdominal:     Palpations: Abdomen is soft.     Tenderness: There is no abdominal tenderness.     Comments: Colostomy the left side of abdomen.  Genitourinary:    Comments: Foley catheter in penis.  Does have some lesions around the penis.  No evidence of any blood from the rectal area. Musculoskeletal: Normal range of motion.         General: No swelling.  Skin:    General: Skin is warm and dry.     Coloration: Skin is pale.  Neurological:     General: No focal deficit present.     Mental Status: He is alert and oriented to person, place, and time.      ED Treatments / Results  Labs (all labs ordered are listed, but only abnormal results are displayed) Labs Reviewed  URINALYSIS, ROUTINE W REFLEX MICROSCOPIC - Abnormal; Notable for the following components:      Result Value   APPearance CLOUDY (*)    Glucose, UA 50 (*)    Hgb urine dipstick LARGE (*)    Protein, ur 30 (*)    Leukocytes,Ua LARGE (*)    WBC, UA >50 (*)    Bacteria, UA FEW (*)    All other components within normal limits  COMPREHENSIVE METABOLIC PANEL - Abnormal; Notable for the following components:   Sodium 134 (*)    CO2 18 (*)    Glucose, Bld 105 (*)    BUN 45 (*)    Creatinine, Ser 2.47 (*)    Calcium 8.7 (*)    Albumin 2.3 (*)    AST 65 (*)    ALT 50 (*)    GFR calc non Af Amer 27 (*)    GFR calc Af Amer 32 (*)    All other components within normal limits  CBC WITH DIFFERENTIAL/PLATELET - Abnormal; Notable for the following components:   WBC 24.1 (*)    RBC 1.92 (*)    Hemoglobin 5.1 (*)    HCT 17.6 (*)    MCHC 29.0 (*)    Platelets 463 (*)    Neutro Abs 20.9 (*)    Monocytes Absolute 1.6 (*)    Abs Immature Granulocytes 0.20 (*)    All other components within normal limits  LACTIC ACID, PLASMA - Abnormal; Notable for the following components:   Lactic Acid, Venous 3.1 (*)    All other components within normal limits  COMPREHENSIVE METABOLIC PANEL - Abnormal; Notable for the following components:   Chloride 112 (*)    CO2 15 (*)    Glucose, Bld 105 (*)    BUN 38 (*)    Creatinine, Ser 1.93 (*)    Calcium 7.6 (*)  Total Protein 5.4 (*)    Albumin 1.9 (*)    AST 44 (*)    GFR calc non Af Amer 37 (*)    GFR calc Af Amer 43 (*)    All other components within normal limits  CBC - Abnormal; Notable for the following  components:   WBC 17.0 (*)    RBC 2.56 (*)    Hemoglobin 7.1 (*)    HCT 22.7 (*)    All other components within normal limits  CULTURE, BLOOD (ROUTINE X 2)  CULTURE, BLOOD (ROUTINE X 2)  URINE CULTURE  TROPONIN I  LACTIC ACID, PLASMA  LACTIC ACID, PLASMA  SODIUM, URINE, RANDOM  CREATININE, URINE, RANDOM  BASIC METABOLIC PANEL  CBC  PREPARE RBC (CROSSMATCH)  TYPE AND SCREEN  PREPARE RBC (CROSSMATCH)    EKG EKG Interpretation  Date/Time:  Sunday October 17 2018 20:46:21 EDT Ventricular Rate:  92 PR Interval:    QRS Duration: 97 QT Interval:  349 QTC Calculation: 432 R Axis:   87 Text Interpretation:  Sinus rhythm Confirmed by Fredia Sorrow [phone]) on 10/17/2018 8:59:50 PM Also confirmed by Fredia Sorrow [phone]), editor Philomena Doheny [phone])  on 10/18/2018 7:59:11 AM   Radiology Dg Chest 2 View  Result Date: 10/17/2018 CLINICAL DATA:  Weakness EXAM: CHEST - 2 VIEW COMPARISON:  08/13/2018 FINDINGS: The heart size and mediastinal contours are within normal limits. Both lungs are clear. Disc degenerative disease of the thoracic spine. IMPRESSION: No acute abnormality of the lungs.  No focal airspace opacity. Electronically Signed   By: Eddie Candle M.D.   On: 10/17/2018 19:00   US Renal  Result Date: 10/18/2018 CLINICAL DATA:  Acute kidney injury and sepsis EXAM: RENAL / URINARY TRACT ULTRASOUND COMPLETE COMPARISON:  08/13/2018 abdominal CT FINDINGS: Right Kidney: Renal measurements: 11 x 5 x 5 cm = volume: 160 mL . Echogenicity within normal limits. No mass or hydronephrosis visualized. Left Kidney: Renal measurements: 10.5 x 5 x 4 cm = volume: 114 mL. Echogenicity within normal limits. No mass or hydronephrosis visualized. Bladder: Prominent bladder wall thickness although decompressed around a Foley catheter. The same appearance was seen on prior CT. IMPRESSION: 1. Negative sonography of the kidneys. 2. Decompressed thick walled bladder. Electronically Signed   By: Monte Fantasia  M.D.   On: 10/18/2018 09:13    Procedures Procedures (including critical care time)  CRITICAL CARE Performed by: Fredia Sorrow Total critical care time: 30 minutes Critical care time was exclusive of separately billable procedures and treating other patients. Critical care was necessary to treat or prevent imminent or life-threatening deterioration. Critical care was time spent personally by me on the following activities: development of treatment plan with patient and/or surrogate as well as nursing, discussions with consultants, evaluation of patient's response to treatment, examination of patient, obtaining history from patient or surrogate, ordering and performing treatments and interventions, ordering and review of laboratory studies, ordering and review of radiographic studies, pulse oximetry and re-evaluation of patient's condition.   Medications Ordered in ED Medications  sodium chloride flush (NS) 0.9 % injection 3 mL (3 mLs Intravenous Given 10/18/18 1026)  sodium chloride flush (NS) 0.9 % injection 3 mL (3 mLs Intravenous Given 10/18/18 1026)  sodium chloride flush (NS) 0.9 % injection 3 mL (has no administration in time range)  0.9 %  sodium chloride infusion (10 mLs Intravenous New Bag/Given 10/17/18 2123)  acetaminophen (TYLENOL) tablet 650 mg (has no administration in time range)    Or  acetaminophen (  TYLENOL) suppository 650 mg (has no administration in time range)  HYDROcodone-acetaminophen (NORCO/VICODIN) 5-325 MG per tablet 1-2 tablet (2 tablets Oral Given 10/18/18 1024)  ondansetron (ZOFRAN) tablet 4 mg (has no administration in time range)    Or  ondansetron (ZOFRAN) injection 4 mg (has no administration in time range)  cefTRIAXone (ROCEPHIN) 1 g in sodium chloride 0.9 % 100 mL IVPB (1 g Intravenous New Bag/Given 10/18/18 1321)  0.9 %  sodium chloride infusion ( Intravenous New Bag/Given 10/18/18 1442)  sodium chloride 0.9 % bolus 1,000 mL (0 mLs Intravenous Stopped 10/17/18  2227)  0.9 %  sodium chloride infusion (10 mL/hr Intravenous New Bag/Given 10/18/18 0123)  ceFEPIme (MAXIPIME) 2 g in sodium chloride 0.9 % 100 mL IVPB (0 g Intravenous Stopped 10/18/18 0619)  sodium chloride 0.9 % bolus 1,000 mL (0 mLs Intravenous Stopped 10/18/18 0620)    And  sodium chloride 0.9 % bolus 1,000 mL (0 mLs Intravenous Stopped 10/18/18 0620)    And  sodium chloride 0.9 % bolus 500 mL (0 mLs Intravenous Stopped 10/17/18 2230)  vancomycin (VANCOCIN) 1,500 mg in sodium chloride 0.9 % 500 mL IVPB (1,500 mg Intravenous New Bag/Given 10/17/18 2357)  0.9 %  sodium chloride infusion (Manually program via Guardrails IV Fluids) ( Intravenous New Bag/Given 10/18/18 1023)     Initial Impression / Assessment and Plan / ED Course  I have reviewed the triage vital signs and the nursing notes.  Pertinent labs & imaging results that were available during my care of the patient were reviewed by me and considered in my medical decision making (see chart for details).       Patient with Foley catheter colostomy history of prostate cancer and received radiation treatment.  Has been struggling for the past month or longer with intermittent bleeding from the rectal stump.  Patient was admitted February 6 for bleeding from wound.  Had coagulation disorder at that time.  He was admitted at [facility].  Was transfused.  Today presenting for generalized weakness gradual onset since Friday.  Not feeling well to the point now unable to stand.  Does report some blood clots from the rectum but none in the last 2 days.  Work-up showed a significant anemia.  With a hemoglobin of 5.1.  For this patient will require blood transfusion.  Initially ordered 2 units of packed red blood cells.  Patient also had significant leukocytosis.  Also with markedly elevated BUN and creatinine suggestive of acute kidney injury.  Chest x-ray without evidence of any infectious process.  Patient's troponin was negative.  Patient did meet  sepsis criteria did receive some fluids but did not require the full 30 cc/kg fluid challenge.  But lactic acid was 1.3.  Patient did receive broad-spectrum antibiotics just to be complete.  Patient without fever.  Respiratory rate was up at 32 heart rate mostly in the upper 90s but occasionally would go over 100.  Blood pressure was 114/65.  No fever.  Is a soft call for meeting sepsis criteria but just due to his overall presentation and the markedly elevated white blood cell count.  Seemed appropriate to cover him initially with broad-spectrum antibiotics.  Discussed with hospitalist they will admit.     Final Clinical Impressions(s) / ED Diagnoses   Final diagnoses:  AKI (acute kidney injury) ([place])  Anemia, unspecified type  Rectal bleeding  Sepsis, due to unspecified organism, unspecified whether acute organ dysfunction present [facility])    ED Discharge Orders  None       Fredia Sorrow, MD 10/18/18 [phone]

## 2018-10-19 ENCOUNTER — Encounter (HOSPITAL_COMMUNITY): Payer: Self-pay | Admitting: *Deleted

## 2018-10-19 ENCOUNTER — Encounter (HOSPITAL_COMMUNITY)
Admission: EM | Disposition: A | Discharge status: Home / Self Care | Payer: Self-pay | Source: Home / Self Care | Attending: Internal Medicine

## 2018-10-19 DIAGNOSIS — C61 Malignant neoplasm of prostate: Secondary | ICD-10-CM

## 2018-10-19 DIAGNOSIS — R6510 Systemic inflammatory response syndrome (SIRS) of non-infectious origin without acute organ dysfunction: Secondary | ICD-10-CM

## 2018-10-19 DIAGNOSIS — K626 Ulcer of anus and rectum: Secondary | ICD-10-CM

## 2018-10-19 DIAGNOSIS — G4733 Obstructive sleep apnea (adult) (pediatric): Secondary | ICD-10-CM

## 2018-10-19 HISTORY — PX: FLEXIBLE SIGMOIDOSCOPY: SHX5431

## 2018-10-19 LAB — CBC
HEMATOCRIT: 28.9 % — AB (ref 39.0–52.0)
Hemoglobin: 9.3 g/dL — ABNORMAL LOW (ref 13.0–17.0)
MCH: 27.8 pg (ref 26.0–34.0)
MCHC: 32.2 g/dL (ref 30.0–36.0)
MCV: 86.3 fL (ref 80.0–100.0)
Platelets: 363 10*3/uL (ref 150–400)
RBC: 3.35 MIL/uL — ABNORMAL LOW (ref 4.22–5.81)
RDW: 15.2 % (ref 11.5–15.5)
WBC: 18.6 10*3/uL — ABNORMAL HIGH (ref 4.0–10.5)
nRBC: 0 % (ref 0.0–0.2)

## 2018-10-19 LAB — TYPE AND SCREEN
ABO/RH(D): O POS
Antibody Screen: NEGATIVE
Unit division: 0
Unit division: 0
Unit division: 0
Unit division: 0

## 2018-10-19 LAB — BPAM RBC
BLOOD PRODUCT EXPIRATION DATE: 202004042359
Blood Product Expiration Date: 202004062359
Blood Product Expiration Date: 202004082359
Blood Product Expiration Date: 202004082359
ISSUE DATE / TIME: 202003090057
ISSUE DATE / TIME: 202003090351
ISSUE DATE / TIME: 202003091301
ISSUE DATE / TIME: 202003091625
UNIT TYPE AND RH: 5100
UNIT TYPE AND RH: 5100
Unit Type and Rh: 5100
Unit Type and Rh: 5100

## 2018-10-19 LAB — BASIC METABOLIC PANEL
Anion gap: 9 (ref 5–15)
BUN: 17 mg/dL (ref 6–20)
CO2: 18 mmol/L — ABNORMAL LOW (ref 22–32)
Calcium: 8.4 mg/dL — ABNORMAL LOW (ref 8.9–10.3)
Chloride: 109 mmol/L (ref 98–111)
Creatinine, Ser: 1.34 mg/dL — ABNORMAL HIGH (ref 0.61–1.24)
GFR calc Af Amer: >60 mL/min (ref >60)
GFR calc non Af Amer: 58 mL/min — ABNORMAL LOW (ref >60)
Glucose, Bld: 98 mg/dL (ref 70–99)
Potassium: 4.1 mmol/L (ref 3.5–5.1)
Sodium: 136 mmol/L (ref 135–145)

## 2018-10-19 SURGERY — SIGMOIDOSCOPY, FLEXIBLE
Anesthesia: Moderate Sedation

## 2018-10-19 MED ORDER — TRAMADOL HCL 50 MG PO TABS
50.0000 mg | ORAL_TABLET | Freq: Four times a day (QID) | ORAL | Status: DC
  Administered 2018-10-19 – 2018-10-20 (×4): 50 mg via ORAL
  Filled 2018-10-19 (×4): qty 1

## 2018-10-19 MED ORDER — SODIUM CHLORIDE 0.9 % IV SOLN: INTRAVENOUS | Status: DC

## 2018-10-19 MED ORDER — MIDAZOLAM HCL (PF) 10 MG/2ML IJ SOLN
INTRAMUSCULAR | Status: DC | PRN
  Administered 2018-10-19: 2 mg via INTRAVENOUS

## 2018-10-19 MED ORDER — FENTANYL CITRATE (PF) 100 MCG/2ML IJ SOLN
INTRAMUSCULAR | Status: AC
  Filled 2018-10-19: qty 2

## 2018-10-19 MED ORDER — PANTOPRAZOLE SODIUM 40 MG PO TBEC
40.0000 mg | DELAYED_RELEASE_TABLET | Freq: Every day | ORAL | Status: DC
  Administered 2018-10-19 – 2018-10-20 (×2): 40 mg via ORAL
  Filled 2018-10-19 (×2): qty 1

## 2018-10-19 MED ORDER — MIDAZOLAM HCL (PF) 5 MG/ML IJ SOLN
INTRAMUSCULAR | Status: AC
  Filled 2018-10-19: qty 2

## 2018-10-19 MED ORDER — AMLODIPINE BESYLATE 10 MG PO TABS
10.0000 mg | ORAL_TABLET | Freq: Every day | ORAL | Status: DC
  Administered 2018-10-19 – 2018-10-20 (×2): 10 mg via ORAL
  Filled 2018-10-19 (×2): qty 1

## 2018-10-19 MED ORDER — FENTANYL CITRATE (PF) 100 MCG/2ML IJ SOLN
INTRAMUSCULAR | Status: DC | PRN
  Administered 2018-10-19: 50 ug via INTRAVENOUS

## 2018-10-19 MED ORDER — ATORVASTATIN CALCIUM 40 MG PO TABS
40.0000 mg | ORAL_TABLET | Freq: Every day | ORAL | Status: DC
  Administered 2018-10-19: 40 mg via ORAL
  Filled 2018-10-19: qty 1

## 2018-10-19 MED ORDER — CLONAZEPAM 1 MG PO TABS
1.0000 mg | ORAL_TABLET | Freq: Every day | ORAL | Status: DC
  Administered 2018-10-19: 1 mg via ORAL
  Filled 2018-10-19: qty 1

## 2018-10-19 NOTE — Progress Notes (Signed)
Phillip Blackwell 2:17 PM  Subjective: Patient without any new complaints and case discussed with his wife as well  Objective: Vital signs stable afebrile exam please see preassessment evaluation hemoglobin stable white count still increased  Assessment: Bright red blood per rectum in a patient with history of radiation proctitis and significant ulcer  Plan: Okay to proceed with flex sig with moderate sedation with further work-up and plans please see procedure report  [facility] E  Pager [phone] After 5PM or if no answer call [phone]

## 2018-10-19 NOTE — Op Note (Signed)
[facility] Patient Name: Phillip Blackwell Procedure Date : 10/19/2018 MRN: [id number] Attending MD: Clarene Essex , MD Date of Birth: 12/16/1960 CSN: [id number] Age: 58 Admit Type: Inpatient Procedure:                Flexible Sigmoidoscopy Indications:              Hematochezia Providers:                Clarene Essex, MD, Baird Cancer, RN, Cletis [place],                            Technician Referring MD:              Medicines:                Fentanyl 50 micrograms IV, Midazolam 2 mg IV Complications:            No immediate complications. Estimated Blood Loss:     Estimated blood loss: none. Procedure:                Pre-Anesthesia Assessment:                           - Prior to the procedure, a History and Physical                            was performed, and patient medications and                            allergies were reviewed. The patient's tolerance of                            previous anesthesia was also reviewed. The risks                            and benefits of the procedure and the sedation                            options and risks were discussed with the patient.                            All questions were answered, and informed consent                            was obtained. Prior Anticoagulants: The patient has                            taken no previous anticoagulant or antiplatelet                            agents. ASA Grade Assessment: II - A patient with                            mild systemic disease. After reviewing the risks  and benefits, the patient was deemed in                            satisfactory condition to undergo the procedure.                           After obtaining informed consent, the scope was                            passed under direct vision. The [id number]-[id number] ([id number])                            Olympus gastroscope was introduced through the anus                            and advanced to the  the rectum. The flexible                            sigmoidoscopy was accomplished without difficulty.                            The patient tolerated the procedure well. The                            quality of the bowel preparation was adequate. Scope In: Scope Out: Findings:      Multiple ulcers were found in the rectum. No bleeding was present.       Stigmata of recent bleeding were present.      A medium fistula was found in the rectum.      The exam was otherwise without abnormality. Impression:               - Multiple ulcers in the rectum.                           - Colonic fistula.                           - The examination was otherwise normal.                           - No specimens collected. Recommendation:           - Resume regular diet. Based on appearance expect                            bright red blood periodically                           - Continue present medications.                           - Return to GI clinic PRN. Please call me if I                            could be of any further assistance with this  hospital stay                           - Telephone GI clinic if symptomatic PRN.                           - Refer to a radiation oncologist at appointment to                            be scheduled.                           - Refer to a urologist at appointment to be                            scheduled. And get both their opinions about                            hyperbaric oxygen or any other therapy Procedure Code(s):        --- Professional ---                           [phone], 52, Sigmoidoscopy, flexible; diagnostic,                            including collection of specimen(s) by brushing or                            washing, when performed (separate procedure) Diagnosis Code(s):        --- Professional ---                           K62.6, Ulcer of anus and rectum                           K63.2, Fistula of  intestine                           K92.1, Melena (includes Hematochezia) CPT copyright 2018 [employer]. All rights reserved. The codes documented in this report are preliminary and upon coder review may  be revised to meet current compliance requirements. Clarene Essex, MD 10/19/2018 2:51:53 PM This report has been signed electronically. Number of Addenda: 0

## 2018-10-19 NOTE — Progress Notes (Addendum)
PROGRESS NOTE    Phillip Blackwell  WJX:[id number] DOB: 1961-04-03 DOA: 10/17/2018 PCP: [facility]   Brief Narrative:  58 year old with history of prostate cancer status post radiation, hypertension, obstructive sleep apnea, depression, history of symptomatic anemia from bleeding rectal ulcer presented to the emergency department with complaints of generalized weakness fevers and chills.  Patient admitted for symptomatic anemia with rectal bleeding.  Gastroenterology consulted and appreciated, pending flexible sigmoidoscopy.  Patient also found to have acute kidney injury. Assessment & Plan   Symptomatic anemia -Presented with a hemoglobin of 5.1 -Patient was transfused 4 units PRBC, current hemoglobin 9.3 -Gastroenterology consulted and appreciated, planning for flexible sigmoidoscopy today -Continue to monitor CBC  Sepsis secondary to possible urinary tract infection from chronic Foley catheter -Presented with leukocytosis, subjective fevers, tachycardia, tachypnea, elevated lactic acid -UA concerning for urinary tract infection, however patient does have Foley catheter -Urine culture shows >100k GNR, >100K B Streptococcus -Blood culture showed no growth to date -Continue ceftriaxone  Penile/scrotal wounds -consulted wound care however was deferred to urology -continue wound care as instructed by urology upon last visit (recently had appt on 10/11/2018) -Discussed with Dr. Louis Meckel, urology, outpt follow up with general surgery and Dr. Tresa Moore to discuss surgery options  Acute kidney injury -Creatinine on admission was 2.47 -Likely prerenal azotemia in the setting of acute blood loss and hypovolemia -Renal ultrasound unremarkable  Essential hypertension -Medications held- however BP uncontrolled -will resume amlodipine   DVT Prophylaxis  SCDs  Code Status: None  Family Communication: Wife at bedside  Disposition Plan: Admitted. Pending flex sig today and stabilization of  patient's hemoglobin.  Consultants Gastroenterology  Procedures  Renal US  Antibiotics   Anti-infectives (From admission, onward)   Start     Dose/Rate Route Frequency Ordered Stop   10/18/18 2200  vancomycin (VANCOCIN) IVPB 1000 mg/200 mL premix  Status:  Discontinued     1,000 mg 200 mL/hr over 60 Minutes Intravenous Every 24 hours 10/17/18 2136 10/18/18 1132   10/18/18 2100  ceFEPIme (MAXIPIME) 2 g in sodium chloride 0.9 % 100 mL IVPB  Status:  Discontinued     2 g 200 mL/hr over 30 Minutes Intravenous Every 24 hours 10/17/18 2137 10/18/18 1132   10/18/18 1230  [MAR Hold]  cefTRIAXone (ROCEPHIN) 1 g in sodium chloride 0.9 % 100 mL IVPB     (MAR Hold since Tue 10/19/2018 at 1251. Reason: Transfer to a Procedural area.)   1 g 200 mL/hr over 30 Minutes Intravenous Every 24 hours 10/18/18 1133     10/17/18 2045  vancomycin (VANCOCIN) 1,500 mg in sodium chloride 0.9 % 500 mL IVPB     1,500 mg 250 mL/hr over 120 Minutes Intravenous  Once 10/17/18 2042 10/18/18 0157   10/17/18 2030  ceFEPIme (MAXIPIME) 2 g in sodium chloride 0.9 % 100 mL IVPB     2 g 200 mL/hr over 30 Minutes Intravenous  Once 10/17/18 2028 10/18/18 0619   10/17/18 2030  metroNIDAZOLE (FLAGYL) IVPB 500 mg  Status:  Discontinued     500 mg 100 mL/hr over 60 Minutes Intravenous Every 8 hours 10/17/18 2028 10/18/18 1132   10/17/18 2030  vancomycin (VANCOCIN) IVPB 1000 mg/200 mL premix  Status:  Discontinued     1,000 mg 200 mL/hr over 60 Minutes Intravenous  Once 10/17/18 2028 10/17/18 2042      Subjective:   Phillip Blackwell seen and examined today.  Patient has no complaints today.  States he would like to eat  and drink something.  Denies current chest pain, shortness of breath, abdominal pain, nausea or vomiting, headache or dizziness. Objective:   Vitals:   10/18/18 1957 10/18/18 2125 10/19/18 0459 10/19/18 0858  BP: (!) 146/65 (!) 141/73 (!) 161/80 (!) 167/90  Pulse: 83 87 94 96  Resp: (!) 22 18 18 18   Temp:  99.4 F (37.4 C) 100.1 F (37.8 C) 97.8 F (36.6 C) 99.2 F (37.3 C)  TempSrc: Oral Oral  Oral  SpO2: 100% 100% 99% 99%  Weight:  80.2 kg    Height:        Intake/Output Summary (Last 24 hours) at 10/19/2018 1256 Last data filed at 10/19/2018 1100 Gross per 24 hour  Intake 2778.87 ml  Output 3950 ml  Net -1171.13 ml   Filed Weights   10/17/18 2100 10/18/18 2125  Weight: 79.4 kg 80.2 kg    Exam  General: Well developed, well nourished, NAD, appears stated age  HEENT: NCAT, mucous membranes moist.   Neck: Supple  Cardiovascular: S1 S2 auscultated, RRR, no murmur  Respiratory: Clear to auscultation bilaterally with equal chest rise  Abdomen: Soft, nontender, nondistended, + bowel sounds  Extremities: warm dry without cyanosis clubbing or edema  Neuro: AAOx3, nonfocal  Psych: Normal affect and demeanor   Data Reviewed: I have personally reviewed following labs and imaging studies  CBC: Recent Labs  Lab 10/17/18 1920 10/18/18 0842 10/18/18 2217 10/19/18 0516  WBC 24.1* 17.0* 16.8* 18.6*  NEUTROABS 20.9*  --   --   --   HGB 5.1* 7.1* 8.9* 9.3*  HCT 17.6* 22.7* 27.5* 28.9*  MCV 91.7 88.7 86.2 86.3  PLT 463* 351 329 621   Basic Metabolic Panel: Recent Labs  Lab 10/17/18 1920 10/18/18 0018 10/19/18 0516  NA 134* 135 136  K 4.5 4.2 4.1  CL 106 112* 109  CO2 18* 15* 18*  GLUCOSE 105* 105* 98  BUN 45* 38* 17  CREATININE 2.47* 1.93* 1.34*  CALCIUM 8.7* 7.6* 8.4*   GFR: Estimated Creatinine Clearance: 65.1 mL/min (A) (by C-G formula based on SCr of 1.34 mg/dL (H)). Liver Function Tests: Recent Labs  Lab 10/17/18 1920 10/18/18 0018  AST 65* 44*  ALT 50* 42  ALKPHOS 57 48  BILITOT 0.6 0.6  PROT 6.9 5.4*  ALBUMIN 2.3* 1.9*   No results for input(s): LIPASE, AMYLASE in the last 168 hours. No results for input(s): AMMONIA in the last 168 hours. Coagulation Profile: No results for input(s): INR, PROTIME in the last 168 hours. Cardiac  Enzymes: Recent Labs  Lab 10/17/18 1920  TROPONINI <0.03   BNP (last 3 results) No results for input(s): PROBNP in the last 8760 hours. HbA1C: No results for input(s): HGBA1C in the last 72 hours. CBG: No results for input(s): GLUCAP in the last 168 hours. Lipid Profile: No results for input(s): CHOL, HDL, LDLCALC, TRIG, CHOLHDL, LDLDIRECT in the last 72 hours. Thyroid Function Tests: No results for input(s): TSH, T4TOTAL, FREET4, T3FREE, THYROIDAB in the last 72 hours. Anemia Panel: No results for input(s): VITAMINB12, FOLATE, FERRITIN, TIBC, IRON, RETICCTPCT in the last 72 hours. Urine analysis:    Component Value Date/Time   COLORURINE YELLOW 10/17/2018 2154   APPEARANCEUR CLOUDY (A) 10/17/2018 2154   LABSPEC 1.014 10/17/2018 2154   PHURINE 5.0 10/17/2018 2154   GLUCOSEU 50 (A) 10/17/2018 2154   HGBUR LARGE (A) 10/17/2018 2154   BILIRUBINUR NEGATIVE 10/17/2018 2154   BILIRUBINUR negative 09/09/2018 1136   KETONESUR NEGATIVE 10/17/2018 2154  PROTEINUR 30 (A) 10/17/2018 2154   UROBILINOGEN 0.2 09/09/2018 1136   NITRITE NEGATIVE 10/17/2018 2154   LEUKOCYTESUR LARGE (A) 10/17/2018 2154   Sepsis Labs: @LABRCNTIP (procalcitonin:4,lacticidven:4)  ) Recent Results (from the past 240 hour(s))  Culture, blood (Routine X 2) w Reflex to ID Panel     Status: None (Preliminary result)   Collection Time: 10/17/18  8:28 PM  Result Value Ref Range Status   Specimen Description BLOOD RIGHT ARM  Final   Special Requests   Final    BOTTLES DRAWN AEROBIC AND ANAEROBIC Blood Culture adequate volume   Culture   Final    NO GROWTH 2 DAYS Performed at [facility], [place] [address], [place], [place] [address]    Report Status PENDING  Incomplete  Culture, blood (Routine X 2) w Reflex to ID Panel     Status: None (Preliminary result)   Collection Time: 10/17/18  8:28 PM  Result Value Ref Range Status   Specimen Description BLOOD RIGHT ARM  Final   Special Requests   Final     BOTTLES DRAWN AEROBIC ONLY Blood Culture adequate volume   Culture   Final    NO GROWTH 2 DAYS Performed at [facility], [place] [address] [place], [place] [address]    Report Status PENDING  Incomplete  Urine Culture     Status: Abnormal (Preliminary result)   Collection Time: 10/17/18  9:54 PM  Result Value Ref Range Status   Specimen Description URINE, CLEAN CATCH  Final   Special Requests NONE  Final   Culture (A)  Final    >=100,000 COLONIES/mL GRAM NEGATIVE RODS >=100,000 COLONIES/mL GROUP B STREP(S.AGALACTIAE)ISOLATED TESTING AGAINST S. AGALACTIAE NOT ROUTINELY PERFORMED DUE TO PREDICTABILITY OF AMP/PEN/VAN SUSCEPTIBILITY. Performed at [facility], [place] [address], [place], [place] [address]    Report Status PENDING  Incomplete      Radiology Studies: Dg Chest 2 View  Result Date: 10/17/2018 CLINICAL DATA:  Weakness EXAM: CHEST - 2 VIEW COMPARISON:  08/13/2018 FINDINGS: The heart size and mediastinal contours are within normal limits. Both lungs are clear. Disc degenerative disease of the thoracic spine. IMPRESSION: No acute abnormality of the lungs.  No focal airspace opacity. Electronically Signed   By: Eddie Candle M.D.   On: 10/17/2018 19:00   US Renal  Result Date: 10/18/2018 CLINICAL DATA:  Acute kidney injury and sepsis EXAM: RENAL / URINARY TRACT ULTRASOUND COMPLETE COMPARISON:  08/13/2018 abdominal CT FINDINGS: Right Kidney: Renal measurements: 11 x 5 x 5 cm = volume: 160 mL . Echogenicity within normal limits. No mass or hydronephrosis visualized. Left Kidney: Renal measurements: 10.5 x 5 x 4 cm = volume: 114 mL. Echogenicity within normal limits. No mass or hydronephrosis visualized. Bladder: Prominent bladder wall thickness although decompressed around a Foley catheter. The same appearance was seen on prior CT. IMPRESSION: 1. Negative sonography of the kidneys. 2. Decompressed thick walled bladder. Electronically Signed   By: Monte Fantasia M.D.   On:  10/18/2018 09:13     Scheduled Meds: . amLODipine  10 mg Oral Daily  . atorvastatin  40 mg Oral Q supper  . clonazePAM  1 mg Oral QHS  . pantoprazole  40 mg Oral Daily  . [MAR Hold] sodium chloride flush  3 mL Intravenous Q12H  . [MAR Hold] sodium chloride flush  3 mL Intravenous Q12H  . traMADol  50 mg Oral Q6H   Continuous Infusions: . [MAR Hold] sodium chloride 10 mL (  10/17/18 2123)  . sodium chloride 75 mL/hr at 10/19/18 1100  . sodium chloride    . [MAR Hold] cefTRIAXone (ROCEPHIN)  IV 1 g (10/19/18 1230)     LOS: 1 day   Time Spent in minutes   30 minutes  Daimon Kean D.O. on 10/19/2018 at 12:56 PM  Between 7am to 7pm - Please see pager noted on [email or web address]  After 7pm go to [email or web address]  And look for the night coverage person covering for me after hours  [facility] Office  [phone]

## 2018-10-20 ENCOUNTER — Encounter (HOSPITAL_COMMUNITY): Payer: Self-pay | Admitting: Gastroenterology

## 2018-10-20 LAB — CBC
HEMATOCRIT: 33.2 % — AB (ref 39.0–52.0)
HEMOGLOBIN: 10.9 g/dL — AB (ref 13.0–17.0)
MCH: 28.8 pg (ref 26.0–34.0)
MCHC: 32.8 g/dL (ref 30.0–36.0)
MCV: 87.8 fL (ref 80.0–100.0)
Platelets: 369 10*3/uL (ref 150–400)
RBC: 3.78 MIL/uL — ABNORMAL LOW (ref 4.22–5.81)
RDW: 15.2 % (ref 11.5–15.5)
WBC: 18.3 10*3/uL — ABNORMAL HIGH (ref 4.0–10.5)
nRBC: 0 % (ref 0.0–0.2)

## 2018-10-20 LAB — URINE CULTURE: Culture: >=100000 — AB

## 2018-10-20 LAB — BASIC METABOLIC PANEL
ANION GAP: 8 (ref 5–15)
BUN: 10 mg/dL (ref 6–20)
CO2: 20 mmol/L — ABNORMAL LOW (ref 22–32)
Calcium: 9 mg/dL (ref 8.9–10.3)
Chloride: 109 mmol/L (ref 98–111)
Creatinine, Ser: 1.11 mg/dL (ref 0.61–1.24)
GFR calc Af Amer: >60 mL/min (ref >60)
GFR calc non Af Amer: >60 mL/min (ref >60)
Glucose, Bld: 84 mg/dL (ref 70–99)
Potassium: 3.8 mmol/L (ref 3.5–5.1)
Sodium: 137 mmol/L (ref 135–145)

## 2018-10-20 MED ORDER — SULFAMETHOXAZOLE-TRIMETHOPRIM 800-160 MG PO TABS: 1.0000 | ORAL_TABLET | Freq: Two times a day (BID) | ORAL | 0 refills | Status: DC

## 2018-10-20 MED ORDER — SULFAMETHOXAZOLE-TRIMETHOPRIM 800-160 MG PO TABS: 1.0000 | ORAL_TABLET | Freq: Two times a day (BID) | ORAL | 0 refills | Status: AC

## 2018-10-20 MED ORDER — SULFAMETHOXAZOLE-TRIMETHOPRIM 800-160 MG PO TABS
1.0000 | ORAL_TABLET | Freq: Two times a day (BID) | ORAL | Status: DC
  Administered 2018-10-20: 1 via ORAL
  Filled 2018-10-20: qty 1

## 2018-10-20 MED FILL — SULFAMETHOXAZOLE-TMP DS TAB: 800-160 | 5 days supply | Qty: 10 | Fill #0

## 2018-10-20 NOTE — Consult Note (Signed)
[facility] Nurse wound consult note Reason for Consult:Penile and Scrotal wounds post radiation S/P surgical debridement on prior admission Wound type: surgical Pressure Injury POA: NA Measurement: scrotal wound 2cm x 0.5cm x 0.1cm pink wound bed, no drainage noted. Perineal wound 0.8cm x 0.2cm x 0.1cm pink wound bed, no drainage noted. Penile shaft pink wound bed, scant serosanguinous drainage.   Wound bed:see above Drainage (amount, consistency, odor) see above Periwound: intact, slightly edematous Dressing procedure/placement/frequency: Wound has just been dressed by bedside RN. Pt states his wife will be in this morning and he will have her re-dress it like they do at home.  Pt will notify me when she arrives and I will come back to see penile shaft wound. Wife performs wound care at home. Supplies given, will write orders for wound care, order reflects that pt would like his wife to perform. Pt admitted for unrelated issue and will only be in house a short time. Pt having no problem with colostomy, no assistance needed. We will not follow, but will remain available to this patient, to nursing, and the medical and/or surgical teams.  Please re-consult if we need to assist further.  Fara Olden, RN-C, WTA-C, [facility] Associate

## 2018-10-20 NOTE — Progress Notes (Signed)
[name] to be D/C'd Home per MD order.  Discussed prescriptions and follow up appointments with the patient. Prescriptions given to patient, medication list explained in detail. Pt verbalized understanding.  Allergies as of 10/20/2018   No Known Allergies     Medication List    TAKE these medications   amLODipine 10 MG tablet Commonly known as:  NORVASC Take 10 mg by mouth daily.   atorvastatin 40 MG tablet Commonly known as:  LIPITOR Take 40 mg by mouth daily with supper.   chlorproMAZINE 25 MG tablet Commonly known as:  THORAZINE Take 25 mg by mouth at bedtime.   clonazePAM 1 MG tablet Commonly known as:  KLONOPIN Take 1 mg by mouth at bedtime.   Dressing Sponges 4"X4" Pads Provide 1 month supply for daily wet and dry dressing - along with sterile Saline bottle.   DULoxetine 60 MG capsule Commonly known as:  CYMBALTA Take 60 mg by mouth daily with supper.   ferrous sulfate 325 (65 FE) MG tablet Take 1 tablet (325 mg total) by mouth daily with breakfast.   FiberCon 625 MG tablet Generic drug:  polycarbophil Take 625 mg by mouth daily.   gabapentin 300 MG capsule Commonly known as:  NEURONTIN Take 300 mg by mouth every 6 (six) hours.   LACTOBACILLUS PO Take 1 capsule by mouth daily.   metoprolol tartrate 100 MG tablet Commonly known as:  LOPRESSOR Take 100 mg by mouth daily.   multivitamin with minerals tablet Take 1 tablet by mouth daily.   omeprazole 40 MG capsule Commonly known as:  PRILOSEC Take 40 mg by mouth daily before breakfast.   oxyCODONE 5 MG immediate release tablet Commonly known as:  Oxy IR/ROXICODONE Take 1 tablet (5 mg total) by mouth 3 (three) times daily as needed for moderate pain (unrelieved by Hydrocodone).   prazosin 1 MG capsule Commonly known as:  MINIPRESS Take 1 mg by mouth at bedtime.   sulfamethoxazole-trimethoprim 800-160 MG tablet Commonly known as:  BACTRIM DS,SEPTRA DS Take 1 tablet by mouth 2 (two) times daily  for 5 days.   traMADol 50 MG tablet Commonly known as:  ULTRAM Take 50 mg by mouth every 6 (six) hours.   Vitamin D3 50 MCG (2000 UT) Tabs Take 2,000 Units by mouth daily.       Vitals:   10/20/18 0922 10/20/18 0923  BP:  134/65  Pulse: 93 92  Resp:  18  Temp:  98 F (36.7 C)  SpO2: 100% 98%    Skin clean, dry and intact without evidence of skin break down, no evidence of skin tears noted. IV catheter discontinued intact. Site without signs and symptoms of complications. Dressing and pressure applied. Pt denies pain at this time. No complaints noted.  An After Visit Summary was printed and given to the patient. Patient escorted via [place], and D/C home via private auto.

## 2018-10-20 NOTE — Progress Notes (Signed)
Patient has refuse NIV for the night, machine is at bedside. Instructed patient that if he changes his mind to let staff know to inform Respiratory. Patient verbalized understanding.

## 2018-10-20 NOTE — Care Management Note (Signed)
Case Management Note Manya Silvas, RN MSN CCM Transitions of Care 11[facility] [place] [phone]  Patient Details  Name: KOLSTON LACOUNT MRN: [id number] Date of Birth: 1961/07/20  Subjective/Objective:    Symptomatic anemia               Action/Plan: Spoke with patient at bedside. PTA home with wife. Wife has been taught how to do dressing changes. Patient does not feel having a HHRN would be beneficial. States he will f/u with PCP-Dr. Carie Caddy at [place] in 7-10 days. Wife is coming this afternoon to transport him home. TOC pharmacy to fill prescription. Patient states he can pay fee once his wife gets her. No other transition of care needs.   Expected Discharge Date:  10/20/18               Expected Discharge Plan:  Home/Self Care  In-House Referral:  NA  Discharge planning Services  CM Consult  Post Acute Care Choice:  Home Health Choice offered to:  Patient  DME Arranged:  N/A DME Agency:  NA  HH Arranged:  Refused [place] Agency:  NA  Status of Service:  Completed, signed off  If discussed at [place] of Stay Meetings, dates discussed:    Additional Comments:  Bartholomew Crews, RN 10/20/2018, 2:20 PM

## 2018-10-22 LAB — CULTURE, BLOOD (ROUTINE X 2)
Culture: NO GROWTH
Culture: NO GROWTH
SPECIAL REQUESTS: ADEQUATE
Special Requests: ADEQUATE

## 2018-10-22 NOTE — Discharge Summary (Signed)
[facility] Discharge Summary   Patient: Phillip Blackwell WPY:[id number]   [facility]P: [facility] DOB: 08-Aug-1961   Date of admission: 10/17/2018   Date of discharge: 10/20/2018     Discharge Diagnoses:  Principal Problem:   Symptomatic anemia Active Problems:   HTN (hypertension)   Prostate cancer ([place])   AKI (acute kidney injury) ([place])   OSA (obstructive sleep apnea)   SIRS (systemic inflammatory response syndrome) ([facility])   Rectal bleeding   UTI (urinary tract infection)   Rectal ulceration   Acute blood loss anemia   Admitted From: home Disposition: Home with home health  Recommendations for Outpatient Follow-up:  1. Please follow-up with PCP in 1 week, urology and general surgery as recommended. 2. Please check CBC with PCP in 1 week. 3. Please call GI as needed. 4. Please follow-up with radiation oncology for hyperbaric oxygen.  Follow-up [facility]. Schedule an appointment as soon as possible for a visit in 1 week(s).   Specialty:  General Practice Why:  get a repeat CBC in 1 week.   Contact information: [place] [phone] [phone]        Alexis Frock, MD. Call in 1 day(s).   Specialty:  Urology Why:  call to arrange a close follow up.  Contact information: [place]  [phone]        radiation oncology. Schedule an appointment as soon as possible for a visit in 1 week(s).   Why:  for hyperbaric oxygen.          Diet recommendation: regular diet  Activity: The patient is advised to gradually reintroduce usual activities.  Discharge Condition: good  Code Status: full code  History of present illness: As per the H and P dictated on admission, "Phillip Blackwell is a 58 y.o. male with medical history significant for prostate cancer status post radiation, hypertension, OSA, depression, and history of symptomatic anemia from bleeding rectal ulcer, now presenting to the  emergency department with generalized weakness, subjective fevers, and chills.  Patient reports he been in his usual state of health until 2 days ago when he noted generalized weakness which has been worsening insidiously since that time.  He has had some subjective fevers and chills at times over the past couple days, but denies any cough, shortness of breath, abdominal pain, diarrhea, or vomiting.  He reports intermittent passage of blood clots per rectum for about a year now, occurring every couple months and lasting a few days at a time.  He had 1 of these episodes last week, marked by passage of several large blood clots and scant red blood until 10/15/2018.  He never sees blood or dark stool in his colostomy bag.  Denies any NSAID or alcohol use.  Denies abdominal pain.  Reports that he had an upper endoscopy about a year ago and states that this was normal.  He has history of rectal ulcer felt to be secondary to prostate radiation.  ED Course: Upon arrival to the ED, patient is found to be afebrile, saturating well on room air, tachycardic to 110, and with blood pressure 105/56.  EKG features a sinus rhythm and chest x-ray is negative for acute cardiopulmonary disease.  Chemistry panel is notable for creatinine of 2.47, up from 1.0 a month ago.  CBC features a leukocytosis to 24,000, thrombocytosis, and hemoglobin of 5.1, down from 8.5 a month earlier.  Troponin is undetectable and lactic acid  was elevated to 3.1.  Blood cultures were ordered and the patient was given 30 cc/kg normal saline bolus, and was started on empiric antibiotics.  2 units RBCs were ordered for transfusion.  Tachycardia resolved with fluids, blood pressure remained stable, and the patient will be observed for ongoing evaluation and management."  Hospital Course:  Summary of his active problems in the hospital is as following. Symptomatic anemia -Presented with a hemoglobin of 5.1 -Patient was transfused 4 units PRBC, current  hemoglobin around 9 and remained stable. -Gastroenterology consulted and appreciated, underwent  flexible sigmoidoscopy which showed significant ulcers.  No intervention was performed there was no active bleeding.  Sepsis secondary to possible urinary tract infection from chronic Foley catheter Enterobacter UTI and chronic indwelling Foley catheter -Presented with leukocytosis, subjective fevers, tachycardia, tachypnea, elevated lactic acid -UA concerning for urinary tract infection, however patient does have Foley catheter -Urine culture shows >100k GNR, >100K B Streptococcus -Blood culture showed no growth to date -Initially treated with IV ceftriaxone.  Transition to oral Bactrim.  There is a concern that this is asymptomatic bacteriuria although the patient will be undergoing hyperbaric oxygen therapy and therefore we will provide him antibiotics to complete a total 10-day treatment course.  Penile/scrotal wounds -consulted wound care however was deferred to urology -continue wound care as instructed by urology upon last visit (recently had appt on 10/11/2018) -Discussed with Dr. Louis Meckel, urology, outpt follow up with general surgery and Dr. Tresa Moore to discuss surgery options. Patient also recommended to follow-up with radiation oncology to consider hyperbaric oxygen.  Acute kidney injury -Creatinine on admission was 2.47, significant improvement at the time of the discharge -Likely prerenal azotemia in the setting of acute blood loss and hypovolemia -Renal ultrasound unremarkable  Essential hypertension -Medications held- however BP uncontrolled -will resume amlodipine  Patient was ambulatory without any assistance. On the day of the discharge the patient's vitals were stable , and no other acute medical condition were reported by patient. the patient was felt safe to be discharge at home with home health.  Consultants: gastroenterology, urology phone consultation  Procedures:  Flexible sigmoidoscopy. PRBC transfusion x4.  DISCHARGE MEDICATION: Allergies as of 10/20/2018   No Known Allergies     Medication List    TAKE these medications   amLODipine 10 MG tablet Commonly known as:  NORVASC Take 10 mg by mouth daily.   atorvastatin 40 MG tablet Commonly known as:  LIPITOR Take 40 mg by mouth daily with supper.   chlorproMAZINE 25 MG tablet Commonly known as:  THORAZINE Take 25 mg by mouth at bedtime.   clonazePAM 1 MG tablet Commonly known as:  KLONOPIN Take 1 mg by mouth at bedtime.   Dressing Sponges 4"X4" Pads Provide 1 month supply for daily wet and dry dressing - along with sterile Saline bottle.   DULoxetine 60 MG capsule Commonly known as:  CYMBALTA Take 60 mg by mouth daily with supper.   ferrous sulfate 325 (65 FE) MG tablet Take 1 tablet (325 mg total) by mouth daily with breakfast.   FiberCon 625 MG tablet Generic drug:  polycarbophil Take 625 mg by mouth daily.   gabapentin 300 MG capsule Commonly known as:  NEURONTIN Take 300 mg by mouth every 6 (six) hours.   LACTOBACILLUS PO Take 1 capsule by mouth daily.   metoprolol tartrate 100 MG tablet Commonly known as:  LOPRESSOR Take 100 mg by mouth daily.   multivitamin with minerals tablet Take 1 tablet by mouth daily.  omeprazole 40 MG capsule Commonly known as:  PRILOSEC Take 40 mg by mouth daily before breakfast.   oxyCODONE 5 MG immediate release tablet Commonly known as:  Oxy IR/ROXICODONE Take 1 tablet (5 mg total) by mouth 3 (three) times daily as needed for moderate pain (unrelieved by Hydrocodone).   prazosin 1 MG capsule Commonly known as:  MINIPRESS Take 1 mg by mouth at bedtime.   sulfamethoxazole-trimethoprim 800-160 MG tablet Commonly known as:  BACTRIM DS,SEPTRA DS Take 1 tablet by mouth 2 (two) times daily for 5 days.   traMADol 50 MG tablet Commonly known as:  ULTRAM Take 50 mg by mouth every 6 (six) hours.   Vitamin D3 50 MCG (2000 UT)  Tabs Take 2,000 Units by mouth daily.      No Known Allergies Discharge Instructions    Diet - low sodium heart healthy   Complete by:  As directed    Discharge instructions   Complete by:  As directed    It is important that you read the given instructions as well as go over your medication list with RN to help you understand your care after this hospitalization.  Discharge Instructions: Please follow-up with PCP in 1-2 weeks  Please request your primary care physician to go over all Hospital Tests and Procedure/Radiological results at the follow up. Please get all Hospital records sent to your PCP by signing hospital release before you go home.   Do not take more than prescribed Pain, Sleep and Anxiety Medications. You were cared for by a hospitalist during your hospital stay. If you have any questions about your discharge medications or the care you received while you were in the hospital after you are discharged, you can call the unit @UNIT @ you were admitted to and ask to speak with the hospitalist on call if the hospitalist that took care of you is not available.  Once you are discharged, your primary care physician will handle any further medical issues. Please note that NO REFILLS for any discharge medications will be authorized once you are discharged, as it is imperative that you return to your primary care physician (or establish a relationship with a primary care physician if you do not have one) for your aftercare needs so that they can reassess your need for medications and monitor your lab values. You Must read complete instructions/literature along with all the possible adverse reactions/side effects for all the Medicines you take and that have been prescribed to you. Take any new Medicines after you have completely understood and accept all the possible adverse reactions/side effects. Wear Seat belts while driving. If you have smoked or chewed Tobacco in the last 2 yrs  please stop smoking and/or stop any Recreational drug use.  If you drink alcohol, please moderate the use and do not drive, operating heavy machinery, perform activities at heights, swimming or participation in water activities or provide baby sitting services under influence.   Increase activity slowly   Complete by:  As directed      Discharge Exam: Filed Weights   10/17/18 2100 10/18/18 2125 10/20/18 0230  Weight: 79.4 kg 80.2 kg 81 kg   Vitals:   10/20/18 0922 10/20/18 0923  BP:  134/65  Pulse: 93 92  Resp:  18  Temp:  98 F (36.7 C)  SpO2: 100% 98%   General: Appear in no distress, no Rash; Oral Mucosa moist. Cardiovascular: S1 and S2 Present, no Murmur, no JVD Respiratory: Bilateral Air entry present and Clear  to Auscultation, no Crackles, no wheezes Abdomen: Bowel Sound present, Soft and no tenderness Extremities: no Pedal edema, no calf tenderness Neurology: Grossly no focal neuro deficit.  The results of significant diagnostics from this hospitalization (including imaging, microbiology, ancillary and laboratory) are listed below for reference.    Significant Diagnostic Studies: Dg Chest 2 View  Result Date: 10/17/2018 CLINICAL DATA:  Weakness EXAM: CHEST - 2 VIEW COMPARISON:  08/13/2018 FINDINGS: The heart size and mediastinal contours are within normal limits. Both lungs are clear. Disc degenerative disease of the thoracic spine. IMPRESSION: No acute abnormality of the lungs.  No focal airspace opacity. Electronically Signed   By: Eddie Candle M.D.   On: 10/17/2018 19:00   US Renal  Result Date: 10/18/2018 CLINICAL DATA:  Acute kidney injury and sepsis EXAM: RENAL / URINARY TRACT ULTRASOUND COMPLETE COMPARISON:  08/13/2018 abdominal CT FINDINGS: Right Kidney: Renal measurements: 11 x 5 x 5 cm = volume: 160 mL . Echogenicity within normal limits. No mass or hydronephrosis visualized. Left Kidney: Renal measurements: 10.5 x 5 x 4 cm = volume: 114 mL. Echogenicity within  normal limits. No mass or hydronephrosis visualized. Bladder: Prominent bladder wall thickness although decompressed around a Foley catheter. The same appearance was seen on prior CT. IMPRESSION: 1. Negative sonography of the kidneys. 2. Decompressed thick walled bladder. Electronically Signed   By: Monte Fantasia M.D.   On: 10/18/2018 09:13    Microbiology: Recent Results (from the past 240 hour(s))  Culture, blood (Routine X 2) w Reflex to ID Panel     Status: None (Preliminary result)   Collection Time: 10/17/18  8:28 PM  Result Value Ref Range Status   Specimen Description BLOOD RIGHT ARM  Final   Special Requests   Final    BOTTLES DRAWN AEROBIC AND ANAEROBIC Blood Culture adequate volume   Culture   Final    NO GROWTH 4 DAYS Performed at [facility], [place] [address] [place], [place] [address]    Report Status PENDING  Incomplete  Culture, blood (Routine X 2) w Reflex to ID Panel     Status: None (Preliminary result)   Collection Time: 10/17/18  8:28 PM  Result Value Ref Range Status   Specimen Description BLOOD RIGHT ARM  Final   Special Requests   Final    BOTTLES DRAWN AEROBIC ONLY Blood Culture adequate volume   Culture   Final    NO GROWTH 4 DAYS Performed at [facility], [place] [address], [place], [place] [address]    Report Status PENDING  Incomplete  Urine Culture     Status: Abnormal   Collection Time: 10/17/18  9:54 PM  Result Value Ref Range Status   Specimen Description URINE, CLEAN CATCH  Final   Special Requests NONE  Final   Culture (A)  Final    >=100,000 COLONIES/mL ENTEROBACTER CLOACAE >=100,000 COLONIES/mL GROUP B STREP(S.AGALACTIAE)ISOLATED TESTING AGAINST S. AGALACTIAE NOT ROUTINELY PERFORMED DUE TO PREDICTABILITY OF AMP/PEN/VAN SUSCEPTIBILITY.    Report Status 10/20/2018 FINAL  Final   Organism ID, Bacteria ENTEROBACTER CLOACAE (A)  Final      Susceptibility   Enterobacter cloacae - MIC*    CEFAZOLIN >=64 RESISTANT Resistant      CEFEPIME <=1 SENSITIVE Sensitive     CEFTRIAXONE 16 INTERMEDIATE Intermediate     CIPROFLOXACIN <=0.25 SENSITIVE Sensitive     GENTAMICIN <=1 SENSITIVE Sensitive     IMIPENEM 0.5 SENSITIVE Sensitive     NITROFURANTOIN 32 SENSITIVE Sensitive  TRIMETH/SULFA <=20 SENSITIVE Sensitive     PIP/TAZO Value in next row Sensitive      16 SENSITIVEPerformed at [facility] [address], [place], [place] [address]    * >=100,000 COLONIES/mL ENTEROBACTER CLOACAE     Labs: CBC: Recent Labs  Lab 10/17/18 1920 10/18/18 0842 10/18/18 2217 10/19/18 0516 10/20/18 0356  WBC 24.1* 17.0* 16.8* 18.6* 18.3*  NEUTROABS 20.9*  --   --   --   --   HGB 5.1* 7.1* 8.9* 9.3* 10.9*  HCT 17.6* 22.7* 27.5* 28.9* 33.2*  MCV 91.7 88.7 86.2 86.3 87.8  PLT 463* 351 329 363 962   Basic Metabolic Panel: Recent Labs  Lab 10/17/18 1920 10/18/18 0018 10/19/18 0516 10/20/18 0356  NA 134* 135 136 137  K 4.5 4.2 4.1 3.8  CL 106 112* 109 109  CO2 18* 15* 18* 20*  GLUCOSE 105* 105* 98 84  BUN 45* 38* 17 10  CREATININE 2.47* 1.93* 1.34* 1.11  CALCIUM 8.7* 7.6* 8.4* 9.0   Liver Function Tests: Recent Labs  Lab 10/17/18 1920 10/18/18 0018  AST 65* 44*  ALT 50* 42  ALKPHOS 57 48  BILITOT 0.6 0.6  PROT 6.9 5.4*  ALBUMIN 2.3* 1.9*   No results for input(s): LIPASE, AMYLASE in the last 168 hours. No results for input(s): AMMONIA in the last 168 hours. Cardiac Enzymes: Recent Labs  Lab 10/17/18 1920  TROPONINI <0.03   BNP (last 3 results) Recent Labs    08/18/18 0359  BNP 219.6*   CBG: No results for input(s): GLUCAP in the last 168 hours. Time spent: 35 minutes  Signed:  Berle Mull  [facility] 10/20/2018

## 2018-12-10 ENCOUNTER — Encounter (HOSPITAL_COMMUNITY): Payer: Self-pay | Admitting: Urology

## 2018-12-10 NOTE — OR Nursing (Signed)
Addendum created to reflect correct PACU events

## 2019-01-31 ENCOUNTER — Encounter (HOSPITAL_BASED_OUTPATIENT_CLINIC_OR_DEPARTMENT_OTHER): Payer: No Typology Code available for payment source | Attending: Internal Medicine

## 2019-01-31 DIAGNOSIS — G4730 Sleep apnea, unspecified: Secondary | ICD-10-CM | POA: Insufficient documentation

## 2019-01-31 DIAGNOSIS — Z933 Colostomy status: Secondary | ICD-10-CM | POA: Diagnosis not present

## 2019-01-31 DIAGNOSIS — K604 Rectal fistula: Secondary | ICD-10-CM | POA: Insufficient documentation

## 2019-01-31 DIAGNOSIS — Z923 Personal history of irradiation: Secondary | ICD-10-CM | POA: Insufficient documentation

## 2019-01-31 DIAGNOSIS — Z8673 Personal history of transient ischemic attack (TIA), and cerebral infarction without residual deficits: Secondary | ICD-10-CM | POA: Diagnosis not present

## 2019-01-31 DIAGNOSIS — Z8546 Personal history of malignant neoplasm of prostate: Secondary | ICD-10-CM | POA: Insufficient documentation

## 2019-01-31 DIAGNOSIS — K627 Radiation proctitis: Secondary | ICD-10-CM | POA: Diagnosis present

## 2019-01-31 DIAGNOSIS — I10 Essential (primary) hypertension: Secondary | ICD-10-CM | POA: Diagnosis not present

## 2019-02-03 DIAGNOSIS — K627 Radiation proctitis: Secondary | ICD-10-CM | POA: Diagnosis not present

## 2019-02-04 DIAGNOSIS — K627 Radiation proctitis: Secondary | ICD-10-CM | POA: Diagnosis not present

## 2019-02-07 DIAGNOSIS — K627 Radiation proctitis: Secondary | ICD-10-CM | POA: Diagnosis not present

## 2019-02-08 DIAGNOSIS — K627 Radiation proctitis: Secondary | ICD-10-CM | POA: Diagnosis not present

## 2019-02-09 ENCOUNTER — Encounter (HOSPITAL_BASED_OUTPATIENT_CLINIC_OR_DEPARTMENT_OTHER): Payer: No Typology Code available for payment source | Attending: Internal Medicine

## 2019-02-09 DIAGNOSIS — Y842 Radiological procedure and radiotherapy as the cause of abnormal reaction of the patient, or of later complication, without mention of misadventure at the time of the procedure: Secondary | ICD-10-CM | POA: Diagnosis not present

## 2019-02-09 DIAGNOSIS — K627 Radiation proctitis: Secondary | ICD-10-CM | POA: Diagnosis not present

## 2019-02-09 DIAGNOSIS — G4733 Obstructive sleep apnea (adult) (pediatric): Secondary | ICD-10-CM | POA: Diagnosis not present

## 2019-02-09 DIAGNOSIS — Z8546 Personal history of malignant neoplasm of prostate: Secondary | ICD-10-CM | POA: Diagnosis not present

## 2019-02-09 DIAGNOSIS — K604 Rectal fistula: Secondary | ICD-10-CM | POA: Diagnosis not present

## 2019-02-09 DIAGNOSIS — I10 Essential (primary) hypertension: Secondary | ICD-10-CM | POA: Insufficient documentation

## 2019-02-10 ENCOUNTER — Encounter (HOSPITAL_BASED_OUTPATIENT_CLINIC_OR_DEPARTMENT_OTHER): Payer: Non-veteran care

## 2019-02-10 DIAGNOSIS — K627 Radiation proctitis: Secondary | ICD-10-CM | POA: Diagnosis not present

## 2019-02-15 DIAGNOSIS — K627 Radiation proctitis: Secondary | ICD-10-CM | POA: Diagnosis not present

## 2019-02-16 DIAGNOSIS — K627 Radiation proctitis: Secondary | ICD-10-CM | POA: Diagnosis not present

## 2019-02-17 DIAGNOSIS — K627 Radiation proctitis: Secondary | ICD-10-CM | POA: Diagnosis not present

## 2019-02-18 DIAGNOSIS — K627 Radiation proctitis: Secondary | ICD-10-CM | POA: Diagnosis not present

## 2019-02-21 DIAGNOSIS — K627 Radiation proctitis: Secondary | ICD-10-CM | POA: Diagnosis not present

## 2019-02-22 DIAGNOSIS — K627 Radiation proctitis: Secondary | ICD-10-CM | POA: Diagnosis not present

## 2019-02-24 DIAGNOSIS — K627 Radiation proctitis: Secondary | ICD-10-CM | POA: Diagnosis not present

## 2019-02-25 DIAGNOSIS — K627 Radiation proctitis: Secondary | ICD-10-CM | POA: Diagnosis not present

## 2019-02-28 DIAGNOSIS — K627 Radiation proctitis: Secondary | ICD-10-CM | POA: Diagnosis not present

## 2019-03-01 DIAGNOSIS — K627 Radiation proctitis: Secondary | ICD-10-CM | POA: Diagnosis not present

## 2019-03-02 DIAGNOSIS — K627 Radiation proctitis: Secondary | ICD-10-CM | POA: Diagnosis not present

## 2019-03-03 DIAGNOSIS — K627 Radiation proctitis: Secondary | ICD-10-CM | POA: Diagnosis not present

## 2019-03-04 DIAGNOSIS — K627 Radiation proctitis: Secondary | ICD-10-CM | POA: Diagnosis not present

## 2019-03-07 DIAGNOSIS — K627 Radiation proctitis: Secondary | ICD-10-CM | POA: Diagnosis not present

## 2019-03-08 DIAGNOSIS — K627 Radiation proctitis: Secondary | ICD-10-CM | POA: Diagnosis not present

## 2019-03-09 DIAGNOSIS — K627 Radiation proctitis: Secondary | ICD-10-CM | POA: Diagnosis not present

## 2019-03-10 DIAGNOSIS — K627 Radiation proctitis: Secondary | ICD-10-CM | POA: Diagnosis not present

## 2019-03-11 DIAGNOSIS — K627 Radiation proctitis: Secondary | ICD-10-CM | POA: Diagnosis not present

## 2019-03-13 ENCOUNTER — Other Ambulatory Visit: Payer: Self-pay

## 2019-03-13 ENCOUNTER — Encounter (HOSPITAL_COMMUNITY): Payer: Self-pay

## 2019-03-13 ENCOUNTER — Emergency Department (HOSPITAL_COMMUNITY): Payer: No Typology Code available for payment source

## 2019-03-13 ENCOUNTER — Inpatient Hospital Stay (HOSPITAL_COMMUNITY)
Admission: EM | Admit: 2019-03-13 | Discharge: 2019-03-17 | DRG: 699 | Disposition: A | Discharge status: Home / Self Care | Payer: No Typology Code available for payment source | Attending: Internal Medicine | Admitting: Internal Medicine

## 2019-03-13 DIAGNOSIS — K219 Gastro-esophageal reflux disease without esophagitis: Secondary | ICD-10-CM | POA: Diagnosis present

## 2019-03-13 DIAGNOSIS — X58XXXA Exposure to other specified factors, initial encounter: Secondary | ICD-10-CM | POA: Diagnosis present

## 2019-03-13 DIAGNOSIS — D631 Anemia in chronic kidney disease: Secondary | ICD-10-CM | POA: Diagnosis present

## 2019-03-13 DIAGNOSIS — E871 Hypo-osmolality and hyponatremia: Secondary | ICD-10-CM | POA: Diagnosis present

## 2019-03-13 DIAGNOSIS — N179 Acute kidney failure, unspecified: Secondary | ICD-10-CM | POA: Diagnosis present

## 2019-03-13 DIAGNOSIS — N412 Abscess of prostate: Secondary | ICD-10-CM | POA: Diagnosis present

## 2019-03-13 DIAGNOSIS — E861 Hypovolemia: Secondary | ICD-10-CM | POA: Diagnosis present

## 2019-03-13 DIAGNOSIS — Z820 Family history of epilepsy and other diseases of the nervous system: Secondary | ICD-10-CM | POA: Diagnosis not present

## 2019-03-13 DIAGNOSIS — Z9049 Acquired absence of other specified parts of digestive tract: Secondary | ICD-10-CM | POA: Diagnosis not present

## 2019-03-13 DIAGNOSIS — N400 Enlarged prostate without lower urinary tract symptoms: Secondary | ICD-10-CM | POA: Diagnosis present

## 2019-03-13 DIAGNOSIS — T368X5A Adverse effect of other systemic antibiotics, initial encounter: Secondary | ICD-10-CM | POA: Diagnosis present

## 2019-03-13 DIAGNOSIS — L0291 Cutaneous abscess, unspecified: Secondary | ICD-10-CM | POA: Diagnosis present

## 2019-03-13 DIAGNOSIS — C61 Malignant neoplasm of prostate: Secondary | ICD-10-CM | POA: Diagnosis present

## 2019-03-13 DIAGNOSIS — Z8673 Personal history of transient ischemic attack (TIA), and cerebral infarction without residual deficits: Secondary | ICD-10-CM | POA: Diagnosis not present

## 2019-03-13 DIAGNOSIS — F329 Major depressive disorder, single episode, unspecified: Secondary | ICD-10-CM | POA: Diagnosis present

## 2019-03-13 DIAGNOSIS — F419 Anxiety disorder, unspecified: Secondary | ICD-10-CM | POA: Diagnosis present

## 2019-03-13 DIAGNOSIS — Z6823 Body mass index (BMI) 23.0-23.9, adult: Secondary | ICD-10-CM

## 2019-03-13 DIAGNOSIS — R945 Abnormal results of liver function studies: Secondary | ICD-10-CM | POA: Diagnosis not present

## 2019-03-13 DIAGNOSIS — D539 Nutritional anemia, unspecified: Secondary | ICD-10-CM | POA: Diagnosis present

## 2019-03-13 DIAGNOSIS — D473 Essential (hemorrhagic) thrombocythemia: Secondary | ICD-10-CM | POA: Diagnosis present

## 2019-03-13 DIAGNOSIS — E785 Hyperlipidemia, unspecified: Secondary | ICD-10-CM | POA: Diagnosis present

## 2019-03-13 DIAGNOSIS — E875 Hyperkalemia: Secondary | ICD-10-CM | POA: Diagnosis present

## 2019-03-13 DIAGNOSIS — Z20828 Contact with and (suspected) exposure to other viral communicable diseases: Secondary | ICD-10-CM | POA: Diagnosis present

## 2019-03-13 DIAGNOSIS — Z8546 Personal history of malignant neoplasm of prostate: Secondary | ICD-10-CM | POA: Diagnosis not present

## 2019-03-13 DIAGNOSIS — D649 Anemia, unspecified: Secondary | ICD-10-CM | POA: Diagnosis present

## 2019-03-13 DIAGNOSIS — N360 Urethral fistula: Secondary | ICD-10-CM | POA: Diagnosis present

## 2019-03-13 DIAGNOSIS — N182 Chronic kidney disease, stage 2 (mild): Secondary | ICD-10-CM | POA: Diagnosis present

## 2019-03-13 DIAGNOSIS — G8929 Other chronic pain: Secondary | ICD-10-CM | POA: Diagnosis present

## 2019-03-13 DIAGNOSIS — E440 Moderate protein-calorie malnutrition: Secondary | ICD-10-CM | POA: Diagnosis present

## 2019-03-13 DIAGNOSIS — I129 Hypertensive chronic kidney disease with stage 1 through stage 4 chronic kidney disease, or unspecified chronic kidney disease: Secondary | ICD-10-CM | POA: Diagnosis present

## 2019-03-13 DIAGNOSIS — K651 Peritoneal abscess: Secondary | ICD-10-CM | POA: Diagnosis not present

## 2019-03-13 LAB — CBC WITH DIFFERENTIAL/PLATELET
Abs Immature Granulocytes: 0.14 10*3/uL — ABNORMAL HIGH (ref 0.00–0.07)
Basophils Absolute: 0.1 10*3/uL (ref 0.0–0.1)
Basophils Relative: 0 %
Eosinophils Absolute: 0 10*3/uL (ref 0.0–0.5)
Eosinophils Relative: 0 %
HCT: 27.2 % — ABNORMAL LOW (ref 39.0–52.0)
Hemoglobin: 7.4 g/dL — ABNORMAL LOW (ref 13.0–17.0)
Immature Granulocytes: 1 %
Lymphocytes Relative: 7 %
Lymphs Abs: 1.1 10*3/uL (ref 0.7–4.0)
MCH: 23.2 pg — ABNORMAL LOW (ref 26.0–34.0)
MCHC: 27.2 g/dL — ABNORMAL LOW (ref 30.0–36.0)
MCV: 85.3 fL (ref 80.0–100.0)
Monocytes Absolute: 1.2 10*3/uL — ABNORMAL HIGH (ref 0.1–1.0)
Monocytes Relative: 7 %
Neutro Abs: 14.3 10*3/uL — ABNORMAL HIGH (ref 1.7–7.7)
Neutrophils Relative %: 85 %
Platelets: 541 10*3/uL — ABNORMAL HIGH (ref 150–400)
RBC: 3.19 MIL/uL — ABNORMAL LOW (ref 4.22–5.81)
RDW: 18.4 % — ABNORMAL HIGH (ref 11.5–15.5)
WBC: 16.9 10*3/uL — ABNORMAL HIGH (ref 4.0–10.5)
nRBC: 0 % (ref 0.0–0.2)

## 2019-03-13 LAB — COMPREHENSIVE METABOLIC PANEL
ALT: 64 U/L — ABNORMAL HIGH (ref 0–44)
AST: 50 U/L — ABNORMAL HIGH (ref 15–41)
Albumin: 3.1 g/dL — ABNORMAL LOW (ref 3.5–5.0)
Alkaline Phosphatase: 124 U/L (ref 38–126)
Anion gap: 15 (ref 5–15)
BUN: 51 mg/dL — ABNORMAL HIGH (ref 6–20)
CO2: 15 mmol/L — ABNORMAL LOW (ref 22–32)
Calcium: 9.5 mg/dL (ref 8.9–10.3)
Chloride: 105 mmol/L (ref 98–111)
Creatinine, Ser: 2.7 mg/dL — ABNORMAL HIGH (ref 0.61–1.24)
GFR calc Af Amer: 29 mL/min — ABNORMAL LOW (ref >60)
GFR calc non Af Amer: 25 mL/min — ABNORMAL LOW (ref >60)
Glucose, Bld: 90 mg/dL (ref 70–99)
Potassium: 5.7 mmol/L — ABNORMAL HIGH (ref 3.5–5.1)
Sodium: 135 mmol/L (ref 135–145)
Total Bilirubin: 0.7 mg/dL (ref 0.3–1.2)
Total Protein: 9.3 g/dL — ABNORMAL HIGH (ref 6.5–8.1)

## 2019-03-13 LAB — SEDIMENTATION RATE: Sed Rate: >140 mm/hr — ABNORMAL HIGH (ref 0–16)

## 2019-03-13 LAB — SARS CORONAVIRUS 2 BY RT PCR (HOSPITAL ORDER, PERFORMED IN ~~LOC~~ HOSPITAL LAB): SARS Coronavirus 2: NEGATIVE

## 2019-03-13 LAB — LACTIC ACID, PLASMA: Lactic Acid, Venous: 2.5 mmol/L (ref 0.5–1.9)

## 2019-03-13 MED ORDER — PANTOPRAZOLE SODIUM 40 MG PO TBEC
40.0000 mg | DELAYED_RELEASE_TABLET | Freq: Every day | ORAL | Status: DC
  Administered 2019-03-13 – 2019-03-17 (×5): 40 mg via ORAL
  Filled 2019-03-13 (×5): qty 1

## 2019-03-13 MED ORDER — PRAZOSIN HCL 1 MG PO CAPS
1.0000 mg | ORAL_CAPSULE | Freq: Every day | ORAL | Status: DC
  Administered 2019-03-13 – 2019-03-16 (×4): 1 mg via ORAL
  Filled 2019-03-13 (×6): qty 1

## 2019-03-13 MED ORDER — TRAMADOL HCL 50 MG PO TABS
50.0000 mg | ORAL_TABLET | Freq: Three times a day (TID) | ORAL | Status: DC
  Administered 2019-03-13 – 2019-03-17 (×11): 50 mg via ORAL
  Filled 2019-03-13 (×11): qty 1

## 2019-03-13 MED ORDER — GABAPENTIN 300 MG PO CAPS
300.0000 mg | ORAL_CAPSULE | Freq: Two times a day (BID) | ORAL | Status: DC
  Administered 2019-03-13 – 2019-03-17 (×8): 300 mg via ORAL
  Filled 2019-03-13 (×8): qty 1

## 2019-03-13 MED ORDER — SODIUM CHLORIDE 0.9 % IV SOLN
2.0000 g | Freq: Once | INTRAVENOUS | Status: AC
  Administered 2019-03-13: 2 g via INTRAVENOUS
  Filled 2019-03-13: qty 2

## 2019-03-13 MED ORDER — ATORVASTATIN CALCIUM 40 MG PO TABS
40.0000 mg | ORAL_TABLET | Freq: Every day | ORAL | Status: DC
  Administered 2019-03-13 – 2019-03-16 (×4): 40 mg via ORAL
  Filled 2019-03-13 (×4): qty 1

## 2019-03-13 MED ORDER — DOCUSATE SODIUM 100 MG PO CAPS: 100.0000 mg | ORAL_CAPSULE | Freq: Every day | ORAL | Status: DC | PRN

## 2019-03-13 MED ORDER — SODIUM POLYSTYRENE SULFONATE 15 GM/60ML PO SUSP
15.0000 g | Freq: Once | ORAL | Status: AC
  Administered 2019-03-13: 15 g via ORAL
  Filled 2019-03-13: qty 60

## 2019-03-13 MED ORDER — VANCOMYCIN HCL IN DEXTROSE 1-5 GM/200ML-% IV SOLN
1000.0000 mg | Freq: Once | INTRAVENOUS | Status: AC
  Administered 2019-03-13: 1000 mg via INTRAVENOUS
  Filled 2019-03-13: qty 200

## 2019-03-13 MED ORDER — DULOXETINE HCL 60 MG PO CPEP
60.0000 mg | ORAL_CAPSULE | Freq: Every day | ORAL | Status: DC
  Administered 2019-03-13 – 2019-03-16 (×4): 60 mg via ORAL
  Filled 2019-03-13: qty 2
  Filled 2019-03-13: qty 1
  Filled 2019-03-13 (×2): qty 2

## 2019-03-13 MED ORDER — CLONAZEPAM 1 MG PO TABS
1.0000 mg | ORAL_TABLET | Freq: Every day | ORAL | Status: DC
  Administered 2019-03-13 – 2019-03-16 (×4): 1 mg via ORAL
  Filled 2019-03-13 (×4): qty 1

## 2019-03-13 MED ORDER — CALCIUM GLUCONATE-NACL 1-0.675 GM/50ML-% IV SOLN
1.0000 g | Freq: Once | INTRAVENOUS | Status: AC
  Administered 2019-03-13: 1000 mg via INTRAVENOUS
  Filled 2019-03-13: qty 50

## 2019-03-13 MED ORDER — ACETAMINOPHEN 650 MG RE SUPP: 650.0000 mg | Freq: Four times a day (QID) | RECTAL | Status: DC | PRN

## 2019-03-13 MED ORDER — SODIUM CHLORIDE 0.9 % IV SOLN
INTRAVENOUS | Status: AC
  Administered 2019-03-13 – 2019-03-14 (×2): via INTRAVENOUS

## 2019-03-13 MED ORDER — ADULT MULTIVITAMIN W/MINERALS CH
1.0000 | ORAL_TABLET | Freq: Every day | ORAL | Status: DC
  Administered 2019-03-13 – 2019-03-17 (×5): 1 via ORAL
  Filled 2019-03-13 (×5): qty 1

## 2019-03-13 MED ORDER — SODIUM BICARBONATE 8.4 % IV SOLN
50.0000 meq | Freq: Once | INTRAVENOUS | Status: AC
  Administered 2019-03-13: 50 meq via INTRAVENOUS
  Filled 2019-03-13: qty 50

## 2019-03-13 MED ORDER — CALCIUM POLYCARBOPHIL 625 MG PO TABS
625.0000 mg | ORAL_TABLET | ORAL | Status: DC
  Administered 2019-03-14 – 2019-03-16 (×2): 625 mg via ORAL
  Filled 2019-03-13 (×2): qty 1

## 2019-03-13 MED ORDER — VITAMIN D 25 MCG (1000 UNIT) PO TABS
2000.0000 [IU] | ORAL_TABLET | Freq: Every day | ORAL | Status: DC
  Administered 2019-03-13 – 2019-03-17 (×5): 2000 [IU] via ORAL
  Filled 2019-03-13 (×5): qty 2

## 2019-03-13 MED ORDER — SODIUM CHLORIDE 0.9 % IV SOLN
2.0000 g | Freq: Two times a day (BID) | INTRAVENOUS | Status: DC
  Administered 2019-03-14 – 2019-03-16 (×7): 2 g via INTRAVENOUS
  Filled 2019-03-13 (×9): qty 2

## 2019-03-13 MED ORDER — METOPROLOL TARTRATE 50 MG PO TABS
100.0000 mg | ORAL_TABLET | Freq: Every day | ORAL | Status: DC
  Administered 2019-03-13 – 2019-03-17 (×5): 100 mg via ORAL
  Filled 2019-03-13: qty 2
  Filled 2019-03-13 (×2): qty 4
  Filled 2019-03-13: qty 2
  Filled 2019-03-13: qty 4

## 2019-03-13 MED ORDER — ENOXAPARIN SODIUM 30 MG/0.3ML ~~LOC~~ SOLN
30.0000 mg | SUBCUTANEOUS | Status: DC
  Filled 2019-03-13 (×2): qty 0.3

## 2019-03-13 MED ORDER — ACETAMINOPHEN 325 MG PO TABS
650.0000 mg | ORAL_TABLET | Freq: Four times a day (QID) | ORAL | Status: DC | PRN
  Filled 2019-03-13 (×2): qty 2

## 2019-03-13 MED ORDER — FERROUS SULFATE 325 (65 FE) MG PO TABS
325.0000 mg | ORAL_TABLET | ORAL | Status: DC
  Administered 2019-03-14 – 2019-03-16 (×2): 325 mg via ORAL
  Filled 2019-03-13 (×3): qty 1

## 2019-03-13 MED ORDER — CHLORPROMAZINE HCL 25 MG PO TABS
25.0000 mg | ORAL_TABLET | Freq: Every day | ORAL | Status: DC
  Administered 2019-03-13 – 2019-03-16 (×4): 25 mg via ORAL
  Filled 2019-03-13 (×6): qty 1

## 2019-03-13 MED ORDER — SODIUM CHLORIDE 0.9 % IV BOLUS
1000.0000 mL | Freq: Once | INTRAVENOUS | Status: AC
  Administered 2019-03-13: 1000 mL via INTRAVENOUS

## 2019-03-13 MED ORDER — AMLODIPINE BESYLATE 10 MG PO TABS
10.0000 mg | ORAL_TABLET | Freq: Every day | ORAL | Status: DC
  Administered 2019-03-13 – 2019-03-14 (×2): 10 mg via ORAL
  Filled 2019-03-13 (×2): qty 1

## 2019-03-13 NOTE — ED Notes (Addendum)
Lab called this nurse to notify of a critical lactic level at 2.5.

## 2019-03-13 NOTE — Consult Note (Signed)
Reason for Consult: pelvic abscess Referring Physician: Branson Kranz is an 58 y.o. male.  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 In the last month he had a new skin infection and was put on antibiotics. He has had increasing pain in the area so came into the ER. He also will feel the urge to have a bowel movement and pass urine, though he cannot be specific on amount since he wears a diaper. He has been weight stable over the last 2 months.  Past Medical History:  Diagnosis Date  . Anxiety   . Cancer ([place]) 2016  . Depression   . GERD (gastroesophageal reflux disease)   . Hypertension   . Rectal bleeding 07/07/2018  . Rectal ulceration   . Sleep apnea   . Stroke [facility])    2014    Past Surgical History:  Procedure Laterality Date  . BIOPSY  07/10/2018   Procedure: BIOPSY;  Surgeon: Otis Brace, MD;  Location: [place];  Service: Gastroenterology;;  . Consuela Mimes N/A 08/18/2018   Procedure: CYSTOSCOPY;  Surgeon: Alexis Frock, MD;  Location: [place];  Service: Urology;  Laterality: N/A;  . FLEXIBLE SIGMOIDOSCOPY N/A 07/10/2018   Procedure: FLEXIBLE SIGMOIDOSCOPY;  Surgeon: Otis Brace, MD;  Location: [place];  Service: Gastroenterology;  Laterality: N/A;  Adult EGD Scope. Peds biopsy forceps.  Marland Kitchen FLEXIBLE SIGMOIDOSCOPY N/A 10/19/2018   Procedure: FLEXIBLE SIGMOIDOSCOPY;  Surgeon: Clarene Essex, MD;  Location: [place];  Service: Endoscopy;  Laterality: N/A;  . HERNIA REPAIR    . IRRIGATION AND DEBRIDEMENT ABSCESS N/A 08/18/2018   Procedure: IRRIGATION AND DEBRIDEMENT penal scrotal ABSCESS;  Surgeon: Alexis Frock, MD;  Location: [place];  Service: Urology;  Laterality: N/A;  . IRRIGATION AND DEBRIDEMENT ABSCESS N/A 09/01/2018   Procedure: IRRIGATION AND DEBRIDEMENT ABSCESS;  Surgeon: Alexis Frock, MD;  Location: [place];  Service:  Urology;  Laterality: N/A;  . LAPAROSCOPIC PARTIAL COLECTOMY N/A 08/23/2018   Procedure: LAPAROSCOPIC  END COLOSTOMY CREATION;  Surgeon: Donnie Mesa, MD;  Location: [place];  Service: General;  Laterality: N/A;  . SCROTAL EXPLORATION N/A 08/23/2018   Procedure: SECONDARY PENILE/SCROTUM DEBRIDEMENT;  Surgeon: Alexis Frock, MD;  Location: [place];  Service: Urology;  Laterality: N/A;  . SCROTAL EXPLORATION N/A 08/26/2018   Procedure: THIRD STAGE PENIS AND SCROTAL IRRIGATION AND  DEBRIDEMENT;  Surgeon: Alexis Frock, MD;  Location: [place];  Service: Urology;  Laterality: N/A;    Family History  Problem Relation Age of Onset  . Alzheimer's disease Mother     Social History:  reports that he has never smoked. He has never used smokeless tobacco. He reports that he does not drink alcohol or use drugs.  Allergies: No Known Allergies  Medications: I have reviewed the patient's current medications.  Results for orders placed or performed during the hospital encounter of 03/13/19 (from the past 48 hour(s))  CBC with Differential     Status: Abnormal   Collection Time: 03/13/19  4:43 PM  Result Value Ref Range   WBC 16.9 (H) 4.0 - 10.5 K/uL   RBC 3.19 (L) 4.22 - 5.81 MIL/uL   Hemoglobin 7.4 (L) 13.0 - 17.0 g/dL   HCT 27.2 (L) 39.0 - 52.0 %   MCV 85.3 80.0 - 100.0 fL   MCH 23.2 (L) 26.0 - 34.0 pg   MCHC 27.2 (L) 30.0 - 36.0 g/dL   RDW 18.4 (  H) 11.5 - 15.5 %   Platelets 541 (H) 150 - 400 K/uL   nRBC 0.0 0.0 - 0.2 %   Neutrophils Relative % 85 %   Neutro Abs 14.3 (H) 1.7 - 7.7 K/uL   Lymphocytes Relative 7 %   Lymphs Abs 1.1 0.7 - 4.0 K/uL   Monocytes Relative 7 %   Monocytes Absolute 1.2 (H) 0.1 - 1.0 K/uL   Eosinophils Relative 0 %   Eosinophils Absolute 0.0 0.0 - 0.5 K/uL   Basophils Relative 0 %   Basophils Absolute 0.1 0.0 - 0.1 K/uL   Immature Granulocytes 1 %   Abs Immature Granulocytes 0.14 (H) 0.00 - 0.07 K/uL    Comment: Performed at [facility], [place]  [address], [place], [place] [address]  Comprehensive metabolic panel     Status: Abnormal   Collection Time: 03/13/19  4:43 PM  Result Value Ref Range   Sodium 135 135 - 145 mmol/L   Potassium 5.7 (H) 3.5 - 5.1 mmol/L   Chloride 105 98 - 111 mmol/L   CO2 15 (L) 22 - 32 mmol/L   Glucose, Bld 90 70 - 99 mg/dL   BUN 51 (H) 6 - 20 mg/dL   Creatinine, Ser 2.70 (H) 0.61 - 1.24 mg/dL   Calcium 9.5 8.9 - 10.3 mg/dL   Total Protein 9.3 (H) 6.5 - 8.1 g/dL   Albumin 3.1 (L) 3.5 - 5.0 g/dL   AST 50 (H) 15 - 41 U/L   ALT 64 (H) 0 - 44 U/L   Alkaline Phosphatase 124 38 - 126 U/L   Total Bilirubin 0.7 0.3 - 1.2 mg/dL   GFR calc non Af Amer 25 (L) >60 mL/min   GFR calc Af Amer 29 (L) >60 mL/min   Anion gap 15 5 - 15    Comment: Performed at [facility], [place] [address] [place], [place] [address]  Culture, blood (Routine X 2) w Reflex to ID Panel     Status: None (Preliminary result)   Collection Time: 03/13/19  4:43 PM   Specimen: BLOOD RIGHT FOREARM  Result Value Ref Range   Specimen Description      BLOOD RIGHT FOREARM Performed at [facility], [place] [address] [place], [place] [address]    Special Requests      BOTTLES DRAWN AEROBIC AND ANAEROBIC Blood Culture results may not be optimal due to an inadequate volume of blood received in culture bottles Performed at [facility], [place] [address], [place], [place] [address]    Culture PENDING    Report Status PENDING   Culture, blood (Routine X 2) w Reflex to ID Panel     Status: None (Preliminary result)   Collection Time: 03/13/19  4:43 PM   Specimen: BLOOD RIGHT HAND  Result Value Ref Range   Specimen Description      BLOOD RIGHT HAND Performed at [facility], [place] [address] [place], [place] [address]    Special Requests      BOTTLES DRAWN AEROBIC ONLY Blood Culture results may not be optimal due to an inadequate volume of blood received in culture bottles Performed at [place] [address], [place],  [address]    Culture PENDING    Report Status PENDING   Lactic acid, plasma     Status: Abnormal   Collection Time: 03/13/19  4:43 PM  Result Value Ref Range   Lactic Acid, Venous 2.5 (  HH) 0.5 - 1.9 mmol/L    Comment: CRITICAL RESULT CALLED TO, READ BACK BY AND VERIFIED WITH: H.COOK AT 1724 ON 03/13/19 BY N.THOMPSON Performed at [facility], [place] [address] [place], [place] [address]   SARS Coronavirus 2 [facility] order, Performed in [facility] hospital lab) Nasopharyngeal Nasopharyngeal Swab     Status: None   Collection Time: 03/13/19  4:43 PM   Specimen: Nasopharyngeal Swab  Result Value Ref Range   SARS Coronavirus 2 NEGATIVE NEGATIVE    Comment: (NOTE) If result is NEGATIVE SARS-CoV-2 target nucleic acids are NOT DETECTED. The SARS-CoV-2 RNA is generally detectable in upper and lower  respiratory specimens during the acute phase of infection. The lowest  concentration of SARS-CoV-2 viral copies this assay can detect is 250  copies / mL. A negative result does not preclude SARS-CoV-2 infection  and should not be used as the sole basis for treatment or other  patient management decisions.  A negative result may occur with  improper specimen collection / handling, submission of specimen other  than nasopharyngeal swab, presence of viral mutation(s) within the  areas targeted by this assay, and inadequate number of viral copies  (<250 copies / mL). A negative result must be combined with clinical  observations, patient history, and epidemiological information. If result is POSITIVE SARS-CoV-2 target nucleic acids are DETECTED. The SARS-CoV-2 RNA is generally detectable in upper and lower  respiratory specimens dur ing the acute phase of infection.  Positive  results are indicative of active infection with SARS-CoV-2.  Clinical  correlation with patient history and other diagnostic information is  necessary  to determine patient infection status.  Positive results do  not rule out bacterial infection or co-infection with other viruses. If result is PRESUMPTIVE POSTIVE SARS-CoV-2 nucleic acids MAY BE PRESENT.   A presumptive positive result was obtained on the submitted specimen  and confirmed on repeat testing.  While 2019 novel coronavirus  (SARS-CoV-2) nucleic acids may be present in the submitted sample  additional confirmatory testing may be necessary for epidemiological  and / or clinical management purposes  to differentiate between  SARS-CoV-2 and other Sarbecovirus currently known to infect humans.  If clinically indicated additional testing with an alternate test  methodology [phone]) is advised. The SARS-CoV-2 RNA is generally  detectable in upper and lower respiratory sp ecimens during the acute  phase of infection. The expected result is Negative. Fact Sheet for Patients:  StrictlyIdeas.no Fact Sheet for Healthcare Providers: BankingDealers.co.za This test is not yet approved or cleared by the [place] FDA and has been authorized for detection and/or diagnosis of SARS-CoV-2 by FDA under an Emergency Use Authorization (EUA).  This EUA will remain in effect (meaning this test can be used) for the duration of the COVID-19 declaration under Section 564(b)(1) of the Act, 21 U.S.C. section 360bbb-3(b)(1), unless the authorization is terminated or revoked sooner. Performed at [facility], [place] [address], [place], [place] [address]   Wound or Superficial Culture     Status: None (Preliminary result)   Collection Time: 03/13/19  4:43 PM   Specimen: Scrotum; Wound  Result Value Ref Range   Specimen Description      SCROTUM Performed at [place] [address], [place], [place] [address]    Special Requests      NONE Performed at [facility], [place] [address],  [place], [place] [address]    Gram Stain  FEW WBC PRESENT, PREDOMINANTLY PMN MODERATE GRAM POSITIVE RODS RARE GRAM NEGATIVE RODS RARE GRAM POSITIVE COCCI IN CLUSTERS Performed at [facility], [place] [address] [place], [place] [address]    Culture PENDING    Report Status PENDING   Type and screen     Status: None (Preliminary result)   Collection Time: 03/13/19  6:18 PM  Result Value Ref Range   ABO/RH(D) O POS    Antibody Screen PENDING    Sample Expiration      03/16/2019,2359 Performed at [facility], [place] [address] [place], [place] [address]     Ct Abdomen Pelvis Wo Contrast  Result Date: 03/13/2019 CLINICAL DATA:  Evaluate for abdominal infection. EXAM: CT ABDOMEN AND PELVIS WITHOUT CONTRAST TECHNIQUE: Multidetector CT imaging of the abdomen and pelvis was performed following the standard protocol without IV contrast. COMPARISON:  08/13/2018 FINDINGS: Lower chest: No acute abnormality. Hepatobiliary: No focal liver abnormality is seen. No gallstones, gallbladder wall thickening, or biliary dilatation. Pancreas: Unremarkable. No pancreatic ductal dilatation or surrounding inflammatory changes. Spleen: Normal in size without focal abnormality. Adrenals/Urinary Tract: Normal appearance of the adrenal glands. The kidneys are normal. No mass or hydronephrosis. Diffuse bladder wall thickening is identified, similar to the previous exam. Mild pericystic fat stranding. Stomach/Bowel: Stomach is normal. No bowel wall thickening, distension or inflammation. Left lower quadrant colostomy. Vascular/Lymphatic: Aortic atherosclerosis. No aneurysm. No enlarged lymph nodes within the abdomen. Prominent left pelvic sidewall lymph node measures 1 cm, image 75/2. Previously 0.8 cm. Reproductive: Deep pelvic fluid collection involving the prostate gland and surrounding soft tissues . This measures 4.4 by 5.9 by 3.7 cm, image 86/2 and image 78/4. The fluid collection and inflammatory  changes extend ventrally to involve the pubic symphysis. Although limited due to lack of IV contrast material there appears to be direct communication of this fluid collection with the floor of the urinary bladder, image 64/5. Further, ventral extension to involve the lower rectus musculature is identified and pubic symphysis noted. Gas is now seen within the pubic symphysis with evidence of cortical erosion compatible with osteomyelitis. Intramuscular gas is now seen within the rectus musculature just above the pubic symphysis reflecting myositis, necrotizing myositis not excluded. Inflammatory changes continue ventrally with there is soft tissue gas and thickening just above the base of penis is identified, image 79/5. Other: None Musculoskeletal: Slight widening with cortical erosions along the articular surface of the pubic symphysis is identified consistent with infectious/inflammatory arthropathy, image 85/2. IMPRESSION: 1. Exam detail is diminished secondary to lack of IV contrast material. Prostatic and periprostatic fluid collection is identified within the floor of pelvis. Foci of gas are identified within this fluid collection compatible with abscess abscess. This fluid collection appears to directly communicate within the anterior floor of the urinary bladder. There is also ventral extension of infection to involve the pubic symphysis where there is evidence of inflammatory arthropathy and possible osteomyelitis. Continued ventral extension of inflammation involves the rectus musculature where intramuscular gas is noted, cannot exclude necrotizing myositis. 2. Bladder wall thickening and surrounding fat stranding compatible with cystitis. Electronically Signed   By: Kerby Moors M.D.   On: 03/13/2019 18:01   Dg Chest Port 1 View  Result Date: 03/13/2019 CLINICAL DATA:  Short of breath. EXAM: PORTABLE CHEST 1 VIEW COMPARISON:  10/17/2018 FINDINGS: The heart size and mediastinal contours are within  normal limits. Both lungs are clear. The visualized skeletal structures are unremarkable. IMPRESSION: No active disease. Electronically Signed  By: Kerby Moors M.D.   On: 03/13/2019 17:30    Review of Systems  Constitutional: Positive for fever. Negative for chills.  HENT: Negative for hearing loss.   Eyes: Negative for blurred vision and double vision.  Respiratory: Negative for cough and hemoptysis.   Cardiovascular: Negative for chest pain and palpitations.  Gastrointestinal: Negative for abdominal pain, nausea and vomiting.  Genitourinary: Positive for dysuria.  Musculoskeletal: Negative for myalgias and neck pain.  Skin: Negative for itching and rash.  Neurological: Negative for dizziness, tingling and headaches.  Endo/Heme/Allergies: Does not bruise/bleed easily.  Psychiatric/Behavioral: Negative for depression and suicidal ideas.   Blood pressure (!) 145/70, pulse 65, temperature 99.7 F (37.6 C), temperature source Oral, resp. rate 18, weight 73.5 kg, SpO2 99 %. Physical Exam  Vitals reviewed. Constitutional: He is oriented to person, place, and time. He appears well-developed and well-nourished.  HENT:  Head: Normocephalic and atraumatic.  Eyes: Pupils are equal, round, and reactive to light. Conjunctivae and EOM are normal.  Neck: Normal range of motion. Neck supple.  Cardiovascular: Normal rate and regular rhythm.  Respiratory: Effort normal and breath sounds normal.  GI: Soft. He exhibits no distension. There is no abdominal tenderness.  Colostomy in place  Genitourinary:    Genitourinary Comments: Small wound with drainage and superior and inferior base of penis.   Musculoskeletal: Normal range of motion.  Neurological: He is alert and oriented to person, place, and time.  Skin: Skin is warm and dry.  Psychiatric: He has a normal mood and affect. His behavior is normal.   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

## 2019-03-13 NOTE — ED Notes (Signed)
Called to give report, RN unavailable

## 2019-03-13 NOTE — ED Notes (Signed)
ED TO INPATIENT HANDOFF REPORT  Name/Age/Gender [name] 58 y.o. male  Code Status    Code Status Orders  (From admission, onward)         Start     Ordered   03/13/19 1928  Full code  Continuous     03/13/19 1929        Code Status History    Date Active Date Inactive Code Status Order ID Comments User Context   10/17/2018 2120 10/20/2018 1948 Full Code [id number]  Vianne Bulls, MD ED   09/16/2018 0922 09/18/2018 2014 Full Code [id number]  Oswald Hillock, MD Inpatient   08/14/2018 0221 09/03/2018 1525 Full Code [id number]  Shela Leff, MD ED   07/07/2018 1652 07/11/2018 1912 Full Code [id number]  Caren Griffins, MD Inpatient   Advance Care Planning Activity      Home/SNF/Other Home  Chief Complaint Post-Op Scrotum Pain / Fatigue / Weakness / Unable to Walk / Pelvic Infection   Level of Care/Admitting Diagnosis ED Disposition    ED Disposition Condition [place]: [place] [[id number]]  Level of Care: Stepdown [14]  Admit to SDU based on following criteria: Severe physiological/psychological symptoms:  Any diagnosis requiring assessment & intervention at least every 4 hours on an ongoing basis to obtain desired patient outcomes including stability and rehabilitation  Covid Evaluation: Asymptomatic Screening Protocol (No Symptoms)  Diagnosis: Abscess [921194]  Admitting Physician: Jani Gravel [[id number]]  Attending Physician: Jani Gravel [phone]  Estimated length of stay: past midnight tomorrow  Certification:: I certify this patient will need inpatient services for at least 2 midnights  PT Class (Do Not Modify): Inpatient [101]  PT Acc Code (Do Not Modify): Private [1]       Medical History Past Medical History:  Diagnosis Date  . Anxiety   . Cancer ([place]) 2016  . Depression   . GERD (gastroesophageal reflux disease)   . Hypertension   . Rectal bleeding 07/07/2018  . Rectal ulceration   . Sleep apnea   . Stroke [facility])     2014    Allergies No Known Allergies  IV Location/Drains/Wounds Patient Lines/Drains/Airways Status   Active Line/Drains/Airways    Name:   Placement date:   Placement time:   Site:   Days:   Peripheral IV 03/13/19 Right Forearm   03/13/19    1627    Forearm   less than 1   Colostomy LLQ   08/23/18    1059    LLQ   202   Urethral Catheter MD Double-lumen 16 Fr.   09/10/18    1000    Double-lumen   184   Wound / Incision (Open or Dehisced) 09/16/18 Other (Comment) Penis Circumferential I/D of Penis   09/16/18    0900    Penis   178   Wound / Incision (Open or Dehisced) 09/16/18 Incision - Open Scrotum Lower Posterior Scrotum   09/16/18    0900    Scrotum   178   Wound / Incision (Open or Dehisced) 09/16/18 Incision - Open Perineum Perineum   09/16/18    0900    Perineum   178   Wound / Incision (Open or Dehisced) 10/18/18 Other (Comment) Penis Left Round white boil   10/18/18    0100    Penis   146          Labs/Imaging Results for orders placed or performed during the hospital encounter of 03/13/19 (  from the past 48 hour(s))  CBC with Differential     Status: Abnormal   Collection Time: 03/13/19  4:43 PM  Result Value Ref Range   WBC 16.9 (H) 4.0 - 10.5 K/uL   RBC 3.19 (L) 4.22 - 5.81 MIL/uL   Hemoglobin 7.4 (L) 13.0 - 17.0 g/dL   HCT 27.2 (L) 39.0 - 52.0 %   MCV 85.3 80.0 - 100.0 fL   MCH 23.2 (L) 26.0 - 34.0 pg   MCHC 27.2 (L) 30.0 - 36.0 g/dL   RDW 18.4 (H) 11.5 - 15.5 %   Platelets 541 (H) 150 - 400 K/uL   nRBC 0.0 0.0 - 0.2 %   Neutrophils Relative % 85 %   Neutro Abs 14.3 (H) 1.7 - 7.7 K/uL   Lymphocytes Relative 7 %   Lymphs Abs 1.1 0.7 - 4.0 K/uL   Monocytes Relative 7 %   Monocytes Absolute 1.2 (H) 0.1 - 1.0 K/uL   Eosinophils Relative 0 %   Eosinophils Absolute 0.0 0.0 - 0.5 K/uL   Basophils Relative 0 %   Basophils Absolute 0.1 0.0 - 0.1 K/uL   Immature Granulocytes 1 %   Abs Immature Granulocytes 0.14 (H) 0.00 - 0.07 K/uL    Comment: Performed at [facility], [place] [address] [place], [place] [address]  Comprehensive metabolic panel     Status: Abnormal   Collection Time: 03/13/19  4:43 PM  Result Value Ref Range   Sodium 135 135 - 145 mmol/L   Potassium 5.7 (H) 3.5 - 5.1 mmol/L   Chloride 105 98 - 111 mmol/L   CO2 15 (L) 22 - 32 mmol/L   Glucose, Bld 90 70 - 99 mg/dL   BUN 51 (H) 6 - 20 mg/dL   Creatinine, Ser 2.70 (H) 0.61 - 1.24 mg/dL   Calcium 9.5 8.9 - 10.3 mg/dL   Total Protein 9.3 (H) 6.5 - 8.1 g/dL   Albumin 3.1 (L) 3.5 - 5.0 g/dL   AST 50 (H) 15 - 41 U/L   ALT 64 (H) 0 - 44 U/L   Alkaline Phosphatase 124 38 - 126 U/L   Total Bilirubin 0.7 0.3 - 1.2 mg/dL   GFR calc non Af Amer 25 (L) >60 mL/min   GFR calc Af Amer 29 (L) >60 mL/min   Anion gap 15 5 - 15    Comment: Performed at [facility], [place] [address], [place], [place] [address]  Culture, blood (Routine X 2) w Reflex to ID Panel     Status: None (Preliminary result)   Collection Time: 03/13/19  4:43 PM   Specimen: BLOOD RIGHT FOREARM  Result Value Ref Range   Specimen Description      BLOOD RIGHT FOREARM Performed at [facility], [place] [address] [place], [place] [address]    Special Requests      BOTTLES DRAWN AEROBIC AND ANAEROBIC Blood Culture results may not be optimal due to an inadequate volume of blood received in culture bottles Performed at [facility], [place] [address] [place], [place] [address]    Culture PENDING    Report Status PENDING   Culture, blood (Routine X 2) w Reflex to ID Panel     Status: None (Preliminary result)   Collection Time: 03/13/19  4:43 PM   Specimen: BLOOD RIGHT HAND  Result Value Ref Range   Specimen Description      BLOOD RIGHT HAND Performed at [facility],  [address], [place], [place] [address]    Special Requests      BOTTLES DRAWN AEROBIC ONLY Blood Culture results may not be optimal due to an inadequate volume of blood received in culture  bottles Performed at [place] [address] [place], [place] [address]    Culture PENDING    Report Status PENDING   Lactic acid, plasma     Status: Abnormal   Collection Time: 03/13/19  4:43 PM  Result Value Ref Range   Lactic Acid, Venous 2.5 (HH) 0.5 - 1.9 mmol/L    Comment: CRITICAL RESULT CALLED TO, READ BACK BY AND VERIFIED WITH: H.COOK AT 1724 ON 03/13/19 BY N.THOMPSON Performed at [facility], [place] [address] [place], [place] [address]   SARS Coronavirus 2 [facility] order, Performed in [facility] hospital lab) Nasopharyngeal Nasopharyngeal Swab     Status: None   Collection Time: 03/13/19  4:43 PM   Specimen: Nasopharyngeal Swab  Result Value Ref Range   SARS Coronavirus 2 NEGATIVE NEGATIVE    Comment: (NOTE) If result is NEGATIVE SARS-CoV-2 target nucleic acids are NOT DETECTED. The SARS-CoV-2 RNA is generally detectable in upper and lower  respiratory specimens during the acute phase of infection. The lowest  concentration of SARS-CoV-2 viral copies this assay can detect is 250  copies / mL. A negative result does not preclude SARS-CoV-2 infection  and should not be used as the sole basis for treatment or other  patient management decisions.  A negative result may occur with  improper specimen collection / handling, submission of specimen other  than nasopharyngeal swab, presence of viral mutation(s) within the  areas targeted by this assay, and inadequate number of viral copies  (<250 copies / mL). A negative result must be combined with clinical  observations, patient history, and epidemiological information. If result is POSITIVE SARS-CoV-2 target nucleic acids are DETECTED. The SARS-CoV-2 RNA is generally detectable in upper and lower  respiratory specimens dur ing the acute phase of infection.  Positive  results are indicative of active infection with SARS-CoV-2.  Clinical  correlation with patient history and other  diagnostic information is  necessary to determine patient infection status.  Positive results do  not rule out bacterial infection or co-infection with other viruses. If result is PRESUMPTIVE POSTIVE SARS-CoV-2 nucleic acids MAY BE PRESENT.   A presumptive positive result was obtained on the submitted specimen  and confirmed on repeat testing.  While 2019 novel coronavirus  (SARS-CoV-2) nucleic acids may be present in the submitted sample  additional confirmatory testing may be necessary for epidemiological  and / or clinical management purposes  to differentiate between  SARS-CoV-2 and other Sarbecovirus currently known to infect humans.  If clinically indicated additional testing with an alternate test  methodology [id number]) is advised. The SARS-CoV-2 RNA is generally  detectable in upper and lower respiratory sp ecimens during the acute  phase of infection. The expected result is Negative. Fact Sheet for Patients:  StrictlyIdeas.no Fact Sheet for Healthcare Providers: BankingDealers.co.za This test is not yet approved or cleared by the [place] FDA and has been authorized for detection and/or diagnosis of SARS-CoV-2 by FDA under an Emergency Use Authorization (EUA).  This EUA will remain in effect (meaning this test can be used) for the duration of the COVID-19 declaration under Section 564(b)(1) of the Act, 21 U.S.C. section 360bbb-3(b)(1), unless the authorization is terminated or revoked sooner. Performed at [facility], [place]  [address] [place], [place] [address]   Wound or Superficial Culture     Status: None (Preliminary result)   Collection Time: 03/13/19  4:43 PM   Specimen: Scrotum; Wound  Result Value Ref Range   Specimen Description      SCROTUM Performed at [place] [address] [place], [place] [address]    Special Requests      NONE Performed at [facility], [place] [address] [place], [place] [address]    Gram Stain      FEW WBC PRESENT, PREDOMINANTLY PMN MODERATE GRAM POSITIVE RODS RARE GRAM NEGATIVE RODS RARE GRAM POSITIVE COCCI IN CLUSTERS Performed at [facility], [place] [address] [place], [place] [address]    Culture PENDING    Report Status PENDING   Type and screen     Status: None (Preliminary result)   Collection Time: 03/13/19  6:18 PM  Result Value Ref Range   ABO/RH(D) O POS    Antibody Screen PENDING    Sample Expiration      03/16/2019,2359 Performed at [facility], [place] [address] [place],  [address]    Ct Abdomen Pelvis Wo Contrast  Result Date: 03/13/2019 CLINICAL DATA:  Evaluate for abdominal infection. EXAM: CT ABDOMEN AND PELVIS WITHOUT CONTRAST TECHNIQUE: Multidetector CT imaging of the abdomen and pelvis was performed following the standard protocol without IV contrast. COMPARISON:  08/13/2018 FINDINGS: Lower chest: No acute abnormality. Hepatobiliary: No focal liver abnormality is seen. No gallstones, gallbladder wall thickening, or biliary dilatation. Pancreas: Unremarkable. No pancreatic ductal dilatation or surrounding inflammatory changes. Spleen: Normal in size without focal abnormality. Adrenals/Urinary Tract: Normal appearance of the adrenal glands. The kidneys are normal. No mass or hydronephrosis. Diffuse bladder wall thickening is identified, similar to the previous exam. Mild pericystic fat stranding. Stomach/Bowel: Stomach is normal. No bowel wall thickening, distension or inflammation. Left lower quadrant colostomy. Vascular/Lymphatic: Aortic atherosclerosis. No aneurysm. No enlarged lymph nodes within the abdomen. Prominent left pelvic sidewall lymph node measures 1 cm, image 75/2. Previously 0.8 cm. Reproductive: Deep pelvic fluid collection involving the prostate gland and surrounding soft tissues . This measures 4.4 by 5.9 by 3.7 cm, image 86/2 and image  78/4. The fluid collection and inflammatory changes extend ventrally to involve the pubic symphysis. Although limited due to lack of IV contrast material there appears to be direct communication of this fluid collection with the floor of the urinary bladder, image 64/5. Further, ventral extension to involve the lower rectus musculature is identified and pubic symphysis noted. Gas is now seen within the pubic symphysis with evidence of cortical erosion compatible with osteomyelitis. Intramuscular gas is now seen within the rectus musculature just above the pubic symphysis reflecting myositis, necrotizing myositis not excluded. Inflammatory changes continue ventrally with there is soft tissue gas and thickening just above the base of penis is identified, image 79/5. Other: None Musculoskeletal: Slight widening with cortical erosions along the articular surface of the pubic symphysis is identified consistent with infectious/inflammatory arthropathy, image 85/2. IMPRESSION: 1. Exam detail is diminished secondary to lack of IV contrast material. Prostatic and periprostatic fluid collection is identified within the floor of pelvis. Foci of gas are identified within this fluid collection compatible with abscess abscess. This fluid collection appears to directly communicate within the anterior floor of the urinary bladder. There is also ventral extension of infection to involve the pubic symphysis where there is evidence of inflammatory arthropathy and possible osteomyelitis. Continued ventral extension of  inflammation involves the rectus musculature where intramuscular gas is noted, cannot exclude necrotizing myositis. 2. Bladder wall thickening and surrounding fat stranding compatible with cystitis. Electronically Signed   By: Kerby Moors M.D.   On: 03/13/2019 18:01   Dg Chest Port 1 View  Result Date: 03/13/2019 CLINICAL DATA:  Short of breath. EXAM: PORTABLE CHEST 1 VIEW COMPARISON:  10/17/2018 FINDINGS: The heart  size and mediastinal contours are within normal limits. Both lungs are clear. The visualized skeletal structures are unremarkable. IMPRESSION: No active disease. Electronically Signed   By: Kerby Moors M.D.   On: 03/13/2019 17:30    Pending Labs Unresulted Labs (From admission, onward)    Start     Ordered   03/20/19 0500  Creatinine, serum  (enoxaparin (LOVENOX)    CrCl < 30 ml/min)  Weekly,   R    Comments: while on enoxaparin therapy.    03/13/19 1929   03/14/19 0500  Comprehensive metabolic panel  Tomorrow morning,   R     03/13/19 1929   03/14/19 0500  CBC  Tomorrow morning,   R     03/13/19 1929   03/14/19 0500  TSH  Tomorrow morning,   R     03/13/19 1929   03/14/19 0500  Ferritin  Tomorrow morning,   R     03/13/19 1931   03/14/19 0500  Iron and TIBC  Tomorrow morning,   R     03/13/19 1931   03/14/19 0500  Vitamin B12  Tomorrow morning,   R     03/13/19 1931   03/14/19 0500  Folate RBC  Tomorrow morning,   R     03/13/19 1931   03/13/19 2007  Sedimentation rate  Once,   R     03/13/19 2007   03/13/19 1535  Urinalysis, Routine w reflex microscopic  ONCE - STAT,   STAT     03/13/19 1535          Vitals/Pain Today's Vitals   03/13/19 1930 03/13/19 1937 03/13/19 2000 03/13/19 2030  BP: (!) 143/67 (!) 143/62 (!) 145/70 (!) 149/74  Pulse:  65 65 64  Resp:  18 18 18   Temp:      TempSrc:      SpO2:  99% 99% 99%  Weight:      PainSc:        Isolation Precautions No active isolations  Medications Medications  enoxaparin (LOVENOX) injection 30 mg (has no administration in time range)  0.9 %  sodium chloride infusion ( Intravenous New Bag/Given 03/13/19 1941)  acetaminophen (TYLENOL) tablet 650 mg (has no administration in time range)    Or  acetaminophen (TYLENOL) suppository 650 mg (has no administration in time range)  sodium chloride 0.9 % bolus 1,000 mL (0 mLs Intravenous Stopped 03/13/19 1925)  vancomycin (VANCOCIN) IVPB 1000 mg/200 mL premix (0 mg  Intravenous Stopped 03/13/19 1900)  ceFEPIme (MAXIPIME) 2 g in sodium chloride 0.9 % 100 mL IVPB (0 g Intravenous Stopped 03/13/19 1801)  sodium bicarbonate injection 50 mEq (50 mEq Intravenous Given 03/13/19 1959)  calcium gluconate 1 g/ 50 mL sodium chloride IVPB (0 g Intravenous Stopped 03/13/19 2050)  sodium polystyrene (KAYEXALATE) 15 GM/60ML suspension 15 g (15 g Oral Given 03/13/19 2001)    Mobility walks

## 2019-03-13 NOTE — H&P (Signed)
TRH H&P    Patient Demographics:    Athony Coppa, is a 58 y.o. male  MRN: [id number]  DOB - 1961/02/27  Admit Date - 03/13/2019  Referring MD/NP/PA:  Julianne Rice  Outpatient Primary MD for the patient is [place]  Patient coming from:   home  Chief complaint-  Scrotal hole,    HPI:    Darron Stuck  is a 58 y.o. male, w hypertension, hyperlipidemia, h/o CVA, OSA, prostate cancer s/p  XRT apparently presents with c/o "hole in scrotum" since Friday.  tx for infection by urology w Bactrim.  Pt denies fever, chills, dysuria, notes that he has a new hole, at 6 oclock just below the urethral meatus. He also notes an old opening at 12oclock suprapubic area just above the penis.  Pt presents due to new draining fistula.  See CT scan below.    In Ed,  T 99.7, P 105, R 14, Bp 154/92 pox 100% on RA   CT abd/ pelvis IMPRESSION: 1. Exam detail is diminished secondary to lack of IV contrast material. Prostatic and periprostatic fluid collection is identified within the floor of pelvis. Foci of gas are identified within this fluid collection compatible with abscess abscess. This fluid collection appears to directly communicate within the anterior floor of the urinary bladder. There is also ventral extension of infection to involve the pubic symphysis where there is evidence of inflammatory arthropathy and possible osteomyelitis. Continued ventral extension of inflammation involves the rectus musculature where intramuscular gas is noted, cannot exclude necrotizing myositis. 2. Bladder wall thickening and surrounding fat stranding compatible with cystitis.  Covid -19 negative  Wbc 16.9, Hgb 7.4, Plt 541 Na 135, K 5.7, Bun 51, Creatinine 2.7 Hco3 15 Lactic acid 2.5 Alb 3.1 Ast 50, Alt 64, Alk phos 124, T. Bili 0.7  Blood culture x2 pending Urinalysis pending  Type and screen  pending  Pt given vanco/ cefepime iv in ED  Pt will be admitted for abscess /  ? Osteomyelitis and r/o uti, and anemia, and abnormal liver function    Review of systems:    In addition to the HPI above,  No Fever-chills, No Headache, No changes with Vision or hearing, No problems swallowing food or Liquids, No Chest pain, Cough or Shortness of Breath, No Abdominal pain, No Nausea or Vomiting, bowel movements are regular, No Blood in stool or Urine, No dysuria, No new skin rashes or bruises, No new joints pains-aches,  No new weakness, tingling, numbness in any extremity, No recent weight gain or loss, No polyuria, polydypsia or polyphagia, No significant Mental Stressors.  All other systems reviewed and are negative.    Past History of the following :    Past Medical History:  Diagnosis Date   Anxiety    Cancer ([place]) 2016   Depression    GERD (gastroesophageal reflux disease)    Hypertension    Rectal bleeding 07/07/2018   Rectal ulceration    Sleep apnea    Stroke ([place])    2014  Past Surgical History:  Procedure Laterality Date   BIOPSY  07/10/2018   Procedure: BIOPSY;  Surgeon: Otis Brace, MD;  Location: [facility];  Service: Gastroenterology;;   CYSTOSCOPY N/A 08/18/2018   Procedure: Consuela Mimes;  Surgeon: Alexis Frock, MD;  Location: [facility];  Service: Urology;  Laterality: N/A;   FLEXIBLE SIGMOIDOSCOPY N/A 07/10/2018   Procedure: FLEXIBLE SIGMOIDOSCOPY;  Surgeon: Otis Brace, MD;  Location: [facility];  Service: Gastroenterology;  Laterality: N/A;  Adult EGD Scope. Peds biopsy forceps.   FLEXIBLE SIGMOIDOSCOPY N/A 10/19/2018   Procedure: FLEXIBLE SIGMOIDOSCOPY;  Surgeon: Clarene Essex, MD;  Location: [facility];  Service: Endoscopy;  Laterality: N/A;   HERNIA REPAIR     IRRIGATION AND DEBRIDEMENT ABSCESS N/A 08/18/2018   Procedure: IRRIGATION AND DEBRIDEMENT penal scrotal ABSCESS;  Surgeon: Alexis Frock, MD;  Location: [facility];  Service: Urology;  Laterality: N/A;   IRRIGATION AND DEBRIDEMENT ABSCESS N/A 09/01/2018   Procedure: IRRIGATION AND DEBRIDEMENT ABSCESS;  Surgeon: Alexis Frock, MD;  Location: [facility];  Service: Urology;  Laterality: N/A;   LAPAROSCOPIC PARTIAL COLECTOMY N/A 08/23/2018   Procedure: LAPAROSCOPIC  END COLOSTOMY CREATION;  Surgeon: Donnie Mesa, MD;  Location: [facility];  Service: General;  Laterality: N/A;   SCROTAL EXPLORATION N/A 08/23/2018   Procedure: SECONDARY PENILE/SCROTUM DEBRIDEMENT;  Surgeon: Alexis Frock, MD;  Location: [facility];  Service: Urology;  Laterality: N/A;   SCROTAL EXPLORATION N/A 08/26/2018   Procedure: THIRD STAGE PENIS AND SCROTAL IRRIGATION AND  DEBRIDEMENT;  Surgeon: Alexis Frock, MD;  Location: [facility];  Service: Urology;  Laterality: N/A;      Social History:      Social History   Tobacco Use   Smoking status: Never Smoker   Smokeless tobacco: Never Used  Substance Use Topics   Alcohol use: No       Family History :     Family History  Problem Relation Age of Onset   Alzheimer's disease Mother        Home Medications:   Prior to Admission medications   Medication Sig Start Date End Date Taking? Authorizing Provider  amLODipine (NORVASC) 10 MG tablet Take 10 mg by mouth daily.   Yes [provider]  atorvastatin (LIPITOR) 40 MG tablet Take 40 mg by mouth daily with supper.    Yes [provider]  chlorproMAZINE (THORAZINE) 25 MG tablet Take 25 mg by mouth at bedtime.   Yes [provider]  Cholecalciferol (VITAMIN D3) 2000 units TABS Take 2,000 Units by mouth daily.    Yes [provider]  clonazePAM (KLONOPIN) 1 MG tablet Take 1 mg by mouth at bedtime.    Yes [provider]  Docusate Sodium (STOOL SOFTENER LAXATIVE PO) Take 1 tablet by mouth 2 (two) times a week.   Yes [provider]  DULoxetine (CYMBALTA) 60 MG capsule Take 60 mg by mouth daily with supper.    Yes [provider]  ferrous sulfate 325 (65 FE) MG tablet Take 1 tablet (325 mg total) by mouth daily with breakfast. Patient taking differently: Take 325 mg by mouth 3 (three) times a week.  07/11/18  Yes Thurnell Lose, MD  gabapentin (NEURONTIN) 300 MG capsule Take 300 mg by mouth 2 (two) times a day.    Yes [provider]  LACTOBACILLUS PO Take 1 capsule by mouth daily.    Yes [provider]  metoprolol tartrate (LOPRESSOR) 100 MG tablet Take 100 mg by mouth daily.    Yes  [provider]  Multiple Vitamins-Minerals (MULTIVITAMIN WITH MINERALS) tablet Take 1 tablet by mouth daily.   Yes [provider]  omeprazole (PRILOSEC) 40 MG capsule Take 40 mg by mouth daily as needed (indigestion.).    Yes [provider]  polycarbophil (FIBERCON) 625 MG tablet Take 625 mg by mouth 3 (three) times a week.    Yes [provider]  prazosin (MINIPRESS) 1 MG capsule Take 1 mg by mouth at bedtime.    Yes [provider]  sulfamethoxazole-trimethoprim (BACTRIM DS) 800-160 MG tablet Take 1 tablet by mouth 2 (two) times a day. 03/08/19  Yes [provider]  traMADol (ULTRAM) 50 MG tablet Take 50 mg by mouth 3 (three) times daily.    Yes [provider]  Gauze Pads & Dressings (DRESSING SPONGES) 4"X4" PADS Provide 1 month supply for daily wet and dry dressing - along with sterile Saline bottle. 09/03/18   Thurnell Lose, MD  oxyCODONE (OXY IR/ROXICODONE) 5 MG immediate release tablet Take 1 tablet (5 mg total) by mouth 3 (three) times daily as needed for moderate pain (unrelieved by Hydrocodone). Patient not taking: Reported on 10/17/2018 09/18/18   Kayleen Memos, DO     Allergies:    No Known Allergies   Physical Exam:   Vitals  Blood pressure (!) 147/64, pulse 98, temperature 99.7 F (37.6 C), temperature source Oral, resp. rate 15, weight 73.5 kg, SpO2 98 %.  1.  General: axoxo3  2. Psychiatric: euthymic  3.  Neurologic: cn2-12 intact, reflexes 2+ s ymmetric, diffuse with no clonus, motor 5/5 in all 4 ext  4. HEENMT:  Anicteric, pupils 1.14m symmetric, direct, consensual, near intact Neck: no jvd  5. Respiratory : CTAB  6. Cardiovascular : rrr s1, s2, no m/g/r  7. Gastrointestinal:  Abd: soft, nt, nd, +bs  8. Skin:  Ext: no c/c/e,  No rash  9.Musculoskeletal:  Good ROM,   No adenopathy    Data Review:    CBC Recent Labs  Lab 03/13/19 1643  WBC 16.9*  HGB 7.4*  HCT 27.2*  PLT 541*  MCV 85.3  MCH 23.2*  MCHC 27.2*  RDW 18.4*  LYMPHSABS 1.1  MONOABS 1.2*  EOSABS 0.0  BASOSABS 0.1   ------------------------------------------------------------------------------------------------------------------  Results for orders placed or performed during the hospital encounter of 03/13/19 (from the past 48 hour(s))  CBC with Differential     Status: Abnormal   Collection Time: 03/13/19  4:43 PM  Result Value Ref Range   WBC 16.9 (H) 4.0 - 10.5 K/uL   RBC 3.19 (L) 4.22 - 5.81 MIL/uL   Hemoglobin 7.4 (L) 13.0 - 17.0 g/dL   HCT 27.2 (L) 39.0 - 52.0 %   MCV 85.3 80.0 - 100.0 fL   MCH 23.2 (L) 26.0 - 34.0 pg   MCHC 27.2 (L) 30.0 - 36.0 g/dL   RDW 18.4 (H) 11.5 - 15.5 %   Platelets 541 (H) 150 - 400 K/uL   nRBC 0.0 0.0 - 0.2 %   Neutrophils Relative % 85 %   Neutro Abs 14.3 (H) 1.7 - 7.7 K/uL   Lymphocytes Relative 7 %   Lymphs Abs 1.1 0.7 - 4.0 K/uL   Monocytes Relative 7 %   Monocytes Absolute 1.2 (H) 0.1 - 1.0 K/uL   Eosinophils Relative 0 %   Eosinophils Absolute 0.0 0.0 - 0.5 K/uL   Basophils Relative 0 %   Basophils Absolute 0.1 0.0 - 0.1 K/uL   Immature Granulocytes 1 %  Abs Immature Granulocytes 0.14 (H) 0.00 - 0.07 K/uL    Comment: Performed at [facility], [place] [address], [place], [place] [address]  Comprehensive metabolic panel     Status: Abnormal   Collection Time: 03/13/19  4:43 PM  Result Value Ref Range   Sodium 135 135 - 145 mmol/L    Potassium 5.7 (H) 3.5 - 5.1 mmol/L   Chloride 105 98 - 111 mmol/L   CO2 15 (L) 22 - 32 mmol/L   Glucose, Bld 90 70 - 99 mg/dL   BUN 51 (H) 6 - 20 mg/dL   Creatinine, Ser 2.70 (H) 0.61 - 1.24 mg/dL   Calcium 9.5 8.9 - 10.3 mg/dL   Total Protein 9.3 (H) 6.5 - 8.1 g/dL   Albumin 3.1 (L) 3.5 - 5.0 g/dL   AST 50 (H) 15 - 41 U/L   ALT 64 (H) 0 - 44 U/L   Alkaline Phosphatase 124 38 - 126 U/L   Total Bilirubin 0.7 0.3 - 1.2 mg/dL   GFR calc non Af Amer 25 (L) >60 mL/min   GFR calc Af Amer 29 (L) >60 mL/min   Anion gap 15 5 - 15    Comment: Performed at [facility], [place] [address], [place], [place] [address]  Culture, blood (Routine X 2) w Reflex to ID Panel     Status: None (Preliminary result)   Collection Time: 03/13/19  4:43 PM   Specimen: BLOOD RIGHT FOREARM  Result Value Ref Range   Specimen Description      BLOOD RIGHT FOREARM Performed at [facility], [place] [address] [place], [place] [address]    Special Requests      BOTTLES DRAWN AEROBIC AND ANAEROBIC Blood Culture results may not be optimal due to an inadequate volume of blood received in culture bottles Performed at [facility], [place] [address], [place], [place] [address]    Culture PENDING    Report Status PENDING   Culture, blood (Routine X 2) w Reflex to ID Panel     Status: None (Preliminary result)   Collection Time: 03/13/19  4:43 PM   Specimen: BLOOD RIGHT HAND  Result Value Ref Range   Specimen Description      BLOOD RIGHT HAND Performed at [facility], [place] [address], [place], [place] [address]    Special Requests      BOTTLES DRAWN AEROBIC ONLY Blood Culture results may not be optimal due to an inadequate volume of blood received in culture bottles Performed at [facility] [address] [place],  [address]    Culture PENDING    Report Status PENDING   Lactic acid, plasma     Status: Abnormal   Collection Time: 03/13/19   4:43 PM  Result Value Ref Range   Lactic Acid, Venous 2.5 (HH) 0.5 - 1.9 mmol/L    Comment: CRITICAL RESULT CALLED TO, READ BACK BY AND VERIFIED WITH: H.COOK AT 1724 ON 03/13/19 BY N.THOMPSON Performed at [facility], [place] [address], [place],  [address]   SARS Coronavirus 2 [facility] order, Performed in [facility] hospital lab) Nasopharyngeal Nasopharyngeal Swab     Status: None   Collection Time: 03/13/19  4:43 PM   Specimen: Nasopharyngeal Swab  Result Value Ref Range   SARS Coronavirus 2 NEGATIVE NEGATIVE    Comment: (NOTE) If result is NEGATIVE SARS-CoV-2 target nucleic acids are NOT DETECTED. The SARS-CoV-2 RNA is generally detectable  in upper and lower  respiratory specimens during the acute phase of infection. The lowest  concentration of SARS-CoV-2 viral copies this assay can detect is 250  copies / mL. A negative result does not preclude SARS-CoV-2 infection  and should not be used as the sole basis for treatment or other  patient management decisions.  A negative result may occur with  improper specimen collection / handling, submission of specimen other  than nasopharyngeal swab, presence of viral mutation(s) within the  areas targeted by this assay, and inadequate number of viral copies  (<250 copies / mL). A negative result must be combined with clinical  observations, patient history, and epidemiological information. If result is POSITIVE SARS-CoV-2 target nucleic acids are DETECTED. The SARS-CoV-2 RNA is generally detectable in upper and lower  respiratory specimens dur ing the acute phase of infection.  Positive  results are indicative of active infection with SARS-CoV-2.  Clinical  correlation with patient history and other diagnostic information is  necessary to determine patient infection status.  Positive results do  not rule out bacterial infection or co-infection with other viruses. If result is PRESUMPTIVE POSTIVE SARS-CoV-2 nucleic  acids MAY BE PRESENT.   A presumptive positive result was obtained on the submitted specimen  and confirmed on repeat testing.  While 2019 novel coronavirus  (SARS-CoV-2) nucleic acids may be present in the submitted sample  additional confirmatory testing may be necessary for epidemiological  and / or clinical management purposes  to differentiate between  SARS-CoV-2 and other Sarbecovirus currently known to infect humans.  If clinically indicated additional testing with an alternate test  methodology [phone]) is advised. The SARS-CoV-2 RNA is generally  detectable in upper and lower respiratory sp ecimens during the acute  phase of infection. The expected result is Negative. Fact Sheet for Patients:  StrictlyIdeas.no Fact Sheet for Healthcare Providers: BankingDealers.co.za This test is not yet approved or cleared by the [place] FDA and has been authorized for detection and/or diagnosis of SARS-CoV-2 by FDA under an Emergency Use Authorization (EUA).  This EUA will remain in effect (meaning this test can be used) for the duration of the COVID-19 declaration under Section 564(b)(1) of the Act, 21 U.S.C. section 360bbb-3(b)(1), unless the authorization is terminated or revoked sooner. Performed at [facility], [place] [address] [place], [place] [address]   Wound or Superficial Culture     Status: None (Preliminary result)   Collection Time: 03/13/19  4:43 PM   Specimen: Scrotum; Wound  Result Value Ref Range   Specimen Description      SCROTUM Performed at [place] [address], [place], [place] [address]    Special Requests      NONE Performed at [facility], [place] [address] [place], [place] [address]    Gram Stain      FEW WBC PRESENT, PREDOMINANTLY PMN MODERATE GRAM POSITIVE RODS RARE GRAM NEGATIVE RODS RARE GRAM POSITIVE COCCI IN CLUSTERS Performed at  [facility], [place] [address], [place], [place] [address]    Culture PENDING    Report Status PENDING     Chemistries  Recent Labs  Lab 03/13/19 1643  NA 135  K 5.7*  CL 105  CO2 15*  GLUCOSE 90  BUN 51*  CREATININE 2.70*  CALCIUM 9.5  AST 50*  ALT 64*  ALKPHOS 124  BILITOT 0.7   ------------------------------------------------------------------------------------------------------------------  ------------------------------------------------------------------------------------------------------------------ GFR: Estimated Creatinine Clearance: 30.6 mL/min (A) (by C-G formula based on  SCr of 2.7 mg/dL (H)). Liver Function Tests: Recent Labs  Lab 03/13/19 1643  AST 50*  ALT 64*  ALKPHOS 124  BILITOT 0.7  PROT 9.3*  ALBUMIN 3.1*   No results for input(s): LIPASE, AMYLASE in the last 168 hours. No results for input(s): AMMONIA in the last 168 hours. Coagulation Profile: No results for input(s): INR, PROTIME in the last 168 hours. Cardiac Enzymes: No results for input(s): CKTOTAL, CKMB, CKMBINDEX, TROPONINI in the last 168 hours. BNP (last 3 results) No results for input(s): PROBNP in the last 8760 hours. HbA1C: No results for input(s): HGBA1C in the last 72 hours. CBG: No results for input(s): GLUCAP in the last 168 hours. Lipid Profile: No results for input(s): CHOL, HDL, LDLCALC, TRIG, CHOLHDL, LDLDIRECT in the last 72 hours. Thyroid Function Tests: No results for input(s): TSH, T4TOTAL, FREET4, T3FREE, THYROIDAB in the last 72 hours. Anemia Panel: No results for input(s): VITAMINB12, FOLATE, FERRITIN, TIBC, IRON, RETICCTPCT in the last 72 hours.  --------------------------------------------------------------------------------------------------------------- Urine analysis:    Component Value Date/Time   COLORURINE YELLOW 10/17/2018 2154   APPEARANCEUR CLOUDY (A) 10/17/2018 2154   LABSPEC 1.014 10/17/2018 2154   PHURINE 5.0 10/17/2018 2154   GLUCOSEU  50 (A) 10/17/2018 2154   HGBUR LARGE (A) 10/17/2018 2154   BILIRUBINUR NEGATIVE 10/17/2018 2154   BILIRUBINUR negative 09/09/2018 1136   KETONESUR NEGATIVE 10/17/2018 2154   PROTEINUR 30 (A) 10/17/2018 2154   UROBILINOGEN 0.2 09/09/2018 1136   NITRITE NEGATIVE 10/17/2018 2154   LEUKOCYTESUR LARGE (A) 10/17/2018 2154      Imaging Results:    Ct Abdomen Pelvis Wo Contrast  Result Date: 03/13/2019 CLINICAL DATA:  Evaluate for abdominal infection. EXAM: CT ABDOMEN AND PELVIS WITHOUT CONTRAST TECHNIQUE: Multidetector CT imaging of the abdomen and pelvis was performed following the standard protocol without IV contrast. COMPARISON:  08/13/2018 FINDINGS: Lower chest: No acute abnormality. Hepatobiliary: No focal liver abnormality is seen. No gallstones, gallbladder wall thickening, or biliary dilatation. Pancreas: Unremarkable. No pancreatic ductal dilatation or surrounding inflammatory changes. Spleen: Normal in size without focal abnormality. Adrenals/Urinary Tract: Normal appearance of the adrenal glands. The kidneys are normal. No mass or hydronephrosis. Diffuse bladder wall thickening is identified, similar to the previous exam. Mild pericystic fat stranding. Stomach/Bowel: Stomach is normal. No bowel wall thickening, distension or inflammation. Left lower quadrant colostomy. Vascular/Lymphatic: Aortic atherosclerosis. No aneurysm. No enlarged lymph nodes within the abdomen. Prominent left pelvic sidewall lymph node measures 1 cm, image 75/2. Previously 0.8 cm. Reproductive: Deep pelvic fluid collection involving the prostate gland and surrounding soft tissues . This measures 4.4 by 5.9 by 3.7 cm, image 86/2 and image 78/4. The fluid collection and inflammatory changes extend ventrally to involve the pubic symphysis. Although limited due to lack of IV contrast material there appears to be direct communication of this fluid collection with the floor of the urinary bladder, image 64/5. Further, ventral  extension to involve the lower rectus musculature is identified and pubic symphysis noted. Gas is now seen within the pubic symphysis with evidence of cortical erosion compatible with osteomyelitis. Intramuscular gas is now seen within the rectus musculature just above the pubic symphysis reflecting myositis, necrotizing myositis not excluded. Inflammatory changes continue ventrally with there is soft tissue gas and thickening just above the base of penis is identified, image 79/5. Other: None Musculoskeletal: Slight widening with cortical erosions along the articular surface of the pubic symphysis is identified consistent with infectious/inflammatory arthropathy, image 85/2. IMPRESSION: 1. Exam detail is diminished  secondary to lack of IV contrast material. Prostatic and periprostatic fluid collection is identified within the floor of pelvis. Foci of gas are identified within this fluid collection compatible with abscess abscess. This fluid collection appears to directly communicate within the anterior floor of the urinary bladder. There is also ventral extension of infection to involve the pubic symphysis where there is evidence of inflammatory arthropathy and possible osteomyelitis. Continued ventral extension of inflammation involves the rectus musculature where intramuscular gas is noted, cannot exclude necrotizing myositis. 2. Bladder wall thickening and surrounding fat stranding compatible with cystitis. Electronically Signed   By: Kerby Moors M.D.   On: 03/13/2019 18:01   Dg Chest Port 1 View  Result Date: 03/13/2019 CLINICAL DATA:  Short of breath. EXAM: PORTABLE CHEST 1 VIEW COMPARISON:  10/17/2018 FINDINGS: The heart size and mediastinal contours are within normal limits. Both lungs are clear. The visualized skeletal structures are unremarkable. IMPRESSION: No active disease. Electronically Signed   By: Kerby Moors M.D.   On: 03/13/2019 17:30   12 lead ekg ordered,     Assessment & Plan:     Principal Problem:   Abscess Active Problems:   Anemia   Hyperkalemia   ARF (acute renal failure) (HCC)   Abnormal liver function   Periprostatic abscess ? osteomyelitis Blood culture x2 Check ESR Urinalysis vanco iv/ cefepime iv pharmacy to dose Urology consulted by Ed, appreciate input  ARF secondary to Bactrim STOP Bactrim Check urinalysis Check cpk Hydrate with ns iv at 177m per hour x1 day Check cmp in am  Hyperkalemia secondary to Bactrim  12 lead ekg stat Sodium bicarb 1amp iv x1 calclium gluconate 1gm iv x1 Kayexalate 15gm po x1 Check bmp at 2300  Abnormal liver function Check acute hepatitis panel Check cpk Consider stopping Atorvastatin Check cmp in am  Anemia Check ferritin, iron, tibc, b12, folate, tsh,  Type and screen Cont ferrous sulfate '325mg'$  po qday Check cbc in am  Hypertension Cont metoprolol '100mg'$  po bid Cont Amlodipine '10mg'$  po qday  Anxiety Cont Duloxetine '60mg'$  po qday Cont clonazepam '1mg'$  po qhs  Bph Cont prazosin '1mg'$  po qhs  Gerd Cont PPI  Chronic pain Cont Gabapentin   DVT Prophylaxis-   Lovenox - SCDs   AM Labs Ordered, also please review Full Orders  Family Communication: Admission, patients condition and plan of care including tests being ordered have been discussed with the patient and wife who indicate understanding and agree with the plan and Code Status.  Code Status:  FULL CODE,  Pt's wife present at bedside  Admission status: Inpatient: Based on patients clinical presentation and evaluation of above clinical data, I have made determination that patient meets Inpatient criteria at this time.   Time spent in minutes : 70 minutes   JJani GravelM.D on 03/13/2019 at 7:33 PM

## 2019-03-13 NOTE — ED Provider Notes (Signed)
Patient placed in Quick Look pathway, seen and evaluated   Chief Complaint: hole in y scrotum  HPI:   Patient states that a hole just "showed up" on his scrotum. He has hx of prostate ca and radiation. The [facility] and is currently taking abx. He says that he has 8/10 pain  ROS: scrotal ulcer (one)  Physical Exam:   Gen: No distress  Neuro: Awake and Alert  Skin: Warm    Focused Exam: appears uncomfortable, sitting "cock-eyed."   Initiation of care has begun. The patient has been counseled on the process, plan, and necessity for staying for the completion/evaluation, and the remainder of the medical screening examination    Margarita Mail, PA-C 03/13/19 1504    Dorie Rank, MD 03/14/19 1102

## 2019-03-13 NOTE — Consult Note (Signed)
Consultation: Urethrocutaneous fistula, rectourethral fistula Requested by: Dr. Julianne Rice  History of Present Illness: Phillip Blackwell is well-known to our service.  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  He underwent an colostomy at that time and multiple debridements of the perineum and scrotum.  He was seen by Dr. Tammi Klippel in the office a few days ago with more drainage from 1 of the fistula tracts and was started on Bactrim.  He presented to the emergency department today with generalized fatigue and mild dyspnea on exertion.  He was just feeling a bit run down and his lactic acid was noted to be elevated and creatinine up to 2.7.  The patient also noted a new draining fistula through the anterior scrotum.  He has not been voiding well.  He typically feels the urge to have a bowel movement and then will have a bowel movement where most of the urine will come out per rectum.  He underwent CT scan of the abdomen and pelvis which revealed a pelvic fluid collection with some air tracking along the prostate anterior bladder and around the pubic bone.  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  There did not appear to be any drainable fluid collection as a lot of the pelvic fluid is likely fistula to the rectum where he is getting rid of most of the urine per rectum.  Hospitalist has admitted him for fluid resuscitation and antibiotics.  Past Medical History:  Diagnosis Date  . Anxiety   . Cancer (Early) 2016  . Depression   . GERD (gastroesophageal reflux disease)   . Hypertension   . Rectal bleeding 07/07/2018  . Rectal ulceration   . Sleep apnea   . Stroke [facility])    2014   Past Surgical History:  Procedure Laterality Date  . BIOPSY  07/10/2018   Procedure: BIOPSY;  Surgeon: Otis Brace, MD;  Location: [place];  Service: Gastroenterology;;  . Consuela Mimes N/A 08/18/2018    Procedure: CYSTOSCOPY;  Surgeon: Alexis Frock, MD;  Location: [place];  Service: Urology;  Laterality: N/A;  . FLEXIBLE SIGMOIDOSCOPY N/A 07/10/2018   Procedure: FLEXIBLE SIGMOIDOSCOPY;  Surgeon: Otis Brace, MD;  Location: [place];  Service: Gastroenterology;  Laterality: N/A;  Adult EGD Scope. Peds biopsy forceps.  Marland Kitchen FLEXIBLE SIGMOIDOSCOPY N/A 10/19/2018   Procedure: FLEXIBLE SIGMOIDOSCOPY;  Surgeon: Clarene Essex, MD;  Location: [place];  Service: Endoscopy;  Laterality: N/A;  . HERNIA REPAIR    . IRRIGATION AND DEBRIDEMENT ABSCESS N/A 08/18/2018   Procedure: IRRIGATION AND DEBRIDEMENT penal scrotal ABSCESS;  Surgeon: Alexis Frock, MD;  Location: [place];  Service: Urology;  Laterality: N/A;  . IRRIGATION AND DEBRIDEMENT ABSCESS N/A 09/01/2018   Procedure: IRRIGATION AND DEBRIDEMENT ABSCESS;  Surgeon: Alexis Frock, MD;  Location: [place];  Service: Urology;  Laterality: N/A;  . LAPAROSCOPIC PARTIAL COLECTOMY N/A 08/23/2018   Procedure: LAPAROSCOPIC  END COLOSTOMY CREATION;  Surgeon: Donnie Mesa, MD;  Location: [place];  Service: General;  Laterality: N/A;  . SCROTAL EXPLORATION N/A 08/23/2018   Procedure: SECONDARY PENILE/SCROTUM DEBRIDEMENT;  Surgeon: Alexis Frock, MD;  Location: [place];  Service: Urology;  Laterality: N/A;  . SCROTAL EXPLORATION N/A 08/26/2018   Procedure: THIRD STAGE PENIS AND SCROTAL IRRIGATION AND  DEBRIDEMENT;  Surgeon: Alexis Frock, MD;  Location: [place];  Service: Urology;  Laterality: N/A;    Home Medications:  (Not in  a hospital admission)  Allergies: No Known Allergies  Family History  Problem Relation Age of Onset  . Alzheimer's disease Mother    Social History:  reports that he has never smoked. He has never used smokeless tobacco. He reports that he does not drink alcohol or use drugs.  ROS: A complete review of systems was performed.  All systems are negative except for pertinent findings as noted. Review of Systems  Constitutional:  Positive for malaise/fatigue.     Physical Exam:  Vital signs in last 24 hours: Temp:  [99.7 F (37.6 C)] 99.7 F (37.6 C) (08/02 1456) Pulse Rate:  [64-105] 64 (08/02 2030) Resp:  [14-18] 18 (08/02 2030) BP: (142-154)/(62-92) 149/74 (08/02 2030) SpO2:  [98 %-100 %] 99 % (08/02 2030) Weight:  [73.5 kg] 73.5 kg (08/02 1458) General:  Alert and oriented, No acute distress HEENT: Normocephalic, atraumatic Neck: No JVD or lymphadenopathy Cardiovascular: Regular rate and rhythm Lungs: Regular rate and effort Abdomen: Soft, nontender, nondistended, no abdominal masses Back: No CVA tenderness Extremities: No edema Neurologic: Grossly intact GU: There is a chronic fistula the inferior border of the pubic bone in the superior base of the penis that appears mature and is draining fluid and mucus.  There is a new fistula on the anterior scrotum.  There is a fistula on the anterior perineum.  There is no fluid collection, purulence or induration.  Nor crepitus of any of the suprapubic tissues, penis, scrotum.  All these areas appear to be fistula tracts draining urine.  The remainder of the perineum appears normal.  Laboratory Data:  Results for orders placed or performed during the hospital encounter of 03/13/19 (from the past 24 hour(s))  CBC with Differential     Status: Abnormal   Collection Time: 03/13/19  4:43 PM  Result Value Ref Range   WBC 16.9 (H) 4.0 - 10.5 K/uL   RBC 3.19 (L) 4.22 - 5.81 MIL/uL   Hemoglobin 7.4 (L) 13.0 - 17.0 g/dL   HCT 27.2 (L) 39.0 - 52.0 %   MCV 85.3 80.0 - 100.0 fL   MCH 23.2 (L) 26.0 - 34.0 pg   MCHC 27.2 (L) 30.0 - 36.0 g/dL   RDW 18.4 (H) 11.5 - 15.5 %   Platelets 541 (H) 150 - 400 K/uL   nRBC 0.0 0.0 - 0.2 %   Neutrophils Relative % 85 %   Neutro Abs 14.3 (H) 1.7 - 7.7 K/uL   Lymphocytes Relative 7 %   Lymphs Abs 1.1 0.7 - 4.0 K/uL   Monocytes Relative 7 %   Monocytes Absolute 1.2 (H) 0.1 - 1.0 K/uL   Eosinophils Relative 0 %   Eosinophils  Absolute 0.0 0.0 - 0.5 K/uL   Basophils Relative 0 %   Basophils Absolute 0.1 0.0 - 0.1 K/uL   Immature Granulocytes 1 %   Abs Immature Granulocytes 0.14 (H) 0.00 - 0.07 K/uL  Comprehensive metabolic panel     Status: Abnormal   Collection Time: 03/13/19  4:43 PM  Result Value Ref Range   Sodium 135 135 - 145 mmol/L   Potassium 5.7 (H) 3.5 - 5.1 mmol/L   Chloride 105 98 - 111 mmol/L   CO2 15 (L) 22 - 32 mmol/L   Glucose, Bld 90 70 - 99 mg/dL   BUN 51 (H) 6 - 20 mg/dL   Creatinine, Ser 2.70 (H) 0.61 - 1.24 mg/dL   Calcium 9.5 8.9 - 10.3 mg/dL   Total Protein 9.3 (H) 6.5 - 8.1  g/dL   Albumin 3.1 (L) 3.5 - 5.0 g/dL   AST 50 (H) 15 - 41 U/L   ALT 64 (H) 0 - 44 U/L   Alkaline Phosphatase 124 38 - 126 U/L   Total Bilirubin 0.7 0.3 - 1.2 mg/dL   GFR calc non Af Amer 25 (L) >60 mL/min   GFR calc Af Amer 29 (L) >60 mL/min   Anion gap 15 5 - 15  Culture, blood (Routine X 2) w Reflex to ID Panel     Status: None (Preliminary result)   Collection Time: 03/13/19  4:43 PM   Specimen: BLOOD RIGHT FOREARM  Result Value Ref Range   Specimen Description      BLOOD RIGHT FOREARM Performed at [facility], [place] [address] [place], [place] [address]    Special Requests      BOTTLES DRAWN AEROBIC AND ANAEROBIC Blood Culture results may not be optimal due to an inadequate volume of blood received in culture bottles Performed at [facility], [place] [address] [place], [place] [address]    Culture PENDING    Report Status PENDING   Culture, blood (Routine X 2) w Reflex to ID Panel     Status: None (Preliminary result)   Collection Time: 03/13/19  4:43 PM   Specimen: BLOOD RIGHT HAND  Result Value Ref Range   Specimen Description      BLOOD RIGHT HAND Performed at [facility], [place] [address], [place], [place] [address]    Special Requests      BOTTLES DRAWN AEROBIC ONLY Blood Culture results may not be optimal due to an inadequate volume of blood received in  culture bottles Performed at [place] [address] [place], [place] [address]    Culture PENDING    Report Status PENDING   Lactic acid, plasma     Status: Abnormal   Collection Time: 03/13/19  4:43 PM  Result Value Ref Range   Lactic Acid, Venous 2.5 (HH) 0.5 - 1.9 mmol/L  SARS Coronavirus 2 [facility] order, Performed in [facility] hospital lab) Nasopharyngeal Nasopharyngeal Swab     Status: None   Collection Time: 03/13/19  4:43 PM   Specimen: Nasopharyngeal Swab  Result Value Ref Range   SARS Coronavirus 2 NEGATIVE NEGATIVE  Wound or Superficial Culture     Status: None (Preliminary result)   Collection Time: 03/13/19  4:43 PM   Specimen: Scrotum; Wound  Result Value Ref Range   Specimen Description      SCROTUM Performed at [place] [address] [place], [place] [address]    Special Requests      NONE Performed at [facility], [place] [address], [place], [place] [address]    Gram Stain      FEW WBC PRESENT, PREDOMINANTLY PMN MODERATE GRAM POSITIVE RODS RARE GRAM NEGATIVE RODS RARE GRAM POSITIVE COCCI IN CLUSTERS Performed at [facility], [place] [address], [place], [place] [address]    Culture PENDING    Report Status PENDING   Type and screen     Status: None (Preliminary result)   Collection Time: 03/13/19  6:18 PM  Result Value Ref Range   ABO/RH(D) O POS    Antibody Screen PENDING    Sample Expiration      03/16/2019,2359 Performed at [facility], [place] [address], [place], [place] [address]    Recent Results (from the past 240 hour(s))  Culture, blood (Routine X 2) w Reflex to ID Panel     Status: None (Preliminary result)   Collection Time: 03/13/19  4:43 PM   Specimen: BLOOD RIGHT FOREARM  Result Value Ref Range Status   Specimen Description   Final    BLOOD RIGHT FOREARM Performed at [facility], [place] [address], [place], [place] [address]    Special  Requests   Final    BOTTLES DRAWN AEROBIC AND ANAEROBIC Blood Culture results may not be optimal due to an inadequate volume of blood received in culture bottles Performed at [place] [address], [place], [place] [address]    Culture PENDING  Incomplete   Report Status PENDING  Incomplete  Culture, blood (Routine X 2) w Reflex to ID Panel     Status: None (Preliminary result)   Collection Time: 03/13/19  4:43 PM   Specimen: BLOOD RIGHT HAND  Result Value Ref Range Status   Specimen Description   Final    BLOOD RIGHT HAND Performed at [facility], [place] [address] [place], [place] [address]    Special Requests   Final    BOTTLES DRAWN AEROBIC ONLY Blood Culture results may not be optimal due to an inadequate volume of blood received in culture bottles Performed at [place] [address] [place], [place] [address]    Culture PENDING  Incomplete   Report Status PENDING  Incomplete  SARS Coronavirus 2 [facility] order, Performed in [facility] hospital lab) Nasopharyngeal Nasopharyngeal Swab     Status: None   Collection Time: 03/13/19  4:43 PM   Specimen: Nasopharyngeal Swab  Result Value Ref Range Status   SARS Coronavirus 2 NEGATIVE NEGATIVE Final    Comment: (NOTE) If result is NEGATIVE SARS-CoV-2 target nucleic acids are NOT DETECTED. The SARS-CoV-2 RNA is generally detectable in upper and lower  respiratory specimens during the acute phase of infection. The lowest  concentration of SARS-CoV-2 viral copies this assay can detect is 250  copies / mL. A negative result does not preclude SARS-CoV-2 infection  and should not be used as the sole basis for treatment or other  patient management decisions.  A negative result may occur with  improper specimen collection / handling, submission of specimen other  than nasopharyngeal swab, presence of viral mutation(s) within the  areas targeted by this assay, and inadequate number  of viral copies  (<250 copies / mL). A negative result must be combined with clinical  observations, patient history, and epidemiological information. If result is POSITIVE SARS-CoV-2 target nucleic acids are DETECTED. The SARS-CoV-2 RNA is generally detectable in upper and lower  respiratory specimens dur ing the acute phase of infection.  Positive  results are indicative of active infection with SARS-CoV-2.  Clinical  correlation with patient history and other diagnostic information is  necessary to determine patient infection status.  Positive results do  not rule out bacterial infection or co-infection with other viruses. If result is PRESUMPTIVE POSTIVE SARS-CoV-2 nucleic acids MAY BE PRESENT.   A presumptive positive result was obtained on the submitted specimen  and confirmed on repeat testing.  While 2019 novel coronavirus  (SARS-CoV-2) nucleic acids may be present in the submitted sample  additional confirmatory testing may be necessary for epidemiological  and / or clinical management purposes  to differentiate between  SARS-CoV-2 and other Sarbecovirus currently known to infect humans.  If clinically indicated additional testing with an alternate test  methodology (  [id number]) is advised. The SARS-CoV-2 RNA is generally  detectable in upper and lower respiratory sp ecimens during the acute  phase of infection. The expected result is Negative. Fact Sheet for Patients:  StrictlyIdeas.no Fact Sheet for Healthcare Providers: BankingDealers.co.za This test is not yet approved or cleared by the [place] FDA and has been authorized for detection and/or diagnosis of SARS-CoV-2 by FDA under an Emergency Use Authorization (EUA).  This EUA will remain in effect (meaning this test can be used) for the duration of the COVID-19 declaration under Section 564(b)(1) of the Act, 21 U.S.C. section 360bbb-3(b)(1), unless the authorization is  terminated or revoked sooner. Performed at [facility] [place] [address] [place], [place] [address]   Wound or Superficial Culture     Status: None (Preliminary result)   Collection Time: 03/13/19  4:43 PM   Specimen: Scrotum; Wound  Result Value Ref Range Status   Specimen Description   Final    SCROTUM Performed at [place] [address] [place], [place] [address]    Special Requests   Final    NONE Performed at [facility], [place] [address], [place], [place] [address]    Gram Stain   Final    FEW WBC PRESENT, PREDOMINANTLY PMN MODERATE GRAM POSITIVE RODS RARE GRAM NEGATIVE RODS RARE GRAM POSITIVE COCCI IN CLUSTERS Performed at [facility], [place] [address], [place], [place] [address]    Culture PENDING  Incomplete   Report Status PENDING  Incomplete   Creatinine: Recent Labs    03/13/19 1643  CREATININE 2.70*    Impression/Assessment/plan:  #1 acute kidney injury, elevated lactic acid-agree with fluid resuscitation and antibiotics.  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 I spoke with Dr. Kieth Brightly to take a look at the patient and CT.   #2 urinary fistula, rectourethral fistula- patient is leaking urine through 3 different fistula along the urethra and through a large rectourethral fistula -I think this explains the fluid tracking in the air on the CT.  We talked about the nature risk and benefits of a suprapubic tube but he is not sure if he wants to have another bag versus just continuing to leak urine through the fistula and evacuating urine per rectum.  He will consider.  I notified Dr. Tresa Moore of his admission. I made him NPO in even he needs procedure/SP tube tomorrow.   Festus Aloe 03/13/2019, 8:53 PM

## 2019-03-13 NOTE — ED Triage Notes (Signed)
Pt states that he has a "hole in his scrotum" since Friday.  Pt has hx of radiation, chemo for testicular CA.  Pt recently dx with pelvic infection by urology on Tuesday.

## 2019-03-13 NOTE — ED Provider Notes (Signed)
[facility] DEPT Provider Note   CSN: [id number] Arrival date & time: 03/13/19  1443    History   Chief Complaint Chief Complaint  Patient presents with  . Testicle Pain    HPI Phillip Blackwell is a 58 y.o. male.     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  States that it started draining 5 days ago.  Was started on antibiotics by his urologist.  States that the draining has worsened he is developed generalized fatigue and dyspnea with mild exertion.  No definite fever or chills.  States wounds have been draining a yellow purulent material.  Denies cough, chest pain, abdominal pain. Past Medical History:  Diagnosis Date  . Anxiety   . Cancer ([place]) 2016  . Depression   . GERD (gastroesophageal reflux disease)   . Hypertension   . Rectal bleeding 07/07/2018  . Rectal ulceration   . Sleep apnea   . Stroke [facility])    2014    Patient Active Problem List   Diagnosis Date Noted  . UTI (urinary tract infection) 10/18/2018  . Acute blood loss anemia 10/18/2018  . Rectal ulceration   . Symptomatic anemia 10/17/2018  . Rectal bleeding 10/17/2018  . Anemia   . Malnutrition of moderate degree 08/19/2018  . SIRS (systemic inflammatory response syndrome) ([place]) 08/18/2018  . Severe sepsis ([place]) 08/14/2018  . AKI (acute kidney injury) ([place]) 08/14/2018  . Serum total bilirubin elevated 08/14/2018  . Hematochezia 08/14/2018  . Hyponatremia 08/14/2018  . Normal anion gap metabolic acidosis 73/42/8768  . 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 OSA (obstructive sleep apnea) 08/14/2018  . Lower GI bleed 07/07/2018  . HTN (hypertension) 07/07/2018  . HLD (hyperlipidemia) 07/07/2018  . Prostate cancer ([place]) 07/07/2018    Past Surgical History:  Procedure Laterality Date  . BIOPSY  07/10/2018   Procedure: BIOPSY;  Surgeon: Otis Brace, MD;  Location: [facility];  Service:  Gastroenterology;;  . Consuela Mimes N/A 08/18/2018   Procedure: CYSTOSCOPY;  Surgeon: Alexis Frock, MD;  Location: [place];  Service: Urology;  Laterality: N/A;  . FLEXIBLE SIGMOIDOSCOPY N/A 07/10/2018   Procedure: FLEXIBLE SIGMOIDOSCOPY;  Surgeon: Otis Brace, MD;  Location: [facility];  Service: Gastroenterology;  Laterality: N/A;  Adult EGD Scope. Peds biopsy forceps.  Marland Kitchen FLEXIBLE SIGMOIDOSCOPY N/A 10/19/2018   Procedure: FLEXIBLE SIGMOIDOSCOPY;  Surgeon: Clarene Essex, MD;  Location: [facility];  Service: Endoscopy;  Laterality: N/A;  . HERNIA REPAIR    . IRRIGATION AND DEBRIDEMENT ABSCESS N/A 08/18/2018   Procedure: IRRIGATION AND DEBRIDEMENT penal scrotal ABSCESS;  Surgeon: Alexis Frock, MD;  Location: [place] [facility];  Service: Urology;  Laterality: N/A;  . IRRIGATION AND DEBRIDEMENT ABSCESS N/A 09/01/2018   Procedure: IRRIGATION AND DEBRIDEMENT ABSCESS;  Surgeon: Alexis Frock, MD;  Location: [place];  Service: Urology;  Laterality: N/A;  . LAPAROSCOPIC PARTIAL COLECTOMY N/A 08/23/2018   Procedure: LAPAROSCOPIC  END COLOSTOMY CREATION;  Surgeon: Donnie Mesa, MD;  Location: [facility];  Service: General;  Laterality: N/A;  . SCROTAL EXPLORATION N/A 08/23/2018   Procedure: SECONDARY PENILE/SCROTUM DEBRIDEMENT;  Surgeon: Alexis Frock, MD;  Location: [place];  Service: Urology;  Laterality: N/A;  . SCROTAL EXPLORATION N/A 08/26/2018   Procedure: THIRD STAGE PENIS AND SCROTAL IRRIGATION AND  DEBRIDEMENT;  Surgeon: Alexis Frock, MD;  Location: [facility];  Service: Urology;  Laterality: N/A;        Home Medications    Prior to Admission medications  Medication Sig Start Date End Date Taking? Authorizing Provider  amLODipine (NORVASC) 10 MG tablet Take 10 mg by mouth daily.   Yes [provider]  atorvastatin (LIPITOR) 40 MG tablet Take 40 mg by mouth daily with supper.    Yes [provider]  chlorproMAZINE (THORAZINE) 25 MG tablet Take 25 mg by mouth at bedtime.   Yes [provider]  Cholecalciferol (VITAMIN D3) 2000 units TABS Take 2,000 Units by mouth daily.    Yes [provider]  clonazePAM (KLONOPIN) 1 MG tablet Take 1 mg by mouth at bedtime.    Yes [provider]  Docusate Sodium (STOOL SOFTENER LAXATIVE PO) Take 1 tablet by mouth 2 (two) times a week.   Yes [provider]  DULoxetine (CYMBALTA) 60 MG capsule Take 60 mg by mouth daily with supper.    Yes [provider]  ferrous sulfate 325 (65 FE) MG tablet Take 1 tablet (325 mg total) by mouth daily with breakfast. Patient taking differently: Take 325 mg by mouth 3 (three) times a week.  07/11/18  Yes Thurnell Lose, MD  gabapentin (NEURONTIN) 300 MG capsule Take 300 mg by mouth 2 (two) times a day.    Yes [provider]  LACTOBACILLUS PO Take 1 capsule by mouth daily.    Yes [provider]  metoprolol tartrate (LOPRESSOR) 100 MG tablet Take 100 mg by mouth daily.    Yes [provider]  Multiple Vitamins-Minerals (MULTIVITAMIN WITH MINERALS) tablet Take 1 tablet by mouth daily.   Yes [provider]  omeprazole (PRILOSEC) 40 MG capsule Take 40 mg by mouth daily as needed (indigestion.).    Yes [provider]  polycarbophil (FIBERCON) 625 MG tablet Take 625 mg by mouth 3 (three) times a week.    Yes [provider]  prazosin (MINIPRESS) 1 MG capsule Take 1 mg by mouth at bedtime.    Yes [provider]  sulfamethoxazole-trimethoprim (BACTRIM DS) 800-160 MG tablet Take 1 tablet by mouth 2 (two) times a day. 03/08/19  Yes [provider]  traMADol (ULTRAM) 50 MG tablet Take 50 mg by mouth 3 (three) times daily.    Yes [provider]  Gauze Pads & Dressings (DRESSING SPONGES) 4"X4" PADS Provide 1 month supply for daily wet and dry dressing - along with sterile Saline bottle. 09/03/18   Thurnell Lose, MD  oxyCODONE (OXY IR/ROXICODONE) 5 MG immediate release tablet Take 1 tablet (5 mg  total) by mouth 3 (three) times daily as needed for moderate pain (unrelieved by Hydrocodone). Patient not taking: Reported on 10/17/2018 09/18/18   Kayleen Memos, DO    Family History Family History  Problem Relation Age of Onset  . Alzheimer's disease Mother     Social History Social History   Tobacco Use  . Smoking status: Never Smoker  . Smokeless tobacco: Never Used  Substance Use Topics  . Alcohol use: No  . Drug use: No     Allergies   Patient has no known allergies.   Review of Systems Review of Systems  Constitutional: Positive for fatigue. Negative for chills and fever.  HENT: Negative for trouble swallowing.   Respiratory: Positive for shortness of breath. Negative for cough.   Cardiovascular: Negative for chest pain, palpitations and leg swelling.  Gastrointestinal: Negative for abdominal pain, constipation, diarrhea, nausea and vomiting.  Musculoskeletal: Negative for back pain, myalgias and neck pain.  Skin: Negative for rash and wound.  Neurological: Negative  for dizziness, weakness, light-headedness, numbness and headaches.  All other systems reviewed and are negative.    Physical Exam Updated Vital Signs BP (!) 147/64   Pulse 98   Temp 99.7 F (37.6 C) (Oral)   Resp 15   Wt 73.5 kg   SpO2 98%   BMI 21.97 kg/m   Physical Exam Vitals signs and nursing note reviewed.  Constitutional:      General: He is not in acute distress.    Appearance: Normal appearance. He is well-developed. He is not ill-appearing.  HENT:     Head: Normocephalic and atraumatic.     Mouth/Throat:     Mouth: Mucous membranes are moist.  Eyes:     Extraocular Movements: Extraocular movements intact.     Pupils: Pupils are equal, round, and reactive to light.  Neck:     Musculoskeletal: Normal range of motion and neck supple.  Cardiovascular:     Rate and Rhythm: Normal rate and regular rhythm.  Pulmonary:     Effort: Pulmonary effort is normal.     Breath sounds:  Normal breath sounds.  Abdominal:     General: Bowel sounds are normal.     Palpations: Abdomen is soft.     Tenderness: There is no abdominal tenderness. There is no guarding or rebound.     Comments: Left lower quadrant ostomy in place.  Pink stoma.  Brown stool.  Abdomen is soft and nontender.  Genitourinary:    Comments: Patient has roughly 1 cm in diameter ulceration at the superior base of the penis.  Patient also has roughly 1 cm ulceration to the anterior scrotum.  Both are draining thick yellow purulent material.  Patient has no testicular tenderness.  No crepitance is appreciated. Musculoskeletal: Normal range of motion.        General: No tenderness.  Skin:    General: Skin is warm and dry.     Capillary Refill: Capillary refill takes less than 2 seconds.     Findings: No erythema or rash.  Neurological:     General: No focal deficit present.     Mental Status: He is alert and oriented to person, place, and time.  Psychiatric:        Behavior: Behavior normal.      ED Treatments / Results  Labs (all labs ordered are listed, but only abnormal results are displayed) Labs Reviewed  CBC WITH DIFFERENTIAL/PLATELET - Abnormal; Notable for the following components:      Result Value   WBC 16.9 (*)    RBC 3.19 (*)    Hemoglobin 7.4 (*)    HCT 27.2 (*)    MCH 23.2 (*)    MCHC 27.2 (*)    RDW 18.4 (*)    Platelets 541 (*)    Neutro Abs 14.3 (*)    Monocytes Absolute 1.2 (*)    Abs Immature Granulocytes 0.14 (*)    All other components within normal limits  COMPREHENSIVE METABOLIC PANEL - Abnormal; Notable for the following components:   Potassium 5.7 (*)    CO2 15 (*)    BUN 51 (*)    Creatinine, Ser 2.70 (*)    Total Protein 9.3 (*)    Albumin 3.1 (*)    AST 50 (*)    ALT 64 (*)    GFR calc non Af Amer 25 (*)    GFR calc Af Amer 29 (*)    All other components within normal limits  LACTIC ACID, PLASMA -  Abnormal; Notable for the following components:   Lactic  Acid, Venous 2.5 (*)    All other components within normal limits  CULTURE, BLOOD (ROUTINE X 2)  CULTURE, BLOOD (ROUTINE X 2)  SARS CORONAVIRUS 2 (HOSPITAL ORDER, [facility])  AEROBIC CULTURE (SUPERFICIAL SPECIMEN)  URINALYSIS, ROUTINE W REFLEX MICROSCOPIC  TYPE AND SCREEN    EKG None  Radiology Ct Abdomen Pelvis Wo Contrast  Result Date: 03/13/2019 CLINICAL DATA:  Evaluate for abdominal infection. EXAM: CT ABDOMEN AND PELVIS WITHOUT CONTRAST TECHNIQUE: Multidetector CT imaging of the abdomen and pelvis was performed following the standard protocol without IV contrast. COMPARISON:  08/13/2018 FINDINGS: Lower chest: No acute abnormality. Hepatobiliary: No focal liver abnormality is seen. No gallstones, gallbladder wall thickening, or biliary dilatation. Pancreas: Unremarkable. No pancreatic ductal dilatation or surrounding inflammatory changes. Spleen: Normal in size without focal abnormality. Adrenals/Urinary Tract: Normal appearance of the adrenal glands. The kidneys are normal. No mass or hydronephrosis. Diffuse bladder wall thickening is identified, similar to the previous exam. Mild pericystic fat stranding. Stomach/Bowel: Stomach is normal. No bowel wall thickening, distension or inflammation. Left lower quadrant colostomy. Vascular/Lymphatic: Aortic atherosclerosis. No aneurysm. No enlarged lymph nodes within the abdomen. Prominent left pelvic sidewall lymph node measures 1 cm, image 75/2. Previously 0.8 cm. Reproductive: Deep pelvic fluid collection involving the prostate gland and surrounding soft tissues . This measures 4.4 by 5.9 by 3.7 cm, image 86/2 and image 78/4. The fluid collection and inflammatory changes extend ventrally to involve the pubic symphysis. Although limited due to lack of IV contrast material there appears to be direct communication of this fluid collection with the floor of the urinary bladder, image 64/5. Further, ventral extension to involve  the lower rectus musculature is identified and pubic symphysis noted. Gas is now seen within the pubic symphysis with evidence of cortical erosion compatible with osteomyelitis. Intramuscular gas is now seen within the rectus musculature just above the pubic symphysis reflecting myositis, necrotizing myositis not excluded. Inflammatory changes continue ventrally with there is soft tissue gas and thickening just above the base of penis is identified, image 79/5. Other: None Musculoskeletal: Slight widening with cortical erosions along the articular surface of the pubic symphysis is identified consistent with infectious/inflammatory arthropathy, image 85/2. IMPRESSION: 1. Exam detail is diminished secondary to lack of IV contrast material. Prostatic and periprostatic fluid collection is identified within the floor of pelvis. Foci of gas are identified within this fluid collection compatible with abscess abscess. This fluid collection appears to directly communicate within the anterior floor of the urinary bladder. There is also ventral extension of infection to involve the pubic symphysis where there is evidence of inflammatory arthropathy and possible osteomyelitis. Continued ventral extension of inflammation involves the rectus musculature where intramuscular gas is noted, cannot exclude necrotizing myositis. 2. Bladder wall thickening and surrounding fat stranding compatible with cystitis. Electronically Signed   By: Kerby Moors M.D.   On: 03/13/2019 18:01   Dg Chest Port 1 View  Result Date: 03/13/2019 CLINICAL DATA:  Short of breath. EXAM: PORTABLE CHEST 1 VIEW COMPARISON:  10/17/2018 FINDINGS: The heart size and mediastinal contours are within normal limits. Both lungs are clear. The visualized skeletal structures are unremarkable. IMPRESSION: No active disease. Electronically Signed   By: Kerby Moors M.D.   On: 03/13/2019 17:30    Procedures Procedures (including critical care time)  Medications  Ordered in ED Medications  sodium bicarbonate injection 50 mEq (has no administration in time range)  calcium gluconate 1 g/ 50 mL sodium chloride IVPB (has no administration in time range)  sodium polystyrene (KAYEXALATE) 15 GM/60ML suspension 15 g (has no administration in time range)  sodium chloride 0.9 % bolus 1,000 mL (0 mLs Intravenous Stopped 03/13/19 1925)  vancomycin (VANCOCIN) IVPB 1000 mg/200 mL premix (0 mg Intravenous Stopped 03/13/19 1900)  ceFEPIme (MAXIPIME) 2 g in sodium chloride 0.9 % 100 mL IVPB (0 g Intravenous Stopped 03/13/19 1801)   CRITICAL CARE Performed by: Julianne Rice Total critical care time: 30 minutes Critical care time was exclusive of separately billable procedures and treating other patients. Critical care was necessary to treat or prevent imminent or life-threatening deterioration. Critical care was time spent personally by me on the following activities: development of treatment plan with patient and/or surrogate as well as nursing, discussions with consultants, evaluation of patient's response to treatment, examination of patient, obtaining history from patient or surrogate, ordering and performing treatments and interventions, ordering and review of laboratory studies, ordering and review of radiographic studies, pulse oximetry and re-evaluation of patient's condition.  Initial Impression / Assessment and Plan / ED Course  I have reviewed the triage vital signs and the nursing notes.  Pertinent labs & imaging results that were available during my care of the patient were reviewed by me and considered in my medical decision making (see chart for details).        Discussed CT results with Dr. Junious Silk.  Agrees with IV fluids and broad-spectrum antibiotics.  Will consult on patient.  Patient is hemodynamically stable.  Discussed with hospitalist who will see patient. Final Clinical Impressions(s) / ED Diagnoses   Final diagnoses:  Pelvic abscess in male  [facility])  AKI (acute kidney injury) ([place])  Anemia, unspecified type    ED Discharge Orders    None       Julianne Rice, MD 03/13/19 1931

## 2019-03-14 ENCOUNTER — Encounter (HOSPITAL_BASED_OUTPATIENT_CLINIC_OR_DEPARTMENT_OTHER): Payer: No Typology Code available for payment source | Attending: Internal Medicine

## 2019-03-14 ENCOUNTER — Other Ambulatory Visit: Payer: Self-pay

## 2019-03-14 DIAGNOSIS — Z8546 Personal history of malignant neoplasm of prostate: Secondary | ICD-10-CM | POA: Insufficient documentation

## 2019-03-14 DIAGNOSIS — K604 Rectal fistula: Secondary | ICD-10-CM | POA: Insufficient documentation

## 2019-03-14 DIAGNOSIS — Y842 Radiological procedure and radiotherapy as the cause of abnormal reaction of the patient, or of later complication, without mention of misadventure at the time of the procedure: Secondary | ICD-10-CM | POA: Insufficient documentation

## 2019-03-14 DIAGNOSIS — K627 Radiation proctitis: Secondary | ICD-10-CM | POA: Insufficient documentation

## 2019-03-14 DIAGNOSIS — K651 Peritoneal abscess: Secondary | ICD-10-CM

## 2019-03-14 LAB — COMPREHENSIVE METABOLIC PANEL
ALT: 56 U/L — ABNORMAL HIGH (ref 0–44)
AST: 44 U/L — ABNORMAL HIGH (ref 15–41)
Albumin: 2.5 g/dL — ABNORMAL LOW (ref 3.5–5.0)
Alkaline Phosphatase: 99 U/L (ref 38–126)
Anion gap: 9 (ref 5–15)
BUN: 43 mg/dL — ABNORMAL HIGH (ref 6–20)
CO2: 20 mmol/L — ABNORMAL LOW (ref 22–32)
Calcium: 9 mg/dL (ref 8.9–10.3)
Chloride: 109 mmol/L (ref 98–111)
Creatinine, Ser: 2.3 mg/dL — ABNORMAL HIGH (ref 0.61–1.24)
GFR calc Af Amer: 35 mL/min — ABNORMAL LOW (ref >60)
GFR calc non Af Amer: 30 mL/min — ABNORMAL LOW (ref >60)
Glucose, Bld: 106 mg/dL — ABNORMAL HIGH (ref 70–99)
Potassium: 5.1 mmol/L (ref 3.5–5.1)
Sodium: 138 mmol/L (ref 135–145)
Total Bilirubin: 0.6 mg/dL (ref 0.3–1.2)
Total Protein: 7.9 g/dL (ref 6.5–8.1)

## 2019-03-14 LAB — VITAMIN B12: Vitamin B-12: 414 pg/mL (ref 180–914)

## 2019-03-14 LAB — CBC WITH DIFFERENTIAL/PLATELET
Abs Immature Granulocytes: 0.1 10*3/uL — ABNORMAL HIGH (ref 0.00–0.07)
Basophils Absolute: 0.1 10*3/uL (ref 0.0–0.1)
Basophils Relative: 0 %
Eosinophils Absolute: 0.2 10*3/uL (ref 0.0–0.5)
Eosinophils Relative: 1 %
HCT: 26 % — ABNORMAL LOW (ref 39.0–52.0)
Hemoglobin: 7.5 g/dL — ABNORMAL LOW (ref 13.0–17.0)
Immature Granulocytes: 1 %
Lymphocytes Relative: 11 %
Lymphs Abs: 1.4 10*3/uL (ref 0.7–4.0)
MCH: 24.1 pg — ABNORMAL LOW (ref 26.0–34.0)
MCHC: 28.8 g/dL — ABNORMAL LOW (ref 30.0–36.0)
MCV: 83.6 fL (ref 80.0–100.0)
Monocytes Absolute: 1.1 10*3/uL — ABNORMAL HIGH (ref 0.1–1.0)
Monocytes Relative: 9 %
Neutro Abs: 10 10*3/uL — ABNORMAL HIGH (ref 1.7–7.7)
Neutrophils Relative %: 78 %
Platelets: 462 10*3/uL — ABNORMAL HIGH (ref 150–400)
RBC: 3.11 MIL/uL — ABNORMAL LOW (ref 4.22–5.81)
RDW: 17.5 % — ABNORMAL HIGH (ref 11.5–15.5)
WBC: 12.8 10*3/uL — ABNORMAL HIGH (ref 4.0–10.5)
nRBC: 0 % (ref 0.0–0.2)

## 2019-03-14 LAB — LACTIC ACID, PLASMA: Lactic Acid, Venous: 0.8 mmol/L (ref 0.5–1.9)

## 2019-03-14 LAB — FERRITIN: Ferritin: 177 ng/mL (ref 24–336)

## 2019-03-14 LAB — CBC
HCT: 21.8 % — ABNORMAL LOW (ref 39.0–52.0)
Hemoglobin: 6.2 g/dL — CL (ref 13.0–17.0)
MCH: 22.9 pg — ABNORMAL LOW (ref 26.0–34.0)
MCHC: 28.4 g/dL — ABNORMAL LOW (ref 30.0–36.0)
MCV: 80.4 fL (ref 80.0–100.0)
Platelets: 455 10*3/uL — ABNORMAL HIGH (ref 150–400)
RBC: 2.71 MIL/uL — ABNORMAL LOW (ref 4.22–5.81)
RDW: 18.4 % — ABNORMAL HIGH (ref 11.5–15.5)
WBC: 12.3 10*3/uL — ABNORMAL HIGH (ref 4.0–10.5)
nRBC: 0 % (ref 0.0–0.2)

## 2019-03-14 LAB — IRON AND TIBC
Iron: 84 ug/dL (ref 45–182)
Saturation Ratios: 27 % (ref 17.9–39.5)
TIBC: 306 ug/dL (ref 250–450)
UIBC: 222 ug/dL

## 2019-03-14 LAB — TSH: TSH: 4.138 u[IU]/mL (ref 0.350–4.500)

## 2019-03-14 LAB — PREPARE RBC (CROSSMATCH)

## 2019-03-14 MED ORDER — CHLORHEXIDINE GLUCONATE 0.12 % MT SOLN
15.0000 mL | Freq: Two times a day (BID) | OROMUCOSAL | Status: DC
  Administered 2019-03-14 – 2019-03-17 (×6): 15 mL via OROMUCOSAL
  Filled 2019-03-14 (×6): qty 15

## 2019-03-14 MED ORDER — VANCOMYCIN HCL 500 MG IV SOLR
500.0000 mg | Freq: Once | INTRAVENOUS | Status: DC
  Filled 2019-03-14 (×2): qty 500

## 2019-03-14 MED ORDER — VANCOMYCIN HCL 500 MG IV SOLR
500.0000 mg | Freq: Once | INTRAVENOUS | Status: AC
  Administered 2019-03-14: 500 mg via INTRAVENOUS
  Filled 2019-03-14: qty 500

## 2019-03-14 MED ORDER — VANCOMYCIN HCL IN DEXTROSE 1-5 GM/200ML-% IV SOLN
1000.0000 mg | INTRAVENOUS | Status: DC
  Administered 2019-03-14 – 2019-03-15 (×2): 1000 mg via INTRAVENOUS
  Filled 2019-03-14 (×2): qty 200

## 2019-03-14 MED ORDER — ORAL CARE MOUTH RINSE: 15.0000 mL | Freq: Two times a day (BID) | OROMUCOSAL | Status: DC

## 2019-03-14 MED ORDER — SODIUM CHLORIDE 0.9 % IV BOLUS
1000.0000 mL | Freq: Once | INTRAVENOUS | Status: AC
  Administered 2019-03-14: 1000 mL via INTRAVENOUS

## 2019-03-14 MED ORDER — SODIUM CHLORIDE 0.9% IV SOLUTION
Freq: Once | INTRAVENOUS | Status: AC
  Administered 2019-03-14: 15:00:00 via INTRAVENOUS

## 2019-03-14 MED ORDER — CHLORHEXIDINE GLUCONATE CLOTH 2 % EX PADS
6.0000 | MEDICATED_PAD | Freq: Every day | CUTANEOUS | Status: DC
  Administered 2019-03-14 – 2019-03-16 (×4): 6 via TOPICAL

## 2019-03-14 NOTE — Progress Notes (Signed)
PROGRESS NOTE    Phillip Blackwell  NID:[id number] DOB: 1961/05/07 DOA: 03/13/2019 PCP: [facility]   Brief Narrative:   Phillip Blackwell  is a 58 y.o. male, w hypertension, hyperlipidemia, h/o CVA, OSA, prostate cancer s/p  XRT apparently presents with a new draining fistula through the anterior scrotum. He underwent CT of the abdomen and pelvis which revealed a pelvic fluid collection with some air tracking along the anterior bladder and around the pubic bone. He was admitted to [facility] for priprostatic abscess.   Assessment & Plan:   Principal Problem:   Abscess Active Problems:   Anemia   Hyperkalemia   ARF (acute renal failure) (HCC)   Abnormal liver function   Periprostatic abscess:  Continue with broad spectrum IV antibiotics.  He is currently afebrile maintaining map greater than 60 mm HG. leukocytosis improving with IV antibiotics.  Lactic acid needs to be repeated. Appreciate urology and surgery recommendations.  Pain control  Patient had questions about suprapubic catheter placement as recommended by urology  Acute renal failure with hyperkalemia Probably secondary to infection and prerenal azotemia. Continue with IV fluid resuscitation and repeat renal parameters in the morning. CT of the abdomen pelvis does not show any hydronephrosis.    Anemia of chronic disease Hemoglobin 6.2 this morning. Anemia panel reviewed 2 units of PRBC transfusion ordered. Repeat H&H posttransfusion.  Use to keep hemoglobin greater than 7.   History of CVA   Hypertension Blood pressure parameters are borderline we will hold amlodipine for now.   Hyperlipidemia Hold statins for mild elevated liver enzymes.   Prostate cancer Patient follows up with his oncologist at [facility]   DVT prophylaxis: SCDs Code Status: Full code Family Communication: None at bedside Disposition Plan: Pending clinical improvement  Consultants:   Surgery  Urology  Procedures: CT of the abdomen and  pelvis  antimicrobials: IV vancomycin and IV cefepime since 03/13/2019  Subjective: Patient reports pain is well controlled requesting food.  Objective: Vitals:   03/14/19 0833 03/14/19 0900 03/14/19 1000 03/14/19 1100  BP:  (!) 118/45 (!) 127/55 (!) 103/41  Pulse:  (!) 56 60 (!) 57  Resp:  18 14 16   Temp: 97.9 F (36.6 C)     TempSrc: Oral     SpO2:  100% 100% 91%  Weight:      Height:        Intake/Output Summary (Last 24 hours) at 03/14/2019 1142 Last data filed at 03/14/2019 1114 Gross per 24 hour  Intake 4155.1 ml  Output 100 ml  Net 4055.1 ml   Filed Weights   03/13/19 1458 03/14/19 0420  Weight: 73.5 kg 74.7 kg    Examination:  General exam: Appears calm and comfortable  Respiratory system: Clear to auscultation. Respiratory effort normal. Cardiovascular system: S1 & S2 heard, RRR. No JVD,  Gastrointestinal system: Abdomen is nondistended, soft and nontender. No organomegaly or masses felt. Normal bowel sounds heard. Central nervous system: Alert and oriented. No focal neurological deficits. Extremities: Symmetric 5 x 5 power. Skin: No rashes, lesions or ulcers Psychiatry: Mood & affect appropriate.     Data Reviewed: I have personally reviewed following labs and imaging studies  CBC: Recent Labs  Lab 03/13/19 1643 03/14/19 0723  WBC 16.9* 12.3*  NEUTROABS 14.3*  --   HGB 7.4* 6.2*  HCT 27.2* 21.8*  MCV 85.3 80.4  PLT 541* 144*   Basic Metabolic Panel: Recent Labs  Lab 03/13/19 1643 03/14/19 0723  NA 135 138  K 5.7*  5.1  CL 105 109  CO2 15* 20*  GLUCOSE 90 106*  BUN 51* 43*  CREATININE 2.70* 2.30*  CALCIUM 9.5 9.0   GFR: Estimated Creatinine Clearance: 36.5 mL/min (A) (by C-G formula based on SCr of 2.3 mg/dL (H)). Liver Function Tests: Recent Labs  Lab 03/13/19 1643 03/14/19 0723  AST 50* 44*  ALT 64* 56*  ALKPHOS 124 99  BILITOT 0.7 0.6  PROT 9.3* 7.9  ALBUMIN 3.1* 2.5*   No results for input(s): LIPASE, AMYLASE in the last 168  hours. No results for input(s): AMMONIA in the last 168 hours. Coagulation Profile: No results for input(s): INR, PROTIME in the last 168 hours. Cardiac Enzymes: No results for input(s): CKTOTAL, CKMB, CKMBINDEX, TROPONINI in the last 168 hours. BNP (last 3 results) No results for input(s): PROBNP in the last 8760 hours. HbA1C: No results for input(s): HGBA1C in the last 72 hours. CBG: No results for input(s): GLUCAP in the last 168 hours. Lipid Profile: No results for input(s): CHOL, HDL, LDLCALC, TRIG, CHOLHDL, LDLDIRECT in the last 72 hours. Thyroid Function Tests: Recent Labs    03/14/19 0723  TSH 4.138   Anemia Panel: Recent Labs    03/14/19 0943  VITAMINB12 414  FERRITIN 177  TIBC 306  IRON 84   Sepsis Labs: Recent Labs  Lab 03/13/19 1643  LATICACIDVEN 2.5*    Recent Results (from the past 240 hour(s))  Culture, blood (Routine X 2) w Reflex to ID Panel     Status: None (Preliminary result)   Collection Time: 03/13/19  4:43 PM   Specimen: BLOOD RIGHT FOREARM  Result Value Ref Range Status   Specimen Description   Final    BLOOD RIGHT FOREARM Performed at [facility], [place] [address] [place], [place] [address]    Special Requests   Final    BOTTLES DRAWN AEROBIC AND ANAEROBIC Blood Culture results may not be optimal due to an inadequate volume of blood received in culture bottles Performed at [place] [address], [place], [place] [address]    Culture   Final    NO GROWTH < 24 HOURS Performed at [facility] [address], [place], [place] [address]    Report Status PENDING  Incomplete  Culture, blood (Routine X 2) w Reflex to ID Panel     Status: None (Preliminary result)   Collection Time: 03/13/19  4:43 PM   Specimen: BLOOD RIGHT HAND  Result Value Ref Range Status   Specimen Description   Final    BLOOD RIGHT HAND Performed at [facility], [place] [address] [place], [place] [address]    Special  Requests   Final    BOTTLES DRAWN AEROBIC ONLY Blood Culture results may not be optimal due to an inadequate volume of blood received in culture bottles Performed at [place] [address] [place], [place] [address]    Culture   Final    NO GROWTH < 24 HOURS Performed at [facility] [address] [place], [place] [address]    Report Status PENDING  Incomplete  SARS Coronavirus 2 [facility] order, Performed in [facility] hospital lab) Nasopharyngeal Nasopharyngeal Swab     Status: None   Collection Time: 03/13/19  4:43 PM   Specimen: Nasopharyngeal Swab  Result Value Ref Range Status   SARS Coronavirus 2 NEGATIVE NEGATIVE Final    Comment: (NOTE) If result is NEGATIVE SARS-CoV-2 target nucleic acids  are NOT DETECTED. The SARS-CoV-2 RNA is generally detectable in upper and lower  respiratory specimens during the acute phase of infection. The lowest  concentration of SARS-CoV-2 viral copies this assay can detect is 250  copies / mL. A negative result does not preclude SARS-CoV-2 infection  and should not be used as the sole basis for treatment or other  patient management decisions.  A negative result may occur with  improper specimen collection / handling, submission of specimen other  than nasopharyngeal swab, presence of viral mutation(s) within the  areas targeted by this assay, and inadequate number of viral copies  (<250 copies / mL). A negative result must be combined with clinical  observations, patient history, and epidemiological information. If result is POSITIVE SARS-CoV-2 target nucleic acids are DETECTED. The SARS-CoV-2 RNA is generally detectable in upper and lower  respiratory specimens dur ing the acute phase of infection.  Positive  results are indicative of active infection with SARS-CoV-2.  Clinical  correlation with patient history and other diagnostic information is  necessary to determine patient infection status.  Positive  results do  not rule out bacterial infection or co-infection with other viruses. If result is PRESUMPTIVE POSTIVE SARS-CoV-2 nucleic acids MAY BE PRESENT.   A presumptive positive result was obtained on the submitted specimen  and confirmed on repeat testing.  While 2019 novel coronavirus  (SARS-CoV-2) nucleic acids may be present in the submitted sample  additional confirmatory testing may be necessary for epidemiological  and / or clinical management purposes  to differentiate between  SARS-CoV-2 and other Sarbecovirus currently known to infect humans.  If clinically indicated additional testing with an alternate test  methodology [phone]) is advised. The SARS-CoV-2 RNA is generally  detectable in upper and lower respiratory sp ecimens during the acute  phase of infection. The expected result is Negative. Fact Sheet for Patients:  StrictlyIdeas.no Fact Sheet for Healthcare Providers: BankingDealers.co.za This test is not yet approved or cleared by the [place] FDA and has been authorized for detection and/or diagnosis of SARS-CoV-2 by FDA under an Emergency Use Authorization (EUA).  This EUA will remain in effect (meaning this test can be used) for the duration of the COVID-19 declaration under Section 564(b)(1) of the Act, 21 U.S.C. section 360bbb-3(b)(1), unless the authorization is terminated or revoked sooner. Performed at [facility], [place] [address], [place], [place] [address]   Wound or Superficial Culture     Status: None (Preliminary result)   Collection Time: 03/13/19  4:43 PM   Specimen: Scrotum; Wound  Result Value Ref Range Status   Specimen Description   Final    SCROTUM Performed at [facility] [address], [place], [place] [address]    Special Requests   Final    NONE Performed at [facility], [place] [address], [place], [place] [address]    Gram  Stain   Final    FEW WBC PRESENT, PREDOMINANTLY PMN MODERATE GRAM POSITIVE RODS RARE GRAM NEGATIVE RODS RARE GRAM POSITIVE COCCI IN CLUSTERS    Culture   Final    TOO YOUNG TO READ Performed at [facility], [place] [address] [place], [place] [address]    Report Status PENDING  Incomplete         Radiology Studies: Ct Abdomen Pelvis Wo Contrast  Result Date: 03/13/2019 CLINICAL DATA:  Evaluate for abdominal infection. EXAM: CT ABDOMEN AND PELVIS WITHOUT CONTRAST TECHNIQUE: Multidetector CT imaging of the abdomen and  pelvis was performed following the standard protocol without IV contrast. COMPARISON:  08/13/2018 FINDINGS: Lower chest: No acute abnormality. Hepatobiliary: No focal liver abnormality is seen. No gallstones, gallbladder wall thickening, or biliary dilatation. Pancreas: Unremarkable. No pancreatic ductal dilatation or surrounding inflammatory changes. Spleen: Normal in size without focal abnormality. Adrenals/Urinary Tract: Normal appearance of the adrenal glands. The kidneys are normal. No mass or hydronephrosis. Diffuse bladder wall thickening is identified, similar to the previous exam. Mild pericystic fat stranding. Stomach/Bowel: Stomach is normal. No bowel wall thickening, distension or inflammation. Left lower quadrant colostomy. Vascular/Lymphatic: Aortic atherosclerosis. No aneurysm. No enlarged lymph nodes within the abdomen. Prominent left pelvic sidewall lymph node measures 1 cm, image 75/2. Previously 0.8 cm. Reproductive: Deep pelvic fluid collection involving the prostate gland and surrounding soft tissues . This measures 4.4 by 5.9 by 3.7 cm, image 86/2 and image 78/4. The fluid collection and inflammatory changes extend ventrally to involve the pubic symphysis. Although limited due to lack of IV contrast material there appears to be direct communication of this fluid collection with the floor of the urinary bladder, image 64/5. Further, ventral extension to involve  the lower rectus musculature is identified and pubic symphysis noted. Gas is now seen within the pubic symphysis with evidence of cortical erosion compatible with osteomyelitis. Intramuscular gas is now seen within the rectus musculature just above the pubic symphysis reflecting myositis, necrotizing myositis not excluded. Inflammatory changes continue ventrally with there is soft tissue gas and thickening just above the base of penis is identified, image 79/5. Other: None Musculoskeletal: Slight widening with cortical erosions along the articular surface of the pubic symphysis is identified consistent with infectious/inflammatory arthropathy, image 85/2. IMPRESSION: 1. Exam detail is diminished secondary to lack of IV contrast material. Prostatic and periprostatic fluid collection is identified within the floor of pelvis. Foci of gas are identified within this fluid collection compatible with abscess abscess. This fluid collection appears to directly communicate within the anterior floor of the urinary bladder. There is also ventral extension of infection to involve the pubic symphysis where there is evidence of inflammatory arthropathy and possible osteomyelitis. Continued ventral extension of inflammation involves the rectus musculature where intramuscular gas is noted, cannot exclude necrotizing myositis. 2. Bladder wall thickening and surrounding fat stranding compatible with cystitis. Electronically Signed   By: Kerby Moors M.D.   On: 03/13/2019 18:01   Dg Chest Port 1 View  Result Date: 03/13/2019 CLINICAL DATA:  Short of breath. EXAM: PORTABLE CHEST 1 VIEW COMPARISON:  10/17/2018 FINDINGS: The heart size and mediastinal contours are within normal limits. Both lungs are clear. The visualized skeletal structures are unremarkable. IMPRESSION: No active disease. Electronically Signed   By: Kerby Moors M.D.   On: 03/13/2019 17:30        Scheduled Meds:  sodium chloride   Intravenous Once    amLODipine  10 mg Oral Daily   atorvastatin  40 mg Oral Q supper   chlorhexidine  15 mL Mouth Rinse BID   Chlorhexidine Gluconate Cloth  6 each Topical Daily   chlorproMAZINE  25 mg Oral QHS   cholecalciferol  2,000 Units Oral Daily   clonazePAM  1 mg Oral QHS   DULoxetine  60 mg Oral Q supper   enoxaparin (LOVENOX) injection  30 mg Subcutaneous Q24H   ferrous sulfate  325 mg Oral 3 times weekly   gabapentin  300 mg Oral BID   mouth rinse  15 mL Mouth Rinse q12n4p   metoprolol tartrate  100 mg Oral Daily   multivitamin with minerals  1 tablet Oral Daily   pantoprazole  40 mg Oral Daily   polycarbophil  625 mg Oral 3 times weekly   prazosin  1 mg Oral QHS   traMADol  50 mg Oral TID   Continuous Infusions:  sodium chloride 125 mL/hr at 03/14/19 1114   ceFEPime (MAXIPIME) IV Stopped (03/14/19 1043)   vancomycin       LOS: 1 day    Time spent: Oretta    Hosie Poisson, MD [facility] Pager [phone] If 7PM-7AM, please contact night-coverage [email or web address] Password [email or web address] 03/14/2019, 11:42 AM

## 2019-03-14 NOTE — Progress Notes (Signed)
CRITICAL VALUE ALERT  Critical Value:  Hgb 6.2   Date & Time Notied:  8004 8/3  Provider Notified: Karleen Hampshire, MD  Orders Received/Actions taken: 2 U PRBC

## 2019-03-14 NOTE — Progress Notes (Signed)
Pharmacy Antibiotic Note  Phillip Blackwell is a 58 y.o. male admitted on 03/13/2019 with Periprostatic abscess ? Osteomyelitis.  Pharmacy has been consulted for Vancomycin, cefepime dosing, per H&P.  Plan: Vancomycin 1gm iv x1 (at 1800), then Vancomycin 500mg  iv x1, then  Vancomycin 1000 mg IV Q 24 hrs. Goal AUC 400-550. Expected AUC: 604.9  (but I expect SCr to improve and estimate AUC ~465) SCr used: 2.7  Cefepime 2gm iv x1, then 2gm iv q12hr  Height: 6' (182.9 cm) Weight: 162 lb (73.5 kg) IBW/kg (Calculated) : 77.6  Temp (24hrs), Avg:98.8 F (37.1 C), Min:98.3 F (36.8 C), Max:99.7 F (37.6 C)  Recent Labs  Lab 03/13/19 1643  WBC 16.9*  CREATININE 2.70*  LATICACIDVEN 2.5*    Estimated Creatinine Clearance: 30.6 mL/min (A) (by C-G formula based on SCr of 2.7 mg/dL (H)).    No Known Allergies  Antimicrobials this admission: Vancomycin 03/13/2019 >> Cefepime 03/13/2019 >>   Dose adjustments this admission: -  Microbiology results: -  Thank you for allowing pharmacy to be a part of this patient's care.  Nani Skillern Crowford 03/14/2019 1:08 AM

## 2019-03-14 NOTE — Progress Notes (Signed)
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

## 2019-03-15 LAB — BASIC METABOLIC PANEL
Anion gap: 8 (ref 5–15)
BUN: 28 mg/dL — ABNORMAL HIGH (ref 6–20)
CO2: 19 mmol/L — ABNORMAL LOW (ref 22–32)
Calcium: 8.9 mg/dL (ref 8.9–10.3)
Chloride: 106 mmol/L (ref 98–111)
Creatinine, Ser: 1.67 mg/dL — ABNORMAL HIGH (ref 0.61–1.24)
GFR calc Af Amer: 51 mL/min — ABNORMAL LOW (ref >60)
GFR calc non Af Amer: 44 mL/min — ABNORMAL LOW (ref >60)
Glucose, Bld: 100 mg/dL — ABNORMAL HIGH (ref 70–99)
Potassium: 4.4 mmol/L (ref 3.5–5.1)
Sodium: 133 mmol/L — ABNORMAL LOW (ref 135–145)

## 2019-03-15 LAB — TYPE AND SCREEN
ABO/RH(D): O POS
Antibody Screen: NEGATIVE
Unit division: 0
Unit division: 0

## 2019-03-15 LAB — BPAM RBC
Blood Product Expiration Date: 202008292359
Blood Product Expiration Date: 202008292359
ISSUE DATE / TIME: 202008031502
ISSUE DATE / TIME: 202008031757
Unit Type and Rh: 5100
Unit Type and Rh: 5100

## 2019-03-15 LAB — CBC
HCT: 26.8 % — ABNORMAL LOW (ref 39.0–52.0)
Hemoglobin: 7.8 g/dL — ABNORMAL LOW (ref 13.0–17.0)
MCH: 24.1 pg — ABNORMAL LOW (ref 26.0–34.0)
MCHC: 29.1 g/dL — ABNORMAL LOW (ref 30.0–36.0)
MCV: 82.7 fL (ref 80.0–100.0)
Platelets: 461 10*3/uL — ABNORMAL HIGH (ref 150–400)
RBC: 3.24 MIL/uL — ABNORMAL LOW (ref 4.22–5.81)
RDW: 17.5 % — ABNORMAL HIGH (ref 11.5–15.5)
WBC: 14.1 10*3/uL — ABNORMAL HIGH (ref 4.0–10.5)
nRBC: 0 % (ref 0.0–0.2)

## 2019-03-15 LAB — FOLATE RBC
Folate, Hemolysate: 420 ng/mL
Folate, RBC: 2019 ng/mL (ref >498)
Hematocrit: 20.8 % — ABNORMAL LOW (ref 37.5–51.0)

## 2019-03-15 NOTE — Progress Notes (Signed)
PROGRESS NOTE    Phillip Blackwell  Phillip Blackwell DOB: May 05, 1961 DOA: 03/13/2019 [facility]: [facility]   Brief Narrative:   Phillip Blackwell  is a 58 y.o. male, w hypertension, hyperlipidemia, h/o CVA, OSA, prostate cancer s/p  XRT apparently presents with a new draining fistula through the anterior scrotum. He underwent CT of the abdomen and pelvis which revealed a pelvic fluid collection with some air tracking along the anterior bladder and around the pubic bone. He was admitted to [facility] for priprostatic abscess.   Assessment & Plan:   Principal Problem:   Abscess Active Problems:   Anemia   Hyperkalemia   ARF (acute renal failure) (HCC)   Abnormal liver function   Periprostatic abscess:  Continue with broad spectrum IV antibiotics.  He is currently afebrile maintaining map greater than 60 mm HG.  Persistent leukocytosis but afebrile.  Lactic acid wnl. Further management as per Urology.    Acute renal failure with hyperkalemia Probably secondary to infection and prerenal azotemia. Continue with IV fluid resuscitation and repeat renal parameters in the morning show much improvement. Continue the same.  CT of the abdomen pelvis does not show any hydronephrosis.    Anemia of chronic disease S/p 2 units of prbc transfusion, repeat H&H is greater than 7 Anemia panel reviewed Transfuse to keep hemoglobin greater than 7.   History of CVA   Hypertension Well controlled.    Hyperlipidemia Hold statins for mild elevated liver enzymes.   Prostate cancer Patient follows up with his oncologist at [facility]   DVT prophylaxis: SCDs Code Status: Full code Family Communication: None at bedside Disposition Plan: Pending clinical improvement, transfer to floor today and start PT.   Consultants:   Surgery  Urology  Procedures: CT of the abdomen and pelvis  antimicrobials: IV vancomycin and IV cefepime since 03/13/2019  Subjective: Worried, not in distress. Has questions about  the surgery.   Objective: Vitals:   03/15/19 0630 03/15/19 0700 03/15/19 0800 03/15/19 0956  BP:  (!) 168/78 (!) 154/68 135/70  Pulse: 70 61 61 87  Resp: 13 16 15    Temp:   97.7 F (36.5 C)   TempSrc:   Oral   SpO2: 97% 99% 98%   Weight:      Height:        Intake/Output Summary (Last 24 hours) at 03/15/2019 1015 Last data filed at 03/14/2019 2335 Gross per 24 hour  Intake 1782.42 ml  Output --  Net 1782.42 ml   Filed Weights   03/13/19 1458 03/14/19 0420 03/15/19 0500  Weight: 73.5 kg 74.7 kg 74 kg    Examination:  General exam: Appears calm and comfortable , no distress noted.  Respiratory system: Clear to auscultation. Respiratory effort normal. No wheezing or rhonchi.  Cardiovascular system: S1 & S2 heard, RRR. No JVD,  Gastrointestinal system: Abdomen is soft, mildly tender in the lower quadrant.  Central nervous system: Alert and oriented. No focal neurological deficits. Extremities: Symmetric 5 x 5 power. Skin: No rashes, lesions or ulcers Psychiatry: Mood & affect appropriate.     Data Reviewed: I have personally reviewed following labs and imaging studies  CBC: Recent Labs  Lab 03/13/19 1643 03/14/19 0723 03/14/19 2321 03/15/19 0213  WBC 16.9* 12.3* 12.8* 14.1*  NEUTROABS 14.3*  --  10.0*  --   HGB 7.4* 6.2* 7.5* 7.8*  HCT 27.2* 21.8* 26.0* 26.8*  MCV 85.3 80.4 83.6 82.7  PLT 541* 455* 462* 361*   Basic Metabolic Panel: Recent Labs  Lab 03/13/19 1643 03/14/19 0723 03/15/19 0213  NA 135 138 133*  K 5.7* 5.1 4.4  CL 105 109 106  CO2 15* 20* 19*  GLUCOSE 90 106* 100*  BUN 51* 43* 28*  CREATININE 2.70* 2.30* 1.67*  CALCIUM 9.5 9.0 8.9   GFR: Estimated Creatinine Clearance: 49.9 mL/min (A) (by C-G formula based on SCr of 1.67 mg/dL (H)). Liver Function Tests: Recent Labs  Lab 03/13/19 1643 03/14/19 0723  AST 50* 44*  ALT 64* 56*  ALKPHOS 124 99  BILITOT 0.7 0.6  PROT 9.3* 7.9  ALBUMIN 3.1* 2.5*   No results for input(s): LIPASE,  AMYLASE in the last 168 hours. No results for input(s): AMMONIA in the last 168 hours. Coagulation Profile: No results for input(s): INR, PROTIME in the last 168 hours. Cardiac Enzymes: No results for input(s): CKTOTAL, CKMB, CKMBINDEX, TROPONINI in the last 168 hours. BNP (last 3 results) No results for input(s): PROBNP in the last 8760 hours. HbA1C: No results for input(s): HGBA1C in the last 72 hours. CBG: No results for input(s): GLUCAP in the last 168 hours. Lipid Profile: No results for input(s): CHOL, HDL, LDLCALC, TRIG, CHOLHDL, LDLDIRECT in the last 72 hours. Thyroid Function Tests: Recent Labs    03/14/19 0723  TSH 4.138   Anemia Panel: Recent Labs    03/14/19 0943  VITAMINB12 414  FERRITIN 177  TIBC 306  IRON 84   Sepsis Labs: Recent Labs  Lab 03/13/19 1643 03/14/19 2321  LATICACIDVEN 2.5* 0.8    Recent Results (from the past 240 hour(s))  Culture, blood (Routine X 2) w Reflex to ID Panel     Status: None (Preliminary result)   Collection Time: 03/13/19  4:43 PM   Specimen: BLOOD RIGHT FOREARM  Result Value Ref Range Status   Specimen Description   Final    BLOOD RIGHT FOREARM Performed at [place] [address] [place], [place] [address]    Special Requests   Final    BOTTLES DRAWN AEROBIC AND ANAEROBIC Blood Culture results may not be optimal due to an inadequate volume of blood received in culture bottles Performed at [place] [address] [place], [place] [address]    Culture   Final    NO GROWTH 2 DAYS Performed at [place] [address] [place], [place] [address]    Report Status PENDING  Incomplete  Culture, blood (Routine X 2) w Reflex to ID Panel     Status: None (Preliminary result)   Collection Time: 03/13/19  4:43 PM   Specimen: BLOOD RIGHT HAND  Result Value Ref Range Status   Specimen Description   Final    BLOOD RIGHT HAND Performed at [facility], [place] [address],  [place], [place] [address]    Special Requests   Final    BOTTLES DRAWN AEROBIC ONLY Blood Culture results may not be optimal due to an inadequate volume of blood received in culture bottles Performed at [place] [address] [place], [place] [address]    Culture   Final    NO GROWTH 2 DAYS Performed at [place] [address], [place], [place] [address]    Report Status PENDING  Incomplete  SARS Coronavirus 2 [facility] order, Performed in [facility] hospital lab) Nasopharyngeal Nasopharyngeal Swab     Status: None   Collection Time: 03/13/19  4:43 PM   Specimen: Nasopharyngeal Swab  Result Value Ref  Range Status   SARS Coronavirus 2 NEGATIVE NEGATIVE Final    Comment: (NOTE) If result is NEGATIVE SARS-CoV-2 target nucleic acids are NOT DETECTED. The SARS-CoV-2 RNA is generally detectable in upper and lower  respiratory specimens during the acute phase of infection. The lowest  concentration of SARS-CoV-2 viral copies this assay can detect is 250  copies / mL. A negative result does not preclude SARS-CoV-2 infection  and should not be used as the sole basis for treatment or other  patient management decisions.  A negative result may occur with  improper specimen collection / handling, submission of specimen other  than nasopharyngeal swab, presence of viral mutation(s) within the  areas targeted by this assay, and inadequate number of viral copies  (<250 copies / mL). A negative result must be combined with clinical  observations, patient history, and epidemiological information. If result is POSITIVE SARS-CoV-2 target nucleic acids are DETECTED. The SARS-CoV-2 RNA is generally detectable in upper and lower  respiratory specimens dur ing the acute phase of infection.  Positive  results are indicative of active infection with SARS-CoV-2.  Clinical  correlation with patient history and other diagnostic information is  necessary to determine patient  infection status.  Positive results do  not rule out bacterial infection or co-infection with other viruses. If result is PRESUMPTIVE POSTIVE SARS-CoV-2 nucleic acids MAY BE PRESENT.   A presumptive positive result was obtained on the submitted specimen  and confirmed on repeat testing.  While 2019 novel coronavirus  (SARS-CoV-2) nucleic acids may be present in the submitted sample  additional confirmatory testing may be necessary for epidemiological  and / or clinical management purposes  to differentiate between  SARS-CoV-2 and other Sarbecovirus currently known to infect humans.  If clinically indicated additional testing with an alternate test  methodology [phone]) is advised. The SARS-CoV-2 RNA is generally  detectable in upper and lower respiratory sp ecimens during the acute  phase of infection. The expected result is Negative. Fact Sheet for Patients:  StrictlyIdeas.no Fact Sheet for Healthcare Providers: BankingDealers.co.za This test is not yet approved or cleared by the [place] FDA and has been authorized for detection and/or diagnosis of SARS-CoV-2 by FDA under an Emergency Use Authorization (EUA).  This EUA will remain in effect (meaning this test can be used) for the duration of the COVID-19 declaration under Section 564(b)(1) of the Act, 21 U.S.C. section 360bbb-3(b)(1), unless the authorization is terminated or revoked sooner. Performed at [facility], [place] [address], [place], [place] [address]   Wound or Superficial Culture     Status: None (Preliminary result)   Collection Time: 03/13/19  4:43 PM   Specimen: Scrotum; Wound  Result Value Ref Range Status   Specimen Description   Final    SCROTUM Performed at [place] [address], [place], [place] [address]    Special Requests   Final    NONE Performed at [facility], [place] [address]  [place], [place] [address]    Gram Stain   Final    FEW WBC PRESENT, PREDOMINANTLY PMN MODERATE GRAM POSITIVE RODS RARE GRAM NEGATIVE RODS RARE GRAM POSITIVE COCCI IN CLUSTERS    Culture   Final    TOO YOUNG TO READ Performed at [facility], [place] [address] [place], [place] [address]    Report Status PENDING  Incomplete         Radiology Studies: Ct Abdomen Pelvis Wo Contrast  Result Date:  03/13/2019 CLINICAL DATA:  Evaluate for abdominal infection. EXAM: CT ABDOMEN AND PELVIS WITHOUT CONTRAST TECHNIQUE: Multidetector CT imaging of the abdomen and pelvis was performed following the standard protocol without IV contrast. COMPARISON:  08/13/2018 FINDINGS: Lower chest: No acute abnormality. Hepatobiliary: No focal liver abnormality is seen. No gallstones, gallbladder wall thickening, or biliary dilatation. Pancreas: Unremarkable. No pancreatic ductal dilatation or surrounding inflammatory changes. Spleen: Normal in size without focal abnormality. Adrenals/Urinary Tract: Normal appearance of the adrenal glands. The kidneys are normal. No mass or hydronephrosis. Diffuse bladder wall thickening is identified, similar to the previous exam. Mild pericystic fat stranding. Stomach/Bowel: Stomach is normal. No bowel wall thickening, distension or inflammation. Left lower quadrant colostomy. Vascular/Lymphatic: Aortic atherosclerosis. No aneurysm. No enlarged lymph nodes within the abdomen. Prominent left pelvic sidewall lymph node measures 1 cm, image 75/2. Previously 0.8 cm. Reproductive: Deep pelvic fluid collection involving the prostate gland and surrounding soft tissues . This measures 4.4 by 5.9 by 3.7 cm, image 86/2 and image 78/4. The fluid collection and inflammatory changes extend ventrally to involve the pubic symphysis. Although limited due to lack of IV contrast material there appears to be direct communication of this fluid collection with the floor of the urinary bladder, image 64/5.  Further, ventral extension to involve the lower rectus musculature is identified and pubic symphysis noted. Gas is now seen within the pubic symphysis with evidence of cortical erosion compatible with osteomyelitis. Intramuscular gas is now seen within the rectus musculature just above the pubic symphysis reflecting myositis, necrotizing myositis not excluded. Inflammatory changes continue ventrally with there is soft tissue gas and thickening just above the base of penis is identified, image 79/5. Other: None Musculoskeletal: Slight widening with cortical erosions along the articular surface of the pubic symphysis is identified consistent with infectious/inflammatory arthropathy, image 85/2. IMPRESSION: 1. Exam detail is diminished secondary to lack of IV contrast material. Prostatic and periprostatic fluid collection is identified within the floor of pelvis. Foci of gas are identified within this fluid collection compatible with abscess abscess. This fluid collection appears to directly communicate within the anterior floor of the urinary bladder. There is also ventral extension of infection to involve the pubic symphysis where there is evidence of inflammatory arthropathy and possible osteomyelitis. Continued ventral extension of inflammation involves the rectus musculature where intramuscular gas is noted, cannot exclude necrotizing myositis. 2. Bladder wall thickening and surrounding fat stranding compatible with cystitis. Electronically Signed   By: Kerby Moors M.D.   On: 03/13/2019 18:01   Dg Chest Port 1 View  Result Date: 03/13/2019 CLINICAL DATA:  Short of breath. EXAM: PORTABLE CHEST 1 VIEW COMPARISON:  10/17/2018 FINDINGS: The heart size and mediastinal contours are within normal limits. Both lungs are clear. The visualized skeletal structures are unremarkable. IMPRESSION: No active disease. Electronically Signed   By: Kerby Moors M.D.   On: 03/13/2019 17:30        Scheduled Meds:   atorvastatin  40 mg Oral Q supper   chlorhexidine  15 mL Mouth Rinse BID   Chlorhexidine Gluconate Cloth  6 each Topical Daily   chlorproMAZINE  25 mg Oral QHS   cholecalciferol  2,000 Units Oral Daily   clonazePAM  1 mg Oral QHS   DULoxetine  60 mg Oral Q supper   ferrous sulfate  325 mg Oral 3 times weekly   gabapentin  300 mg Oral BID   mouth rinse  15 mL Mouth Rinse q12n4p   metoprolol tartrate  100 mg  Oral Daily   multivitamin with minerals  1 tablet Oral Daily   pantoprazole  40 mg Oral Daily   polycarbophil  625 mg Oral 3 times weekly   prazosin  1 mg Oral QHS   traMADol  50 mg Oral TID   Continuous Infusions:  ceFEPime (MAXIPIME) IV 2 g (03/15/19 1010)   vancomycin Stopped (03/14/19 2334)     LOS: 2 days    Time spent: 32 minutes    Hosie Poisson, MD [facility] Pager [phone] If 7PM-7AM, please contact night-coverage [email or web address] Password [email or web address] 03/15/2019, 10:15 AM

## 2019-03-15 NOTE — Progress Notes (Signed)
Pts transferred to room 1432 via wheelchair with family member present. Pt denies pain at this time with no s/s of distress noted. Pt and family member educated and orientated to the room and unit policies. Pt given hydration and call bell placed within reach.

## 2019-03-15 NOTE — Plan of Care (Signed)

## 2019-03-15 NOTE — TOC Initial Note (Signed)
Transition of Care [facility]) - Initial/Assessment Note    Patient Details  Name: Phillip Blackwell MRN: [id number] Date of Birth: November 04, 1960  [facility] CM/SW Contact:    Lynnell Catalan, RN Phone Number: 03/15/2019, 12:41 PM  Living arrangements for the past 2 months: [place]    Prior Living Arrangements/Services Living arrangements for the past 2 months: Single Family Home Lives with:: Self        Activities of Daily Living Home Assistive Devices/Equipment: Eyeglasses, Ostomy supplies ADL Screening (condition at time of admission) Patient's cognitive ability adequate to safely complete daily activities?: Yes Is the patient deaf or have difficulty hearing?: No Does the patient have difficulty seeing, even when wearing glasses/contacts?: No Does the patient have difficulty concentrating, remembering, or making decisions?: No Patient able to express need for assistance with ADLs?: Yes Does the patient have difficulty dressing or bathing?: No Independently performs ADLs?: Yes (appropriate for developmental age) Does the patient have difficulty walking or climbing stairs?: No Weakness of Legs: None Weakness of Arms/Hands: None   Admission diagnosis:  AKI (acute kidney injury) ([place]) [N17.9] Anemia, unspecified type [D64.9] Pelvic abscess in male [facility]) [K65.1] Patient Active Problem List   Diagnosis Date Noted  . Abscess 03/13/2019  . Hyperkalemia 03/13/2019  . ARF (acute renal failure) ([place]) 03/13/2019  . Abnormal liver function 03/13/2019  . UTI (urinary tract infection) 10/18/2018  . Acute blood loss anemia 10/18/2018  . Rectal ulceration   . Symptomatic anemia 10/17/2018  . Rectal bleeding 10/17/2018  . Anemia   . Malnutrition of moderate degree 08/19/2018  . SIRS (systemic inflammatory response syndrome) ([place]) 08/18/2018  . Severe sepsis ([place]) 08/14/2018  . AKI (acute kidney injury) ([place]) 08/14/2018  . Serum total bilirubin elevated 08/14/2018   . Hematochezia 08/14/2018  . Hyponatremia 08/14/2018  . Normal anion gap metabolic acidosis 69/62/9528  . 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 OSA (obstructive sleep apnea) 08/14/2018  . Lower GI bleed 07/07/2018  . HTN (hypertension) 07/07/2018  . HLD (hyperlipidemia) 07/07/2018  . Prostate cancer ([place]) 07/07/2018   PCP:  [facility]:   [place], [place] [phone] [place] [address] Phone: [phone] Fax: [phone]  [place] [address] [place] [address] Phone: [phone] Fax: [place], [place] - [address] [place] [address] Phone: [phone] Fax: [phone]     Social Determinants of Health (SDOH) Interventions    Readmission Risk Interventions Readmission Risk Prevention Plan 03/15/2019  Transportation Screening Complete  Medication Review (RN Care Manager) Complete  PCP or Specialist appointment within 3-5 days of discharge Not Complete  PCP/Specialist Appt Not Complete comments Not ready for dc  HRI or [place] Not Complete  HRI or Home Care Consult Pt Refusal Comments NA  SW Recovery Care/Counseling Consult Not Complete  SW Consult Not Complete Comments NA  Palliative Care Screening Not Complete  Comments NA  Skilled Nursing Facility Not Complete  SNF Comments NA  Some recent data might be hidden

## 2019-03-16 LAB — CBC
HCT: 28.1 % — ABNORMAL LOW (ref 39.0–52.0)
Hemoglobin: 8.2 g/dL — ABNORMAL LOW (ref 13.0–17.0)
MCH: 24.3 pg — ABNORMAL LOW (ref 26.0–34.0)
MCHC: 29.2 g/dL — ABNORMAL LOW (ref 30.0–36.0)
MCV: 83.4 fL (ref 80.0–100.0)
Platelets: 505 10*3/uL — ABNORMAL HIGH (ref 150–400)
RBC: 3.37 MIL/uL — ABNORMAL LOW (ref 4.22–5.81)
RDW: 18.2 % — ABNORMAL HIGH (ref 11.5–15.5)
WBC: 16.1 10*3/uL — ABNORMAL HIGH (ref 4.0–10.5)
nRBC: 0 % (ref 0.0–0.2)

## 2019-03-16 LAB — AEROBIC CULTURE W GRAM STAIN (SUPERFICIAL SPECIMEN)

## 2019-03-16 LAB — CREATININE, SERUM
Creatinine, Ser: 1.46 mg/dL — ABNORMAL HIGH (ref 0.61–1.24)
GFR calc Af Amer: >60 mL/min (ref >60)
GFR calc non Af Amer: 52 mL/min — ABNORMAL LOW (ref >60)

## 2019-03-16 MED ORDER — VANCOMYCIN HCL 10 G IV SOLR
1250.0000 mg | INTRAVENOUS | Status: DC
  Administered 2019-03-16: 1250 mg via INTRAVENOUS
  Filled 2019-03-16: qty 1250

## 2019-03-16 MED ORDER — TRAMADOL HCL 50 MG PO TABS
50.0000 mg | ORAL_TABLET | Freq: Two times a day (BID) | ORAL | Status: DC | PRN
  Administered 2019-03-17: 50 mg via ORAL
  Filled 2019-03-16: qty 1

## 2019-03-16 NOTE — Progress Notes (Signed)
Pharmacy Antibiotic Note  Phillip Blackwell is a 58 y.o. male admitted on 03/13/2019 with Periprostatic abscess ? Osteomyelitis.  Pharmacy has been consulted for Vancomycin, cefepime dosing, per H&P.  Plan: - Vanc 1500 mg total x1, then 1250mg  q24,  AUC 460, SCr 1.46, TBW (tbw < ibw) - Cefepime 2gm q12hr (no change, Cl still < 60 ml/min) - SCr daily to 8/6  Height: 6' (182.9 cm) Weight: 161 lb 13.1 oz (73.4 kg) IBW/kg (Calculated) : 77.6  Temp (24hrs), Avg:98.7 F (37.1 C), Min:98.4 F (36.9 C), Max:99.3 F (37.4 C)  Recent Labs  Lab 03/13/19 1643 03/14/19 0723 03/14/19 2321 03/15/19 0213 03/16/19 0432  WBC 16.9* 12.3* 12.8* 14.1* 16.1*  CREATININE 2.70* 2.30*  --  1.67* 1.46*  LATICACIDVEN 2.5*  --  0.8  --   --     Estimated Creatinine Clearance: 56.6 mL/min (A) (by C-G formula based on SCr of 1.46 mg/dL (H)).    No Known Allergies  Antimicrobials this admission: Vancomycin 03/13/2019 >> Cefepime 03/13/2019 >>   Dose adjustments this admission: 8/5 Vanc 1g q24, AUC 465, SCr 2 >> 1250mg  q24  Microbiology results: 8/2 BCx x2: ngtd 8/2 scrotum wound: rare GNR/GPC, mod GPR  Thank you for allowing pharmacy to be a part of this patient's care.  Minda Ditto 03/16/2019 11:46 AM

## 2019-03-16 NOTE — Progress Notes (Signed)
PROGRESS NOTE  Phillip Blackwell MHD:[id number] DOB: Aug 19, 1960 DOA: 03/13/2019 PCP: [facility]  HPI/Recap of past 24 hours: Phillip Blackwell a57 y.o.male,w hypertension, hyperlipidemia, h/o CVA, OSA, prostate cancer s/p XRT apparently presents with a new draining fistula through the anterior scrotum. He underwent CT of the abdomen and pelvis which revealed a pelvic fluid collection with some air tracking along the anterior bladder and around the pubic bone. He was admitted to [facility] for priprostatic abscess.   03/16/19: Patient was seen and examined at his bedside.  Reports suprapubic tenderness 8 out of 10 prior to his pain medication.  Denies any cardiopulmonary symptoms.  Assessment/Plan: Principal Problem:   Abscess Active Problems:   Anemia   Hyperkalemia   ARF (acute renal failure) (HCC)   Abnormal liver function   Urinary fistula/rectourethral fistula/periprostatic abscess Per urology, patient is leaking urine through 3 different fistula along the urethra and through a large rectourethral fistula, possibly explaining the fluid tracking in the area on the CT. Last seen by urology on 03/13/2019 Currently on cefepime and IV vancomycin Optimize pain control  AKI on CKD 2 likely prerenal Appears to be back to his baseline creatinine 1.4 with GFR greater than 60 Presented with creatinine of 2.30 with GFR of 35 Continue to avoid nephrotoxins Monitor urine output  Acute hypovolemic hyponatremia Asymptomatic Last sodium 133 Repeat levels in the morning  Chronic normocytic anemia/anemia of chronic disease Presented with hemoglobin of 6.2 No sign of overt bleeding, no significant iron deficiency Post 2 unit PRBCs on 03/14/2019 Hemoglobin is currently stable 8.2 on 03/16/2019  Thrombocythemia Elevated platelet count could be reactive Continue to monitor  Prostate cancer post radiation Follows with oncology at [facility] hypertension, controlled Continue  home antihypertensives  Chronic anxiety/depression Continue Cymbalta, Klonopin, gabapentin  GERD Continue PPI  Hyperlipidemia Continue statin  History of CVA PT OT to assess Fall precaution   DVT prophylaxis: SCDs Code Status: Full code Family Communication: None at bedside Disposition Plan:  Possible discharge to home in 1 to 2 days or when urology signs off.  Consultants:   Surgery  Urology  Procedures: CT of the abdomen and pelvis  antimicrobials: IV vancomycin and IV cefepime since 03/13/2019    Objective: Vitals:   03/15/19 1600 03/15/19 2151 03/16/19 0500 03/16/19 0501  BP:  (!) 151/80  (!) 148/78  Pulse:  79  81  Resp:  20  16  Temp: 98.4 F (36.9 C) 99.3 F (37.4 C)  98.6 F (37 C)  TempSrc: Oral Oral  Oral  SpO2:  100%  99%  Weight:   73.4 kg   Height:        Intake/Output Summary (Last 24 hours) at 03/16/2019 2119 Last data filed at 03/16/2019 0600 Gross per 24 hour  Intake 399.64 ml  Output -  Net 399.64 ml   Filed Weights   03/14/19 0420 03/15/19 0500 03/16/19 0500  Weight: 74.7 kg 74 kg 73.4 kg    Exam:  . General: 58 y.o. year-old male well developed well nourished in no acute distress.  Alert and oriented x3. . Cardiovascular: Regular rate and rhythm with no rubs or gallops.  No thyromegaly or JVD noted.   Marland Kitchen Respiratory: Clear to auscultation with no wheezes or rales. Good inspiratory effort. . Abdomen: Soft nontender nondistended with normal bowel sounds x4 quadrants.  Suprapubic region mild tenderness with palpation.  Fistula noted at base and under penis. . Musculoskeletal: Trace lower extremity edema. 2/4 pulses in all  4 extremities. Marland Kitchen Psychiatry: Mood is appropriate for condition and setting   Data Reviewed: CBC: Recent Labs  Lab 03/13/19 1643 03/14/19 0723 03/14/19 0943 03/14/19 2321 03/15/19 0213 03/16/19 0432  WBC 16.9* 12.3*  --  12.8* 14.1* 16.1*  NEUTROABS 14.3*  --   --  10.0*  --   --   HGB 7.4* 6.2*  --  7.5*  7.8* 8.2*  HCT 27.2* 21.8* 20.8* 26.0* 26.8* 28.1*  MCV 85.3 80.4  --  83.6 82.7 83.4  PLT 541* 455*  --  462* 461* 876*   Basic Metabolic Panel: Recent Labs  Lab 03/13/19 1643 03/14/19 0723 03/15/19 0213 03/16/19 0432  NA 135 138 133*  --   K 5.7* 5.1 4.4  --   CL 105 109 106  --   CO2 15* 20* 19*  --   GLUCOSE 90 106* 100*  --   BUN 51* 43* 28*  --   CREATININE 2.70* 2.30* 1.67* 1.46*  CALCIUM 9.5 9.0 8.9  --    GFR: Estimated Creatinine Clearance: 56.6 mL/min (A) (by C-G formula based on SCr of 1.46 mg/dL (H)). Liver Function Tests: Recent Labs  Lab 03/13/19 1643 03/14/19 0723  AST 50* 44*  ALT 64* 56*  ALKPHOS 124 99  BILITOT 0.7 0.6  PROT 9.3* 7.9  ALBUMIN 3.1* 2.5*   No results for input(s): LIPASE, AMYLASE in the last 168 hours. No results for input(s): AMMONIA in the last 168 hours. Coagulation Profile: No results for input(s): INR, PROTIME in the last 168 hours. Cardiac Enzymes: No results for input(s): CKTOTAL, CKMB, CKMBINDEX, TROPONINI in the last 168 hours. BNP (last 3 results) No results for input(s): PROBNP in the last 8760 hours. HbA1C: No results for input(s): HGBA1C in the last 72 hours. CBG: No results for input(s): GLUCAP in the last 168 hours. Lipid Profile: No results for input(s): CHOL, HDL, LDLCALC, TRIG, CHOLHDL, LDLDIRECT in the last 72 hours. Thyroid Function Tests: Recent Labs    03/14/19 0723  TSH 4.138   Anemia Panel: Recent Labs    03/14/19 0943  VITAMINB12 414  FERRITIN 177  TIBC 306  IRON 84   Urine analysis:    Component Value Date/Time   COLORURINE YELLOW 10/17/2018 2154   APPEARANCEUR CLOUDY (A) 10/17/2018 2154   LABSPEC 1.014 10/17/2018 2154   PHURINE 5.0 10/17/2018 2154   GLUCOSEU 50 (A) 10/17/2018 2154   HGBUR LARGE (A) 10/17/2018 2154   BILIRUBINUR NEGATIVE 10/17/2018 2154   BILIRUBINUR negative 09/09/2018 [facility] 10/17/2018 2154   PROTEINUR 30 (A) 10/17/2018 2154   UROBILINOGEN 0.2  09/09/2018 1136   NITRITE NEGATIVE 10/17/2018 2154   LEUKOCYTESUR LARGE (A) 10/17/2018 2154   Sepsis Labs: @LABRCNTIP (procalcitonin:4,lacticidven:4)  ) Recent Results (from the past 240 hour(s))  Culture, blood (Routine X 2) w Reflex to ID Panel     Status: None (Preliminary result)   Collection Time: 03/13/19  4:43 PM   Specimen: BLOOD RIGHT FOREARM  Result Value Ref Range Status   Specimen Description   Final    BLOOD RIGHT FOREARM Performed at [facility], [place] [address] [place], [place] [address]    Special Requests   Final    BOTTLES DRAWN AEROBIC AND ANAEROBIC Blood Culture results may not be optimal due to an inadequate volume of blood received in culture bottles Performed at [place] [facility] [address] [place], [place] [address]    Culture   Final  NO GROWTH 2 DAYS Performed at [facility], [place] [address], [place], [place] [address]    Report Status PENDING  Incomplete  Culture, blood (Routine X 2) w Reflex to ID Panel     Status: None (Preliminary result)   Collection Time: 03/13/19  4:43 PM   Specimen: BLOOD RIGHT HAND  Result Value Ref Range Status   Specimen Description   Final    BLOOD RIGHT HAND Performed at [facility], [place] [address] [place], [place] [address]    Special Requests   Final    BOTTLES DRAWN AEROBIC ONLY Blood Culture results may not be optimal due to an inadequate volume of blood received in culture bottles Performed at [facility] [address], [place], [place] [address]    Culture   Final    NO GROWTH 2 DAYS Performed at [place] [address], [place], [place] [address]    Report Status PENDING  Incomplete  SARS Coronavirus 2 [facility] order, Performed in [facility] hospital lab) Nasopharyngeal Nasopharyngeal Swab     Status: None   Collection Time: 03/13/19  4:43 PM   Specimen: Nasopharyngeal Swab  Result Value Ref Range Status   SARS Coronavirus 2  NEGATIVE NEGATIVE Final    Comment: (NOTE) If result is NEGATIVE SARS-CoV-2 target nucleic acids are NOT DETECTED. The SARS-CoV-2 RNA is generally detectable in upper and lower  respiratory specimens during the acute phase of infection. The lowest  concentration of SARS-CoV-2 viral copies this assay can detect is 250  copies / mL. A negative result does not preclude SARS-CoV-2 infection  and should not be used as the sole basis for treatment or other  patient management decisions.  A negative result may occur with  improper specimen collection / handling, submission of specimen other  than nasopharyngeal swab, presence of viral mutation(s) within the  areas targeted by this assay, and inadequate number of viral copies  (<250 copies / mL). A negative result must be combined with clinical  observations, patient history, and epidemiological information. If result is POSITIVE SARS-CoV-2 target nucleic acids are DETECTED. The SARS-CoV-2 RNA is generally detectable in upper and lower  respiratory specimens dur ing the acute phase of infection.  Positive  results are indicative of active infection with SARS-CoV-2.  Clinical  correlation with patient history and other diagnostic information is  necessary to determine patient infection status.  Positive results do  not rule out bacterial infection or co-infection with other viruses. If result is PRESUMPTIVE POSTIVE SARS-CoV-2 nucleic acids MAY BE PRESENT.   A presumptive positive result was obtained on the submitted specimen  and confirmed on repeat testing.  While 2019 novel coronavirus  (SARS-CoV-2) nucleic acids may be present in the submitted sample  additional confirmatory testing may be necessary for epidemiological  and / or clinical management purposes  to differentiate between  SARS-CoV-2 and other Sarbecovirus currently known to infect humans.  If clinically indicated additional testing with an alternate test  methodology [phone])  is advised. The SARS-CoV-2 RNA is generally  detectable in upper and lower respiratory sp ecimens during the acute  phase of infection. The expected result is Negative. Fact Sheet for Patients:  StrictlyIdeas.no Fact Sheet for Healthcare Providers: BankingDealers.co.za This test is not yet approved or cleared by the [place] FDA and has been authorized for detection and/or diagnosis of SARS-CoV-2 by FDA under an Emergency Use Authorization (EUA).  This EUA will remain in effect (  meaning this test can be used) for the duration of the COVID-19 declaration under Section 564(b)(1) of the Act, 21 U.S.C. section 360bbb-3(b)(1), unless the authorization is terminated or revoked sooner. Performed at [facility], [place] [address], [place], [place] [address]   Wound or Superficial Culture     Status: None (Preliminary result)   Collection Time: 03/13/19  4:43 PM   Specimen: Scrotum; Wound  Result Value Ref Range Status   Specimen Description   Final    SCROTUM Performed at [facility] [address] [place], [place] [address]    Special Requests   Final    NONE Performed at [facility], [place] [address] [place], [place] [address]    Gram Stain   Final    FEW WBC PRESENT, PREDOMINANTLY PMN MODERATE GRAM POSITIVE RODS RARE GRAM NEGATIVE RODS RARE GRAM POSITIVE COCCI IN CLUSTERS    Culture   Final    CULTURE REINCUBATED FOR BETTER GROWTH Performed at [facility], [place] [address], [place], [place] [address]    Report Status PENDING  Incomplete      Studies: No results found.  Scheduled Meds: . atorvastatin  40 mg Oral Q supper  . chlorhexidine  15 mL Mouth Rinse BID  . Chlorhexidine Gluconate Cloth  6 each Topical Daily  . chlorproMAZINE  25 mg Oral QHS  . cholecalciferol  2,000 Units Oral Daily  . clonazePAM  1 mg Oral QHS  . DULoxetine  60 mg Oral Q supper  .  ferrous sulfate  325 mg Oral 3 times weekly  . gabapentin  300 mg Oral BID  . mouth rinse  15 mL Mouth Rinse q12n4p  . metoprolol tartrate  100 mg Oral Daily  . multivitamin with minerals  1 tablet Oral Daily  . pantoprazole  40 mg Oral Daily  . polycarbophil  625 mg Oral 3 times weekly  . prazosin  1 mg Oral QHS  . traMADol  50 mg Oral TID    Continuous Infusions: . ceFEPime (MAXIPIME) IV Stopped (03/16/19 0015)  . vancomycin Stopped (03/15/19 2315)     LOS: 3 days     Kayleen Memos, MD [facility] Pager [phone]  If 7PM-7AM, please contact night-coverage [email or web address] Password [email or web address] 03/16/2019, 9:18 AM

## 2019-03-16 NOTE — Plan of Care (Signed)

## 2019-03-17 LAB — CBC
HCT: 30.7 % — ABNORMAL LOW (ref 39.0–52.0)
Hemoglobin: 8.8 g/dL — ABNORMAL LOW (ref 13.0–17.0)
MCH: 24.1 pg — ABNORMAL LOW (ref 26.0–34.0)
MCHC: 28.7 g/dL — ABNORMAL LOW (ref 30.0–36.0)
MCV: 84.1 fL (ref 80.0–100.0)
Platelets: 527 10*3/uL — ABNORMAL HIGH (ref 150–400)
RBC: 3.65 MIL/uL — ABNORMAL LOW (ref 4.22–5.81)
RDW: 18.8 % — ABNORMAL HIGH (ref 11.5–15.5)
WBC: 16.7 10*3/uL — ABNORMAL HIGH (ref 4.0–10.5)
nRBC: 0 % (ref 0.0–0.2)

## 2019-03-17 LAB — PROCALCITONIN: Procalcitonin: 0.17 ng/mL

## 2019-03-17 LAB — CREATININE, SERUM
Creatinine, Ser: 1.43 mg/dL — ABNORMAL HIGH (ref 0.61–1.24)
GFR calc Af Amer: >60 mL/min (ref >60)
GFR calc non Af Amer: 53 mL/min — ABNORMAL LOW (ref >60)

## 2019-03-17 MED ORDER — OXYCODONE HCL 5 MG PO TABS
5.0000 mg | ORAL_TABLET | Freq: Four times a day (QID) | ORAL | Status: DC | PRN
  Administered 2019-03-17: 5 mg via ORAL
  Filled 2019-03-17: qty 1

## 2019-03-17 MED ORDER — OXYCODONE HCL 5 MG PO TABS: 5.0000 mg | ORAL_TABLET | Freq: Every day | ORAL | 0 refills | Status: DC | PRN

## 2019-03-17 MED ORDER — TRAMADOL HCL 50 MG PO TABS: 50.0000 mg | ORAL_TABLET | Freq: Two times a day (BID) | ORAL | 0 refills | Status: DC | PRN

## 2019-03-17 NOTE — Progress Notes (Signed)
OT Cancellation Note  Patient Details Name: Phillip Blackwell MRN: [id number] DOB: [birth year]   Cancelled Treatment:    Reason Eval/Treat Not Completed: OT screened, no needs identified, will sign off. Checked with pt/wife.  No needs.  Phillip Blackwell 03/17/2019, 1:25 PM  Lesle Chris, OTR/L Acute Rehabilitation Services [phone] [facility] pager [phone] office 03/17/2019

## 2019-03-17 NOTE — Evaluation (Signed)
Physical Therapy One Time Evaluation Patient Details Name: Phillip Blackwell MRN: [id number] DOB: Jan 07, 1961 Today's Date: 03/17/2019   History of Present Illness  Pt is a 58 year old male admitted for Urinary fistula/rectourethral fistula/periprostatic abscess.  PMHx partial colectomy with colostomy, prostate cancer with radiation, I&D of perineum, CVA  Clinical Impression  Patient evaluated by Physical Therapy with no further acute PT needs identified. All education has been completed and the patient has no further questions.  Pt ambulated in hallway using RW.  Pt reliant on UEs for pain control and agreeable to use RW upon d/c.  Pt eager for d/c home today.  See below for any follow-up Physical Therapy or equipment needs. PT is signing off. Thank you for this referral.     Follow Up Recommendations No PT follow up    Equipment Recommendations  None recommended by PT    Recommendations for Other Services       Precautions / Restrictions Precautions Precautions: None      Mobility  Bed Mobility Overal bed mobility: Modified Independent                Transfers Overall transfer level: Needs assistance Equipment used: Rolling walker (2 wheeled) Transfers: Sit to/from Stand Sit to Stand: Supervision         General transfer comment: use of UEs for self assist; no physical assist required  Ambulation/Gait Ambulation/Gait assistance: Min guard;Supervision Gait Distance (Feet): 160 Feet Assistive device: Rolling walker (2 wheeled) Gait Pattern/deviations: Step-through pattern;Decreased stride length;Wide base of support Gait velocity: decr   General Gait Details: antalgic gait and increased use of UEs through RW however steady, distance to tolerance  Stairs            Wheelchair Mobility    Modified Rankin (Stroke Patients Only)       Balance Overall balance assessment: No apparent balance deficits (not formally assessed)(denies any recent falls)                                            Pertinent Vitals/Pain Pain Assessment: Faces Faces Pain Scale: Hurts little more Pain Location: bil groin Pain Descriptors / Indicators: Sore;Aching Pain Intervention(s): Repositioned;Monitored during session    Home Living Family/patient expects to be discharged to:: Private residence Living Arrangements: Spouse/significant other Available Help at Discharge: Family;Available PRN/intermittently Type of Home: House Home Access: Stairs to enter Entrance Stairs-Rails: None Entrance Stairs-Number of Steps: 4 Home Layout: One level Home Equipment: Walker - 2 wheels;Cane - single point;Crutches      Prior Function Level of Independence: Independent               Hand Dominance        Extremity/Trunk Assessment        Lower Extremity Assessment Lower Extremity Assessment: Overall WFL for tasks assessed       Communication   Communication: No difficulties  Cognition Arousal/Alertness: Awake/alert Behavior During Therapy: Flat affect Overall Cognitive Status: Within Functional Limits for tasks assessed                                        General Comments      Exercises     Assessment/Plan    PT Assessment Patent does not need any further PT services  PT Problem List         PT Treatment Interventions      PT Goals (Current goals can be found in the Care Plan section)  Acute Rehab PT Goals PT Goal Formulation: All assessment and education complete, DC therapy    Frequency     Barriers to discharge        Co-evaluation               AM-PAC PT "6 Clicks" Mobility  Outcome Measure Help needed turning from your back to your side while in a flat bed without using bedrails?: None Help needed moving from lying on your back to sitting on the side of a flat bed without using bedrails?: None Help needed moving to and from a bed to a chair (including a wheelchair)?: A  Little Help needed standing up from a chair using your arms (e.g., wheelchair or bedside chair)?: A Little Help needed to walk in hospital room?: A Little Help needed climbing 3-5 steps with a railing? : A Little 6 Click Score: 20    End of Session Equipment Utilized During Treatment: Gait belt Activity Tolerance: Patient tolerated treatment well Patient left: in chair;with call bell/phone within reach Nurse Communication: Mobility status PT Visit Diagnosis: Other abnormalities of gait and mobility (R26.89)    Time: 1610-9604 PT Time Calculation (min) (ACUTE ONLY): 13 min   Charges:   PT Evaluation $PT Eval Low Complexity: [place], PT, DPT Acute Rehabilitation Services Office: [phone] Pager: [phone]  Trena Platt 03/17/2019, 12:23 PM

## 2019-03-17 NOTE — Progress Notes (Signed)
RN reviewed discharge paperwork with patient and wife.  Both stated understanding.  RN removed intact IV. All vitals stable. Patient in possession of all personal belongings. NT wheeled patient to front entrance where wife was waiting in her vehicle for patient.

## 2019-03-17 NOTE — Discharge Summary (Signed)
Discharge Summary  Phillip Blackwell NOB:[id number] DOB: [birth year]  PCP: [facility]  Admit date: 03/13/2019 Discharge date: 03/17/2019  Time spent: 35 minutes  Recommendations for Outpatient Follow-up:  1. Follow-up with urology, call within a week. 2. You have an appointment at Phillip Blackwell office on 04/14/2019 for lab work and on 04/21/2019 at 9 AM for a visit.  Please call within 1 week to let his office know you are out of the hospital. 3. Follow-up with your primary care provider 4. Follow-up with your hematologist/oncologist 5. Take your medications as prescribed.  Discharge Diagnoses:  Active Hospital Problems   Diagnosis Date Noted   Abscess 03/13/2019   Hyperkalemia 03/13/2019   ARF (acute renal failure) ([facility]) 03/13/2019   Abnormal liver function 03/13/2019   Anemia     Resolved Hospital Problems  No resolved problems to display.    Discharge Condition: Stable  Diet recommendation: Resume previous diet  Vitals:   03/16/19 2050 03/17/19 0527  BP: 140/86 135/81  Pulse: 77 77  Resp: 18 16  Temp: 98.4 F (36.9 C) 98.5 F (36.9 C)  SpO2: 100% 99%    History of present illness:   Phillip Blackwell a57 y.o.male,w hypertension, hyperlipidemia, h/o CVA, OSA, prostate cancer s/p XRT apparently presentswith a new draining fistula through the anterior scrotum. He underwent CT of the abdomen and pelvis which revealed a pelvic fluid collection with some air tracking along the anterior bladder and around the pubic bone. He was admitted to [facility] for urinary fistula, rectourethral fistula, AKI with elevated lactic acid.  Urology and general surgery consulted and followed.  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  Per urology, Dr. Junious Blackwell, patient is leaking urine through 3 different fistula along the urethra and through a large rectourethral fistula -I think this explains the fluid tracking in the air on the CT.  We  talked about the nature risk and benefits of a suprapubic tube but he is not sure if he wants to have another bag versus just continuing to leak urine through the fistula and evacuating urine per rectum.    Completed 4 days of IV cefepime and IV vancomycin.  Wound culture: Multiple organisms present, none predominant.  Blood cultures x2 no growth in 3 days.  Vital signs stable.  03/17/19: Patient was seen and examined at his bedside.  No acute events overnight.  Vital signs and labs reviewed and are stable.  Discussed with Dr. Junious Blackwell via phone on 03/17/2019, recommends patient to follow-up with Phillip Blackwell outpatient.  No need for additional antibiotics since cultures are negative.  Patient advised to keep his follow-up appointment and to call Phillip Blackwell office within a week post discharge.  Patient understands and agrees to plan.  On the day of discharge, the patient was hemodynamically stable.  He will need to follow-up with urology and his primary care provider posthospitalization.  Hospital Course:  Principal Problem:   Abscess Active Problems:   Anemia   Hyperkalemia   ARF (acute renal failure) ([facility])   Abnormal liver function  Chronic urinary fistula/rectourethral fistula/ Per urology, patient is leaking urine through 3 different fistula along the urethra and through a large rectourethral fistula, possibly explaining the fluid tracking in the area on the CT. Seen by urology in consultation on 03/13/2019 Completed 4 days of IV cefepime and IV vancomycin. Wound culture and blood cultures unremarkable. Discussed with urology, no need to continue antibiotics per urology. 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 Patient advised to stay hydrated to preserve his kidney  function.  Patient understands and agrees to plan. Follow-up with your primary care provider  Acute mild hypovolemic hyponatremia Asymptomatic Last sodium 133 Follow-up with your PCP  Chronic normocytic anemia/anemia of chronic disease Presented with hemoglobin of 6.2 No sign of overt bleeding, no significant iron deficiency Post 2 unit PRBCs on 03/14/2019 Hemoglobin is currently stable 8.8 on 03/17/2019.  Thrombocythemia Elevated platelet count could be reactive Platelet count 527K on 01/15/2019 Follow-up with your hematologist/oncologist  Prostate cancer post radiation Follows with oncology at [place] hypertension, controlled Continue home antihypertensives  Chronic anxiety/depression Continue Cymbalta, Klonopin, gabapentin  GERD Continue PPI  Hyperlipidemia Continue statin  History of CVA PT OT assessed with no further recommendations.  Moderate protein calorie malnutrition Albumin 2.5 BMI 23 Patient advised to continue to drink protein shake as tolerated Patient understands and agrees to plan.   Code Status:Full code   Consultants:  Surgery  Urology  Procedures:CT of the abdomen and pelvis   Antimicrobials: IV vancomycin and IV cefepime since 03/13/2019>> DC 03/17/19.   Discharge Exam: BP 135/81 (BP Location: Right Arm)    Pulse 77    Temp 98.5 F (36.9 C) (Oral)    Resp 16    Ht 6' (1.829 m)    Wt 77.1 kg    SpO2 99%    BMI 23.05 kg/m   General: 58 y.o. year-old male well developed well nourished in no acute distress.  Alert and oriented x3.  Cardiovascular: Regular rate and rhythm with no rubs or gallops.  No thyromegaly or JVD noted.    Respiratory: Clear to auscultation with no wheezes or rales. Good inspiratory effort.  Abdomen: Soft nontender nondistended with normal bowel sounds x4 quadrants.  Fistula noted at base of penis and anterior scrotum.  No purulence.  Musculoskeletal: No lower extremity edema. 2/4 pulses  in all 4 extremities.  Psychiatry: Mood is appropriate for condition and setting  Discharge Instructions You were cared for by a hospitalist during your hospital stay. If you have any questions about your discharge medications or the care you received while you were in the hospital after you are discharged, you can call the unit and asked to speak with the hospitalist on call if the hospitalist that took care of you is not available. Once you are discharged, your primary care physician will handle any further medical issues. Please note that NO REFILLS for any discharge medications will be authorized once you are discharged, as it is imperative that you return to your primary care physician (or establish a relationship with a primary care physician if you do not have one) for your aftercare needs so that they can reassess your need for medications and monitor your lab values.   Allergies as of 03/17/2019   No Known Allergies     Medication List    STOP taking these medications   amLODipine 10 MG tablet Commonly known as: NORVASC   sulfamethoxazole-trimethoprim 800-160 MG tablet Commonly known as: BACTRIM DS     TAKE these medications   atorvastatin 40 MG tablet Commonly known as: LIPITOR Take 40 mg by mouth daily with supper.   chlorproMAZINE 25 MG tablet Commonly known as: THORAZINE Take 25 mg  by mouth at bedtime.   clonazePAM 1 MG tablet Commonly known as: KLONOPIN Take 1 mg by mouth at bedtime.   Dressing Sponges 4"X4" Pads Provide 1 month supply for daily wet and dry dressing - along with sterile Saline bottle.   DULoxetine 60 MG capsule Commonly known as: CYMBALTA Take 60 mg by mouth daily with supper.   ferrous sulfate 325 (65 FE) MG tablet Take 1 tablet (325 mg total) by mouth daily with breakfast. What changed: when to take this   FiberCon 625 MG tablet Generic drug: polycarbophil Take 625 mg by mouth 3 (three) times a week.   gabapentin 300 MG  capsule Commonly known as: NEURONTIN Take 300 mg by mouth 2 (two) times a day.   LACTOBACILLUS PO Take 1 capsule by mouth daily.   metoprolol tartrate 100 MG tablet Commonly known as: LOPRESSOR Take 100 mg by mouth daily.   multivitamin with minerals tablet Take 1 tablet by mouth daily.   omeprazole 40 MG capsule Commonly known as: PRILOSEC Take 40 mg by mouth daily as needed (indigestion.).   oxyCODONE 5 MG immediate release tablet Commonly known as: Oxy IR/ROXICODONE Take 1 tablet (5 mg total) by mouth daily as needed for severe pain or breakthrough pain (try Tramadol 1st). What changed:   when to take this  reasons to take this   prazosin 1 MG capsule Commonly known as: MINIPRESS Take 1 mg by mouth at bedtime.   STOOL SOFTENER LAXATIVE PO Take 1 tablet by mouth 2 (two) times a week.   traMADol 50 MG tablet Commonly known as: ULTRAM Take 1 tablet (50 mg total) by mouth every 12 (twelve) hours as needed for moderate pain. What changed:   when to take this  reasons to take this   Vitamin D3 50 MCG (2000 UT) Tabs Take 2,000 Units by mouth daily.      No Known Allergies Follow-up [place]. Call in 1 day(s).   Specialty: General Practice Why: Please call for a post hospital follow-up appointment. Contact information: [place] [address] [phone]        Alexis Frock, MD. Call in 1 day(s).   Specialty: Urology Why: Please call for a post hospital follow-up appointment. Contact information: [place] [address] [phone]            The results of significant diagnostics from this hospitalization (including imaging, microbiology, ancillary and laboratory) are listed below for reference.    Significant Diagnostic Studies: Ct Abdomen Pelvis Wo Contrast  Result Date: 03/13/2019 CLINICAL DATA:  Evaluate for abdominal infection. EXAM: CT ABDOMEN AND PELVIS WITHOUT CONTRAST  TECHNIQUE: Multidetector CT imaging of the abdomen and pelvis was performed following the standard protocol without IV contrast. COMPARISON:  08/13/2018 FINDINGS: Lower chest: No acute abnormality. Hepatobiliary: No focal liver abnormality is seen. No gallstones, gallbladder wall thickening, or biliary dilatation. Pancreas: Unremarkable. No pancreatic ductal dilatation or surrounding inflammatory changes. Spleen: Normal in size without focal abnormality. Adrenals/Urinary Tract: Normal appearance of the adrenal glands. The kidneys are normal. No mass or hydronephrosis. Diffuse bladder wall thickening is identified, similar to the previous exam. Mild pericystic fat stranding. Stomach/Bowel: Stomach is normal. No bowel wall thickening, distension or inflammation. Left lower quadrant colostomy. Vascular/Lymphatic: Aortic atherosclerosis. No aneurysm. No enlarged lymph nodes within the abdomen. Prominent left pelvic sidewall lymph node measures 1 cm, image 75/2. Previously 0.8 cm. Reproductive: Deep pelvic fluid collection involving the prostate gland and  surrounding soft tissues . This measures 4.4 by 5.9 by 3.7 cm, image 86/2 and image 78/4. The fluid collection and inflammatory changes extend ventrally to involve the pubic symphysis. Although limited due to lack of IV contrast material there appears to be direct communication of this fluid collection with the floor of the urinary bladder, image 64/5. Further, ventral extension to involve the lower rectus musculature is identified and pubic symphysis noted. Gas is now seen within the pubic symphysis with evidence of cortical erosion compatible with osteomyelitis. Intramuscular gas is now seen within the rectus musculature just above the pubic symphysis reflecting myositis, necrotizing myositis not excluded. Inflammatory changes continue ventrally with there is soft tissue gas and thickening just above the base of penis is identified, image 79/5. Other: None  Musculoskeletal: Slight widening with cortical erosions along the articular surface of the pubic symphysis is identified consistent with infectious/inflammatory arthropathy, image 85/2. IMPRESSION: 1. Exam detail is diminished secondary to lack of IV contrast material. Prostatic and periprostatic fluid collection is identified within the floor of pelvis. Foci of gas are identified within this fluid collection compatible with abscess abscess. This fluid collection appears to directly communicate within the anterior floor of the urinary bladder. There is also ventral extension of infection to involve the pubic symphysis where there is evidence of inflammatory arthropathy and possible osteomyelitis. Continued ventral extension of inflammation involves the rectus musculature where intramuscular gas is noted, cannot exclude necrotizing myositis. 2. Bladder wall thickening and surrounding fat stranding compatible with cystitis. Electronically Signed   By: Kerby Moors M.D.   On: 03/13/2019 18:01   Dg Chest Port 1 View  Result Date: 03/13/2019 CLINICAL DATA:  Short of breath. EXAM: PORTABLE CHEST 1 VIEW COMPARISON:  10/17/2018 FINDINGS: The heart size and mediastinal contours are within normal limits. Both lungs are clear. The visualized skeletal structures are unremarkable. IMPRESSION: No active disease. Electronically Signed   By: Kerby Moors M.D.   On: 03/13/2019 17:30    Microbiology: Recent Results (from the past 240 hour(s))  Culture, blood (Routine X 2) w Reflex to ID Panel     Status: None (Preliminary result)   Collection Time: 03/13/19  4:43 PM   Specimen: BLOOD RIGHT FOREARM  Result Value Ref Range Status   Specimen Description   Final    BLOOD RIGHT FOREARM Performed at [facility], [place] [address], [place], [place] [address]    Special Requests   Final    BOTTLES DRAWN AEROBIC AND ANAEROBIC Blood Culture results may not be optimal due to an inadequate volume of blood received in  culture bottles Performed at [facility] [address], [place], [place] [address]    Culture   Final    NO GROWTH 3 DAYS Performed at [facility], [place] [address] [place], [place] [address]    Report Status PENDING  Incomplete  Culture, blood (Routine X 2) w Reflex to ID Panel     Status: None (Preliminary result)   Collection Time: 03/13/19  4:43 PM   Specimen: BLOOD RIGHT HAND  Result Value Ref Range Status   Specimen Description   Final    BLOOD RIGHT HAND Performed at [facility], [place] [address], [place], [place] [address]    Special Requests   Final    BOTTLES DRAWN AEROBIC ONLY Blood Culture results may not be optimal due to an inadequate volume of blood received in culture bottles Performed at [facility], [place] [address], [place], [place] [address]    Culture   Final    NO GROWTH 3 DAYS Performed at [facility], [place] [address], [place], [place] [address]    Report Status PENDING  Incomplete  SARS Coronavirus 2 [facility] order, Performed in [facility] hospital lab) Nasopharyngeal Nasopharyngeal Swab     Status: None   Collection Time: 03/13/19  4:43 PM   Specimen: Nasopharyngeal Swab  Result Value Ref Range Status   SARS Coronavirus 2 NEGATIVE NEGATIVE Final    Comment: (NOTE) If result is NEGATIVE SARS-CoV-2 target nucleic acids are NOT DETECTED. The SARS-CoV-2 RNA is generally detectable in upper and lower  respiratory specimens during the acute phase of infection. The lowest  concentration of SARS-CoV-2 viral copies this assay can detect is 250  copies / mL. A negative result does not preclude SARS-CoV-2 infection  and should not be used as the sole basis for treatment or other  patient management decisions.  A negative result may occur with  improper specimen collection / handling, submission of specimen other  than nasopharyngeal swab, presence of viral mutation(s) within the  areas targeted by this  assay, and inadequate number of viral copies  (<250 copies / mL). A negative result must be combined with clinical  observations, patient history, and epidemiological information. If result is POSITIVE SARS-CoV-2 target nucleic acids are DETECTED. The SARS-CoV-2 RNA is generally detectable in upper and lower  respiratory specimens dur ing the acute phase of infection.  Positive  results are indicative of active infection with SARS-CoV-2.  Clinical  correlation with patient history and other diagnostic information is  necessary to determine patient infection status.  Positive results do  not rule out bacterial infection or co-infection with other viruses. If result is PRESUMPTIVE POSTIVE SARS-CoV-2 nucleic acids MAY BE PRESENT.   A presumptive positive result was obtained on the submitted specimen  and confirmed on repeat testing.  While 2019 novel coronavirus  (SARS-CoV-2) nucleic acids may be present in the submitted sample  additional confirmatory testing may be necessary for epidemiological  and / or clinical management purposes  to differentiate between  SARS-CoV-2 and other Sarbecovirus currently known to infect humans.  If clinically indicated additional testing with an alternate test  methodology [phone]) is advised. The SARS-CoV-2 RNA is generally  detectable in upper and lower respiratory sp ecimens during the acute  phase of infection. The expected result is Negative. Fact Sheet for Patients:  StrictlyIdeas.no Fact Sheet for Healthcare Providers: BankingDealers.co.za This test is not yet approved or cleared by the [place] FDA and has been authorized for detection and/or diagnosis of SARS-CoV-2 by FDA under an Emergency Use Authorization (EUA).  This EUA will remain in effect (meaning this test can be used) for the duration of the COVID-19 declaration under Section 564(b)(1) of the Act, 21 U.S.C. section 360bbb-3(b)(1),  unless the authorization is terminated or revoked sooner. Performed at [facility], [place] [address] [place], [place] [address]   Wound or Superficial Culture     Status: Abnormal   Collection Time: 03/13/19  4:43 PM   Specimen: Scrotum; Wound  Result Value Ref Range Status   Specimen Description   Final    SCROTUM Performed at [facility] [address], [place], [place] [address]    Special Requests   Final    NONE Performed at [facility], [place] [address], [place], [place] [address]    Gram  Stain   Final    FEW WBC PRESENT, PREDOMINANTLY PMN MODERATE GRAM POSITIVE RODS RARE GRAM NEGATIVE RODS RARE GRAM POSITIVE COCCI IN CLUSTERS Performed at [facility], [place] [address], [place], [place] [address]    Culture MULTIPLE ORGANISMS PRESENT, NONE PREDOMINANT (A)  Final   Report Status 03/16/2019 FINAL  Final     Labs: Basic Metabolic Panel: Recent Labs  Lab 03/13/19 1643 03/14/19 0723 03/15/19 0213 03/16/19 0432 03/17/19 0426  NA 135 138 133*  --   --   K 5.7* 5.1 4.4  --   --   CL 105 109 106  --   --   CO2 15* 20* 19*  --   --   GLUCOSE 90 106* 100*  --   --   BUN 51* 43* 28*  --   --   CREATININE 2.70* 2.30* 1.67* 1.46* 1.43*  CALCIUM 9.5 9.0 8.9  --   --    Liver Function Tests: Recent Labs  Lab 03/13/19 1643 03/14/19 0723  AST 50* 44*  ALT 64* 56*  ALKPHOS 124 99  BILITOT 0.7 0.6  PROT 9.3* 7.9  ALBUMIN 3.1* 2.5*   No results for input(s): LIPASE, AMYLASE in the last 168 hours. No results for input(s): AMMONIA in the last 168 hours. CBC: Recent Labs  Lab 03/13/19 1643 03/14/19 0723 03/14/19 0943 03/14/19 2321 03/15/19 0213 03/16/19 0432 03/17/19 0426  WBC 16.9* 12.3*  --  12.8* 14.1* 16.1* 16.7*  NEUTROABS 14.3*  --   --  10.0*  --   --   --   HGB 7.4* 6.2*  --  7.5* 7.8* 8.2* 8.8*  HCT 27.2* 21.8* 20.8* 26.0* 26.8* 28.1* 30.7*  MCV 85.3 80.4  --  83.6 82.7 83.4 84.1  PLT 541*  455*  --  462* 461* 505* 527*   Cardiac Enzymes: No results for input(s): CKTOTAL, CKMB, CKMBINDEX, TROPONINI in the last 168 hours. BNP: BNP (last 3 results) Recent Labs    08/18/18 0359  BNP 219.6*    ProBNP (last 3 results) No results for input(s): PROBNP in the last 8760 hours.  CBG: No results for input(s): GLUCAP in the last 168 hours.     Signed:  Kayleen Memos, MD [facility] 03/17/2019, 12:45 PM

## 2019-03-17 NOTE — Plan of Care (Signed)

## 2019-03-17 NOTE — Discharge Instructions (Signed)
Acute Kidney Injury, Adult ° °Acute kidney injury is a sudden worsening of kidney function. The kidneys are organs that have several jobs. They filter the blood to remove waste products and extra fluid. They also maintain a healthy balance of minerals and hormones in the body, which helps control blood pressure and keep bones strong. With this condition, your kidneys do not do their jobs as well as they should. °This condition ranges from mild to severe. Over time it may develop into long-lasting (chronic) kidney disease. Early detection and treatment may prevent acute kidney injury from developing into a chronic condition. °What are the causes? °Common causes of this condition include: °· A problem with blood flow to the kidneys. This may be caused by: °? Low blood pressure (hypotension) or shock. °? Blood loss. °? Heart and blood vessel (cardiovascular) disease. °? Severe burns. °? Liver disease. °· Direct damage to the kidneys. This may be caused by: °? Certain medicines. °? A kidney infection. °? Poisoning. °? Being around or in contact with toxic substances. °? A surgical wound. °? A hard, direct hit to the kidney area. °· A sudden blockage of urine flow. This may be caused by: °? Cancer. °? Kidney stones. °? An enlarged prostate in males. °What are the signs or symptoms? °Symptoms of this condition may not be obvious until the condition becomes severe. Symptoms of this condition can include: °· Tiredness (lethargy), or difficulty staying awake. °· Nausea or vomiting. °· Swelling (edema) of the face, legs, ankles, or feet. °· Problems with urination, such as: °? Abdominal pain, or pain along the side of your stomach (flank). °? Decreased urine production. °? Decrease in the force of urine flow. °· Muscle twitches and cramps, especially in the legs. °· Confusion or trouble concentrating. °· Loss of appetite. °· Fever. °How is this diagnosed? °This condition may be diagnosed with tests, including: °· Blood  tests. °· Urine tests. °· Imaging tests. °· A test in which a sample of tissue is removed from the kidneys to be examined under a microscope (kidney biopsy). °How is this treated? °Treatment for this condition depends on the cause and how severe the condition is. In mild cases, treatment may not be needed. The kidneys may heal on their own. In more severe cases, treatment will involve: °· Treating the cause of the kidney injury. This may involve changing any medicines you are taking or adjusting your dosage. °· Fluids. You may need specialized IV fluids to balance your body's needs. °· Having a catheter placed to drain urine and prevent blockages. °· Preventing problems from occurring. This may mean avoiding certain medicines or procedures that can cause further injury to the kidneys. °In some cases treatment may also require: °· A procedure to remove toxic wastes from the body (dialysis or continuous renal replacement therapy - CRRT). °· Surgery. This may be done to repair a torn kidney, or to remove the blockage from the urinary system. °Follow these instructions at home: °Medicines °· Take over-the-counter and prescription medicines only as told by your health care provider. °· Do not take any new medicines without your health care provider's approval. Many medicines can worsen your kidney damage. °· Do not take any vitamin and mineral supplements without your health care provider's approval. Many nutritional supplements can worsen your kidney damage. °Lifestyle °· If your health care provider prescribed changes to your diet, follow them. You may need to decrease the amount of protein you eat. °· Achieve and maintain a healthy   weight. If you need help with this, ask your health care provider. °· Start or continue an exercise plan. Try to exercise at least 30 minutes a day, 5 days a week. °· Do not use any tobacco products, such as cigarettes, chewing tobacco, and e-cigarettes. If you need help quitting, ask your  health care provider. °General instructions °· Keep track of your blood pressure. Report changes in your blood pressure as told by your health care provider. °· Stay up to date with immunizations. Ask your health care provider which immunizations you need. °· Keep all follow-up visits as told by your health care provider. This is important. °Where to find more information °· American Association of Kidney Patients: www.aakp.org °· National Kidney Foundation: www.kidney.org °· American Kidney Fund: www.akfinc.org °· [employer] Program: °? www.lifeoptions.org °? www.kidneyschool.org °Contact a health care provider if: °· Your symptoms get worse. °· You develop new symptoms. °Get help right away if: °· You develop symptoms of worsening kidney disease, which include: °? Headaches. °? Abnormally dark or light skin. °? Easy bruising. °? Frequent hiccups. °? Chest pain. °? Shortness of breath. °? End of menstruation in women. °? Seizures. °? Confusion or altered mental status. °? Abdominal or back pain. °? Itchiness. °· You have a fever. °· Your body is producing less urine. °· You have pain or bleeding when you urinate. °Summary °· Acute kidney injury is a sudden worsening of kidney function. °· Acute kidney injury can be caused by problems with blood flow to the kidneys, direct damage to the kidneys, and sudden blockage of urine flow. °· Symptoms of this condition may not be obvious until it becomes severe. Symptoms may include edema, lethargy, confusion, nausea or vomiting, and problems passing urine. °· This condition can usually be diagnosed with blood tests, urine tests, and imaging tests. Sometimes a kidney biopsy is done to diagnose this condition. °· Treatment for this condition often involves treating the underlying cause. It is treated with fluids, medicines, dialysis, diet changes, or surgery. °This information is not intended to replace advice given to you by your health care provider. Make  sure you discuss any questions you have with your health care provider. °Document Released: 02/10/2011 Document Revised: 07/10/2017 Document Reviewed: 07/18/2016 °Elsevier Patient Education © 2020 [employer] ° °

## 2019-03-18 LAB — CULTURE, BLOOD (ROUTINE X 2)
Culture: NO GROWTH
Culture: NO GROWTH

## 2019-03-21 ENCOUNTER — Encounter (HOSPITAL_BASED_OUTPATIENT_CLINIC_OR_DEPARTMENT_OTHER): Payer: No Typology Code available for payment source | Attending: Internal Medicine

## 2019-03-21 DIAGNOSIS — K627 Radiation proctitis: Secondary | ICD-10-CM | POA: Diagnosis not present

## 2019-03-21 DIAGNOSIS — K604 Rectal fistula: Secondary | ICD-10-CM | POA: Diagnosis not present

## 2019-03-21 DIAGNOSIS — Z8546 Personal history of malignant neoplasm of prostate: Secondary | ICD-10-CM | POA: Diagnosis not present

## 2019-03-21 DIAGNOSIS — Y842 Radiological procedure and radiotherapy as the cause of abnormal reaction of the patient, or of later complication, without mention of misadventure at the time of the procedure: Secondary | ICD-10-CM | POA: Diagnosis not present

## 2019-03-22 DIAGNOSIS — K627 Radiation proctitis: Secondary | ICD-10-CM | POA: Diagnosis not present

## 2019-03-23 DIAGNOSIS — K627 Radiation proctitis: Secondary | ICD-10-CM | POA: Diagnosis not present

## 2019-03-24 DIAGNOSIS — K627 Radiation proctitis: Secondary | ICD-10-CM | POA: Diagnosis not present

## 2019-03-25 DIAGNOSIS — K627 Radiation proctitis: Secondary | ICD-10-CM | POA: Diagnosis not present

## 2019-03-28 DIAGNOSIS — K627 Radiation proctitis: Secondary | ICD-10-CM | POA: Diagnosis not present

## 2019-03-29 DIAGNOSIS — K627 Radiation proctitis: Secondary | ICD-10-CM | POA: Diagnosis not present

## 2019-03-30 DIAGNOSIS — K627 Radiation proctitis: Secondary | ICD-10-CM | POA: Diagnosis not present

## 2019-03-31 DIAGNOSIS — K627 Radiation proctitis: Secondary | ICD-10-CM | POA: Diagnosis not present

## 2019-04-01 DIAGNOSIS — K627 Radiation proctitis: Secondary | ICD-10-CM | POA: Diagnosis not present

## 2019-04-04 DIAGNOSIS — K627 Radiation proctitis: Secondary | ICD-10-CM | POA: Diagnosis not present

## 2019-04-05 DIAGNOSIS — K627 Radiation proctitis: Secondary | ICD-10-CM | POA: Diagnosis not present

## 2019-04-06 DIAGNOSIS — K627 Radiation proctitis: Secondary | ICD-10-CM | POA: Diagnosis not present

## 2019-04-07 DIAGNOSIS — K627 Radiation proctitis: Secondary | ICD-10-CM | POA: Diagnosis not present

## 2019-04-08 DIAGNOSIS — K627 Radiation proctitis: Secondary | ICD-10-CM | POA: Diagnosis not present

## 2019-04-12 ENCOUNTER — Encounter (HOSPITAL_BASED_OUTPATIENT_CLINIC_OR_DEPARTMENT_OTHER): Payer: No Typology Code available for payment source | Attending: Internal Medicine

## 2019-04-12 DIAGNOSIS — Y842 Radiological procedure and radiotherapy as the cause of abnormal reaction of the patient, or of later complication, without mention of misadventure at the time of the procedure: Secondary | ICD-10-CM | POA: Insufficient documentation

## 2019-04-12 DIAGNOSIS — K627 Radiation proctitis: Secondary | ICD-10-CM | POA: Insufficient documentation

## 2019-11-01 ENCOUNTER — Encounter (HOSPITAL_COMMUNITY): Payer: Self-pay | Admitting: *Deleted

## 2019-11-01 ENCOUNTER — Emergency Department (HOSPITAL_COMMUNITY): Payer: No Typology Code available for payment source

## 2019-11-01 ENCOUNTER — Other Ambulatory Visit: Payer: Self-pay

## 2019-11-01 ENCOUNTER — Observation Stay (HOSPITAL_COMMUNITY)
Admission: EM | Admit: 2019-11-01 | Discharge: 2019-11-02 | Disposition: A | Discharge status: Home / Self Care | Payer: No Typology Code available for payment source | Attending: Internal Medicine | Admitting: Internal Medicine

## 2019-11-01 DIAGNOSIS — E785 Hyperlipidemia, unspecified: Secondary | ICD-10-CM | POA: Diagnosis not present

## 2019-11-01 DIAGNOSIS — Z8546 Personal history of malignant neoplasm of prostate: Secondary | ICD-10-CM | POA: Diagnosis not present

## 2019-11-01 DIAGNOSIS — Z20822 Contact with and (suspected) exposure to covid-19: Secondary | ICD-10-CM | POA: Diagnosis not present

## 2019-11-01 DIAGNOSIS — R55 Syncope and collapse: Secondary | ICD-10-CM | POA: Diagnosis present

## 2019-11-01 DIAGNOSIS — E871 Hypo-osmolality and hyponatremia: Secondary | ICD-10-CM | POA: Diagnosis not present

## 2019-11-01 DIAGNOSIS — Z23 Encounter for immunization: Secondary | ICD-10-CM | POA: Diagnosis not present

## 2019-11-01 DIAGNOSIS — Y92008 Other place in unspecified non-institutional (private) residence as the place of occurrence of the external cause: Secondary | ICD-10-CM | POA: Insufficient documentation

## 2019-11-01 DIAGNOSIS — D539 Nutritional anemia, unspecified: Secondary | ICD-10-CM

## 2019-11-01 DIAGNOSIS — Y9301 Activity, walking, marching and hiking: Secondary | ICD-10-CM | POA: Insufficient documentation

## 2019-11-01 DIAGNOSIS — S0101XA Laceration without foreign body of scalp, initial encounter: Secondary | ICD-10-CM | POA: Insufficient documentation

## 2019-11-01 DIAGNOSIS — I951 Orthostatic hypotension: Secondary | ICD-10-CM | POA: Diagnosis not present

## 2019-11-01 DIAGNOSIS — Z8673 Personal history of transient ischemic attack (TIA), and cerebral infarction without residual deficits: Secondary | ICD-10-CM | POA: Diagnosis not present

## 2019-11-01 DIAGNOSIS — Y999 Unspecified external cause status: Secondary | ICD-10-CM | POA: Diagnosis not present

## 2019-11-01 DIAGNOSIS — W01190A Fall on same level from slipping, tripping and stumbling with subsequent striking against furniture, initial encounter: Secondary | ICD-10-CM | POA: Diagnosis not present

## 2019-11-01 DIAGNOSIS — Z79899 Other long term (current) drug therapy: Secondary | ICD-10-CM | POA: Insufficient documentation

## 2019-11-01 DIAGNOSIS — G4733 Obstructive sleep apnea (adult) (pediatric): Secondary | ICD-10-CM | POA: Diagnosis not present

## 2019-11-01 DIAGNOSIS — N289 Disorder of kidney and ureter, unspecified: Secondary | ICD-10-CM

## 2019-11-01 DIAGNOSIS — I10 Essential (primary) hypertension: Secondary | ICD-10-CM | POA: Insufficient documentation

## 2019-11-01 LAB — CBC WITH DIFFERENTIAL/PLATELET
Abs Immature Granulocytes: 0.01 10*3/uL (ref 0.00–0.07)
Basophils Absolute: 0 10*3/uL (ref 0.0–0.1)
Basophils Relative: 1 %
Eosinophils Absolute: 0.1 10*3/uL (ref 0.0–0.5)
Eosinophils Relative: 3 %
HCT: 37 % — ABNORMAL LOW (ref 39.0–52.0)
Hemoglobin: 12.2 g/dL — ABNORMAL LOW (ref 13.0–17.0)
Immature Granulocytes: 0 %
Lymphocytes Relative: 21 %
Lymphs Abs: 0.9 10*3/uL (ref 0.7–4.0)
MCH: 35.3 pg — ABNORMAL HIGH (ref 26.0–34.0)
MCHC: 33 g/dL (ref 30.0–36.0)
MCV: 106.9 fL — ABNORMAL HIGH (ref 80.0–100.0)
Monocytes Absolute: 0.7 10*3/uL (ref 0.1–1.0)
Monocytes Relative: 16 %
Neutro Abs: 2.4 10*3/uL (ref 1.7–7.7)
Neutrophils Relative %: 59 %
Platelets: 153 10*3/uL (ref 150–400)
RBC: 3.46 MIL/uL — ABNORMAL LOW (ref 4.22–5.81)
RDW: 11.9 % (ref 11.5–15.5)
WBC: 4 10*3/uL (ref 4.0–10.5)
nRBC: 0 % (ref 0.0–0.2)

## 2019-11-01 MED ORDER — TETANUS-DIPHTH-ACELL PERTUSSIS 5-2.5-18.5 LF-MCG/0.5 IM SUSP
0.5000 mL | Freq: Once | INTRAMUSCULAR | Status: AC
  Administered 2019-11-02: 0.5 mL via INTRAMUSCULAR
  Filled 2019-11-01: qty 0.5

## 2019-11-01 NOTE — ED Triage Notes (Signed)
Pt brought in by [facility] for c/o syncopal episode x 3 times tonight; pt states he was getting up from bed to go to kitchen and get something to eat and he passed out 3 times on the way to the kitchen; pt fell backwards onto the stove and broke the glass out of the stove; pt has a small laceration to the back of his head, minimal bleeding noted

## 2019-11-01 NOTE — ED Provider Notes (Signed)
[facility] EMERGENCY DEPARTMENT Provider Note   CSN: [id number] Arrival date & time: 11/01/19  2306   History Chief Complaint  Patient presents with  . Loss of Consciousness    Phillip Blackwell is a 59 y.o. male.  The history is provided by the patient.  Loss of Consciousness He has history of hypertension, hyperlipidemia, stroke and comes in because of syncopal episodes x3 tonight. He states that he got up to go to the kitchen to get something to eat when he passed out without any warning. Loss of consciousness was estimated at 1-2 minutes. He initially felt weak, but he got up to continue his trip to the bathroom and he passed out again, again without warning. This time he hit the back of his head on a glass table and broke it and suffered a laceration. Loss of consciousness again was estimated at 1-2 minutes. He was initially weak, but got up and had a third syncopal episode which was similar to the others. He had no warning of syncope about to occur. He denies chest pain, heaviness, tightness, pressure. He denies palpitations. He denies nausea, vomiting, diaphoresis.  Past Medical History:  Diagnosis Date  . Anxiety   . Cancer ([place]) 2016  . Depression   . GERD (gastroesophageal reflux disease)   . Hypertension   . Rectal bleeding 07/07/2018  . Rectal ulceration   . Sleep apnea   . Stroke [facility])    2014    Patient Active Problem List   Diagnosis Date Noted  . Abscess 03/13/2019  . Hyperkalemia 03/13/2019  . ARF (acute renal failure) ([place]) 03/13/2019  . Abnormal liver function 03/13/2019  . UTI (urinary tract infection) 10/18/2018  . Acute blood loss anemia 10/18/2018  . Rectal ulceration   . Symptomatic anemia 10/17/2018  . Rectal bleeding 10/17/2018  . Anemia   . Malnutrition of moderate degree 08/19/2018  . SIRS (systemic inflammatory response syndrome) ([place]) 08/18/2018  . Severe sepsis ([place]) 08/14/2018  . AKI (acute kidney injury) ([place]) 08/14/2018  . Serum total  bilirubin elevated 08/14/2018  . Hematochezia 08/14/2018  . Hyponatremia 08/14/2018  . Normal anion gap metabolic acidosis [id number]  . 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 OSA (obstructive sleep apnea) 08/14/2018  . Lower GI bleed 07/07/2018  . HTN (hypertension) 07/07/2018  . HLD (hyperlipidemia) 07/07/2018  . Prostate cancer ([place]) 07/07/2018    Past Surgical History:  Procedure Laterality Date  . BIOPSY  07/10/2018   Procedure: BIOPSY;  Surgeon: Otis Brace, MD;  Location: [place];  Service: Gastroenterology;;  . Consuela Mimes N/A 08/18/2018   Procedure: CYSTOSCOPY;  Surgeon: Alexis Frock, MD;  Location: [place];  Service: Urology;  Laterality: N/A;  . FLEXIBLE SIGMOIDOSCOPY N/A 07/10/2018   Procedure: FLEXIBLE SIGMOIDOSCOPY;  Surgeon: Otis Brace, MD;  Location: [place];  Service: Gastroenterology;  Laterality: N/A;  Adult EGD Scope. Peds biopsy forceps.  Marland Kitchen FLEXIBLE SIGMOIDOSCOPY N/A 10/19/2018   Procedure: FLEXIBLE SIGMOIDOSCOPY;  Surgeon: Clarene Essex, MD;  Location: [place];  Service: Endoscopy;  Laterality: N/A;  . HERNIA REPAIR    . IRRIGATION AND DEBRIDEMENT ABSCESS N/A 08/18/2018   Procedure: IRRIGATION AND DEBRIDEMENT penal scrotal ABSCESS;  Surgeon: Alexis Frock, MD;  Location: [place];  Service: Urology;  Laterality: N/A;  . IRRIGATION AND DEBRIDEMENT ABSCESS N/A 09/01/2018   Procedure: IRRIGATION AND DEBRIDEMENT ABSCESS;  Surgeon: Alexis Frock, MD;  Location: [place];  Service: Urology;  Laterality: N/A;  . LAPAROSCOPIC PARTIAL COLECTOMY N/A 08/23/2018   Procedure: LAPAROSCOPIC  END  COLOSTOMY CREATION;  Surgeon: Donnie Mesa, MD;  Location: [place];  Service: General;  Laterality: N/A;  . SCROTAL EXPLORATION N/A 08/23/2018   Procedure: SECONDARY PENILE/SCROTUM DEBRIDEMENT;  Surgeon: Alexis Frock, MD;  Location: [place];  Service: Urology;  Laterality: N/A;  . SCROTAL EXPLORATION N/A 08/26/2018   Procedure: THIRD STAGE PENIS AND SCROTAL IRRIGATION  AND  DEBRIDEMENT;  Surgeon: Alexis Frock, MD;  Location: [place];  Service: Urology;  Laterality: N/A;       Family History  Problem Relation Age of Onset  . Alzheimer's disease Mother     Social History   Tobacco Use  . Smoking status: Never Smoker  . Smokeless tobacco: Never Used  Substance Use Topics  . Alcohol use: No  . Drug use: No    Home Medications Prior to Admission medications   Medication Sig Start Date End Date Taking? Authorizing Provider  atorvastatin (LIPITOR) 40 MG tablet Take 40 mg by mouth daily with supper.     [provider]  chlorproMAZINE (THORAZINE) 25 MG tablet Take 25 mg by mouth at bedtime.    [provider]  Cholecalciferol (VITAMIN D3) 2000 units TABS Take 2,000 Units by mouth daily.     [provider]  clonazePAM (KLONOPIN) 1 MG tablet Take 1 mg by mouth at bedtime.     [provider]  Docusate Sodium (STOOL SOFTENER LAXATIVE PO) Take 1 tablet by mouth 2 (two) times a week.    [provider]  DULoxetine (CYMBALTA) 60 MG capsule Take 60 mg by mouth daily with supper.     [provider]  ferrous sulfate 325 (65 FE) MG tablet Take 1 tablet (325 mg total) by mouth daily with breakfast. Patient taking differently: Take 325 mg by mouth 3 (three) times a week.  07/11/18   Thurnell Lose, MD  gabapentin (NEURONTIN) 300 MG capsule Take 300 mg by mouth 2 (two) times a day.     [provider]  Gauze Pads & Dressings (DRESSING SPONGES) 4"X4" PADS Provide 1 month supply for daily wet and dry dressing - along with sterile Saline bottle. 09/03/18   Thurnell Lose, MD  LACTOBACILLUS PO Take 1 capsule by mouth daily.     [provider]  metoprolol tartrate (LOPRESSOR) 100 MG tablet Take 100 mg by mouth daily.     [provider]  Multiple Vitamins-Minerals (MULTIVITAMIN WITH MINERALS) tablet Take 1 tablet by mouth daily.    [provider]  omeprazole (PRILOSEC) 40  MG capsule Take 40 mg by mouth daily as needed (indigestion.).     [provider]  oxyCODONE (OXY IR/ROXICODONE) 5 MG immediate release tablet Take 1 tablet (5 mg total) by mouth daily as needed for severe pain or breakthrough pain (try Tramadol 1st). 03/17/19   Kayleen Memos, DO  polycarbophil (FIBERCON) 625 MG tablet Take 625 mg by mouth 3 (three) times a week.     [provider]  prazosin (MINIPRESS) 1 MG capsule Take 1 mg by mouth at bedtime.     [provider]  traMADol (ULTRAM) 50 MG tablet Take 1 tablet (50 mg total) by mouth every 12 (twelve) hours as needed for moderate pain. 03/17/19   Kayleen Memos, DO    Allergies    Patient has no known allergies.  Review of Systems   Review of Systems  Cardiovascular: Positive for syncope.  All other systems reviewed and are negative.   Physical Exam Updated Vital Signs  BP 127/62 (BP Location: Left Arm)   Pulse 70   Temp 98.2 F (36.8 C) (Oral)   Resp 19   Ht 6' (1.829 m)   Wt 79.4 kg   SpO2 100%   BMI 23.73 kg/m   Physical Exam Vitals and nursing note reviewed.   59 year old male, resting comfortably and in no acute distress. Vital signs are normal. Oxygen saturation is 100%, which is normal. Head is normocephalic. There are 2 small lacerations present on the occiput. PERRLA, EOMI. Oropharynx is clear. Neck is nontender without adenopathy or JVD. There are no carotid bruits. Back is nontender and there is no CVA tenderness. Lungs are clear without rales, wheezes, or rhonchi. Chest is nontender. Heart has regular rate and rhythm without murmur. Abdomen is soft, flat, nontender without masses or hepatosplenomegaly and peristalsis is normoactive. Extremities have no cyanosis or edema, full range of motion is present. Skin is warm and dry without rash. Neurologic: Mental status is normal, cranial nerves are intact, there are no motor or sensory deficits.  ED Results / Procedures / Treatments    Labs (all labs ordered are listed, but only abnormal results are displayed) Labs Reviewed  BASIC METABOLIC PANEL - Abnormal; Notable for the following components:      Result Value   Sodium 133 (*)    CO2 19 (*)    Glucose, Bld 103 (*)    Creatinine, Ser 1.27 (*)    Calcium 8.1 (*)    All other components within normal limits  CBC WITH DIFFERENTIAL/PLATELET - Abnormal; Notable for the following components:   RBC 3.46 (*)    Hemoglobin 12.2 (*)    HCT 37.0 (*)    MCV 106.9 (*)    MCH 35.3 (*)    All other components within normal limits  MAGNESIUM  TROPONIN I (HIGH SENSITIVITY)  TROPONIN I (HIGH SENSITIVITY)    EKG EKG Interpretation  Date/Time:  Tuesday November 01 2019 23:13:40 EDT Ventricular Rate:  69 PR Interval:    QRS Duration: 107 QT Interval:  411 QTC Calculation: 441 R Axis:   94 Text Interpretation: Sinus rhythm Ventricular premature complex Anterior infarct, old When compared with ECG of 03/13/2019, Premature ventricular complexes are now present Confirmed by Delora Fuel ([id number]) on 11/01/2019 11:16:59 PM   Radiology DG Chest 2 View  Result Date: 11/01/2019 CLINICAL DATA:  Syncope EXAM: CHEST - 2 VIEW COMPARISON:  March 13, 2019 FINDINGS: The heart size and mediastinal contours are within normal limits. Both lungs are clear. The visualized skeletal structures are unremarkable. IMPRESSION: No active cardiopulmonary disease. Electronically Signed   By: Prudencio Pair M.D.   On: 11/01/2019 23:54   CT Head Wo Contrast  Result Date: 11/02/2019 CLINICAL DATA:  Syncope, fall EXAM: CT HEAD WITHOUT CONTRAST TECHNIQUE: Contiguous axial images were obtained from the base of the skull through the vertex without intravenous contrast. COMPARISON:  None. FINDINGS: Brain: No acute intracranial abnormality. Specifically, no hemorrhage, hydrocephalus, mass lesion, acute infarction, or significant intracranial injury. Vascular: No hyperdense vessel or unexpected calcification. Skull: No  acute calvarial abnormality. Sinuses/Orbits: Visualized paranasal sinuses and mastoids clear. Orbital soft tissues unremarkable. Other: None IMPRESSION: No acute intracranial abnormality. Electronically Signed   By: Rolm Baptise M.D.   On: 11/02/2019 00:07   CT Cervical Spine Wo Contrast  Result Date: 11/02/2019 CLINICAL DATA:  Syncope, fall EXAM: CT CERVICAL SPINE WITHOUT CONTRAST TECHNIQUE: Multidetector CT imaging of the cervical spine was performed without intravenous contrast. Multiplanar CT image  reconstructions were also generated. COMPARISON:  None. FINDINGS: Alignment: No subluxation. Skull base and vertebrae: No acute fracture. No primary bone lesion or focal pathologic process. Soft tissues and spinal canal: No prevertebral fluid or swelling. No visible canal hematoma. 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 Disc space narrowing and anterior spurring. Upper chest: No acute findings Other: None IMPRESSION: Degenerative changes as above.  No acute bony abnormality. Electronically Signed   By: Rolm Baptise M.D.   On: 11/02/2019 00:06    Procedures .Marland KitchenLaceration Repair  Date/Time: 11/02/2019 1:18 AM Performed by: Delora Fuel, MD Authorized by: Delora Fuel, MD   Consent:    Consent obtained:  Verbal   Consent given by:  Patient   Risks discussed:  Pain, poor wound healing and infection   Alternatives discussed:  No treatment Anesthesia (see MAR for exact dosages):    Anesthesia method:  None Laceration details:    Location:  Scalp   Scalp location:  Occipital   Length (cm):  1.5   Depth (mm):  3 Repair type:    Repair type:  Simple Pre-procedure details:    Preparation:  Patient was prepped and draped in usual sterile fashion and imaging obtained to evaluate for foreign bodies Exploration:    Hemostasis achieved with:  Direct pressure   Wound exploration: entire depth of wound probed and visualized     Wound extent: no foreign bodies/material noted      Contaminated: no   Treatment:    Area cleansed with:  Saline   Amount of cleaning:  Standard Skin repair:    Repair method:  Staples   Number of staples:  3 Approximation:    Approximation:  Close Post-procedure details:    Dressing:  Open (no dressing)   Patient tolerance of procedure:  Tolerated well, no immediate complications .Marland KitchenLaceration Repair  Date/Time: 11/02/2019 1:19 AM Performed by: Delora Fuel, MD Authorized by: Delora Fuel, MD   Consent:    Consent obtained:  Verbal   Consent given by:  Patient   Risks discussed:  Pain, infection and poor wound healing   Alternatives discussed:  No treatment Anesthesia (see MAR for exact dosages):    Anesthesia method:  None Laceration details:    Location:  Scalp   Scalp location:  Occipital   Length (cm):  0.5   Depth (mm):  3 Repair type:    Repair type:  Simple Pre-procedure details:    Preparation:  Patient was prepped and draped in usual sterile fashion and imaging obtained to evaluate for foreign bodies Exploration:    Hemostasis achieved with:  Direct pressure   Wound exploration: entire depth of wound probed and visualized     Wound extent: no foreign bodies/material noted     Contaminated: no   Treatment:    Area cleansed with:  Saline   Amount of cleaning:  Standard Skin repair:    Repair method:  Staples   Number of staples:  1 Approximation:    Approximation:  Close Post-procedure details:    Dressing:  Open (no dressing)   Patient tolerance of procedure:  Tolerated well, no immediate complications   Medications Ordered in ED Medications  0.9 % NaCl with KCl 40 mEq / L  infusion (has no administration in time range)  Tdap (BOOSTRIX) injection 0.5 mL (0.5 mLs Intramuscular Given 11/02/19 0008)  sodium chloride 0.9 % bolus 1,000 mL (1,000 mLs Intravenous New Bag/Given 11/02/19 0106)    ED Course  I have reviewed  the triage vital signs and the nursing notes.  Pertinent labs & imaging results that  were available during my care of the patient were reviewed by me and considered in my medical decision making (see chart for details).  MDM Rules/Calculators/A&P Syncope x3. ECG is unremarkable except for single PVC. 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 He does state that he had recently received the first injection of COVID-19 vaccine. With multiple syncopal episodes, I feel he will definitely need to be admitted. Screening labs are obtained. With head injury, he is being sent for CT of head and cervical spine.  CT scan showed no acute injury.  Chest x-ray is unremarkable.  Labs show mild anemia which is actually significantly improved over baseline.  Mild renal insufficiency is noted which is also slightly improved over baseline.  Orthostatic vital signs did show dramatic drop in blood pressure, and this is most likely the mechanism of his syncope.  Because of multiple episodes of syncope, it is felt that he should be admitted for observation.  He is given IV fluids.  Case is discussed with Dr. Olevia Bowens of [facility], who agrees to admit the patient.  Final Clinical Impression(s) / ED Diagnoses Final diagnoses:  Syncope, unspecified syncope type  Orthostatic hypotension  Scalp laceration, initial encounter  Macrocytic anemia  Renal insufficiency    Rx / DC Orders ED Discharge Orders    None       Delora Fuel, MD [id number]

## 2019-11-02 ENCOUNTER — Encounter (HOSPITAL_COMMUNITY): Payer: Self-pay | Admitting: Internal Medicine

## 2019-11-02 ENCOUNTER — Other Ambulatory Visit (HOSPITAL_COMMUNITY): Payer: No Typology Code available for payment source

## 2019-11-02 DIAGNOSIS — R55 Syncope and collapse: Secondary | ICD-10-CM | POA: Diagnosis present

## 2019-11-02 LAB — BASIC METABOLIC PANEL
Anion gap: 12 (ref 5–15)
Anion gap: 7 (ref 5–15)
BUN: 13 mg/dL (ref 6–20)
BUN: 13 mg/dL (ref 6–20)
CO2: 19 mmol/L — ABNORMAL LOW (ref 22–32)
CO2: 22 mmol/L (ref 22–32)
Calcium: 8.1 mg/dL — ABNORMAL LOW (ref 8.9–10.3)
Calcium: 8.2 mg/dL — ABNORMAL LOW (ref 8.9–10.3)
Chloride: 102 mmol/L (ref 98–111)
Chloride: 109 mmol/L (ref 98–111)
Creatinine, Ser: 1.27 mg/dL — ABNORMAL HIGH (ref 0.61–1.24)
Creatinine, Ser: 1.01 mg/dL (ref 0.61–1.24)
GFR calc Af Amer: >60 mL/min (ref >60)
GFR calc Af Amer: >60 mL/min (ref >60)
GFR calc non Af Amer: >60 mL/min (ref >60)
GFR calc non Af Amer: >60 mL/min (ref >60)
Glucose, Bld: 103 mg/dL — ABNORMAL HIGH (ref 70–99)
Glucose, Bld: 147 mg/dL — ABNORMAL HIGH (ref 70–99)
Potassium: 3.5 mmol/L (ref 3.5–5.1)
Potassium: 3.9 mmol/L (ref 3.5–5.1)
Sodium: 133 mmol/L — ABNORMAL LOW (ref 135–145)
Sodium: 138 mmol/L (ref 135–145)

## 2019-11-02 LAB — CBC
HCT: 33.9 % — ABNORMAL LOW (ref 39.0–52.0)
Hemoglobin: 11 g/dL — ABNORMAL LOW (ref 13.0–17.0)
MCH: 34.5 pg — ABNORMAL HIGH (ref 26.0–34.0)
MCHC: 32.4 g/dL (ref 30.0–36.0)
MCV: 106.3 fL — ABNORMAL HIGH (ref 80.0–100.0)
Platelets: 137 10*3/uL — ABNORMAL LOW (ref 150–400)
RBC: 3.19 MIL/uL — ABNORMAL LOW (ref 4.22–5.81)
RDW: 11.8 % (ref 11.5–15.5)
WBC: 4.3 10*3/uL (ref 4.0–10.5)
nRBC: 0 % (ref 0.0–0.2)

## 2019-11-02 LAB — FOLATE: Folate: 8.6 ng/mL (ref >5.9)

## 2019-11-02 LAB — GLUCOSE, CAPILLARY
Glucose-Capillary: 67 mg/dL — ABNORMAL LOW (ref 70–99)
Glucose-Capillary: 77 mg/dL (ref 70–99)

## 2019-11-02 LAB — TROPONIN I (HIGH SENSITIVITY)
Troponin I (High Sensitivity): 7 ng/L (ref <18)
Troponin I (High Sensitivity): 7 ng/L (ref <18)

## 2019-11-02 LAB — MAGNESIUM: Magnesium: 2 mg/dL (ref 1.7–2.4)

## 2019-11-02 LAB — SARS CORONAVIRUS 2 (TAT 6-24 HRS): SARS Coronavirus 2: NEGATIVE

## 2019-11-02 LAB — PHOSPHORUS: Phosphorus: 3.2 mg/dL (ref 2.5–4.6)

## 2019-11-02 LAB — VITAMIN B12: Vitamin B-12: 414 pg/mL (ref 180–914)

## 2019-11-02 MED ORDER — ACETAMINOPHEN 650 MG RE SUPP: 650.0000 mg | Freq: Four times a day (QID) | RECTAL | Status: DC | PRN

## 2019-11-02 MED ORDER — SODIUM CHLORIDE 0.9 % IR SOLN
Freq: Once | Status: DC
  Filled 2019-11-02: qty 500

## 2019-11-02 MED ORDER — PROCHLORPERAZINE EDISYLATE 10 MG/2ML IJ SOLN: 5.0000 mg | INTRAMUSCULAR | Status: DC | PRN

## 2019-11-02 MED ORDER — SODIUM CHLORIDE 0.9% FLUSH
3.0000 mL | Freq: Two times a day (BID) | INTRAVENOUS | Status: DC
  Administered 2019-11-02: 3 mL via INTRAVENOUS

## 2019-11-02 MED ORDER — POTASSIUM CHLORIDE IN NACL 40-0.9 MEQ/L-% IV SOLN
INTRAVENOUS | Status: AC
  Administered 2019-11-02: 250 mL/h via INTRAVENOUS
  Filled 2019-11-02: qty 1000

## 2019-11-02 MED ORDER — SODIUM CHLORIDE 0.9 % IV SOLN: INTRAVENOUS | Status: DC

## 2019-11-02 MED ORDER — SODIUM CHLORIDE 0.9 % IV BOLUS
1000.0000 mL | Freq: Once | INTRAVENOUS | Status: AC
  Administered 2019-11-02: 1000 mL via INTRAVENOUS

## 2019-11-02 MED ORDER — ACETAMINOPHEN 325 MG PO TABS: 650.0000 mg | ORAL_TABLET | Freq: Four times a day (QID) | ORAL | Status: DC | PRN

## 2019-11-02 NOTE — Progress Notes (Signed)
Discussed AVS including medications & need for follow up appointments with the patient and all questioned fully answered. Patient is awaiting ride for discharge. Patient will be taken by wheelchair to discharge/ patient pick up area.

## 2019-11-02 NOTE — Discharge Summary (Signed)
Physician Discharge Summary  Phillip Blackwell [id number] DOB: [birth year] DOA: 11/01/2019  PCP: [facility]  Admit date: 11/01/2019  Discharge date: 11/02/2019  Admitted From:Home  Disposition:  Home  Recommendations for Outpatient Follow-up:  1. Follow up with PCP in 1-2 weeks to remove staples and sutures in the occipital area 2. Continue on home meds as prior, none of which are new 3. Reinforced to take anxiety medication only as needed and to take prazosin near bedtime 4. Continue proper hydration and oral intake and follow blood pressures carefully at home  Home Health: None  Equipment/Devices: None  Discharge Condition: Stable  CODE STATUS: Full  Diet recommendation: Heart Healthy  Brief/Interim Summary: Per HPI: Phillip Blackwell is a 59 y.o. 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  He states that he took a lorazepam prior to going to bed.  He then later woke up to get something to eat in the kitchen, when he passed out the first time without any associated symptomatology.  He then went to the bathroom and passed out again without any prodromal symptoms.  He states that he went back to the kitchen, then fell backwards onto the stove, hit his head, sustaining a laceration on the occipital area and broke the stove glass.  He thinks that he may have passed out for a minute or 2.  He denies chest pain, dyspnea, palpitations, nausea, emesis, diaphoresis, tinnitus or vertigo sensation.  No fever, chills, rhinorrhea or sore throat.  Denies wheezing or hemoptysis.  No abdominal pain, diarrhea, constipation, melena or hematochezia.  No dysuria, frequency hematuria.  Denies polyuria, polydipsia, polyphagia or blurred  vision.  Patient was admitted with multiple syncopal episodes that were related to orthostasis as noted on initial orthostatic studies.  He also had some lacerations to the back of his head as a result of the falls and received suturing and staples.  He had received 1 L normal saline bolus with significant improvement in his orthostasis.  He reports no further lightheadedness, dizziness, or any other symptoms.  He is able to ambulate without any further issues at this time and is stable for discharge on his usual home medications.  He does not need any further studies to include carotid ultrasound or echocardiogram.  His B12 and folate levels are noted to be within normal limits as well.  He is encouraged to continue to follow his blood pressures closely at home and to continue with oral intake as previous.  He does state that he was somewhat dehydrated and had some vomiting for the last couple days which likely precipitated the orthostasis.  No other acute events noted throughout the course of this admission.  Discharge Diagnoses:  Principal Problem:   Syncopal episodes Active Problems:   HTN (hypertension)   HLD (hyperlipidemia)   Hyponatremia   OSA (obstructive sleep apnea)   Macrocytic anemia  Principal discharge diagnosis: Recurrent syncopal episodes related to orthostasis in the setting of dehydration.  Discharge Instructions  Discharge Instructions    Diet - low sodium heart healthy   Complete by: As directed    Increase activity slowly   Complete by: As directed      Allergies as of 11/02/2019   No Known Allergies     Medication List  TAKE these medications   atorvastatin 40 MG tablet Commonly known as: LIPITOR Take 40 mg by mouth daily with supper.   chlorproMAZINE 25 MG tablet Commonly known as: THORAZINE Take 25 mg by mouth at bedtime.   clonazePAM 1 MG tablet Commonly known as: KLONOPIN Take 1 mg by mouth at bedtime.   Dressing Sponges 4"X4" Pads Provide 1  month supply for daily wet and dry dressing - along with sterile Saline bottle.   DULoxetine 60 MG capsule Commonly known as: CYMBALTA Take 60 mg by mouth daily with supper.   ferrous sulfate 325 (65 FE) MG tablet Take 1 tablet (325 mg total) by mouth daily with breakfast. What changed: when to take this   FiberCon 625 MG tablet Generic drug: polycarbophil Take 625 mg by mouth 3 (three) times a week.   gabapentin 300 MG capsule Commonly known as: NEURONTIN Take 300 mg by mouth 2 (two) times a day.   LACTOBACILLUS PO Take 1 capsule by mouth daily.   metoprolol tartrate 100 MG tablet Commonly known as: LOPRESSOR Take 100 mg by mouth daily.   multivitamin with minerals tablet Take 1 tablet by mouth daily.   omeprazole 40 MG capsule Commonly known as: PRILOSEC Take 40 mg by mouth daily as needed (indigestion.).   oxyCODONE 5 MG immediate release tablet Commonly known as: Oxy IR/ROXICODONE Take 1 tablet (5 mg total) by mouth daily as needed for severe pain or breakthrough pain (try Tramadol 1st).   prazosin 1 MG capsule Commonly known as: MINIPRESS Take 1 mg by mouth at bedtime.   STOOL SOFTENER LAXATIVE PO Take 1 tablet by mouth 2 (two) times a week.   traMADol 50 MG tablet Commonly known as: ULTRAM Take 1 tablet (50 mg total) by mouth every 12 (twelve) hours as needed for moderate pain.   Vitamin D3 50 MCG (2000 UT) Tabs Take 2,000 Units by mouth daily.      [place] Follow up in 1 week(s).   Specialty: General Practice Contact information: [place] [id number] [phone]          No Known Allergies  Consultations:  None   Procedures/Studies: DG Chest 2 View  Result Date: 11/01/2019 CLINICAL DATA:  Syncope EXAM: CHEST - 2 VIEW COMPARISON:  March 13, 2019 FINDINGS: The heart size and mediastinal contours are within normal limits. Both lungs are clear. The visualized skeletal structures are  unremarkable. IMPRESSION: No active cardiopulmonary disease. Electronically Signed   By: Prudencio Pair M.D.   On: 11/01/2019 23:54   CT Head Wo Contrast  Result Date: 11/02/2019 CLINICAL DATA:  Syncope, fall EXAM: CT HEAD WITHOUT CONTRAST TECHNIQUE: Contiguous axial images were obtained from the base of the skull through the vertex without intravenous contrast. COMPARISON:  None. FINDINGS: Brain: No acute intracranial abnormality. Specifically, no hemorrhage, hydrocephalus, mass lesion, acute infarction, or significant intracranial injury. Vascular: No hyperdense vessel or unexpected calcification. Skull: No acute calvarial abnormality. Sinuses/Orbits: Visualized paranasal sinuses and mastoids clear. Orbital soft tissues unremarkable. Other: None IMPRESSION: No acute intracranial abnormality. Electronically Signed   By: Rolm Baptise M.D.   On: 11/02/2019 00:07   CT Cervical Spine Wo Contrast  Result Date: 11/02/2019 CLINICAL DATA:  Syncope, fall EXAM: CT CERVICAL SPINE WITHOUT CONTRAST TECHNIQUE: Multidetector CT imaging of the cervical spine was performed without intravenous contrast. Multiplanar CT image reconstructions were also generated. COMPARISON:  None. FINDINGS: Alignment: No subluxation. Skull base and vertebrae: No acute fracture.  No primary bone lesion or focal pathologic process. Soft tissues and spinal canal: No prevertebral fluid or swelling. No visible canal hematoma. 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 Disc space narrowing and anterior spurring. Upper chest: No acute findings Other: None IMPRESSION: Degenerative changes as above.  No acute bony abnormality. Electronically Signed   By: Rolm Baptise M.D.   On: 11/02/2019 00:06     Discharge Exam: Vitals:   11/02/19 0626 11/02/19 0724  BP: (!) 142/77   Pulse: 88   Resp: 18   Temp:    SpO2: 99% 98%   Vitals:   11/02/19 0223 11/02/19 0224 11/02/19 0626 11/02/19 0724  BP:  (!) 165/80 (!) 142/77   Pulse:   79 88   Resp:  18 18   Temp:  98.1 F (36.7 C)    TempSrc:  Oral    SpO2:  100% 99% 98%  Weight: 82 kg     Height: 6' (1.829 m)       General: Pt is alert, awake, not in acute distress Cardiovascular: RRR, S1/S2 +, no rubs, no gallops Respiratory: CTA bilaterally, no wheezing, no rhonchi Abdominal: Soft, NT, ND, bowel sounds + Extremities: no edema, no cyanosis    The results of significant diagnostics from this hospitalization (including imaging, microbiology, ancillary and laboratory) are listed below for reference.     Microbiology: No results found for this or any previous visit (from the past 240 hour(s)).   Labs: BNP (last 3 results) No results for input(s): BNP in the last 8760 hours. Basic Metabolic Panel: Recent Labs  Lab 11/01/19 2326 11/02/19 0109 11/02/19 0756  NA 133*  --  138  K 3.5  --  3.9  CL 102  --  109  CO2 19*  --  22  GLUCOSE 103*  --  147*  BUN 13  --  13  CREATININE 1.27*  --  1.01  CALCIUM 8.1*  --  8.2*  MG  --  2.0  --   PHOS  --  3.2  --    Liver Function Tests: No results for input(s): AST, ALT, ALKPHOS, BILITOT, PROT, ALBUMIN in the last 168 hours. No results for input(s): LIPASE, AMYLASE in the last 168 hours. No results for input(s): AMMONIA in the last 168 hours. CBC: Recent Labs  Lab 11/01/19 2326 11/02/19 0756  WBC 4.0 4.3  NEUTROABS 2.4  --   HGB 12.2* 11.0*  HCT 37.0* 33.9*  MCV 106.9* 106.3*  PLT 153 137*   Cardiac Enzymes: No results for input(s): CKTOTAL, CKMB, CKMBINDEX, TROPONINI in the last 168 hours. BNP: Invalid input(s): POCBNP CBG: Recent Labs  Lab 11/02/19 0531 11/02/19 0551  GLUCAP 67* 77   D-Dimer No results for input(s): DDIMER in the last 72 hours. Hgb A1c No results for input(s): HGBA1C in the last 72 hours. Lipid Profile No results for input(s): CHOL, HDL, LDLCALC, TRIG, CHOLHDL, LDLDIRECT in the last 72 hours. Thyroid function studies No results for input(s): TSH, T4TOTAL, T3FREE,  THYROIDAB in the last 72 hours.  Invalid input(s): FREET3 Anemia work up Recent Labs    11/02/19 0756  VITAMINB12 414  FOLATE 8.6   Urinalysis    Component Value Date/Time   COLORURINE YELLOW 10/17/2018 2154   APPEARANCEUR CLOUDY (A) 10/17/2018 2154   LABSPEC 1.014 10/17/2018 2154   PHURINE 5.0 10/17/2018 2154   GLUCOSEU 50 (A) 10/17/2018 2154   HGBUR LARGE (A) 10/17/2018 2154   BILIRUBINUR NEGATIVE 10/17/2018 2154  BILIRUBINUR negative 09/09/2018 [place] 10/17/2018 2154   PROTEINUR 30 (A) 10/17/2018 2154   UROBILINOGEN 0.2 09/09/2018 1136   NITRITE NEGATIVE 10/17/2018 2154   LEUKOCYTESUR LARGE (A) 10/17/2018 2154   Sepsis Labs Invalid input(s): PROCALCITONIN,  WBC,  LACTICIDVEN Microbiology No results found for this or any previous visit (from the past 240 hour(s)).   Time coordinating discharge: 35 minutes  SIGNED:   Rodena Goldmann, DO [facility] 11/02/2019, 9:56 AM  If 7PM-7AM, please contact night-coverage [email or web address]

## 2019-11-02 NOTE — Plan of Care (Signed)

## 2019-11-02 NOTE — Progress Notes (Signed)
Hypoglycemic Event  CBG: 67  Treatment: 4 oz juice/soda  Symptoms: None  Follow-up CBG: Time:0551 CBG Result:77  Possible Reasons for Event: Inadequate meal intake  Comments/MD notified:snack provided     Normajean Glasgow

## 2019-11-02 NOTE — H&P (Signed)
History and Physical    OLLIVER VASTINE [id number] DOB: 1961-04-22 DOA: 11/01/2019  PCP: [place]   Patient coming from: Home.  I have personally briefly reviewed patient's old medical records in [place]  Chief Complaint: Passed out three times at home.  HPI: Phillip Blackwell is a 59 y.o. 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  He states that he took a lorazepam prior to going to bed.  He then later woke up to get something to eat in the kitchen, when he passed out the first time without any associated symptomatology.  He then went to the bathroom and passed out again without any prodromal symptoms.  He states that he went back to the kitchen, then fell backwards onto the stove, hit his head, sustaining a laceration on the occipital area and broke the stove glass.  He thinks that he may have passed out for a minute or 2.  He denies chest pain, dyspnea, palpitations, nausea, emesis, diaphoresis, tinnitus or vertigo sensation.  No fever, chills, rhinorrhea or sore throat.  Denies wheezing or hemoptysis.  No abdominal pain, diarrhea, constipation, melena or hematochezia.  No dysuria, frequency hematuria.  Denies polyuria, polydipsia, polyphagia or blurred vision.  ED Course: Initial vital signs were temperature 98.2 F, pulse 70, respiration 18, blood pressure 127/62 mmHg and O2 sat 100% on room air.  The patient received a 1000 mL bolus and a Td booster IM.  His occipital wound was sutured by Dr. Roxanne Mins.  His white count is 4.0, hemoglobin 12.2 g/dL with an MCV of 106.9 and platelets one hundred and fifty-three.  Troponin x2 was 7 ng milliliter.  Magnesium 2.0 and phosphorus 3.2 mg/dL.  Sodium one hundred and  thirty-three, potassium 3.5, chloride one oh two and CO2 19 mmol/L.  Glucose one hundred and three, BUN thirteen, creatinine 1.27 calcium 8.1 mg/dL.  Imaging: A 2 view chest radiograph shows no active cardiopulmonary disease.  CT head did not show any acute intracranial abnormality.  CT C-spine shows degenerative changes, but no acute osseous abnormality.  Please see images and full radiology report for further detail.  Review of Systems: As per HPI otherwise 10 point review of systems negative.  Past Medical History:  Diagnosis Date  . Anxiety   . Cancer ([place]) 2016  . Depression   . GERD (gastroesophageal reflux disease)   . Hypertension   . Rectal bleeding 07/07/2018  . Rectal ulceration   . Sleep apnea   . Stroke [facility])    2014    Past Surgical History:  Procedure Laterality Date  . BIOPSY  07/10/2018   Procedure: BIOPSY;  Surgeon: Otis Brace, MD;  Location: [place];  Service: Gastroenterology;;  . Consuela Mimes N/A 08/18/2018   Procedure: CYSTOSCOPY;  Surgeon: Alexis Frock, MD;  Location: [place];  Service: Urology;  Laterality: N/A;  . FLEXIBLE SIGMOIDOSCOPY N/A 07/10/2018   Procedure: FLEXIBLE SIGMOIDOSCOPY;  Surgeon: Otis Brace, MD;  Location: [place];  Service: Gastroenterology;  Laterality: N/A;  Adult EGD Scope. Peds biopsy forceps.  Marland Kitchen FLEXIBLE SIGMOIDOSCOPY N/A 10/19/2018   Procedure: FLEXIBLE SIGMOIDOSCOPY;  Surgeon: Clarene Essex, MD;  Location: [place];  Service: Endoscopy;  Laterality: N/A;  . HERNIA REPAIR    . IRRIGATION AND DEBRIDEMENT ABSCESS N/A 08/18/2018  Procedure: IRRIGATION AND DEBRIDEMENT penal scrotal ABSCESS;  Surgeon: Alexis Frock, MD;  Location: [place];  Service: Urology;  Laterality: N/A;  . IRRIGATION AND DEBRIDEMENT ABSCESS N/A 09/01/2018   Procedure: IRRIGATION AND DEBRIDEMENT ABSCESS;  Surgeon: Alexis Frock, MD;  Location: [place];  Service: Urology;  Laterality: N/A;  . LAPAROSCOPIC PARTIAL COLECTOMY N/A 08/23/2018    Procedure: LAPAROSCOPIC  END COLOSTOMY CREATION;  Surgeon: Donnie Mesa, MD;  Location: [place];  Service: General;  Laterality: N/A;  . SCROTAL EXPLORATION N/A 08/23/2018   Procedure: SECONDARY PENILE/SCROTUM DEBRIDEMENT;  Surgeon: Alexis Frock, MD;  Location: [place];  Service: Urology;  Laterality: N/A;  . SCROTAL EXPLORATION N/A 08/26/2018   Procedure: THIRD STAGE PENIS AND SCROTAL IRRIGATION AND  DEBRIDEMENT;  Surgeon: Alexis Frock, MD;  Location: [place];  Service: Urology;  Laterality: N/A;     reports that he has never smoked. He has never used smokeless tobacco. He reports that he does not drink alcohol or use drugs.  No Known Allergies  Family History  Problem Relation Age of Onset  . Alzheimer's disease Mother    Prior to Admission medications   Medication Sig Start Date End Date Taking? Authorizing Provider  atorvastatin (LIPITOR) 40 MG tablet Take 40 mg by mouth daily with supper.     [provider]  chlorproMAZINE (THORAZINE) 25 MG tablet Take 25 mg by mouth at bedtime.    [provider]  Cholecalciferol (VITAMIN D3) 2000 units TABS Take 2,000 Units by mouth daily.     [provider]  clonazePAM (KLONOPIN) 1 MG tablet Take 1 mg by mouth at bedtime.     [provider]  Docusate Sodium (STOOL SOFTENER LAXATIVE PO) Take 1 tablet by mouth 2 (two) times a week.    [provider]  DULoxetine (CYMBALTA) 60 MG capsule Take 60 mg by mouth daily with supper.     [provider]  ferrous sulfate 325 (65 FE) MG tablet Take 1 tablet (325 mg total) by mouth daily with breakfast. Patient taking differently: Take 325 mg by mouth 3 (three) times a week.  07/11/18   Thurnell Lose, MD  gabapentin (NEURONTIN) 300 MG capsule Take 300 mg by mouth 2 (two) times a day.     [provider]  Gauze Pads & Dressings (DRESSING SPONGES) 4"X4" PADS Provide 1 month supply for daily wet and dry dressing - along with sterile Saline bottle.  09/03/18   Thurnell Lose, MD  LACTOBACILLUS PO Take 1 capsule by mouth daily.     [provider]  metoprolol tartrate (LOPRESSOR) 100 MG tablet Take 100 mg by mouth daily.     [provider]  Multiple Vitamins-Minerals (MULTIVITAMIN WITH MINERALS) tablet Take 1 tablet by mouth daily.    [provider]  omeprazole (PRILOSEC) 40 MG capsule Take 40 mg by mouth daily as needed (indigestion.).     [provider]  oxyCODONE (OXY IR/ROXICODONE) 5 MG immediate release tablet Take 1 tablet (5 mg total) by mouth daily as needed for severe pain or breakthrough pain (try Tramadol 1st). 03/17/19   Kayleen Memos, DO  polycarbophil (FIBERCON) 625 MG tablet Take 625 mg by mouth 3 (three) times a week.     [provider]  prazosin (MINIPRESS) 1 MG capsule Take 1 mg by mouth at bedtime.     [provider]  traMADol (ULTRAM) 50 MG tablet Take 1 tablet (50 mg total) by mouth every 12 (twelve) hours  as needed for moderate pain. 03/17/19   Kayleen Memos, DO    Physical Exam: Vitals:   11/01/19 2311 11/01/19 2316 11/02/19 0130  BP:  127/62 (!) 158/75  Pulse:  70 74  Resp:  19 15  Temp:  98.2 F (36.8 C)   TempSrc:  Oral   SpO2:  100% 100%  Weight: 79.4 kg    Height: 6' (1.829 m)      Constitutional: NAD, calm, comfortable Eyes: PERRL, lids and conjunctivae normal ENMT: Mucous membranes are moist. Posterior pharynx clear of any exudate or lesions. Neck: normal, supple, no masses, no thyromegaly Respiratory: clear to auscultation bilaterally, no wheezing, no crackles. Normal respiratory effort. No accessory muscle use.  Cardiovascular: Regular rate and rhythm, no murmurs / rubs / gallops. No extremity edema. 2+ pedal pulses. No carotid bruits.  Abdomen: Nondistended.  Colostomy bag in place on the left side.  BS positive.  Soft, no tenderness, no masses palpated. No hepatosplenomegaly.  Musculoskeletal: no clubbing / cyanosis. Good ROM, no  contractures. Normal muscle tone.  Skin: Suture occipital wound.  No bleeding. Neurologic: CN 2-12 grossly intact. Sensation intact, DTR normal. Strength 5/5 in all 4.  Psychiatric: Normal judgment and insight. Alert and oriented x 3. Normal mood.   Labs on Admission: I have personally reviewed following labs and imaging studies  CBC: Recent Labs  Lab 11/01/19 2326  WBC 4.0  NEUTROABS 2.4  HGB 12.2*  HCT 37.0*  MCV 106.9*  PLT 0000000   Basic Metabolic Panel: Recent Labs  Lab 11/01/19 2326 11/02/19 0109  NA 133*  --   K 3.5  --   CL 102  --   CO2 19*  --   GLUCOSE 103*  --   BUN 13  --   CREATININE 1.27*  --   CALCIUM 8.1*  --   MG  --  2.0  PHOS  --  3.2   GFR: Estimated Creatinine Clearance: 67.9 mL/min (A) (by C-G formula based on SCr of 1.27 mg/dL (H)). Liver Function Tests: No results for input(s): AST, ALT, ALKPHOS, BILITOT, PROT, ALBUMIN in the last 168 hours. No results for input(s): LIPASE, AMYLASE in the last 168 hours. No results for input(s): AMMONIA in the last 168 hours. Coagulation Profile: No results for input(s): INR, PROTIME in the last 168 hours. Cardiac Enzymes: No results for input(s): CKTOTAL, CKMB, CKMBINDEX, TROPONINI in the last 168 hours. BNP (last 3 results) No results for input(s): PROBNP in the last 8760 hours. HbA1C: No results for input(s): HGBA1C in the last 72 hours. CBG: No results for input(s): GLUCAP in the last 168 hours. Lipid Profile: No results for input(s): CHOL, HDL, LDLCALC, TRIG, CHOLHDL, LDLDIRECT in the last 72 hours. Thyroid Function Tests: No results for input(s): TSH, T4TOTAL, FREET4, T3FREE, THYROIDAB in the last 72 hours. Anemia Panel: No results for input(s): VITAMINB12, FOLATE, FERRITIN, TIBC, IRON, RETICCTPCT in the last 72 hours. Urine analysis:    Component Value Date/Time   COLORURINE YELLOW 10/17/2018 2154   APPEARANCEUR CLOUDY (A) 10/17/2018 2154   LABSPEC 1.014 10/17/2018 2154   PHURINE 5.0  10/17/2018 2154   GLUCOSEU 50 (A) 10/17/2018 2154   HGBUR LARGE (A) 10/17/2018 2154   BILIRUBINUR NEGATIVE 10/17/2018 2154   BILIRUBINUR negative 09/09/2018 1136   KETONESUR NEGATIVE 10/17/2018 2154   PROTEINUR 30 (A) 10/17/2018 2154   UROBILINOGEN 0.2 09/09/2018 1136   NITRITE NEGATIVE 10/17/2018 2154   LEUKOCYTESUR LARGE (A) 10/17/2018 2154    Radiological Exams on  Admission: DG Chest 2 View  Result Date: 11/01/2019 CLINICAL DATA:  Syncope EXAM: CHEST - 2 VIEW COMPARISON:  March 13, 2019 FINDINGS: The heart size and mediastinal contours are within normal limits. Both lungs are clear. The visualized skeletal structures are unremarkable. IMPRESSION: No active cardiopulmonary disease. Electronically Signed   By: Prudencio Pair M.D.   On: 11/01/2019 23:54   CT Head Wo Contrast  Result Date: 11/02/2019 CLINICAL DATA:  Syncope, fall EXAM: CT HEAD WITHOUT CONTRAST TECHNIQUE: Contiguous axial images were obtained from the base of the skull through the vertex without intravenous contrast. COMPARISON:  None. FINDINGS: Brain: No acute intracranial abnormality. Specifically, no hemorrhage, hydrocephalus, mass lesion, acute infarction, or significant intracranial injury. Vascular: No hyperdense vessel or unexpected calcification. Skull: No acute calvarial abnormality. Sinuses/Orbits: Visualized paranasal sinuses and mastoids clear. Orbital soft tissues unremarkable. Other: None IMPRESSION: No acute intracranial abnormality. Electronically Signed   By: Rolm Baptise M.D.   On: 11/02/2019 00:07   CT Cervical Spine Wo Contrast  Result Date: 11/02/2019 CLINICAL DATA:  Syncope, fall EXAM: CT CERVICAL SPINE WITHOUT CONTRAST TECHNIQUE: Multidetector CT imaging of the cervical spine was performed without intravenous contrast. Multiplanar CT image reconstructions were also generated. COMPARISON:  None. FINDINGS: Alignment: No subluxation. Skull base and vertebrae: No acute fracture. No primary bone lesion or focal  pathologic process. Soft tissues and spinal canal: No prevertebral fluid or swelling. No visible canal hematoma. 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 Disc space narrowing and anterior spurring. Upper chest: No acute findings Other: None IMPRESSION: Degenerative changes as above.  No acute bony abnormality. Electronically Signed   By: Rolm Baptise M.D.   On: 11/02/2019 00:06   EKG: Independently reviewed. Vent. rate 69 BPM PR interval * ms QRS duration 107 ms QT/QTc 411/441 ms P-R-T axes 43 94 87 Sinus rhythm Ventricular premature complex Anterior infarct, old When compared with ECG of 03/13/2019, Premature ventricular complexes are now present  Assessment/Plan Principal Problem:   Syncopal episodes Suspect medication side effect. Observation/telemetry. Continue NS infusion. Hold antihypertensives. Hold sedatives. Check echocardiogram. Check carotid Doppler. Consult cardiology.  Active Problems:   HTN (hypertension) Antihypertensives. Monitor BP.    HLD (hyperlipidemia) Continue atorvastatin 40 mg p.o. daily.    Hyponatremia Continue NS infusion. Follow-up sodium level in morning labs.    OSA (obstructive sleep apnea) Not on CPAP.    Macrocytic anemia Check B12 and folate level.   DVT prophylaxis: SCDs. Code Status: Full code. Family Communication: Delete Disposition Plan: Duration for syncopal episodes work-up. Consults called: Routine cardiology consult Admission status: Observation/telemetry.   Reubin Milan MD [facility]  If 7PM-7AM, please contact night-coverage [email or web address]  11/02/2019, 1:59 AM   This document was created using Dragon voice recognition software and may contain some unintended transcription errors.

## 2019-11-02 NOTE — ED Notes (Signed)
ED TO INPATIENT HANDOFF REPORT  ED Nurse Name and Phone #:Uldine Fuster, RN [phone]  S Name/Age/Gender Phillip Blackwell 59 y.o. male Room/Bed: APA06/APA06  Code Status   Code Status: Prior  Home/SNF/Other Home Patient oriented to: situation Is this baseline? Yes   Triage Complete: Triage complete  Chief Complaint Syncopal episodes [R55]  Triage Note Pt brought in by [facility] for c/o syncopal episode x 3 times tonight; pt states he was getting up from bed to go to kitchen and get something to eat and he passed out 3 times on the way to the kitchen; pt fell backwards onto the stove and broke the glass out of the stove; pt has a small laceration to the back of his head, minimal bleeding noted    Allergies No Known Allergies  Level of Care/Admitting Diagnosis ED Disposition    ED Disposition Condition [place]: [facility] [id number]  Level of Care: Telemetry [5]  Covid Evaluation: Asymptomatic Screening Protocol (No Symptoms)  Diagnosis: Syncopal episodes [id number]  Admitting Physician: Reubin Milan [id number]  Attending Physician: Reubin Milan [id number]       B Medical/Surgery History Past Medical History:  Diagnosis Date  . Anxiety   . Cancer ([place]) 2016  . Depression   . GERD (gastroesophageal reflux disease)   . Hypertension   . Rectal bleeding 07/07/2018  . Rectal ulceration   . Sleep apnea   . Stroke [facility])    2014   Past Surgical History:  Procedure Laterality Date  . BIOPSY  07/10/2018   Procedure: BIOPSY;  Surgeon: Otis Brace, MD;  Location: [place];  Service: Gastroenterology;;  . Consuela Mimes N/A 08/18/2018   Procedure: CYSTOSCOPY;  Surgeon: Alexis Frock, MD;  Location: [place];  Service: Urology;  Laterality: N/A;  . FLEXIBLE SIGMOIDOSCOPY N/A 07/10/2018   Procedure: FLEXIBLE SIGMOIDOSCOPY;  Surgeon: Otis Brace, MD;  Location: [place];  Service: Gastroenterology;  Laterality: N/A;  Adult EGD  Scope. Peds biopsy forceps.  Marland Kitchen FLEXIBLE SIGMOIDOSCOPY N/A 10/19/2018   Procedure: FLEXIBLE SIGMOIDOSCOPY;  Surgeon: Clarene Essex, MD;  Location: [place];  Service: Endoscopy;  Laterality: N/A;  . HERNIA REPAIR    . IRRIGATION AND DEBRIDEMENT ABSCESS N/A 08/18/2018   Procedure: IRRIGATION AND DEBRIDEMENT penal scrotal ABSCESS;  Surgeon: Alexis Frock, MD;  Location: [place];  Service: Urology;  Laterality: N/A;  . IRRIGATION AND DEBRIDEMENT ABSCESS N/A 09/01/2018   Procedure: IRRIGATION AND DEBRIDEMENT ABSCESS;  Surgeon: Alexis Frock, MD;  Location: [place];  Service: Urology;  Laterality: N/A;  . LAPAROSCOPIC PARTIAL COLECTOMY N/A 08/23/2018   Procedure: LAPAROSCOPIC  END COLOSTOMY CREATION;  Surgeon: Donnie Mesa, MD;  Location: [place];  Service: General;  Laterality: N/A;  . SCROTAL EXPLORATION N/A 08/23/2018   Procedure: SECONDARY PENILE/SCROTUM DEBRIDEMENT;  Surgeon: Alexis Frock, MD;  Location: [place];  Service: Urology;  Laterality: N/A;  . SCROTAL EXPLORATION N/A 08/26/2018   Procedure: THIRD STAGE PENIS AND SCROTAL IRRIGATION AND  DEBRIDEMENT;  Surgeon: Alexis Frock, MD;  Location: [place];  Service: Urology;  Laterality: N/A;     A IV Location/Drains/Wounds Patient Lines/Drains/Airways Status   Active Line/Drains/Airways    Name:   Placement date:   Placement time:   Site:   Days:   Peripheral IV 03/17/19 Left;Anterior Forearm   03/17/19    0046    Forearm   230   Peripheral IV 11/01/19 Right Antecubital   11/01/19    2331    Antecubital  1   Colostomy LLQ   08/23/18    1059    LLQ   436   Colostomy LLQ   --    --    LLQ      Wound / Incision (Open or Dehisced) 09/16/18 Other (Comment) Penis Circumferential I/D of Penis   09/16/18    0900    Penis   412   Wound / Incision (Open or Dehisced) 09/16/18 Incision - Open Scrotum Lower Posterior Scrotum   09/16/18    0900    Scrotum   412   Wound / Incision (Open or Dehisced) 09/16/18 Incision - Open Perineum Perineum   09/16/18     0900    Perineum   412   Wound / Incision (Open or Dehisced) 10/18/18 Other (Comment) Penis Anterior;Mid Round white boil   10/18/18    0100    Penis   380   Wound / Incision (Open or Dehisced) 03/14/19 Other (Comment) Perineum Mid;Posterior   03/14/19    1900    Perineum   233          Intake/Output Last 24 hours No intake or output data in the 24 hours ending 11/02/19 0121  Labs/Imaging Results for orders placed or performed during the hospital encounter of 11/01/19 (from the past 48 hour(s))  Troponin I (High Sensitivity)     Status: None   Collection Time: 11/01/19 11:26 PM  Result Value Ref Range   Troponin I (High Sensitivity) 7 <18 ng/L    Comment: (NOTE) Elevated high sensitivity troponin I (hsTnI) values and significant  changes across serial measurements may suggest ACS but many other  chronic and acute conditions are known to elevate hsTnI results.  Refer to the "Links" section for chest pain algorithms and additional  guidance. Performed at [facility], [address], [place], [place] [id number]   Basic metabolic panel     Status: Abnormal   Collection Time: 11/01/19 11:26 PM  Result Value Ref Range   Sodium 133 (L) 135 - 145 mmol/L   Potassium 3.5 3.5 - 5.1 mmol/L   Chloride 102 98 - 111 mmol/L   CO2 19 (L) 22 - 32 mmol/L   Glucose, Bld 103 (H) 70 - 99 mg/dL    Comment: Glucose reference range applies only to samples taken after fasting for at least 8 hours.   BUN 13 6 - 20 mg/dL   Creatinine, Ser 1.27 (H) 0.61 - 1.24 mg/dL   Calcium 8.1 (L) 8.9 - 10.3 mg/dL   GFR calc non Af Amer >60 >60 mL/min   GFR calc Af Amer >60 >60 mL/min   Anion gap 12 5 - 15    Comment: Performed at [facility], [address] [place], [place] [address]  CBC with Differential     Status: Abnormal   Collection Time: 11/01/19 11:26 PM  Result Value Ref Range   WBC 4.0 4.0 - 10.5 K/uL   RBC 3.46 (L) 4.22 - 5.81 MIL/uL   Hemoglobin 12.2 (L) 13.0 - 17.0 g/dL   HCT 37.0 (L) 39.0 - 52.0  %   MCV 106.9 (H) 80.0 - 100.0 fL   MCH 35.3 (H) 26.0 - 34.0 pg   MCHC 33.0 30.0 - 36.0 g/dL   RDW 11.9 11.5 - 15.5 %   Platelets 153 150 - 400 K/uL   nRBC 0.0 0.0 - 0.2 %   Neutrophils Relative % 59 %   Neutro Abs 2.4 1.7 - 7.7 K/uL  Lymphocytes Relative 21 %   Lymphs Abs 0.9 0.7 - 4.0 K/uL   Monocytes Relative 16 %   Monocytes Absolute 0.7 0.1 - 1.0 K/uL   Eosinophils Relative 3 %   Eosinophils Absolute 0.1 0.0 - 0.5 K/uL   Basophils Relative 1 %   Basophils Absolute 0.0 0.0 - 0.1 K/uL   Immature Granulocytes 0 %   Abs Immature Granulocytes 0.01 0.00 - 0.07 K/uL    Comment: Performed at [facility], [address] [place], [place] [address]   DG Chest 2 View  Result Date: 11/01/2019 CLINICAL DATA:  Syncope EXAM: CHEST - 2 VIEW COMPARISON:  March 13, 2019 FINDINGS: The heart size and mediastinal contours are within normal limits. Both lungs are clear. The visualized skeletal structures are unremarkable. IMPRESSION: No active cardiopulmonary disease. Electronically Signed   By: Prudencio Pair M.D.   On: 11/01/2019 23:54   CT Head Wo Contrast  Result Date: 11/02/2019 CLINICAL DATA:  Syncope, fall EXAM: CT HEAD WITHOUT CONTRAST TECHNIQUE: Contiguous axial images were obtained from the base of the skull through the vertex without intravenous contrast. COMPARISON:  None. FINDINGS: Brain: No acute intracranial abnormality. Specifically, no hemorrhage, hydrocephalus, mass lesion, acute infarction, or significant intracranial injury. Vascular: No hyperdense vessel or unexpected calcification. Skull: No acute calvarial abnormality. Sinuses/Orbits: Visualized paranasal sinuses and mastoids clear. Orbital soft tissues unremarkable. Other: None IMPRESSION: No acute intracranial abnormality. Electronically Signed   By: Rolm Baptise M.D.   On: 11/02/2019 00:07   CT Cervical Spine Wo Contrast  Result Date: 11/02/2019 CLINICAL DATA:  Syncope, fall EXAM: CT CERVICAL SPINE WITHOUT CONTRAST TECHNIQUE:  Multidetector CT imaging of the cervical spine was performed without intravenous contrast. Multiplanar CT image reconstructions were also generated. COMPARISON:  None. FINDINGS: Alignment: No subluxation. Skull base and vertebrae: No acute fracture. No primary bone lesion or focal pathologic process. Soft tissues and spinal canal: No prevertebral fluid or swelling. No visible canal hematoma. 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 Disc space narrowing and anterior spurring. Upper chest: No acute findings Other: None IMPRESSION: Degenerative changes as above.  No acute bony abnormality. Electronically Signed   By: Rolm Baptise M.D.   On: 11/02/2019 00:06    Pending Labs Unresulted Labs (From admission, onward)    Start     Ordered   11/02/19 0114  Magnesium  Add-on,   AD     11/02/19 0117          Vitals/Pain Today's Vitals   11/01/19 2311 11/01/19 2316  BP:  127/62  Pulse:  70  Resp:  19  Temp:  98.2 F (36.8 C)  TempSrc:  Oral  SpO2:  100%  Weight: 79.4 kg   Height: 6' (1.829 m)   PainSc: 6      Isolation Precautions No active isolations  Medications Medications  0.9 % NaCl with KCl 40 mEq / L  infusion (has no administration in time range)  Tdap (BOOSTRIX) injection 0.5 mL (0.5 mLs Intramuscular Given 11/02/19 0008)  sodium chloride 0.9 % bolus 1,000 mL (1,000 mLs Intravenous New Bag/Given 11/02/19 0106)    Mobility walks Low fall risk   Focused Assessments    R Recommendations: See Admitting Provider Note  Report given to:   Additional Notes: Pt has staples to back of head.

## 2020-04-12 IMAGING — DX PORTABLE CHEST - 1 VIEW
1 series · 1 of 1 positions shown · non-contrast
Comparison: 10/17/2018

CLINICAL DATA: Short of breath.

EXAM:
PORTABLE CHEST 1 VIEW

[chest ap]
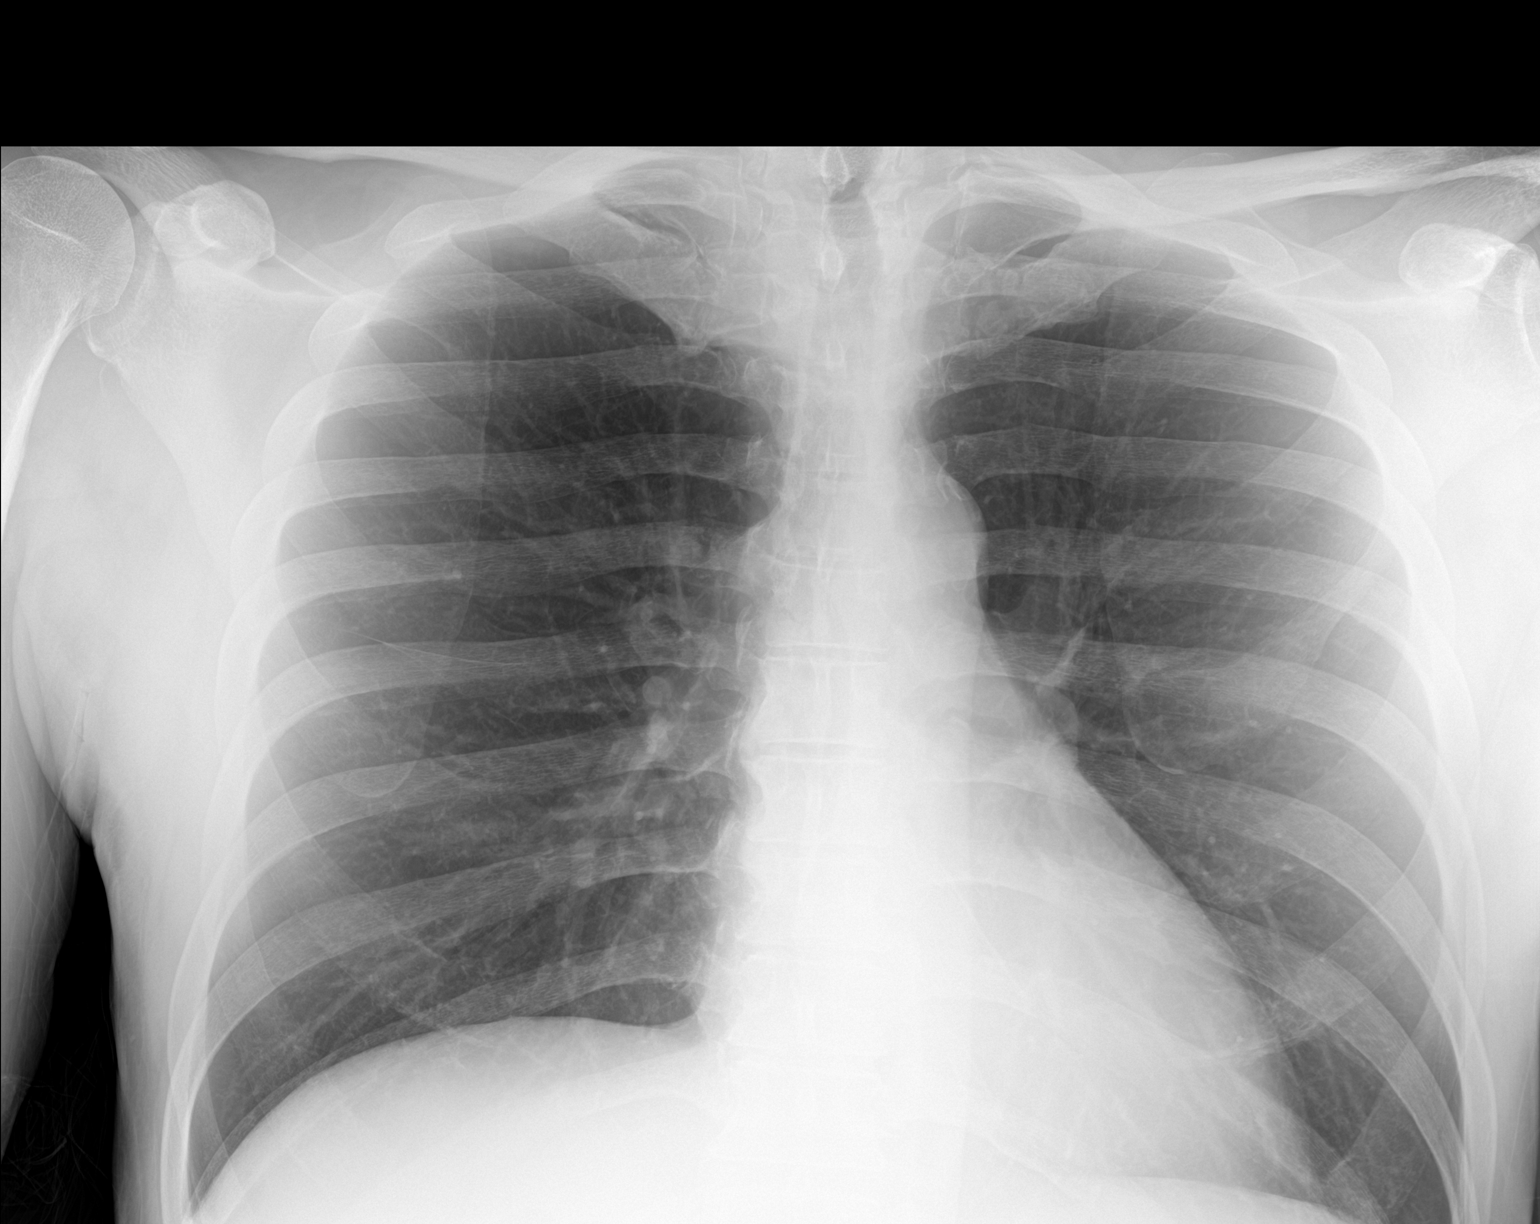

[1 of 1 positions shown; findings below may reference images not displayed]

FINDINGS: The heart size and mediastinal contours are within normal limits.
Both lungs are clear. The visualized skeletal structures are
unremarkable.
IMPRESSION: No active disease.

## 2020-04-12 IMAGING — CT CT ABDOMEN AND PELVIS WITHOUT CONTRAST
2 of 4 series · 16 of 46 positions shown, 18 images · non-contrast
Comparison: 08/13/2018

CLINICAL DATA: Evaluate for abdominal infection.

EXAM:
CT ABDOMEN AND PELVIS WITHOUT CONTRAST
TECHNIQUE: Multidetector CT imaging of the abdomen and pelvis was performed
following the standard protocol without IV contrast.

[Series 2: axial st · axial · 0.80mm/px · z∈[-487,-72]mm · 13 of 95 slices shown, 15 images]
[im 6/95  soft-tissue]
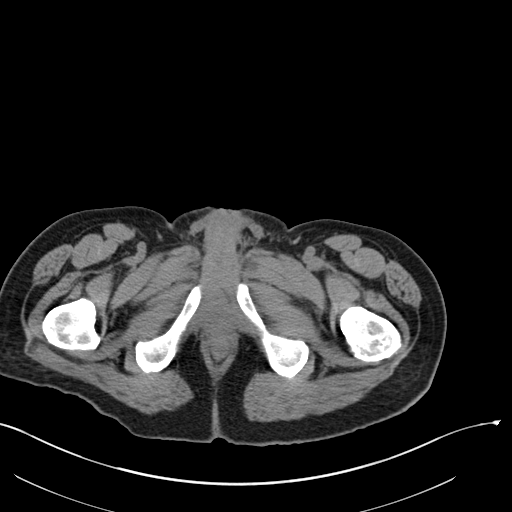
[im 6/95  bone]
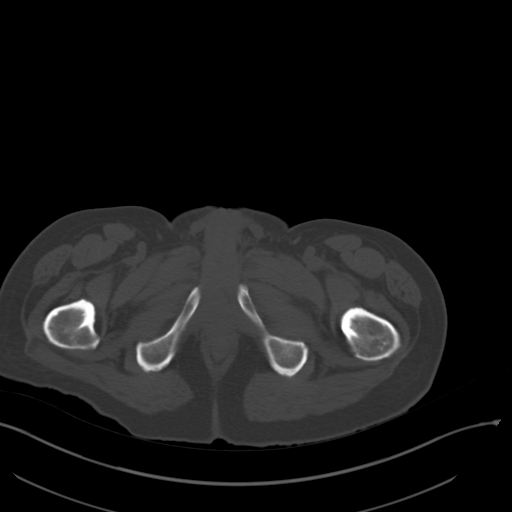
[im 12/95  soft-tissue]
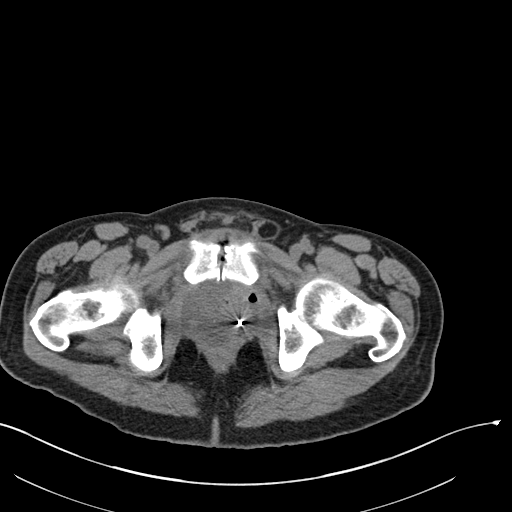
[im 23/95  soft-tissue]
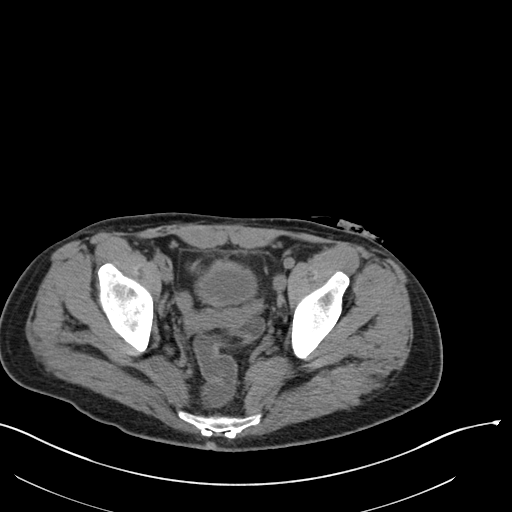
[im 28/95  soft-tissue]
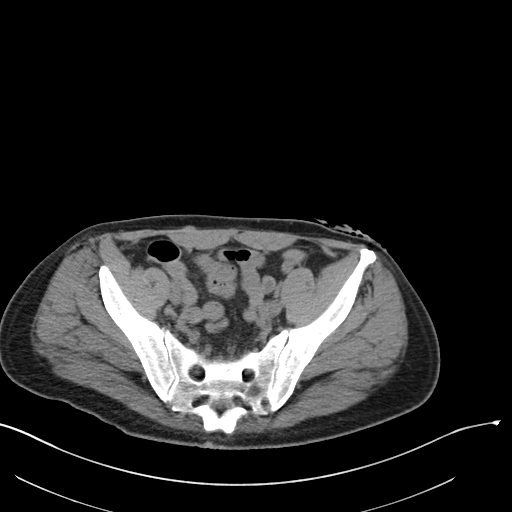
[im 34/95  soft-tissue]
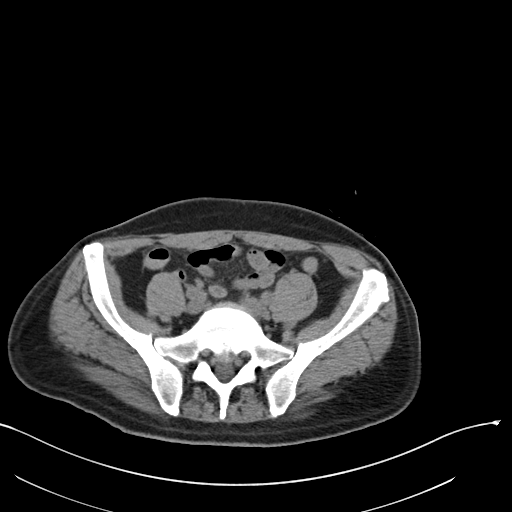
[im 39/95  soft-tissue]
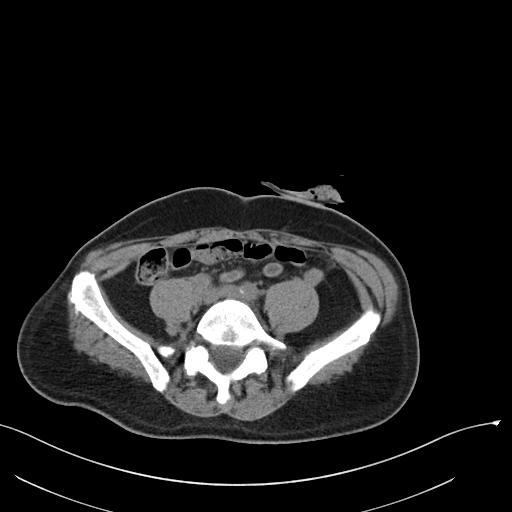
[im 50/95  soft-tissue]
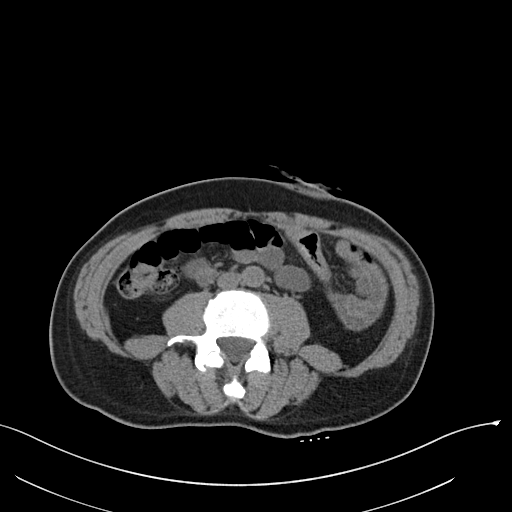
[im 56/95  soft-tissue]
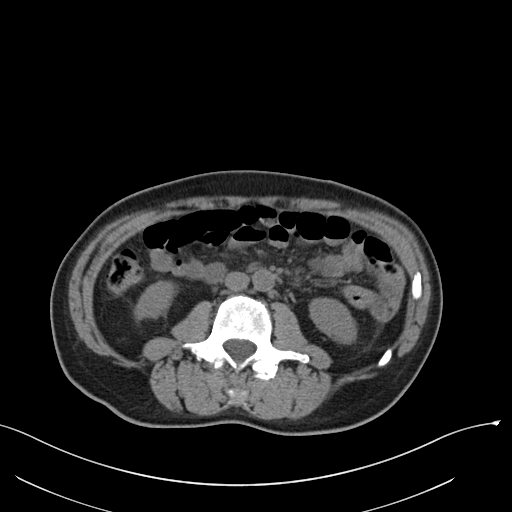
[im 61/95  soft-tissue]
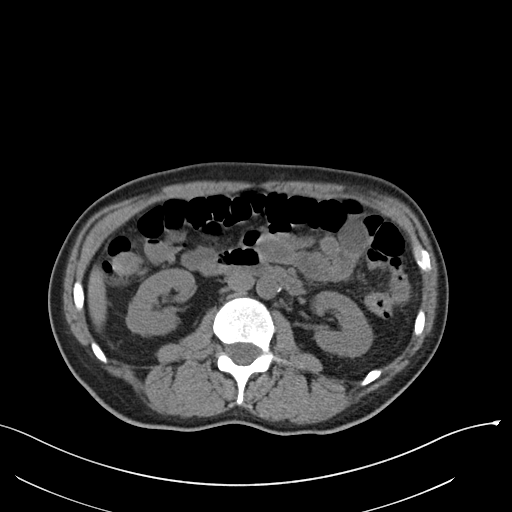
[im 61/95  bone]
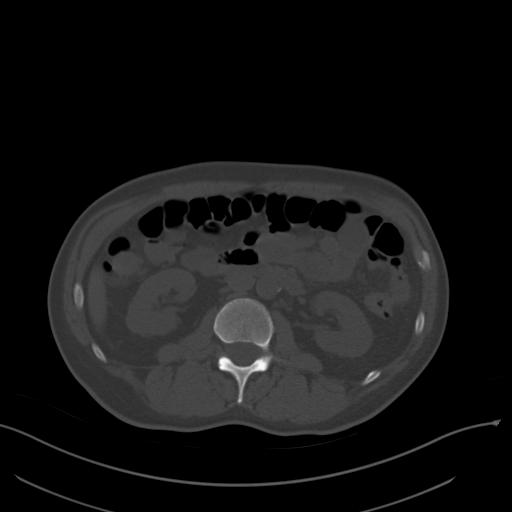
[im 67/95  soft-tissue]
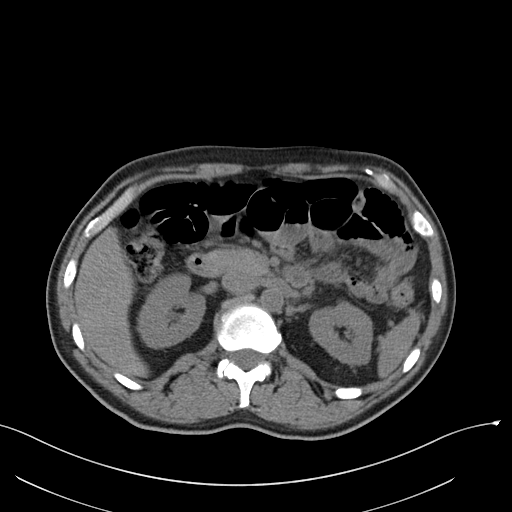
[im 72/95  soft-tissue]
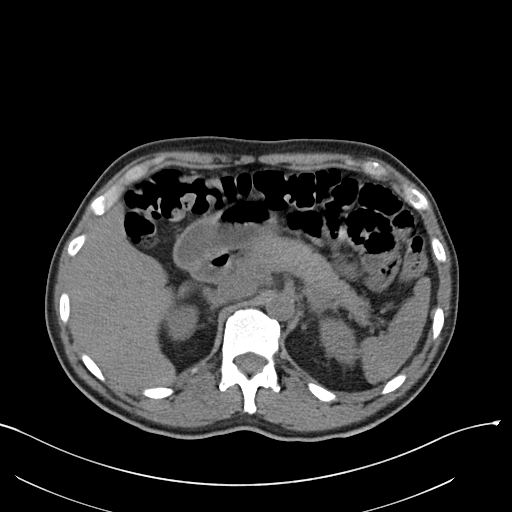
[im 83/95  soft-tissue]
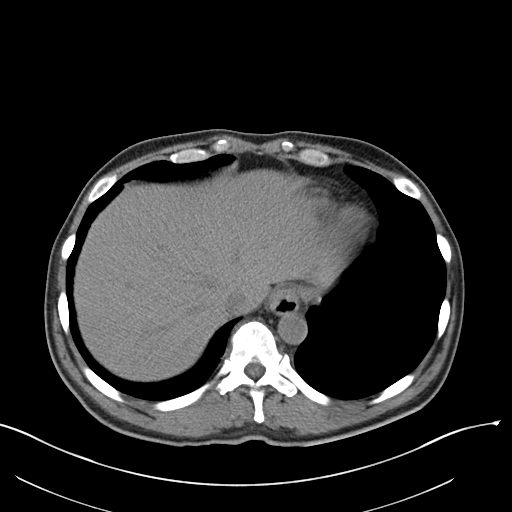
[im 89/95  soft-tissue]
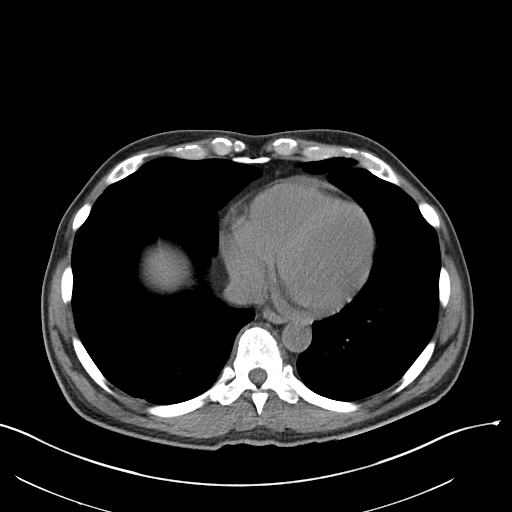

[Series 4: coronal st · coronal · 0.99mm/px · 3 of 124 slices shown]
[im 42/124  soft-tissue]
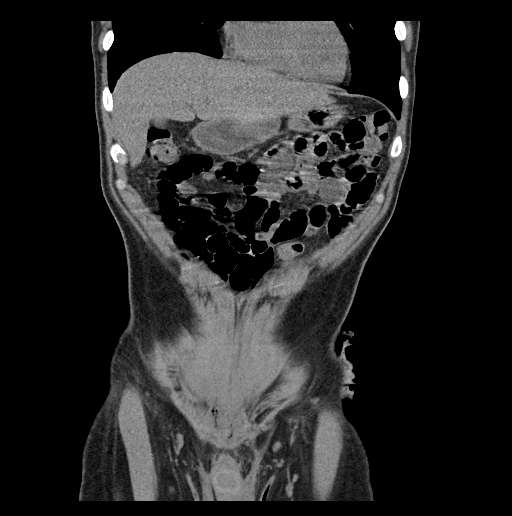
[im 55/124  soft-tissue]
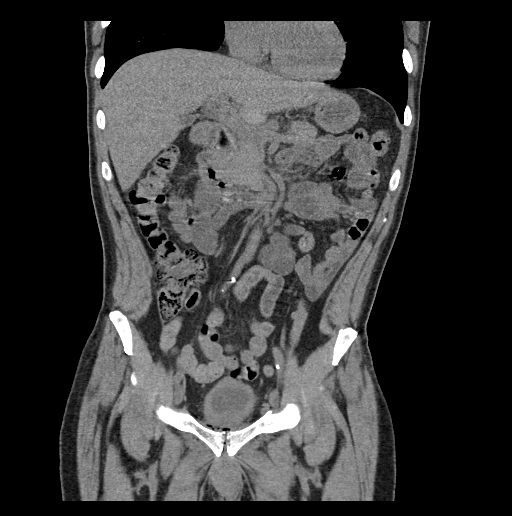
[im 69/124  soft-tissue]
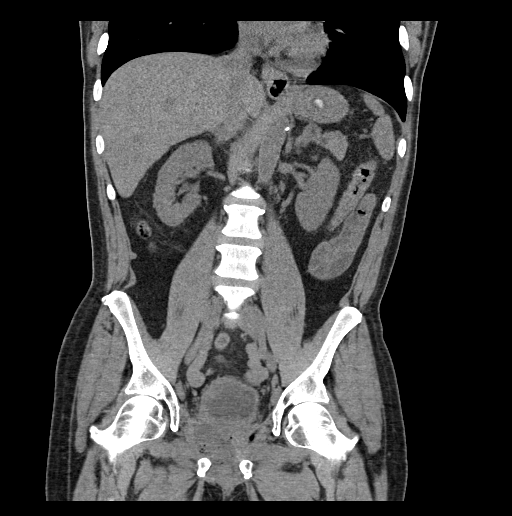

[16 of 46 positions shown; findings below may reference images not displayed]

FINDINGS: Lower chest: No acute abnormality.

Hepatobiliary: No focal liver abnormality is seen. No gallstones,
gallbladder wall thickening, or biliary dilatation.

Pancreas: Unremarkable. No pancreatic ductal dilatation or
surrounding inflammatory changes.

Spleen: Normal in size without focal abnormality.

Adrenals/Urinary Tract: Normal appearance of the adrenal glands. The
kidneys are normal. No mass or hydronephrosis. Diffuse bladder wall
thickening is identified, similar to the previous exam. Mild
pericystic fat stranding.

Stomach/Bowel: Stomach is normal. No bowel wall thickening,
distension or inflammation. Left lower quadrant colostomy.

Vascular/Lymphatic: Aortic atherosclerosis. No aneurysm. No enlarged
lymph nodes within the abdomen. Prominent left pelvic sidewall lymph
node measures 1 cm, image 75/2. Previously 0.8 cm.

Reproductive: Deep pelvic fluid collection involving the prostate
gland and surrounding soft tissues . This measures 4.4 by 5.9 by
cm, image 86/2 and image 78/4. The fluid collection and inflammatory
changes extend ventrally to involve the pubic symphysis. Although
limited due to lack of IV contrast material there appears to be
direct communication of this fluid collection with the floor of the
urinary bladder, image 64/5. Further, ventral extension to involve
the lower rectus musculature is identified and pubic symphysis
noted. Gas is now seen within the pubic symphysis with evidence of
cortical erosion compatible with osteomyelitis. Intramuscular gas is
now seen within the rectus musculature just above the pubic
symphysis reflecting myositis, necrotizing myositis not excluded.
Inflammatory changes continue ventrally with there is soft tissue
gas and thickening just above the base of penis is identified, image
79/5.

Other: None

Musculoskeletal: Slight widening with cortical erosions along the
articular surface of the pubic symphysis is identified consistent
with infectious/inflammatory arthropathy, image 85/2.
IMPRESSION: 1. Exam detail is diminished secondary to lack of IV contrast
material. Prostatic and periprostatic fluid collection is identified
within the floor of pelvis. Foci of gas are identified within this
fluid collection compatible with abscess abscess. This fluid
collection appears to directly communicate within the anterior floor
of the urinary bladder. There is also ventral extension of infection
to involve the pubic symphysis where there is evidence of
inflammatory arthropathy and possible osteomyelitis. Continued
ventral extension of inflammation involves the rectus musculature
where intramuscular gas is noted, cannot exclude necrotizing
myositis.
2. Bladder wall thickening and surrounding fat stranding compatible
with cystitis.

## 2020-09-06 ENCOUNTER — Other Ambulatory Visit (HOSPITAL_COMMUNITY): Payer: Self-pay | Admitting: Neurology

## 2020-09-06 ENCOUNTER — Other Ambulatory Visit: Payer: Self-pay | Admitting: Neurology

## 2020-09-06 DIAGNOSIS — R269 Unspecified abnormalities of gait and mobility: Secondary | ICD-10-CM

## 2020-09-06 DIAGNOSIS — G959 Disease of spinal cord, unspecified: Secondary | ICD-10-CM

## 2020-10-01 ENCOUNTER — Ambulatory Visit (HOSPITAL_COMMUNITY): Payer: No Typology Code available for payment source

## 2020-10-01 ENCOUNTER — Encounter (HOSPITAL_COMMUNITY): Payer: Self-pay

## 2020-10-09 ENCOUNTER — Ambulatory Visit (HOSPITAL_COMMUNITY): Payer: No Typology Code available for payment source

## 2020-10-26 ENCOUNTER — Ambulatory Visit (HOSPITAL_COMMUNITY): Payer: No Typology Code available for payment source

## 2021-08-12 DIAGNOSIS — Z043 Encounter for examination and observation following other accident: Secondary | ICD-10-CM | POA: Diagnosis not present

## 2021-08-12 DIAGNOSIS — R220 Localized swelling, mass and lump, head: Secondary | ICD-10-CM | POA: Diagnosis not present

## 2021-08-12 DIAGNOSIS — S299XXA Unspecified injury of thorax, initial encounter: Secondary | ICD-10-CM | POA: Diagnosis not present

## 2021-08-12 DIAGNOSIS — S0993XA Unspecified injury of face, initial encounter: Secondary | ICD-10-CM | POA: Diagnosis not present

## 2021-08-12 DIAGNOSIS — S3993XA Unspecified injury of pelvis, initial encounter: Secondary | ICD-10-CM | POA: Diagnosis not present

## 2021-08-13 DIAGNOSIS — B999 Unspecified infectious disease: Secondary | ICD-10-CM | POA: Diagnosis not present

## 2021-08-13 DIAGNOSIS — R109 Unspecified abdominal pain: Secondary | ICD-10-CM | POA: Diagnosis not present

## 2021-09-10 DIAGNOSIS — R296 Repeated falls: Secondary | ICD-10-CM | POA: Diagnosis not present

## 2021-09-10 DIAGNOSIS — I10 Essential (primary) hypertension: Secondary | ICD-10-CM | POA: Diagnosis not present

## 2021-09-10 DIAGNOSIS — R066 Hiccough: Secondary | ICD-10-CM | POA: Diagnosis not present

## 2021-09-10 DIAGNOSIS — F3160 Bipolar disorder, current episode mixed, unspecified: Secondary | ICD-10-CM | POA: Diagnosis not present

## 2021-09-10 DIAGNOSIS — N390 Urinary tract infection, site not specified: Secondary | ICD-10-CM | POA: Diagnosis not present

## 2021-09-10 DIAGNOSIS — M869 Osteomyelitis, unspecified: Secondary | ICD-10-CM | POA: Diagnosis not present

## 2021-09-12 DIAGNOSIS — I10 Essential (primary) hypertension: Secondary | ICD-10-CM | POA: Diagnosis not present

## 2021-09-12 DIAGNOSIS — M6281 Muscle weakness (generalized): Secondary | ICD-10-CM | POA: Diagnosis not present

## 2021-09-20 DIAGNOSIS — Z0389 Encounter for observation for other suspected diseases and conditions ruled out: Secondary | ICD-10-CM | POA: Diagnosis not present

## 2021-10-09 DIAGNOSIS — F32A Depression, unspecified: Secondary | ICD-10-CM | POA: Diagnosis not present

## 2021-10-09 DIAGNOSIS — I10 Essential (primary) hypertension: Secondary | ICD-10-CM | POA: Diagnosis not present

## 2021-10-09 DIAGNOSIS — G4730 Sleep apnea, unspecified: Secondary | ICD-10-CM | POA: Diagnosis not present

## 2021-10-09 DIAGNOSIS — Z8546 Personal history of malignant neoplasm of prostate: Secondary | ICD-10-CM | POA: Diagnosis not present

## 2021-10-09 DIAGNOSIS — E782 Mixed hyperlipidemia: Secondary | ICD-10-CM | POA: Diagnosis not present

## 2021-10-09 DIAGNOSIS — S72001D Fracture of unspecified part of neck of right femur, subsequent encounter for closed fracture with routine healing: Secondary | ICD-10-CM | POA: Diagnosis not present

## 2021-10-09 DIAGNOSIS — N321 Vesicointestinal fistula: Secondary | ICD-10-CM | POA: Diagnosis not present

## 2021-10-09 DIAGNOSIS — M8660 Other chronic osteomyelitis, unspecified site: Secondary | ICD-10-CM | POA: Diagnosis not present

## 2021-10-09 DIAGNOSIS — Z8673 Personal history of transient ischemic attack (TIA), and cerebral infarction without residual deficits: Secondary | ICD-10-CM | POA: Diagnosis not present

## 2021-11-21 DIAGNOSIS — R569 Unspecified convulsions: Secondary | ICD-10-CM | POA: Diagnosis not present

## 2021-11-21 DIAGNOSIS — R410 Disorientation, unspecified: Secondary | ICD-10-CM | POA: Diagnosis not present

## 2021-11-21 DIAGNOSIS — I700 Atherosclerosis of aorta: Secondary | ICD-10-CM | POA: Diagnosis not present

## 2021-11-21 DIAGNOSIS — S72001P Fracture of unspecified part of neck of right femur, subsequent encounter for closed fracture with malunion: Secondary | ICD-10-CM | POA: Diagnosis not present

## 2021-11-21 DIAGNOSIS — S72001D Fracture of unspecified part of neck of right femur, subsequent encounter for closed fracture with routine healing: Secondary | ICD-10-CM | POA: Diagnosis not present

## 2021-11-21 DIAGNOSIS — E871 Hypo-osmolality and hyponatremia: Secondary | ICD-10-CM | POA: Diagnosis not present

## 2021-11-21 DIAGNOSIS — B961 Klebsiella pneumoniae [K. pneumoniae] as the cause of diseases classified elsewhere: Secondary | ICD-10-CM | POA: Diagnosis not present

## 2021-11-21 DIAGNOSIS — F10129 Alcohol abuse with intoxication, unspecified: Secondary | ICD-10-CM | POA: Diagnosis not present

## 2021-11-21 DIAGNOSIS — S72399P Other fracture of shaft of unspecified femur, subsequent encounter for closed fracture with malunion: Secondary | ICD-10-CM | POA: Diagnosis not present

## 2021-11-21 DIAGNOSIS — S72001A Fracture of unspecified part of neck of right femur, initial encounter for closed fracture: Secondary | ICD-10-CM | POA: Diagnosis not present

## 2021-11-21 DIAGNOSIS — N3000 Acute cystitis without hematuria: Secondary | ICD-10-CM | POA: Diagnosis not present

## 2021-11-21 DIAGNOSIS — N390 Urinary tract infection, site not specified: Secondary | ICD-10-CM | POA: Diagnosis not present

## 2021-11-21 DIAGNOSIS — Z79899 Other long term (current) drug therapy: Secondary | ICD-10-CM | POA: Diagnosis not present

## 2021-11-21 DIAGNOSIS — W19XXXA Unspecified fall, initial encounter: Secondary | ICD-10-CM | POA: Diagnosis not present

## 2021-11-21 DIAGNOSIS — E872 Acidosis, unspecified: Secondary | ICD-10-CM | POA: Diagnosis not present

## 2021-11-21 DIAGNOSIS — Z043 Encounter for examination and observation following other accident: Secondary | ICD-10-CM | POA: Diagnosis not present

## 2021-11-21 DIAGNOSIS — D539 Nutritional anemia, unspecified: Secondary | ICD-10-CM | POA: Diagnosis not present

## 2021-11-21 DIAGNOSIS — Z20822 Contact with and (suspected) exposure to covid-19: Secondary | ICD-10-CM | POA: Diagnosis not present

## 2021-11-21 DIAGNOSIS — C61 Malignant neoplasm of prostate: Secondary | ICD-10-CM | POA: Diagnosis not present

## 2021-11-21 DIAGNOSIS — R519 Headache, unspecified: Secondary | ICD-10-CM | POA: Diagnosis not present

## 2021-11-21 DIAGNOSIS — F1090 Alcohol use, unspecified, uncomplicated: Secondary | ICD-10-CM | POA: Diagnosis not present

## 2021-11-21 DIAGNOSIS — D696 Thrombocytopenia, unspecified: Secondary | ICD-10-CM | POA: Diagnosis not present

## 2021-11-21 DIAGNOSIS — D649 Anemia, unspecified: Secondary | ICD-10-CM | POA: Diagnosis not present

## 2021-11-21 DIAGNOSIS — F19939 Other psychoactive substance use, unspecified with withdrawal, unspecified: Secondary | ICD-10-CM | POA: Diagnosis not present

## 2021-11-21 DIAGNOSIS — M86651 Other chronic osteomyelitis, right thigh: Secondary | ICD-10-CM | POA: Diagnosis not present

## 2021-11-21 DIAGNOSIS — R590 Localized enlarged lymph nodes: Secondary | ICD-10-CM | POA: Diagnosis not present

## 2021-11-21 DIAGNOSIS — F1010 Alcohol abuse, uncomplicated: Secondary | ICD-10-CM | POA: Diagnosis not present

## 2021-11-21 DIAGNOSIS — S7291XA Unspecified fracture of right femur, initial encounter for closed fracture: Secondary | ICD-10-CM | POA: Diagnosis not present

## 2021-11-21 DIAGNOSIS — K760 Fatty (change of) liver, not elsewhere classified: Secondary | ICD-10-CM | POA: Diagnosis not present

## 2021-11-21 DIAGNOSIS — S72009P Fracture of unspecified part of neck of unspecified femur, subsequent encounter for closed fracture with malunion: Secondary | ICD-10-CM | POA: Diagnosis not present

## 2021-11-21 DIAGNOSIS — I10 Essential (primary) hypertension: Secondary | ICD-10-CM | POA: Diagnosis not present

## 2021-11-21 DIAGNOSIS — S72391P Other fracture of shaft of right femur, subsequent encounter for closed fracture with malunion: Secondary | ICD-10-CM | POA: Diagnosis not present

## 2021-11-21 DIAGNOSIS — W1839XA Other fall on same level, initial encounter: Secondary | ICD-10-CM | POA: Diagnosis not present

## 2021-11-21 DIAGNOSIS — Z9049 Acquired absence of other specified parts of digestive tract: Secondary | ICD-10-CM | POA: Diagnosis not present

## 2021-11-21 DIAGNOSIS — Z792 Long term (current) use of antibiotics: Secondary | ICD-10-CM | POA: Diagnosis not present

## 2021-11-21 DIAGNOSIS — M8668 Other chronic osteomyelitis, other site: Secondary | ICD-10-CM | POA: Diagnosis not present

## 2021-11-21 DIAGNOSIS — F1020 Alcohol dependence, uncomplicated: Secondary | ICD-10-CM | POA: Diagnosis not present

## 2021-11-21 DIAGNOSIS — S72011A Unspecified intracapsular fracture of right femur, initial encounter for closed fracture: Secondary | ICD-10-CM | POA: Diagnosis not present

## 2022-01-31 DIAGNOSIS — R0602 Shortness of breath: Secondary | ICD-10-CM | POA: Diagnosis not present

## 2022-01-31 DIAGNOSIS — R200 Anesthesia of skin: Secondary | ICD-10-CM | POA: Diagnosis not present

## 2022-01-31 DIAGNOSIS — Z79899 Other long term (current) drug therapy: Secondary | ICD-10-CM | POA: Diagnosis not present

## 2022-01-31 DIAGNOSIS — R8290 Unspecified abnormal findings in urine: Secondary | ICD-10-CM | POA: Diagnosis not present

## 2022-01-31 DIAGNOSIS — I10 Essential (primary) hypertension: Secondary | ICD-10-CM | POA: Diagnosis not present

## 2022-03-11 DEATH — deceased
